# Patient Record
Sex: Male | Born: 1945
Health system: Southern US, Community
[De-identification: ages and names within clinical notes are randomized; demographics above are authoritative.]

## PROBLEM LIST (undated history)

## (undated) DIAGNOSIS — N433 Hydrocele, unspecified: Secondary | ICD-10-CM

## (undated) DIAGNOSIS — J439 Emphysema, unspecified: Secondary | ICD-10-CM

## (undated) DIAGNOSIS — K311 Adult hypertrophic pyloric stenosis: Secondary | ICD-10-CM

## (undated) DIAGNOSIS — I2510 Atherosclerotic heart disease of native coronary artery without angina pectoris: Secondary | ICD-10-CM

## (undated) DIAGNOSIS — I700 Atherosclerosis of aorta: Secondary | ICD-10-CM

## (undated) DIAGNOSIS — I712 Thoracic aortic aneurysm, without rupture: Secondary | ICD-10-CM

## (undated) DIAGNOSIS — E785 Hyperlipidemia, unspecified: Secondary | ICD-10-CM

## (undated) DIAGNOSIS — I499 Cardiac arrhythmia, unspecified: Secondary | ICD-10-CM

## (undated) DIAGNOSIS — I440 Atrioventricular block, first degree: Secondary | ICD-10-CM

## (undated) DIAGNOSIS — Z9289 Personal history of other medical treatment: Secondary | ICD-10-CM

## (undated) DIAGNOSIS — Z888 Allergy status to other drugs, medicaments and biological substances status: Secondary | ICD-10-CM

## (undated) DIAGNOSIS — K219 Gastro-esophageal reflux disease without esophagitis: Secondary | ICD-10-CM

## (undated) DIAGNOSIS — K5790 Diverticulosis of intestine, part unspecified, without perforation or abscess without bleeding: Secondary | ICD-10-CM

## (undated) DIAGNOSIS — I4891 Unspecified atrial fibrillation: Secondary | ICD-10-CM

## (undated) DIAGNOSIS — I480 Paroxysmal atrial fibrillation: Secondary | ICD-10-CM

## (undated) DIAGNOSIS — I219 Acute myocardial infarction, unspecified: Secondary | ICD-10-CM

## (undated) DIAGNOSIS — T4145XA Adverse effect of unspecified anesthetic, initial encounter: Secondary | ICD-10-CM

## (undated) DIAGNOSIS — I714 Abdominal aortic aneurysm, without rupture: Secondary | ICD-10-CM

## (undated) DIAGNOSIS — K3189 Other diseases of stomach and duodenum: Secondary | ICD-10-CM

## (undated) DIAGNOSIS — Z720 Tobacco use: Secondary | ICD-10-CM

## (undated) DIAGNOSIS — R911 Solitary pulmonary nodule: Secondary | ICD-10-CM

## (undated) DIAGNOSIS — C61 Malignant neoplasm of prostate: Secondary | ICD-10-CM

## (undated) DIAGNOSIS — K635 Polyp of colon: Secondary | ICD-10-CM

## (undated) HISTORY — DX: Atherosclerotic heart disease of native coronary artery without angina pectoris: I25.10

## (undated) HISTORY — DX: Hyperlipidemia, unspecified: E78.5

## (undated) HISTORY — DX: Unspecified atrial fibrillation: I48.91

## (undated) HISTORY — DX: Allergy status to other drugs, medicaments and biological substances: Z88.8

## (undated) HISTORY — DX: Hydrocele, unspecified: N43.3

## (undated) HISTORY — DX: Personal history of other medical treatment: Z92.89

## (undated) HISTORY — PX: TONSILLECTOMY: SUR1361

## (undated) HISTORY — DX: Paroxysmal atrial fibrillation: I48.0

## (undated) HISTORY — PX: CORONARY STENT PLACEMENT: SHX1402

## (undated) HISTORY — DX: Polyp of colon: K63.5

## (undated) HISTORY — DX: Tobacco use: Z72.0

---

## 2010-05-21 ENCOUNTER — Encounter: Payer: Self-pay | Admitting: Family Medicine

## 2010-07-14 HISTORY — PX: ATRIAL FIBRILLATION ABLATION: SHX5732

## 2010-08-13 HISTORY — PX: OTHER SURGICAL HISTORY: SHX169

## 2010-10-16 DIAGNOSIS — I259 Chronic ischemic heart disease, unspecified: Secondary | ICD-10-CM | POA: Insufficient documentation

## 2012-03-02 ENCOUNTER — Other Ambulatory Visit: Payer: Self-pay | Admitting: Family Medicine

## 2012-03-02 DIAGNOSIS — J989 Respiratory disorder, unspecified: Secondary | ICD-10-CM

## 2012-03-05 ENCOUNTER — Ambulatory Visit
Admission: RE | Admit: 2012-03-05 | Discharge: 2012-03-05 | Disposition: A | Discharge status: Home / Self Care | Payer: Medicare FFS | Source: Ambulatory Visit | Attending: Family Medicine | Admitting: Family Medicine

## 2012-03-05 DIAGNOSIS — J989 Respiratory disorder, unspecified: Secondary | ICD-10-CM

## 2012-05-12 ENCOUNTER — Other Ambulatory Visit: Payer: Self-pay | Admitting: *Deleted

## 2012-05-12 DIAGNOSIS — R9389 Abnormal findings on diagnostic imaging of other specified body structures: Secondary | ICD-10-CM

## 2012-05-31 NOTE — Telephone Encounter (Signed)
This encounter was created in error - please disregard.

## 2012-06-08 ENCOUNTER — Ambulatory Visit
Admission: RE | Admit: 2012-06-08 | Discharge: 2012-06-08 | Disposition: A | Discharge status: Home / Self Care | Payer: Medicare FFS | Source: Ambulatory Visit | Attending: Family Medicine | Admitting: Family Medicine

## 2012-06-08 DIAGNOSIS — R9389 Abnormal findings on diagnostic imaging of other specified body structures: Secondary | ICD-10-CM

## 2012-06-22 ENCOUNTER — Other Ambulatory Visit: Payer: Self-pay | Admitting: Family Medicine

## 2012-06-24 ENCOUNTER — Ambulatory Visit (INDEPENDENT_AMBULATORY_CARE_PROVIDER_SITE_OTHER): Payer: Medicare PPO | Admitting: Family Medicine

## 2012-06-24 ENCOUNTER — Encounter: Payer: Self-pay | Admitting: Family Medicine

## 2012-06-24 VITALS — BP 112/71 | HR 53 | Temp 97.9°F | Ht 68.5 in | Wt 169.2 lb

## 2012-06-24 DIAGNOSIS — R5383 Other fatigue: Secondary | ICD-10-CM

## 2012-06-24 DIAGNOSIS — E785 Hyperlipidemia, unspecified: Secondary | ICD-10-CM

## 2012-06-24 DIAGNOSIS — Z789 Other specified health status: Secondary | ICD-10-CM | POA: Insufficient documentation

## 2012-06-24 DIAGNOSIS — N400 Enlarged prostate without lower urinary tract symptoms: Secondary | ICD-10-CM

## 2012-06-24 DIAGNOSIS — N433 Hydrocele, unspecified: Secondary | ICD-10-CM

## 2012-06-24 DIAGNOSIS — I10 Essential (primary) hypertension: Secondary | ICD-10-CM

## 2012-06-24 DIAGNOSIS — I480 Paroxysmal atrial fibrillation: Secondary | ICD-10-CM | POA: Insufficient documentation

## 2012-06-24 LAB — HEPATIC FUNCTION PANEL
Albumin: 4.2 g/dL (ref 3.5–5.2)
Bilirubin, Direct: 0.1 mg/dL (ref 0.0–0.3)
Total Bilirubin: 0.4 mg/dL (ref 0.3–1.2)

## 2012-06-24 LAB — POCT CBC
Hemoglobin: 15 g/dL (ref 14.1–18.1)
Lymph, poc: 2.7 (ref 0.6–3.4)
MCH, POC: 32.6 pg — AB (ref 27–31.2)
MCV: 93 fL (ref 80–97)
Platelet Count, POC: 194 10*3/uL (ref 142–424)
RBC: 4.6 M/uL — AB (ref 4.69–6.13)

## 2012-06-24 LAB — POCT URINALYSIS DIPSTICK
Bilirubin, UA: NEGATIVE
Glucose, UA: NEGATIVE
Ketones, UA: NEGATIVE
Spec Grav, UA: 1.025
Urobilinogen, UA: NEGATIVE

## 2012-06-24 LAB — POCT UA - MICROSCOPIC ONLY

## 2012-06-24 LAB — LIPID PANEL
Cholesterol: 177 mg/dL (ref 0–200)
Total CHOL/HDL Ratio: 3.5 Ratio
VLDL: 16 mg/dL (ref 0–40)

## 2012-06-24 NOTE — Patient Instructions (Addendum)
Fall precautions discussed Continue current meds and therapeutic lifestyle changes We will schedule a visit with the urologist next door. We will plan to see you back in about 5 months.

## 2012-06-24 NOTE — Progress Notes (Signed)
  Subjective:    Patient ID: Dustin Bauer, male    DOB: 13-Aug-1945, 67 y.o.   MRN: [id number]  HPI Patient comes in today for followup of hyperlipidemia with statin intolerance, ASCVD, and BPH. Had PSA done in October of 2013. The value was 2.40 . Next colonoscopy will be due in June of 2018 due to family history of colon cancer. He will be seeing Dr. Orson Aloe his cardiologist sometime in July. He never was able to see the urologist regarding the hydrocele. He is not having quite as much problems with this, but we will go on and schedule one visit for followup and evaluation.   Review of Systems  Constitutional: Positive for fatigue.  HENT: Positive for postnasal drip (slight).   Respiratory: Positive for cough (dry, occasional). Negative for shortness of breath.   Cardiovascular: Negative.   Gastrointestinal: Negative.   Genitourinary: Negative.   Musculoskeletal: Positive for arthralgias. Gait problem: R elbow.  Allergic/Immunologic: Positive for environmental allergies (seasonal).  Psychiatric/Behavioral: Positive for sleep disturbance (trouble getting to sleep).       Objective:   Physical Exam BP 112/71  Pulse 53  Temp(Src) 97.9 F (36.6 C) (Oral)  Ht 5' 8.5" (1.74 m)  Wt 169 lb 3.2 oz (76.749 kg)  BMI 25.35 kg/m2  The patient appeared well nourished and normally developed, alert and oriented to time and place. Speech, behavior and judgement appear normal. Vital signs as documented.  Head exam is unremarkable. No scleral icterus or pallor noted. There is  some nasal congestion bilaterally. Throat mouth and ears all normal Neck is without jugular venous distension, thyromegally, or carotid bruits. Carotid upstrokes are brisk bilaterally. No cervical adenopathy. Lungs are clear anteriorly and posteriorly to auscultation. Normal respiratory effort. No axillary adenopathy. Cardiac exam reveals regular rate and rhythm at 60 per minute. First and second heart sounds normal.  No  murmurs, rubs or gallops.  Abdominal exam reveals normal bowl sounds, no masses, no organomegaly and no aortic enlargement. No inguinal adenopathy. There is no tenderness. Extremities are nonedematous and both femoral  are normal. Skin without pallor or jaundice.  Warm and dry, without rash. There is some bruising Neurologic exam reveals normal deep tendon reflexes and normal sensation.          Assessment & Plan:  1. Hyperlipemia - Hepatic function panel; Standing - Lipid panel  2. BPH (benign prostatic hypertrophy) - POCT urinalysis dipstick - POCT UA - Microscopic Only  3. Fatigue - POCT CBC; Standing - Vitamin D 25 hydroxy; Standing  4. Hypertension - POCT CBC; Standing - BASIC METABOLIC PANEL WITH GFR; Standing  5.[place] - Ambulatory referral to Urology  Patient Instructions  Fall precautions discussed Continue current meds and therapeutic lifestyle changes We will schedule a visit with the urologist next door. We will plan to see you back in about 5 months.   Nyra Capes MD

## 2012-06-25 ENCOUNTER — Telehealth: Payer: Self-pay | Admitting: Family Medicine

## 2012-06-25 LAB — BASIC METABOLIC PANEL WITH GFR
BUN: 18 mg/dL (ref 6–23)
CO2: 27 mEq/L (ref 19–32)
Chloride: 102 mEq/L (ref 96–112)
Creat: 1.05 mg/dL (ref 0.50–1.35)
GFR, Est Non African American: 73 mL/min
Glucose, Bld: 98 mg/dL (ref 70–99)

## 2012-06-25 LAB — VITAMIN D 25 HYDROXY (VIT D DEFICIENCY, FRACTURES): Vit D, 25-Hydroxy: 36 ng/mL (ref 30–89)

## 2012-06-29 NOTE — Telephone Encounter (Signed)
Pt notified of lab results and would wait on scheduling appt with tammy

## 2012-11-29 ENCOUNTER — Encounter: Payer: Self-pay | Admitting: Family Medicine

## 2012-11-29 ENCOUNTER — Encounter (INDEPENDENT_AMBULATORY_CARE_PROVIDER_SITE_OTHER): Payer: Self-pay

## 2012-11-29 ENCOUNTER — Ambulatory Visit (INDEPENDENT_AMBULATORY_CARE_PROVIDER_SITE_OTHER): Payer: Medicare PPO | Admitting: Family Medicine

## 2012-11-29 VITALS — BP 130/75 | HR 49 | Temp 96.8°F | Ht 68.5 in | Wt 171.2 lb

## 2012-11-29 DIAGNOSIS — Z23 Encounter for immunization: Secondary | ICD-10-CM

## 2012-11-29 DIAGNOSIS — I10 Essential (primary) hypertension: Secondary | ICD-10-CM

## 2012-11-29 DIAGNOSIS — E559 Vitamin D deficiency, unspecified: Secondary | ICD-10-CM

## 2012-11-29 DIAGNOSIS — E785 Hyperlipidemia, unspecified: Secondary | ICD-10-CM

## 2012-11-29 DIAGNOSIS — N400 Enlarged prostate without lower urinary tract symptoms: Secondary | ICD-10-CM

## 2012-11-29 DIAGNOSIS — R9389 Abnormal findings on diagnostic imaging of other specified body structures: Secondary | ICD-10-CM

## 2012-11-29 DIAGNOSIS — Z0000 Encounter for general adult medical examination without abnormal findings: Secondary | ICD-10-CM

## 2012-11-29 DIAGNOSIS — R5381 Other malaise: Secondary | ICD-10-CM

## 2012-11-29 DIAGNOSIS — R5383 Other fatigue: Secondary | ICD-10-CM

## 2012-11-29 DIAGNOSIS — N433 Hydrocele, unspecified: Secondary | ICD-10-CM

## 2012-11-29 LAB — POCT CBC
Granulocyte percent: 62.1 %G (ref 37–80)
HCT, POC: 45.6 % (ref 43.5–53.7)
Lymph, poc: 2.5 (ref 0.6–3.4)
MCHC: 33.8 g/dL (ref 31.8–35.4)
POC Granulocyte: 4.8 (ref 2–6.9)
POC LYMPH PERCENT: 32.4 %L (ref 10–50)
Platelet Count, POC: 202 10*3/uL (ref 142–424)
RDW, POC: 14.3 %

## 2012-11-29 NOTE — Patient Instructions (Addendum)
Continue current medications. Continue good therapeutic lifestyle changes which include good diet and exercise. Fall precautions discussed with patient. Schedule your flu vaccine if you haven't had it yet If you are over 67 years old - you may need Prevnar 13 or the adult Pneumonia vaccine. Return FOBT.Pneumococcal Vaccine, Polyvalent suspension for injection What is this medicine? PNEUMOCOCCAL VACCINE, POLYVALENT (NEU mo KOK al vak SEEN, pol ee VEY luhnt) is a vaccine to prevent pneumococcus bacteria infection. These bacteria are a major cause of ear infections, 'Strep throat' infections, and serious pneumonia, meningitis, or blood infections worldwide. These vaccines help the body to produce antibodies (protective substances) that help your body defend against these bacteria. This vaccine is recommended for infants and young children. This vaccine will not treat an infection. This medicine may be used for other purposes; ask your health care provider or pharmacist if you have questions. COMMON BRAND NAME(S): Prevnar 13 , Prevnar What should I tell my health care provider before I take this medicine? They need to know if you have any of these conditions: -bleeding problems -fever -immune system problems -low platelet count in the blood -seizures -an unusual or allergic reaction to pneumococcal vaccine, diphtheria toxoid, other vaccines, latex, other medicines, foods, dyes, or preservatives -pregnant or trying to get pregnant -breast-feeding How should I use this medicine? This vaccine is for injection into a muscle. It is given by a health care professional. A copy of Vaccine Information Statements will be given before each vaccination. Read this sheet carefully each time. The sheet may change frequently. Talk to your pediatrician regarding the use of this medicine in children. While this drug may be prescribed for children as young as 42 weeks old for selected conditions, precautions do  apply. Overdosage: If you think you have taken too much of this medicine contact a poison control center or emergency room at once. NOTE: This medicine is only for you. Do not share this medicine with others. What if I miss a dose? It is important not to miss your dose. Call your doctor or health care professional if you are unable to keep an appointment. What may interact with this medicine? -medicines for cancer chemotherapy -medicines that suppress your immune function -medicines that treat or prevent blood clots like warfarin, enoxaparin, and dalteparin -steroid medicines like prednisone or cortisone This list may not describe all possible interactions. Give your health care provider a list of all the medicines, herbs, non-prescription drugs, or dietary supplements you use. Also tell them if you smoke, drink alcohol, or use illegal drugs. Some items may interact with your medicine. What should I watch for while using this medicine? Mild fever and pain should go away in 3 days or less. Report any unusual symptoms to your doctor or health care professional. What side effects may I notice from receiving this medicine? Side effects that you should report to your doctor or health care professional as soon as possible: -allergic reactions like skin rash, itching or hives, swelling of the face, lips, or tongue -breathing problems -confused -fever over 102 degrees F -pain, tingling, numbness in the hands or feet -seizures -unusual bleeding or bruising -unusual muscle weakness Side effects that usually do not require medical attention (report to your doctor or health care professional if they continue or are bothersome): -aches and pains -diarrhea -fever of 102 degrees F or less -headache -irritable -loss of appetite -pain, tender at site where injected -trouble sleeping This list may not describe all possible side effects. Call  your doctor for medical advice about side effects. You may  report side effects to FDA at 1-800-FDA-1088. Where should I keep my medicine? This does not apply. This vaccine is given in a clinic, pharmacy, doctor's office, or other health care setting and will not be stored at home. NOTE: This sheet is a summary. It may not cover all possible information. If you have questions about this medicine, talk to your doctor, pharmacist, or health care provider.  2014, Elsevier/Gold Standard. (2008-03-14 10:17:22) Influenza Virus Vaccine injection What is this medicine? INFLUENZA VIRUS VACCINE (in floo EN zuh VAHY ruhs vak SEEN) helps to reduce the risk of getting influenza also known as the flu. The vaccine only helps protect you against some strains of the flu. This medicine may be used for other purposes; ask your health care provider or pharmacist if you have questions. COMMON BRAND NAME(S): Afluria , Agriflu, Fluarix Quadrivalent, Fluarix, FLUCELVAX, Flulaval, Fluvirin, Fluzone High-Dose, Fluzone Intradermal, Fluzone What should I tell my health care provider before I take this medicine? They need to know if you have any of these conditions: -bleeding disorder like hemophilia -fever or infection -Guillain-Barre syndrome or other neurological problems -immune system problems -infection with the human immunodeficiency virus (HIV) or AIDS -low blood platelet counts -multiple sclerosis -an unusual or allergic reaction to influenza virus vaccine, latex, other medicines, foods, dyes, or preservatives. Different brands of vaccines contain different allergens. Some may contain latex or eggs. Talk to your doctor about your allergies to make sure that you get the right vaccine. -pregnant or trying to get pregnant -breast-feeding How should I use this medicine? This vaccine is for injection into a muscle or under the skin. It is given by a health care professional. A copy of Vaccine Information Statements will be given before each vaccination. Read this sheet  carefully each time. The sheet may change frequently. Talk to your healthcare provider to see which vaccines are right for you. Some vaccines should not be used in all age groups. Overdosage: If you think you have taken too much of this medicine contact a poison control center or emergency room at once. NOTE: This medicine is only for you. Do not share this medicine with others. What if I miss a dose? This does not apply. What may interact with this medicine? -chemotherapy or radiation therapy -medicines that lower your immune system like etanercept, anakinra, infliximab, and adalimumab -medicines that treat or prevent blood clots like warfarin -phenytoin -steroid medicines like prednisone or cortisone -theophylline -vaccines This list may not describe all possible interactions. Give your health care provider a list of all the medicines, herbs, non-prescription drugs, or dietary supplements you use. Also tell them if you smoke, drink alcohol, or use illegal drugs. Some items may interact with your medicine. What should I watch for while using this medicine? Report any side effects that do not go away within 3 days to your doctor or health care professional. Call your health care provider if any unusual symptoms occur within 6 weeks of receiving this vaccine. You may still catch the flu, but the illness is not usually as bad. You cannot get the flu from the vaccine. The vaccine will not protect against colds or other illnesses that may cause fever. The vaccine is needed every year. What side effects may I notice from receiving this medicine? Side effects that you should report to your doctor or health care professional as soon as possible: -allergic reactions like skin rash, itching or hives, swelling  of the face, lips, or tongue Side effects that usually do not require medical attention (report to your doctor or health care professional if they continue or are  bothersome): -fever -headache -muscle aches and pains -pain, tenderness, redness, or swelling at the injection site -tiredness This list may not describe all possible side effects. Call your doctor for medical advice about side effects. You may report side effects to FDA at 1-800-FDA-1088. Where should I keep my medicine? The vaccine will be given by a health care professional in a clinic, pharmacy, doctor's office, or other health care setting. You will not be given vaccine doses to store at home. NOTE: This sheet is a summary. It may not cover all possible information. If you have questions about this medicine, talk to your doctor, pharmacist, or health care provider.  2014, Elsevier/Gold Standard. (2011-07-10 04:54:09)

## 2012-11-29 NOTE — Addendum Note (Signed)
Addended by: Prescott Gum on: 11/29/2012 04:39 PM   Modules accepted: Orders

## 2012-11-29 NOTE — Addendum Note (Signed)
Addended by: Prescott Gum on: 11/29/2012 04:33 PM   Modules accepted: Orders

## 2012-11-29 NOTE — Addendum Note (Signed)
Addended by: Bearl Mulberry on: 11/29/2012 04:34 PM   Modules accepted: Orders

## 2012-11-29 NOTE — Progress Notes (Signed)
Subjective:    Patient ID: Dustin OROURKE, male    DOB: 02-Jan-1946, 67 y.o.   MRN: [id number]  HPI Pt here for follow up and management of chronic medical problems. He is also here for his annual exam. He is still followed by a cardiologist at [facility]. He also is being followed for an abnormal chest CT and this scan will be repeated in June of 2015.   Review of Systems  Constitutional: Positive for fatigue (slight). Negative for activity change and appetite change.  HENT: Negative for congestion, postnasal drip, rhinorrhea and sinus pressure.   Eyes: Positive for redness (intermitent) and itching (intermitent).  Respiratory: Negative for cough, shortness of breath and wheezing.   Cardiovascular: Negative for chest pain, palpitations and leg swelling.  Gastrointestinal: Negative for abdominal pain, diarrhea, constipation and blood in stool.  Endocrine: Negative.  Negative for cold intolerance, heat intolerance and polyuria.  Genitourinary: Negative for dysuria, urgency and frequency.  Musculoskeletal: Positive for arthralgias (occasional). Negative for back pain and myalgias.  Allergic/Immunologic: Positive for environmental allergies.  Neurological: Positive for light-headedness (occasional with positioning). Negative for dizziness, tremors, weakness and headaches.  Psychiatric/Behavioral: Negative for confusion, sleep disturbance and decreased concentration. The patient is not nervous/anxious.        Objective:   Physical Exam  Nursing note and vitals reviewed. Constitutional: He is oriented to person, place, and time. He appears well-developed and well-nourished. No distress.  HENT:  Head: Normocephalic and atraumatic.  Right Ear: External ear normal.  Left Ear: External ear normal.  Nose: Nose normal.  Mouth/Throat: Oropharynx is clear and moist. No oropharyngeal exudate.  Nasal congestion bilaterally  Eyes: Conjunctivae and EOM are normal. Pupils are  equal, round, and reactive to light. Right eye exhibits no discharge. Left eye exhibits no discharge. No scleral icterus.  Neck: Normal range of motion. Neck supple. No tracheal deviation present. No thyromegaly present.  No carotid bruits  Cardiovascular: Normal rate, regular rhythm, normal heart sounds and intact distal pulses.  Exam reveals no gallop and no friction rub.   No murmur heard. At 50-60 per minute  Pulmonary/Chest: Effort normal and breath sounds normal. No respiratory distress. He has no wheezes. He has no rales. He exhibits no tenderness.  Abdominal: Soft. Bowel sounds are normal. He exhibits no mass. There is no tenderness. There is no rebound and no guarding.  Genitourinary: Rectum normal and penis normal.   Prostate is enlarged and smooth without any lumps. There no rectal masses. There is a left hydrocele.  Musculoskeletal: Normal range of motion. He exhibits no edema and no tenderness.  Lymphadenopathy:    He has no cervical adenopathy.  Neurological: He is alert and oriented to person, place, and time. He has normal reflexes. No cranial nerve deficit.  Skin: Skin is warm and dry. No rash noted. No erythema. No pallor.  There are some AK's on the face and he has a small skin tear on the left hand   Psychiatric: He has a normal mood and affect. His behavior is normal. Judgment and thought content normal.   BP 130/75  Pulse 49  Temp(Src) 96.8 F (36 C) (Oral)  Ht 5' 8.5" (1.74 m)  Wt 171 lb 3.2 oz (77.656 kg)  BMI 25.65 kg/m2        Assessment & Plan:   1. Hyperlipemia   2. BPH (benign prostatic hypertrophy)   3. Hypertension   4. Hydrocele   5. Abnormal chest CT  Patient will have all labs drawn today plus a urinalysis  Meds ordered this encounter  Medications  . ketoconazole (NIZORAL) 2 % cream    Sig:   . NITROSTAT 0.4 MG SL tablet    Sig:    Patient Instructions  Continue current medications. Continue good therapeutic lifestyle changes which  include good diet and exercise. Fall precautions discussed with patient. Schedule your flu vaccine if you haven't had it yet If you are over 91 years old - you may need Prevnar 13 or the adult Pneumonia vaccine. Return FOBT.   Remember that you're next chest CT is reviewed in late May of 2015  Nyra Capes MD

## 2012-11-30 LAB — BMP8+EGFR
CO2: 26 mmol/L (ref 18–29)
Chloride: 101 mmol/L (ref 97–108)
GFR calc Af Amer: 89 mL/min/{1.73_m2} (ref >59)
Glucose: 90 mg/dL (ref 65–99)
Potassium: 4.5 mmol/L (ref 3.5–5.2)

## 2012-11-30 LAB — PSA, TOTAL AND FREE
PSA, Free Pct: 16.2 %
PSA, Free: 0.55 ng/mL
PSA: 3.4 ng/mL (ref 0.0–4.0)

## 2012-11-30 LAB — HEPATIC FUNCTION PANEL
AST: 22 IU/L (ref 0–40)
Alkaline Phosphatase: 83 IU/L (ref 39–117)
Total Protein: 6.5 g/dL (ref 6.0–8.5)

## 2012-11-30 LAB — LIPID PANEL
Cholesterol, Total: 176 mg/dL (ref 100–199)
LDL Calculated: 108 mg/dL — ABNORMAL HIGH (ref 0–99)
Triglycerides: 84 mg/dL (ref 0–149)

## 2012-11-30 LAB — VITAMIN D 25 HYDROXY (VIT D DEFICIENCY, FRACTURES): Vit D, 25-Hydroxy: 25.2 ng/mL — ABNORMAL LOW (ref 30.0–100.0)

## 2012-12-15 ENCOUNTER — Telehealth: Payer: Self-pay | Admitting: *Deleted

## 2012-12-15 NOTE — Telephone Encounter (Signed)
PSA this time was 3.4. A year ago it was 2.4. This is more than 0.75 rise. Repeat PSA in one week and get urinalysis to verify the rate of rise and to make sure there is no sign of any urinary tract infection

## 2012-12-15 NOTE — Telephone Encounter (Signed)
Message copied by Magdalene River on Wed Dec 15, 2012 11:19 AM ------      Message from: Ernestina Penna      Created: Sat Dec 04, 2012  8:01 AM       Make sure that the patient is aware of these results and that we confirm what the previous PSA was from the previous drawl in the paper chart      ----- Message -----         From: SYSTEM         Sent: 12/04/2012  12:10 AM           To: Ernestina Penna, MD                   ------

## 2012-12-15 NOTE — Telephone Encounter (Signed)
Message copied by Baltazar Apo on Wed Dec 15, 2012  3:59 PM ------      Message from: Ernestina Penna      Created: Tue Nov 30, 2012  2:12 PM       The PSA is 3.4-------- please check the paper chart for the previous PSA reading       on a traditional lipid panel the LDL C. Was 108, 5 months ago it was 110,this number should be less than 100. Triglycerides were good at 84 the HDL was the same as it was 5 months ago at 51      Blood sugar renal and electrolytes were all good      LFTs were within normal limit      Vitamin D was low at 25.2. Start vitamin D 50,000 units weekly for 12 weeks with 1 refill, recheck vitamin D in 3 months ------

## 2012-12-15 NOTE — Telephone Encounter (Signed)
Pt notified and verbalized understanding.

## 2012-12-15 NOTE — Telephone Encounter (Signed)
Last PSA was done 11-06-11 and it was 2.40 DWM to address

## 2012-12-16 ENCOUNTER — Other Ambulatory Visit (INDEPENDENT_AMBULATORY_CARE_PROVIDER_SITE_OTHER): Payer: Medicare PPO

## 2012-12-16 DIAGNOSIS — N390 Urinary tract infection, site not specified: Secondary | ICD-10-CM

## 2012-12-16 DIAGNOSIS — Z125 Encounter for screening for malignant neoplasm of prostate: Secondary | ICD-10-CM

## 2012-12-16 LAB — POCT URINALYSIS DIPSTICK
Bilirubin, UA: NEGATIVE
Nitrite, UA: NEGATIVE
Protein, UA: NEGATIVE
pH, UA: 5

## 2012-12-16 LAB — POCT UA - MICROSCOPIC ONLY
Casts, Ur, LPF, POC: NEGATIVE
Yeast, UA: NEGATIVE

## 2012-12-16 NOTE — Addendum Note (Signed)
Addended by: Prescott Gum on: 12/16/2012 04:11 PM   Modules accepted: Orders

## 2012-12-16 NOTE — Progress Notes (Signed)
Pt came in for labs only 

## 2012-12-17 LAB — PSA, TOTAL AND FREE
PSA, Free Pct: 11.4 %
PSA, Free: 0.4 ng/mL
PSA: 3.5 ng/mL (ref 0.0–4.0)

## 2012-12-20 ENCOUNTER — Other Ambulatory Visit: Payer: Self-pay

## 2012-12-20 DIAGNOSIS — R972 Elevated prostate specific antigen [PSA]: Secondary | ICD-10-CM

## 2012-12-22 ENCOUNTER — Telehealth: Payer: Self-pay | Admitting: Family Medicine

## 2013-04-19 ENCOUNTER — Telehealth: Payer: Self-pay | Admitting: Family Medicine

## 2013-04-19 DIAGNOSIS — M25521 Pain in right elbow: Secondary | ICD-10-CM

## 2013-04-19 NOTE — Telephone Encounter (Signed)
Please take care of this referral note for this patient and call him and let him know this has been done

## 2013-04-20 NOTE — Telephone Encounter (Signed)
Pt aware referral has been placed.

## 2013-05-30 ENCOUNTER — Ambulatory Visit (INDEPENDENT_AMBULATORY_CARE_PROVIDER_SITE_OTHER): Payer: Medicare PPO | Admitting: Family Medicine

## 2013-05-30 ENCOUNTER — Encounter: Payer: Self-pay | Admitting: Family Medicine

## 2013-05-30 VITALS — BP 126/77 | HR 58 | Temp 98.3°F | Ht 68.5 in | Wt 170.0 lb

## 2013-05-30 DIAGNOSIS — I10 Essential (primary) hypertension: Secondary | ICD-10-CM

## 2013-05-30 DIAGNOSIS — R9389 Abnormal findings on diagnostic imaging of other specified body structures: Secondary | ICD-10-CM

## 2013-05-30 DIAGNOSIS — I4891 Unspecified atrial fibrillation: Secondary | ICD-10-CM

## 2013-05-30 DIAGNOSIS — E559 Vitamin D deficiency, unspecified: Secondary | ICD-10-CM

## 2013-05-30 DIAGNOSIS — R972 Elevated prostate specific antigen [PSA]: Secondary | ICD-10-CM

## 2013-05-30 DIAGNOSIS — E785 Hyperlipidemia, unspecified: Secondary | ICD-10-CM

## 2013-05-30 DIAGNOSIS — I7090 Unspecified atherosclerosis: Secondary | ICD-10-CM

## 2013-05-30 DIAGNOSIS — I2510 Atherosclerotic heart disease of native coronary artery without angina pectoris: Secondary | ICD-10-CM

## 2013-05-30 DIAGNOSIS — R7989 Other specified abnormal findings of blood chemistry: Secondary | ICD-10-CM

## 2013-05-30 LAB — POCT CBC
Granulocyte percent: 69.1 %G (ref 37–80)
HCT, POC: 44.2 % (ref 43.5–53.7)
Hemoglobin: 13.7 g/dL — AB (ref 14.1–18.1)
LYMPH, POC: 2.4 (ref 0.6–3.4)
MCH: 29.3 pg (ref 27–31.2)
MCHC: 31 g/dL — AB (ref 31.8–35.4)
MCV: 94.4 fL (ref 80–97)
MPV: 7.6 fL (ref 0–99.8)
POC GRANULOCYTE: 6.4 (ref 2–6.9)
POC LYMPH %: 26.4 % (ref 10–50)
Platelet Count, POC: 197 10*3/uL (ref 142–424)
RBC: 4.7 M/uL (ref 4.69–6.13)
RDW, POC: 14.7 %
WBC: 9.2 10*3/uL (ref 4.6–10.2)

## 2013-05-30 MED ORDER — HYDROCHLOROTHIAZIDE 12.5 MG PO CAPS: 12.5000 mg | ORAL_CAPSULE | Freq: Every day | ORAL | Status: DC

## 2013-05-30 MED ORDER — EZETIMIBE 10 MG PO TABS: ORAL_TABLET | ORAL | Status: DC

## 2013-05-30 NOTE — Addendum Note (Signed)
Addended by: Earlene Plater on: 05/30/2013 05:15 PM   Modules accepted: Orders

## 2013-05-30 NOTE — Patient Instructions (Addendum)
Medicare Annual Wellness Visit  [facility] and the medical providers at [facility] strive to bring you the best medical care.  In doing so we not only want to address your current medical conditions and concerns but also to detect new conditions early and prevent illness, disease and health-related problems.    Medicare offers a yearly Wellness Visit which allows our clinical staff to assess your need for preventative services including immunizations, lifestyle education, counseling to decrease risk of preventable diseases and screening for fall risk and other medical concerns.    This visit is provided free of charge (no copay) for all Medicare recipients. The clinical pharmacists at [facility] have begun to conduct these Wellness Visits which will also include a thorough review of all your medications.    As you primary medical provider recommend that you make an appointment for your Annual Wellness Visit if you have not done so already this year.  You may set up this appointment before you leave today or you may call back ([phone]) and schedule an appointment.  Please make sure when you call that you mention that you are scheduling your Annual Wellness Visit with the clinical pharmacist so that the appointment may be made for the proper length of time.       Continue current medications. Continue good therapeutic lifestyle changes which include good diet and exercise. Fall precautions discussed with patient. If an FOBT was given today- please return it to our front desk. If you are over 58 years old - you may need Prevnar 46 or the adult Pneumonia vaccine.  Keep followup appointment with urologist and cardiologist Return the FOBT will call other lab work once those results are available We will also schedule you for your repeat CT scan of the lungs because of the history of pulmonary nodule

## 2013-05-30 NOTE — Progress Notes (Signed)
Subjective:    Patient ID: Dustin Bauer, male    DOB: 1945-06-12, 68 y.o.   MRN: [id number]  HPI Pt here for follow up and management of chronic medical problems. The patient is doing well overall. He is due to get a followup CT scan of his lungs because of a recent one year ago abnormal CT scan with a pulmonary nodule. He also is followed by the urologist because of an elevation of his PSA from one year to the next. He'll get his blood work done today. He will need to return an FOBT.         Patient Active Problem List   Diagnosis Date Noted  . Abnormal chest CT 05/30/2013  . Elevated PSA 05/30/2013  . ASCVD (arteriosclerotic cardiovascular disease) 06/24/2012  . Hyperlipidemia 06/24/2012  . Atrial fibrillation,history of 06/24/2012  . Statin intolerance 06/24/2012   Outpatient Encounter Prescriptions as of 05/30/2013  Medication Sig  . aspirin (SB LOW DOSE ASA EC) 81 MG EC tablet Take 162 mg by mouth daily.   Marland Kitchen ezetimibe (ZETIA) 10 MG tablet TAKE 1 TABLETS ONCE A DAY  . hydrochlorothiazide (MICROZIDE) 12.5 MG capsule Take 1 capsule (12.5 mg total) by mouth daily.  . [DISCONTINUED] hydrochlorothiazide (MICROZIDE) 12.5 MG capsule Take 12.5 mg by mouth daily.  . [DISCONTINUED] ZETIA 10 MG tablet TAKE 1 TABLETS ONCE A DAY  . ketoconazole (NIZORAL) 2 % cream   . NITROSTAT 0.4 MG SL tablet     Review of Systems  Constitutional: Negative.   HENT: Negative.   Eyes: Negative.   Respiratory: Negative.   Cardiovascular: Negative.   Gastrointestinal: Negative.   Endocrine: Negative.   Genitourinary: Negative.   Musculoskeletal: Negative.   Skin: Negative.   Allergic/Immunologic: Negative.   Neurological: Negative.   Hematological: Negative.   Psychiatric/Behavioral: Negative.        Objective:   Physical Exam  Nursing note and vitals reviewed. Constitutional: He is oriented to person, place, and time. He appears well-developed and well-nourished. No distress.  HENT:    Head: Normocephalic and atraumatic.  Right Ear: External ear normal.  Left Ear: External ear normal.  Mouth/Throat: Oropharynx is clear and moist. No oropharyngeal exudate.  Nasal congestion bilaterally  Eyes: Conjunctivae and EOM are normal. Pupils are equal, round, and reactive to light. Right eye exhibits no discharge. Left eye exhibits no discharge. No scleral icterus.  Neck: Normal range of motion. Neck supple. No thyromegaly present.  Cardiovascular: Normal rate, regular rhythm, normal heart sounds and intact distal pulses.   No murmur heard. Rhythm is regular at 60 per minute  Pulmonary/Chest: Effort normal and breath sounds normal. No respiratory distress. He has no wheezes. He has no rales. He exhibits no tenderness.  Abdominal: Soft. Bowel sounds are normal. He exhibits no mass. There is no tenderness. There is no rebound and no guarding.  Genitourinary:  This is currently being followed by Dr. Karsten Ro, the urologist  Musculoskeletal: Normal range of motion. He exhibits no edema.  Lymphadenopathy:    He has no cervical adenopathy.  Neurological: He is alert and oriented to person, place, and time. He has normal reflexes. No cranial nerve deficit.  Skin: Skin is warm and dry. No rash noted. No erythema. No pallor.  Psychiatric: He has a normal mood and affect. His behavior is normal. Judgment and thought content normal.   BP 126/77  Pulse 58  Temp(Src) 98.3 F (36.8 C) (Oral)  Ht 5' 8.5" (1.74 m)  Wt 170  lb (77.111 kg)  BMI 25.47 kg/m2        Assessment & Plan:  1. ASCVD (arteriosclerotic cardiovascular disease) - POCT CBC  2. Hyperlipidemia - POCT CBC - Lipid panel  3. HTN (hypertension) - BMP8+EGFR - Hepatic function panel  4. Vitamin D deficiency - Vit D  25 hydroxy (rtn osteoporosis monitoring)  5. Abnormal chest CT -Repeat CT scan will be done this month  6. Atrial fibrillation,history of  7. Elevated PSA -Patient understands that he will followup  with the urologist because of this history  Patient Instructions                       Medicare Annual Wellness Visit  [facility] and the medical providers at [facility] strive to bring you the best medical care.  In doing so we not only want to address your current medical conditions and concerns but also to detect new conditions early and prevent illness, disease and health-related problems.    Medicare offers a yearly Wellness Visit which allows our clinical staff to assess your need for preventative services including immunizations, lifestyle education, counseling to decrease risk of preventable diseases and screening for fall risk and other medical concerns.    This visit is provided free of charge (no copay) for all Medicare recipients. The clinical pharmacists at [facility] have begun to conduct these Wellness Visits which will also include a thorough review of all your medications.    As you primary medical provider recommend that you make an appointment for your Annual Wellness Visit if you have not done so already this year.  You may set up this appointment before you leave today or you may call back ([phone]) and schedule an appointment.  Please make sure when you call that you mention that you are scheduling your Annual Wellness Visit with the clinical pharmacist so that the appointment may be made for the proper length of time.       Continue current medications. Continue good therapeutic lifestyle changes which include good diet and exercise. Fall precautions discussed with patient. If an FOBT was given today- please return it to our front desk. If you are over 14 years old - you may need Prevnar 25 or the adult Pneumonia vaccine.  Keep followup appointment with urologist and cardiologist Return the FOBT will call other lab work once those results are available We will also schedule you for your repeat CT scan of the lungs  because of the history of pulmonary nodule    Arrie Senate MD

## 2013-05-31 LAB — BMP8+EGFR
BUN/Creatinine Ratio: 18 (ref 10–22)
BUN: 18 mg/dL (ref 8–27)
CALCIUM: 9.5 mg/dL (ref 8.6–10.2)
CHLORIDE: 103 mmol/L (ref 97–108)
CO2: 24 mmol/L (ref 18–29)
Creatinine, Ser: 1.02 mg/dL (ref 0.76–1.27)
GFR calc Af Amer: 87 mL/min/{1.73_m2} (ref >59)
GFR, EST NON AFRICAN AMERICAN: 75 mL/min/{1.73_m2} (ref >59)
GLUCOSE: 90 mg/dL (ref 65–99)
POTASSIUM: 4.3 mmol/L (ref 3.5–5.2)
SODIUM: 142 mmol/L (ref 134–144)

## 2013-05-31 LAB — HEPATIC FUNCTION PANEL
ALK PHOS: 82 IU/L (ref 39–117)
ALT: 11 IU/L (ref 0–44)
AST: 15 IU/L (ref 0–40)
Albumin: 4.2 g/dL (ref 3.6–4.8)
BILIRUBIN DIRECT: 0.09 mg/dL (ref 0.00–0.40)
TOTAL PROTEIN: 6 g/dL (ref 6.0–8.5)
Total Bilirubin: 0.3 mg/dL (ref 0.0–1.2)

## 2013-05-31 LAB — CBC WITH DIFFERENTIAL
Basophils Absolute: 0 10*3/uL (ref 0.0–0.2)
Basos: 0 %
EOS: 4 %
Eosinophils Absolute: 0.4 10*3/uL (ref 0.0–0.4)
HEMATOCRIT: 41.5 % (ref 37.5–51.0)
Hemoglobin: 13.9 g/dL (ref 12.6–17.7)
Immature Grans (Abs): 0 10*3/uL (ref 0.0–0.1)
Immature Granulocytes: 0 %
LYMPHS ABS: 2.4 10*3/uL (ref 0.7–3.1)
Lymphs: 28 %
MCH: 31.3 pg (ref 26.6–33.0)
MCHC: 33.5 g/dL (ref 31.5–35.7)
MCV: 94 fL (ref 79–97)
MONOCYTES: 8 %
MONOS ABS: 0.7 10*3/uL (ref 0.1–0.9)
NEUTROS ABS: 5.3 10*3/uL (ref 1.4–7.0)
Neutrophils Relative %: 60 %
Platelets: 204 10*3/uL (ref 150–379)
RBC: 4.44 x10E6/uL (ref 4.14–5.80)
RDW: 14.8 % (ref 12.3–15.4)
WBC: 8.8 10*3/uL (ref 3.4–10.8)

## 2013-05-31 LAB — LIPID PANEL
CHOLESTEROL TOTAL: 157 mg/dL (ref 100–199)
Chol/HDL Ratio: 3.3 ratio units (ref 0.0–5.0)
HDL: 48 mg/dL (ref >39)
LDL Calculated: 87 mg/dL (ref 0–99)
Triglycerides: 108 mg/dL (ref 0–149)
VLDL CHOLESTEROL CAL: 22 mg/dL (ref 5–40)

## 2013-05-31 LAB — VITAMIN D 25 HYDROXY (VIT D DEFICIENCY, FRACTURES): VIT D 25 HYDROXY: 36.9 ng/mL (ref 30.0–100.0)

## 2013-06-05 ENCOUNTER — Other Ambulatory Visit: Payer: Self-pay | Admitting: Family Medicine

## 2013-07-05 ENCOUNTER — Other Ambulatory Visit: Payer: Self-pay

## 2013-07-05 DIAGNOSIS — R911 Solitary pulmonary nodule: Secondary | ICD-10-CM

## 2013-07-15 ENCOUNTER — Encounter (HOSPITAL_COMMUNITY): Payer: Self-pay | Admitting: Emergency Medicine

## 2013-07-15 ENCOUNTER — Inpatient Hospital Stay (HOSPITAL_COMMUNITY)
Admission: EM | Admit: 2013-07-15 | Discharge: 2013-07-17 | DRG: 246 | Disposition: A | Discharge status: Home / Self Care | Payer: Medicare FFS | Attending: Cardiology | Admitting: Cardiology

## 2013-07-15 ENCOUNTER — Encounter (HOSPITAL_COMMUNITY)
Admission: EM | Disposition: A | Discharge status: Home / Self Care | Payer: Medicare FFS | Source: Home / Self Care | Attending: Cardiology

## 2013-07-15 DIAGNOSIS — Z79899 Other long term (current) drug therapy: Secondary | ICD-10-CM

## 2013-07-15 DIAGNOSIS — Z8249 Family history of ischemic heart disease and other diseases of the circulatory system: Secondary | ICD-10-CM

## 2013-07-15 DIAGNOSIS — I442 Atrioventricular block, complete: Secondary | ICD-10-CM | POA: Diagnosis present

## 2013-07-15 DIAGNOSIS — IMO0001 Reserved for inherently not codable concepts without codable children: Secondary | ICD-10-CM | POA: Diagnosis present

## 2013-07-15 DIAGNOSIS — Z7982 Long term (current) use of aspirin: Secondary | ICD-10-CM

## 2013-07-15 DIAGNOSIS — I2119 ST elevation (STEMI) myocardial infarction involving other coronary artery of inferior wall: Principal | ICD-10-CM | POA: Diagnosis present

## 2013-07-15 DIAGNOSIS — E785 Hyperlipidemia, unspecified: Secondary | ICD-10-CM | POA: Diagnosis present

## 2013-07-15 DIAGNOSIS — Z87891 Personal history of nicotine dependence: Secondary | ICD-10-CM

## 2013-07-15 DIAGNOSIS — I2510 Atherosclerotic heart disease of native coronary artery without angina pectoris: Secondary | ICD-10-CM | POA: Diagnosis present

## 2013-07-15 DIAGNOSIS — Z9861 Coronary angioplasty status: Secondary | ICD-10-CM

## 2013-07-15 DIAGNOSIS — I4891 Unspecified atrial fibrillation: Secondary | ICD-10-CM | POA: Diagnosis present

## 2013-07-15 DIAGNOSIS — I252 Old myocardial infarction: Secondary | ICD-10-CM | POA: Diagnosis present

## 2013-07-15 DIAGNOSIS — R578 Other shock: Secondary | ICD-10-CM | POA: Diagnosis present

## 2013-07-15 HISTORY — PX: LEFT HEART CATH: SHX5478

## 2013-07-15 LAB — CBC WITH DIFFERENTIAL/PLATELET
Basophils Absolute: 0 10*3/uL (ref 0.0–0.1)
Basophils Relative: 0 % (ref 0–1)
EOS PCT: 1 % (ref 0–5)
Eosinophils Absolute: 0.1 10*3/uL (ref 0.0–0.7)
HEMATOCRIT: 39.1 % (ref 39.0–52.0)
HEMOGLOBIN: 13.1 g/dL (ref 13.0–17.0)
Lymphocytes Relative: 23 % (ref 12–46)
Lymphs Abs: 2.1 10*3/uL (ref 0.7–4.0)
MCH: 30.9 pg (ref 26.0–34.0)
MCHC: 33.5 g/dL (ref 30.0–36.0)
MCV: 92.2 fL (ref 78.0–100.0)
MONO ABS: 0.6 10*3/uL (ref 0.1–1.0)
MONOS PCT: 6 % (ref 3–12)
NEUTROS ABS: 6.3 10*3/uL (ref 1.7–7.7)
Neutrophils Relative %: 70 % (ref 43–77)
Platelets: 174 10*3/uL (ref 150–400)
RBC: 4.24 MIL/uL (ref 4.22–5.81)
RDW: 14.7 % (ref 11.5–15.5)
WBC: 9.1 10*3/uL (ref 4.0–10.5)

## 2013-07-15 LAB — COMPREHENSIVE METABOLIC PANEL
ALK PHOS: 80 U/L (ref 39–117)
ALT: 24 U/L (ref 0–53)
ANION GAP: 12 (ref 5–15)
AST: 68 U/L — ABNORMAL HIGH (ref 0–37)
Albumin: 3.1 g/dL — ABNORMAL LOW (ref 3.5–5.2)
BILIRUBIN TOTAL: 0.2 mg/dL — AB (ref 0.3–1.2)
BUN: 13 mg/dL (ref 6–23)
CHLORIDE: 108 meq/L (ref 96–112)
CO2: 19 mEq/L (ref 19–32)
CREATININE: 0.8 mg/dL (ref 0.50–1.35)
Calcium: 8.1 mg/dL — ABNORMAL LOW (ref 8.4–10.5)
GFR calc non Af Amer: 90 mL/min — ABNORMAL LOW (ref >90)
GLUCOSE: 101 mg/dL — AB (ref 70–99)
POTASSIUM: 4.3 meq/L (ref 3.7–5.3)
Sodium: 139 mEq/L (ref 137–147)
Total Protein: 5.7 g/dL — ABNORMAL LOW (ref 6.0–8.3)

## 2013-07-15 LAB — I-STAT CHEM 8, ED
BUN: 15 mg/dL (ref 6–23)
CALCIUM ION: 1.12 mmol/L — AB (ref 1.13–1.30)
Chloride: 106 mEq/L (ref 96–112)
Creatinine, Ser: 1.1 mg/dL (ref 0.50–1.35)
Glucose, Bld: 100 mg/dL — ABNORMAL HIGH (ref 70–99)
HEMATOCRIT: 41 % (ref 39.0–52.0)
HEMOGLOBIN: 13.9 g/dL (ref 13.0–17.0)
Potassium: 3.4 mEq/L — ABNORMAL LOW (ref 3.7–5.3)
Sodium: 144 mEq/L (ref 137–147)
TCO2: 18 mmol/L (ref 0–100)

## 2013-07-15 LAB — I-STAT TROPONIN, ED: TROPONIN I, POC: 0.03 ng/mL (ref 0.00–0.08)

## 2013-07-15 LAB — POCT ACTIVATED CLOTTING TIME: ACTIVATED CLOTTING TIME: 135 s

## 2013-07-15 LAB — PROTIME-INR
INR: 1.23 (ref 0.00–1.49)
Prothrombin Time: 15.5 seconds — ABNORMAL HIGH (ref 11.6–15.2)

## 2013-07-15 LAB — TROPONIN I: Troponin I: >20 ng/mL (ref <0.30)

## 2013-07-15 LAB — MRSA PCR SCREENING: MRSA BY PCR: NEGATIVE

## 2013-07-15 LAB — TSH: TSH: 0.979 u[IU]/mL (ref 0.350–4.500)

## 2013-07-15 LAB — APTT: APTT: 69 s — AB (ref 24–37)

## 2013-07-15 SURGERY — LEFT HEART CATH
Anesthesia: Moderate Sedation

## 2013-07-15 MED ORDER — NITROGLYCERIN IN D5W 200-5 MCG/ML-% IV SOLN: 3.0000 ug/min | INTRAVENOUS | Status: DC

## 2013-07-15 MED ORDER — ALPRAZOLAM 0.25 MG PO TABS: 0.2500 mg | ORAL_TABLET | Freq: Two times a day (BID) | ORAL | Status: DC | PRN

## 2013-07-15 MED ORDER — TIROFIBAN (AGGRASTAT) BOLUS VIA INFUSION: 25.0000 ug/kg | Freq: Once | INTRAVENOUS | Status: DC

## 2013-07-15 MED ORDER — ASPIRIN 300 MG RE SUPP
300.0000 mg | RECTAL | Status: AC
  Filled 2013-07-15: qty 1

## 2013-07-15 MED ORDER — ASPIRIN EC 81 MG PO TBEC
81.0000 mg | DELAYED_RELEASE_TABLET | Freq: Every day | ORAL | Status: DC
  Administered 2013-07-16 – 2013-07-17 (×2): 81 mg via ORAL
  Filled 2013-07-15 (×2): qty 1

## 2013-07-15 MED ORDER — ASPIRIN 81 MG PO CHEW
CHEWABLE_TABLET | ORAL | Status: AC
  Administered 2013-07-15: 324 mg via ORAL
  Filled 2013-07-15: qty 4

## 2013-07-15 MED ORDER — ONDANSETRON HCL 4 MG/2ML IJ SOLN: 4.0000 mg | Freq: Four times a day (QID) | INTRAMUSCULAR | Status: DC | PRN

## 2013-07-15 MED ORDER — ASPIRIN 81 MG PO CHEW: 81.0000 mg | CHEWABLE_TABLET | Freq: Every day | ORAL | Status: DC

## 2013-07-15 MED ORDER — BIVALIRUDIN 250 MG IV SOLR
INTRAVENOUS | Status: AC
  Filled 2013-07-15: qty 250

## 2013-07-15 MED ORDER — TIROFIBAN HCL IV 5 MG/100ML
0.1500 ug/kg/min | INTRAVENOUS | Status: DC
  Filled 2013-07-15 (×2): qty 100

## 2013-07-15 MED ORDER — HEPARIN SODIUM (PORCINE) 5000 UNIT/ML IJ SOLN
INTRAMUSCULAR | Status: AC
  Administered 2013-07-15: 4000 [IU] via INTRAVENOUS
  Filled 2013-07-15: qty 1

## 2013-07-15 MED ORDER — ASPIRIN 81 MG PO CHEW
324.0000 mg | CHEWABLE_TABLET | Freq: Once | ORAL | Status: AC
  Administered 2013-07-15: 324 mg via ORAL

## 2013-07-15 MED ORDER — ONDANSETRON HCL 4 MG/2ML IJ SOLN
4.0000 mg | Freq: Four times a day (QID) | INTRAMUSCULAR | Status: DC | PRN
Start: 2013-07-15 — End: 2013-07-17

## 2013-07-15 MED ORDER — ATROPINE SULFATE 0.1 MG/ML IJ SOLN
INTRAMUSCULAR | Status: AC
  Filled 2013-07-15: qty 10

## 2013-07-15 MED ORDER — NITROGLYCERIN 0.4 MG SL SUBL: 0.4000 mg | SUBLINGUAL_TABLET | SUBLINGUAL | Status: DC | PRN

## 2013-07-15 MED ORDER — ACETAMINOPHEN 325 MG PO TABS: 650.0000 mg | ORAL_TABLET | ORAL | Status: DC | PRN

## 2013-07-15 MED ORDER — ASPIRIN 81 MG PO CHEW: 324.0000 mg | CHEWABLE_TABLET | ORAL | Status: AC

## 2013-07-15 MED ORDER — ACETAMINOPHEN 325 MG PO TABS
650.0000 mg | ORAL_TABLET | ORAL | Status: DC | PRN
  Administered 2013-07-15 – 2013-07-16 (×2): 650 mg via ORAL
  Filled 2013-07-15 (×2): qty 2

## 2013-07-15 MED ORDER — POTASSIUM CHLORIDE CRYS ER 20 MEQ PO TBCR
40.0000 meq | EXTENDED_RELEASE_TABLET | Freq: Once | ORAL | Status: AC
  Administered 2013-07-15: 40 meq via ORAL
  Filled 2013-07-15: qty 2

## 2013-07-15 MED ORDER — SODIUM CHLORIDE 0.9 % IV SOLN
INTRAVENOUS | Status: AC
  Administered 2013-07-15 – 2013-07-16 (×2): via INTRAVENOUS

## 2013-07-15 MED ORDER — PRASUGREL HCL 10 MG PO TABS
10.0000 mg | ORAL_TABLET | Freq: Every day | ORAL | Status: DC
  Administered 2013-07-16 – 2013-07-17 (×2): 10 mg via ORAL
  Filled 2013-07-15 (×2): qty 1

## 2013-07-15 MED ORDER — TIROFIBAN HCL IV 5 MG/100ML
0.1500 ug/kg/min | INTRAVENOUS | Status: AC
  Administered 2013-07-15 – 2013-07-16 (×2): 0.15 ug/kg/min via INTRAVENOUS
  Filled 2013-07-15 (×3): qty 100

## 2013-07-15 MED ORDER — TIROFIBAN (AGGRASTAT) BOLUS VIA INFUSION
25.0000 ug/kg | Freq: Once | INTRAVENOUS | Status: AC
  Filled 2013-07-15: qty 39

## 2013-07-15 MED ORDER — HEPARIN SODIUM (PORCINE) 5000 UNIT/ML IJ SOLN
4000.0000 [IU] | Freq: Once | INTRAMUSCULAR | Status: AC
  Administered 2013-07-15: 4000 [IU] via INTRAVENOUS

## 2013-07-15 MED ORDER — PRASUGREL HCL 10 MG PO TABS
ORAL_TABLET | ORAL | Status: AC
  Filled 2013-07-15: qty 6

## 2013-07-15 MED ORDER — PANTOPRAZOLE SODIUM 40 MG PO TBEC
40.0000 mg | DELAYED_RELEASE_TABLET | Freq: Every day | ORAL | Status: DC
  Administered 2013-07-16: 40 mg via ORAL
  Filled 2013-07-15: qty 1

## 2013-07-15 MED ORDER — TIROFIBAN HCL IV 5 MG/100ML
INTRAVENOUS | Status: AC
  Filled 2013-07-15: qty 100

## 2013-07-15 MED ORDER — TIROFIBAN HCL IV 5 MG/100ML: 0.1500 ug/kg/min | INTRAVENOUS | Status: DC

## 2013-07-15 MED ORDER — EZETIMIBE 10 MG PO TABS
10.0000 mg | ORAL_TABLET | Freq: Every day | ORAL | Status: DC
  Administered 2013-07-16 – 2013-07-17 (×2): 10 mg via ORAL
  Filled 2013-07-15 (×2): qty 1

## 2013-07-15 NOTE — H&P (Signed)
Dustin Bauer is an 68 y.o. male.   Chief Complaint: Dizziness lightheadedness/syncope HPI: Patient is 68 year old male with past medical history significant for coronary artery disease had PTCA stenting to proximal LAD approximately 4 years ago at [facility], history of atrial fibrillation status post the atrial fibrillation in the past, hypercholesteremia, tobacco abuse, positive family history of coronary artery disease, was brought to the ER by EMS as patient had syncopal episode while exercising in the gym. States he felt dizzy lightheaded and suddenly passed out. Denies any chest pain pressure tightness prior to passing out. Patient denies such episodes the past EKG done on the field showed sinus rhythm with complete heart block and junctional escape rhythm and ST elevation in leads 23 aVF and ST depression in V1 to V6 also respiratory changes indeed 1 and aVL her. Patient was noted to be hypotensive with blood pressure of 70s. Patient received a fluid challenge aspirin and heparin in the ED with a increase in his heart rate in 50s and blood pressure in low 100s. Patient denies any history of anginal chest pain nausea vomiting diaphoresis and the past and is PND orthopnea leg swelling. Denies palpitation lightheadedness or syncope in the past  Past Medical History  Diagnosis Date  . Hyperlipidemia   . Hyperplasia of prostate   . Nicotine abuse   . ASCVD (arteriosclerotic cardiovascular disease)   . Presence of drug coated stent in LAD coronary artery   . Atrial fibrillation   . Hydrocele, left   . Colon polyps   . A-fib     Past Surgical History  Procedure Laterality Date  . Tonsillectomy and adenoidectomy      Family History  Problem Relation Age of Onset  . Dementia Mother   . Heart disease Father   . Hyperlipidemia Father    Social History:  reports that he quit smoking about 13 years ago. His smoking use included Cigarettes. He smoked 0.00 packs per day. He does not have  any smokeless tobacco history on file. He reports that he drinks alcohol. He reports that he does not use illicit drugs.  Allergies:  Allergies  Allergen Reactions  . Crestor [Rosuvastatin Calcium]   . Lipitor [Atorvastatin Calcium]   . Statins     Medications Prior to Admission  Medication Sig Dispense Refill  . aspirin (SB LOW DOSE ASA EC) 81 MG EC tablet Take 162 mg by mouth daily.       Marland Kitchen ezetimibe (ZETIA) 10 MG tablet TAKE 1 TABLETS ONCE A DAY  30 tablet  5  . hydrochlorothiazide (MICROZIDE) 12.5 MG capsule Take 1 capsule (12.5 mg total) by mouth daily.  30 capsule  5  . ketoconazole (NIZORAL) 2 % cream       . NITROSTAT 0.4 MG SL tablet         Results for orders placed during the hospital encounter of 07/15/13 (from the past 48 hour(s))  I-STAT TROPOININ, ED     Status: None   Collection Time    07/15/13  2:38 PM      Result Value Ref Range   Troponin i, poc 0.03  0.00 - 0.08 ng/mL   Comment 3            Comment: Due to the release kinetics of cTnI,     a negative result within the first hours     of the onset of symptoms does not rule out     myocardial infarction with certainty.  If myocardial infarction is still suspected,     repeat the test at appropriate intervals.  I-STAT CHEM 8, ED     Status: Abnormal   Collection Time    07/15/13  2:40 PM      Result Value Ref Range   Sodium 144  137 - 147 mEq/L   Potassium 3.4 (*) 3.7 - 5.3 mEq/L   Chloride 106  96 - 112 mEq/L   BUN 15  6 - 23 mg/dL   Creatinine, Ser 1.10  0.50 - 1.35 mg/dL   Glucose, Bld 100 (*) 70 - 99 mg/dL   Calcium, Ion 1.12 (*) 1.13 - 1.30 mmol/L   TCO2 18  0 - 100 mmol/L   Hemoglobin 13.9  13.0 - 17.0 g/dL   HCT 41.0  39.0 - 52.0 %   No results found.  Review of Systems  Constitutional: Negative for fever, chills, weight loss and malaise/fatigue.  Eyes: Positive for double vision. Negative for photophobia.  Respiratory: Negative for cough, hemoptysis and sputum production.    Cardiovascular: Negative for chest pain, palpitations, orthopnea, claudication and leg swelling.  Gastrointestinal: Negative for nausea, vomiting and abdominal pain.  Genitourinary: Negative for dysuria and urgency.  Neurological: Positive for dizziness and loss of consciousness. Negative for headaches.    There were no vitals taken for this visit. Physical Exam  Constitutional: He is oriented to person, place, and time.  HENT:  Head: Normocephalic and atraumatic.  Eyes: Conjunctivae are normal. Pupils are equal, round, and reactive to light. Left eye exhibits no discharge. No scleral icterus.  Neck: Normal range of motion. Neck supple. No JVD present. No tracheal deviation present. No thyromegaly present.  Cardiovascular: Normal rate and regular rhythm.   Murmur (Soft systolic murmur and S4 gallop noted) heard. Respiratory: Effort normal and breath sounds normal. No respiratory distress. He has no wheezes. He has no rales.  GI: Soft. Bowel sounds are normal. He exhibits no distension. There is no tenderness. There is no rebound and no guarding.  Musculoskeletal: He exhibits no edema.  Neurological: He is alert and oriented to person, place, and time.     Assessment/Plan Acute inferoposterior wall MI Status post complete heart block Status post hypotensive shock Coronary artery disease history of PCI to LAD the past Hypercholesteremia Tobacco abuse Positive family history of coronary artery disease History of atrial fibrillation status post A. fib ablation in the past Plan Discussed with patient her regarding emergency left cath possible PTCA stenting its risk and benefits i.e. death MI stroke need for emergency CABG local last complications and consents for PCI.  Trista Ciocca N 07/15/2013, 5:00 PM

## 2013-07-15 NOTE — ED Notes (Signed)
Patient transported by [facility] for syncopal episode at the gym.  Patient was on treadmill and passed out.  Patient had initial blood pressure of 70/pal.  Heart rate in the 40's.

## 2013-07-15 NOTE — ED Notes (Signed)
Pt transported to Cath lab

## 2013-07-15 NOTE — CV Procedure (Signed)
Left cardiac cath/PTCA stenting report dictated on 07/15/2013 dictation number is [id number]

## 2013-07-16 LAB — LIPID PANEL
CHOL/HDL RATIO: 3.3 ratio
Cholesterol: 131 mg/dL (ref 0–200)
HDL: 40 mg/dL (ref >39)
LDL CALC: 76 mg/dL (ref 0–99)
Triglycerides: 74 mg/dL (ref <150)
VLDL: 15 mg/dL (ref 0–40)

## 2013-07-16 LAB — BASIC METABOLIC PANEL
ANION GAP: 15 (ref 5–15)
BUN: 16 mg/dL (ref 6–23)
CALCIUM: 8.3 mg/dL — AB (ref 8.4–10.5)
CO2: 17 mEq/L — ABNORMAL LOW (ref 19–32)
CREATININE: 0.97 mg/dL (ref 0.50–1.35)
Chloride: 110 mEq/L (ref 96–112)
GFR calc non Af Amer: 83 mL/min — ABNORMAL LOW (ref >90)
Glucose, Bld: 99 mg/dL (ref 70–99)
Potassium: 4.4 mEq/L (ref 3.7–5.3)
Sodium: 142 mEq/L (ref 137–147)

## 2013-07-16 LAB — CBC
HCT: 35.4 % — ABNORMAL LOW (ref 39.0–52.0)
HEMOGLOBIN: 12.2 g/dL — AB (ref 13.0–17.0)
MCH: 32 pg (ref 26.0–34.0)
MCHC: 34.5 g/dL (ref 30.0–36.0)
MCV: 92.9 fL (ref 78.0–100.0)
Platelets: 165 10*3/uL (ref 150–400)
RBC: 3.81 MIL/uL — ABNORMAL LOW (ref 4.22–5.81)
RDW: 14.9 % (ref 11.5–15.5)
WBC: 7.3 10*3/uL (ref 4.0–10.5)

## 2013-07-16 LAB — TROPONIN I

## 2013-07-16 NOTE — Progress Notes (Signed)
R femoral arterial and venous sheaths pulled. Pressure held x25 minutes. Pt tolerated well. R groin Level 0. Provided instructions on care and to notify staff if burning or cool sensation felt.

## 2013-07-16 NOTE — Progress Notes (Signed)
Ref: Redge Gainer, MD   Subjective:  Feeling better. No chest pain. Afebrile.  Objective:  Vital Signs in the last 24 hours: Temp:  [97.5 F (36.4 C)-98.7 F (37.1 C)] 97.8 F (36.6 C) (07/04 0732) Pulse Rate:  [28-62] 41 (07/03 2100) Cardiac Rhythm:  [-] Heart block (07/04 0800) Resp:  [11-24] 15 (07/04 0732) BP: (112-145)/(67-105) 136/81 mmHg (07/04 0732) SpO2:  [98 %-100 %] 100 % (07/04 0732) Weight:  [77 kg (169 lb 12.1 oz)] 77 kg (169 lb 12.1 oz) (07/03 1700)  Physical Exam: BP Readings from Last 1 Encounters:  07/16/13 136/81    Wt Readings from Last 1 Encounters:  07/15/13 77 kg (169 lb 12.1 oz)    Weight change:   HEENT: [place]/AT, Eyes- PERL, EOMI, Conjunctiva-Pink, Sclera-Non-icteric Neck: No JVD, No bruit, Trachea midline. Lungs:  Clear, Bilateral. Cardiac:  Regular rhythm, normal S1 and S2, no S3.  Abdomen:  Soft, non-tender. Extremities:  No edema present. No cyanosis. No clubbing. No groin hematoma CNS: AxOx3, Cranial nerves grossly intact, moves all 4 extremities. Right handed. Skin: Warm and dry.   Intake/Output from previous day: 07/03 0701 - 07/04 0700 In: 1515.4 [I.V.:1515.4] Out: 1450 [Urine:1450]    Lab Results: BMET    Component Value Date/Time   NA 142 07/16/2013 0243   NA 139 07/15/2013 1952   NA 144 07/15/2013 1440   NA 142 05/30/2013 1441   NA 142 11/29/2012 1633   K 4.4 07/16/2013 0243   K 4.3 07/15/2013 1952   K 3.4* 07/15/2013 1440   CL 110 07/16/2013 0243   CL 108 07/15/2013 1952   CL 106 07/15/2013 1440   CO2 17* 07/16/2013 0243   CO2 19 07/15/2013 1952   CO2 24 05/30/2013 1441   GLUCOSE 99 07/16/2013 0243   GLUCOSE 101* 07/15/2013 1952   GLUCOSE 100* 07/15/2013 1440   GLUCOSE 90 05/30/2013 1441   GLUCOSE 90 11/29/2012 1633   BUN 16 07/16/2013 0243   BUN 13 07/15/2013 1952   BUN 15 07/15/2013 1440   BUN 18 05/30/2013 1441   BUN 19 11/29/2012 1633   CREATININE 0.97 07/16/2013 0243   CREATININE 0.80 07/15/2013 1952   CREATININE 1.10 07/15/2013 1440   CREATININE  1.05 06/24/2012 1627   CALCIUM 8.3* 07/16/2013 0243   CALCIUM 8.1* 07/15/2013 1952   CALCIUM 9.5 05/30/2013 1441   GFRNONAA 83* 07/16/2013 0243   GFRNONAA 90* 07/15/2013 1952   GFRNONAA 75 05/30/2013 1441   GFRNONAA 73 06/24/2012 1627   GFRAA >90 07/16/2013 0243   GFRAA >90 07/15/2013 1952   GFRAA 87 05/30/2013 1441   GFRAA 84 06/24/2012 1627   CBC    Component Value Date/Time   WBC 7.3 07/16/2013 0243   WBC 8.8 05/30/2013 1715   WBC 9.2 05/30/2013 1604   RBC 3.81* 07/16/2013 0243   RBC 4.44 05/30/2013 1715   RBC 4.7 05/30/2013 1604   HGB 12.2* 07/16/2013 0243   HGB 13.7* 05/30/2013 1604   HCT 35.4* 07/16/2013 0243   HCT 44.2 05/30/2013 1604   PLT 165 07/16/2013 0243   MCV 92.9 07/16/2013 0243   MCV 94.4 05/30/2013 1604   MCH 32.0 07/16/2013 0243   MCH 31.3 05/30/2013 1715   MCH 29.3 05/30/2013 1604   MCHC 34.5 07/16/2013 0243   MCHC 33.5 05/30/2013 1715   MCHC 31.0* 05/30/2013 1604   RDW 14.9 07/16/2013 0243   RDW 14.8 05/30/2013 1715   LYMPHSABS 2.1 07/15/2013 1952   LYMPHSABS 2.4 05/30/2013 1715  MONOABS 0.6 07/15/2013 1952   EOSABS 0.1 07/15/2013 1952   EOSABS 0.4 05/30/2013 1715   BASOSABS 0.0 07/15/2013 1952   BASOSABS 0.0 05/30/2013 1715   HEPATIC Function Panel  Recent Labs  11/29/12 1633 05/30/13 1441 07/15/13 1952  PROT 6.5 6.0 5.7*   HEMOGLOBIN A1C No components found with this basename: HGA1C,  MPG   CARDIAC ENZYMES Lab Results  Component Value Date   TROPONINI >20.00* 07/16/2013   TROPONINI >20.00* 07/15/2013   BNP No results found for this basename: PROBNP,  in the last 8760 hours TSH  Recent Labs  07/15/13 1952  TSH 0.979   CHOLESTEROL  Recent Labs  07/16/13 0243  CHOL 131    Scheduled Meds: . aspirin  324 mg Oral NOW   Or  . aspirin  300 mg Rectal NOW  . aspirin EC  81 mg Oral Daily  . ezetimibe  10 mg Oral Daily  . pantoprazole  40 mg Oral Q0600  . prasugrel  10 mg Oral Daily   Continuous Infusions: . nitroGLYCERIN     PRN Meds:.acetaminophen, ALPRAZolam, nitroGLYCERIN,  ondansetron (ZOFRAN) IV  Assessment/Plan:  Acute inferoposterior wall MI  Status post complete heart block  Status post hypotensive shock  Coronary artery disease history of PCI to LAD the past  Hypercholesteremia  Tobacco abuse  Positive family history of coronary artery disease  History of atrial fibrillation status post A. fib ablation in the past  Continue medical treatment. Increase activity.     LOS: 1 day    Dixie Dials  MD  07/16/2013, 10:39 AM

## 2013-07-16 NOTE — Progress Notes (Signed)
Pt received into room 2w31, pt oriented to room and call bell, wife at bedside, pt ambulatory on room air, tele placed on pt Rickard Rhymes, RN

## 2013-07-16 NOTE — Cardiovascular Report (Signed)
NAMETRIGG, DELAROCHA NO.:  [id number]  MEDICAL RECORD NO.:  [id number]  LOCATION:  2H03C                        FACILITY:  [facility]  PHYSICIAN:  Waylin Dorko N. Terrence Dupont, M.D. DATE OF BIRTH:  1945-05-29  DATE OF PROCEDURE:  07/15/2013 DATE OF DISCHARGE:                           CARDIAC CATHETERIZATION   PROCEDURES PERFORMED: 1. Left cardiac cath with selective left and right coronary     angiography, LV graphy via right groin using Judkins technique. 2. Insertion of temporary transvenous pacemaker via right femoral     venous approach. 3. Successful PTCA to 100% occluded proximal RCA using 2.5 x 12 mm     long Euphora balloon for predilatation. 4. Successful deployment of 3.0 x 33 mm long Xience Alpine drug-     regulating stent in proximal and mid RCA. 5. Successful postdilatation of this stent using 3.25 x 15 mm long [place]     Trek balloon going up to 18 atmospheric pressure. 6. Successful further postdilatation of this stent using 3.5 x 20 mm     long [place] Trek balloon going up to 18 atmospheric pressure.  INDICATION FOR THE PROCEDURE:  Dustin Bauer is a 67 year old male with past medical history significant for coronary artery disease, history of PTCA stenting to proximal LAD approximately 4 years ago at [facility] history of atrial fibrillation status post AFib ablation in the past, hypercholesteremia, tobacco abuse, positive family history of coronary artery disease was brought to the ER by EMS as the patient had syncopal episode while exercising in the gym.  He states he felt dizzy, lightheaded, and suddenly passed out.  Denies any chest pain, pressure, or tightness prior to passing out.  The patient denies such episodes in the past.  EKG done in the field showed sinus rhythm with complete heart block and junctional escape rhythm, and ST elevation in lead to II, III, AVF, and ST depression in V1 to V6, also reciprocal changes in lead 1 and aVL.  The patient was  noted to be hypotensive with blood pressure of 70s, received fluid challenge aspirin and heparin in the ED with increase in his heart rate in 50s and blood pressure in low 100s.  The patient denies any history of anginal chest pain, nausea, vomiting, or diaphoresis in the past.  Also denies PND, orthopnea, or leg swelling. Denies palpitation, lightheadedness, or syncope in the past.  PHYSICAL EXAMINATION:  GENERAL:  When seen in the ER was alert, awake, oriented, hemodynamically stable. HEENT:  Conjunctivae was pink. NECK:  Supple.  No JVD.  No bruit. LUNGS:  Clear to auscultation without rhonchi or rales. CARDIOVASCULAR:  S1, S2 was normal.  There was soft systolic murmur and S4 gallop. ABDOMEN:  Soft.  Bowel sounds were present.  Nontender. EXTREMITIES:  There is no clubbing, cyanosis, or edema.  I discussed with the patient about his EKG finding and acute MI suggestive of inferoposterior wall acute MI and emergency left cath, possible PTCA stenting, its risks and benefits, i.e., death, MI, stroke, need for emergency CABG, local vascular complications, etc. and consented for PCI.  DESCRIPTION OF PROCEDURE:  After obtaining the informed consent, the patient was brought to the  cath lab and was placed on fluoroscopy table. Right groin was prepped and draped in usual fashion.  1% Xylocaine was used for local anesthesia in the right groin.  With the help of thin wall needle, 6-French arterial and venous sheaths were placed.  Both the sheaths were aspirated and flushed.  Next, 5-French balloon tipped temporary transvenous pacemaker was advanced via the right femoral venous approach to RV apex without difficulty.  Next, 6-French left Judkins catheter was advanced over the wire under fluoroscopic guidance up to the ascending aorta.  Wire was pulled out.  The catheter was aspirated and connected to the Manifold.  Catheter was further advanced and engaged into left coronary ostium.   Multiple views of the left system were taken.  Next, catheter was disengaged and was pulled out over the wire and was replaced with 6-French Judkins guiding catheter which was advanced over the wire under fluoroscopic guidance up to the ascending aorta.  Wire was pulled out.  The catheter was aspirated and connected to the Manifold.  Catheter was further advanced and engaged into right coronary ostium.  A single view of right coronary artery was obtained.  Next, this catheter was disengaged at the end of the procedure and was replaced with 6-French pigtail catheter which was advanced over the wire under fluoroscopic guidance up to the ascending aorta.  Wire was pulled out.  The catheter was aspirated and connected to the Manifold.  Catheter was further advanced across the aortic valve into the LV.  LV pressures were recorded.  Next, LV graft was done in 30- degree RAO position.  Post-angiographic pressures were recorded from LV and then pullback pressures were recorded from aorta.  There was no gradient across the aortic valve.  Next, pigtail catheter was pulled out over the wire.  Sheaths were aspirated and flushed.  FINDINGS:  LV showed inferior mid and basal wall hypokinesia, EF of 50- 55%, left main was patent.  LAD has 30-40% proximal stenosis before the stent, stented segment was widely patent.  Diagonal 1-3 were very small. Left circumflex was patent in proximal portion and then tapers down in AV groove.  OM1 has 60-70% smooth proximal stenosis.  RCA was 100% occluded beyond the proximal portion with TIMI 0 flow.  INTERVENTIONAL PROCEDURE:  Successful PTCA to proximal RCA was done using 2.5 x 12 mm long Euphora balloon for predilatation and then 3.0 x 33 mm long Xience Alpine drug-eluting stent was deployed at 11 atmospheric pressure.  This stent was post dilated using 3.25 mm x 15 mm long [place] Trek balloon going up to 18 atmospheric pressure.  Angiogram showed persistent haziness  in the proximal half of the stent with possible thrombus/prolapse of the endothelium.  The proximal half of the stent was post dilated using 3.5 x 20 mm long [place] Trek balloon going up to 18 atmospheric pressure.  The patient was also started Aggrastat during the procedures with improvement in angiographic flow with distal TIMI grade 3 distal flow.  The patient received weight based Angiomax, 60 mg of prasugrel, and Aggrastat during the procedure.  The patient tolerated the procedure well.  Temporary transvenous pacemaker was discontinued at the end of the procedure.  The patient was transferred to recovery room in stable condition.     Allegra Lai. Terrence Dupont, M.D.     MNH/MEDQ  D:  07/15/2013  T:  07/16/2013  Job:  [id number]

## 2013-07-17 LAB — CBC WITH DIFFERENTIAL/PLATELET
BASOS ABS: 0 10*3/uL (ref 0.0–0.1)
BASOS PCT: 0 % (ref 0–1)
Eosinophils Absolute: 0.4 10*3/uL (ref 0.0–0.7)
Eosinophils Relative: 5 % (ref 0–5)
HEMATOCRIT: 37.5 % — AB (ref 39.0–52.0)
Hemoglobin: 12.7 g/dL — ABNORMAL LOW (ref 13.0–17.0)
LYMPHS PCT: 27 % (ref 12–46)
Lymphs Abs: 1.8 10*3/uL (ref 0.7–4.0)
MCH: 31.4 pg (ref 26.0–34.0)
MCHC: 33.9 g/dL (ref 30.0–36.0)
MCV: 92.6 fL (ref 78.0–100.0)
Monocytes Absolute: 0.6 10*3/uL (ref 0.1–1.0)
Monocytes Relative: 10 % (ref 3–12)
NEUTROS PCT: 58 % (ref 43–77)
Neutro Abs: 3.8 10*3/uL (ref 1.7–7.7)
Platelets: 156 10*3/uL (ref 150–400)
RBC: 4.05 MIL/uL — ABNORMAL LOW (ref 4.22–5.81)
RDW: 14.7 % (ref 11.5–15.5)
WBC: 6.7 10*3/uL (ref 4.0–10.5)

## 2013-07-17 LAB — TROPONIN I: Troponin I: 6.06 ng/mL (ref <0.30)

## 2013-07-17 LAB — BASIC METABOLIC PANEL
Anion gap: 12 (ref 5–15)
BUN: 13 mg/dL (ref 6–23)
CALCIUM: 9.5 mg/dL (ref 8.4–10.5)
CHLORIDE: 108 meq/L (ref 96–112)
CO2: 24 meq/L (ref 19–32)
CREATININE: 0.95 mg/dL (ref 0.50–1.35)
GFR calc non Af Amer: 84 mL/min — ABNORMAL LOW (ref >90)
Glucose, Bld: 90 mg/dL (ref 70–99)
Potassium: 4.1 mEq/L (ref 3.7–5.3)
SODIUM: 144 meq/L (ref 137–147)

## 2013-07-17 MED ORDER — PRASUGREL HCL 10 MG PO TABS: 10.0000 mg | ORAL_TABLET | Freq: Every day | ORAL | Status: DC

## 2013-07-17 MED ORDER — PANTOPRAZOLE SODIUM 40 MG PO TBEC: 40.0000 mg | DELAYED_RELEASE_TABLET | Freq: Every day | ORAL | Status: DC

## 2013-07-17 NOTE — ED Provider Notes (Signed)
CSN: [id number]     Arrival date & time 07/15/13  1424 History   First MD Initiated Contact with Patient 07/15/13 1440     Chief Complaint  Patient presents with  . Code STEMI      HPI Patient transported by [facility] for syncopal episode at the gym. Patient was on treadmill and passed out. Patient had initial blood pressure of 70/pal. Heart rate in the 40's.  Past Medical History  Diagnosis Date  . Hyperlipidemia   . Hyperplasia of prostate   . Nicotine abuse   . ASCVD (arteriosclerotic cardiovascular disease)   . Presence of drug coated stent in LAD coronary artery   . Atrial fibrillation   . Hydrocele, left   . Colon polyps   . A-fib   . Coronary artery disease   . Hypertension   . Hypothyroidism    Past Surgical History  Procedure Laterality Date  . Tonsillectomy and adenoidectomy    . Cardiac catheterization     Family History  Problem Relation Age of Onset  . Dementia Mother   . Heart disease Father   . Hyperlipidemia Father    History  Substance Use Topics  . Smoking status: Current Every Day Smoker -- 0.25 packs/day    Types: Cigarettes  . Smokeless tobacco: Not on file  . Alcohol Use: Yes    Review of Systems  Unable to perform ROS: Acuity of condition      Allergies  Crestor; Lipitor; and Statins  Home Medications   Prior to Admission medications   Medication Sig Start Date End Date Taking? Authorizing Provider  acetaminophen (TYLENOL) 325 MG tablet Take 325 mg by mouth at bedtime as needed (pain/sleep).   Yes Historical Provider, MD  aspirin EC 81 MG tablet Take 162 mg by mouth daily.   Yes Historical Provider, MD  cholecalciferol (VITAMIN D) 1000 UNITS tablet Take 1,000 Units by mouth daily.   Yes Historical Provider, MD  ezetimibe (ZETIA) 10 MG tablet Take 10 mg by mouth daily.   Yes Historical Provider, MD  hydrochlorothiazide (MICROZIDE) 12.5 MG capsule Take 12.5 mg by mouth daily.   Yes Historical Provider, MD  MELATONIN PO Take  2 mg by mouth at bedtime.   Yes Historical Provider, MD  nitroGLYCERIN (NITROSTAT) 0.4 MG SL tablet Place 0.4 mg under the tongue every 5 (five) minutes as needed for chest pain.   Yes Historical Provider, MD  Resveratrol 250 MG CAPS Take 250 mg by mouth daily.   Yes Historical Provider, MD   BP 142/78  Pulse 49  Temp(Src) 97.6 F (36.4 C) (Oral)  Resp 18  Wt 169 lb 12.1 oz (77 kg)  SpO2 98% Physical Exam  Nursing note and vitals reviewed. Constitutional: He is oriented to person, place, and time. He appears well-developed and well-nourished. No distress.  HENT:  Head: Normocephalic and atraumatic.  Eyes: Pupils are equal, round, and reactive to light.  Neck: Normal range of motion.  Cardiovascular: Intact distal pulses.  Bradycardia present.   Pulmonary/Chest: No respiratory distress.  Abdominal: Normal appearance. He exhibits no distension. There is no tenderness. There is no rebound.  Musculoskeletal: Normal range of motion.  Neurological: He is alert and oriented to person, place, and time. No cranial nerve deficit.  Skin: Skin is warm. No rash noted. He is diaphoretic. There is pallor.  Psychiatric: He has a normal mood and affect. His behavior is normal.    ED Course  Procedures (including critical care time) Code  STEMI called CRITICAL CARE Performed by: Leonard Schwartz L Total critical care time: 30 Critical care time was exclusive of separately billable procedures and treating other patients. Critical care was necessary to treat or prevent imminent or life-threatening deterioration. Critical care was time spent personally by me on the following activities: development of treatment plan with patient and/or surrogate as well as nursing, discussions with consultants, evaluation of patient's response to treatment, examination of patient, obtaining history from patient or surrogate, ordering and performing treatments and interventions, ordering and review of laboratory studies,  ordering and review of radiographic studies, pulse oximetry and re-evaluation of patient's condition.  Labs Review Labs Reviewed  BASIC METABOLIC PANEL - Abnormal; Notable for the following:    CO2 17 (*)    Calcium 8.3 (*)    GFR calc non Af Amer 83 (*)    All other components within normal limits  CBC - Abnormal; Notable for the following:    RBC 3.81 (*)    Hemoglobin 12.2 (*)    HCT 35.4 (*)    All other components within normal limits  TROPONIN I - Abnormal; Notable for the following:    Troponin I >20.00 (*)    All other components within normal limits  TROPONIN I - Abnormal; Notable for the following:    Troponin I >20.00 (*)    All other components within normal limits  PROTIME-INR - Abnormal; Notable for the following:    Prothrombin Time 15.5 (*)    All other components within normal limits  APTT - Abnormal; Notable for the following:    aPTT 69 (*)    All other components within normal limits  COMPREHENSIVE METABOLIC PANEL - Abnormal; Notable for the following:    Glucose, Bld 101 (*)    Calcium 8.1 (*)    Total Protein 5.7 (*)    Albumin 3.1 (*)    AST 68 (*)    Total Bilirubin 0.2 (*)    GFR calc non Af Amer 90 (*)    All other components within normal limits  I-STAT CHEM 8, ED - Abnormal; Notable for the following:    Potassium 3.4 (*)    Glucose, Bld 100 (*)    Calcium, Ion 1.12 (*)    All other components within normal limits  MRSA PCR SCREENING  MRSA PCR SCREENING  CBC WITH DIFFERENTIAL  TSH  LIPID PANEL  I-STAT TROPOININ, ED  POCT ACTIVATED CLOTTING TIME    Imaging Review No results found.   EKG Interpretation   Date/Time:  Friday July 15 2013 14:28:02 EDT Ventricular Rate:  46 PR Interval:    QRS Duration: 88 QT Interval:  456 QTC Calculation: 399 R Axis:   89 Text Interpretation:  AV block, complete (third degree) ** ** ACUTE MI /  STEMI ** ** Abnormal ECG Confirmed by Raynetta Osterloh  MD, Daneille Desilva ([id number]) on  07/17/2013 7:30:46 AM      MDM    Final diagnoses:  Acute MI, inferoposterior wall        Dot Lanes, MD 07/17/13 [phone]

## 2013-07-17 NOTE — Discharge Summary (Signed)
Physician Discharge Summary  Patient ID: Dustin Bauer MRN: [id number] DOB/AGE: 07/31/1945 68 y.o.  Admit date: 07/15/2013 Discharge date: 07/17/2013  Admission Diagnoses: Acute inferoposterior wall MI  Status post complete heart block  Status post hypotensive shock  Coronary artery disease history of PCI to LAD the past  Hypercholesteremia  Tobacco abuse  Positive family history of coronary artery disease  History of atrial fibrillation status post A. fib ablation in the past  Discharge Diagnoses:  Principle Problem: * Acute MI, inferoposterior wall * Status post complete heart block  Status post hypotensive shock  Coronary artery disease history of PCI to LAD the past  Hypercholesteremia  Tobacco abuse  Positive family history of coronary artery disease  History of atrial fibrillation status post A. fib ablation in the past   Discharged Condition: good  Hospital Course: Patient is 68 year old male with past medical history significant for coronary artery disease had PTCA stenting to proximal LAD approximately 4 years ago at [facility], history of atrial fibrillation status post the atrial fibrillation in the past, hypercholesteremia, tobacco abuse, positive family history of coronary artery disease, was brought to the ER by EMS as patient had syncopal episode while exercising in the gym. EKG done on the field showed sinus rhythm with complete heart block and junctional escape rhythm and ST elevation in leads 23 aVF and ST depression in V1 to V6 also respiratory changes indeed 1 and aVL her. Patient was noted to be hypotensive with blood pressure of 70s. Emergent left heart cath and stent placement in Proximal RCA was done by Dr. Charolette Forward with good result and TIMI grade -3 flow. He was monitored for 48 hours. Patient has significant myalgia with Lipitor, Crestor or Zocor use. Hence he will continue Zetia and was advised to consider CO-Q-10 100 mg. daily use to decrease muscle  pain with low dose Crestor. He was discharged home in stable condition with follow up by primary care and Dr. Terrence Dupont in 1-2 weeks.   Consults: cardiology  Significant Diagnostic Studies: labs: Normal Hgb and BMET. Elevated torponin-I trending downward to 6.06 today from over 20 yesterday. Angiography: Mild LAD disease and total occlusion of proximal RCA treated with stent placement with good result.  Treatments: cardiac meds: Aspirin, Prasugrel and ezetimibe.   Discharge Exam: Blood pressure 142/78, pulse 49, temperature 97.6 F (36.4 C), temperature source Oral, resp. rate 18, weight 77 kg (169 lb 12.1 oz), SpO2 98.00%. HEENT: [place]/AT, Eyes- PERL, EOMI, Conjunctiva-Pink, Sclera-Non-icteric  Neck: No JVD, No bruit, Trachea midline.  Lungs: Clear, Bilateral.  Cardiac: Regular rhythm, normal S1 and S2, no S3.  Abdomen: Soft, non-tender.  Extremities: No edema present. No cyanosis. No clubbing. No groin hematoma  CNS: AxOx3, Cranial nerves grossly intact, moves all 4 extremities. Right handed.  Skin: Warm and dry.  Disposition: 01, Home or self care.     Medication List         acetaminophen 325 MG tablet  Commonly known as:  TYLENOL  Take 325 mg by mouth at bedtime as needed (pain/sleep).     aspirin EC 81 MG tablet  Take 162 mg by mouth daily.     cholecalciferol 1000 UNITS tablet  Commonly known as:  VITAMIN D  Take 1,000 Units by mouth daily.     ezetimibe 10 MG tablet  Commonly known as:  ZETIA  Take 10 mg by mouth daily.     hydrochlorothiazide 12.5 MG capsule  Commonly known as:  MICROZIDE  Take 12.5  mg by mouth daily.     MELATONIN PO  Take 2 mg by mouth at bedtime.     nitroGLYCERIN 0.4 MG SL tablet  Commonly known as:  NITROSTAT  Place 0.4 mg under the tongue every 5 (five) minutes as needed for chest pain.     pantoprazole 40 MG tablet  Commonly known as:  PROTONIX  Take 1 tablet (40 mg total) by mouth daily at 6 (six) AM.     prasugrel 10 MG Tabs  tablet  Commonly known as:  EFFIENT  Take 1 tablet (10 mg total) by mouth daily.     Resveratrol 250 MG Caps  Take 250 mg by mouth daily.           Follow-up Information   Follow up with Redge Gainer, MD. Schedule an appointment as soon as possible for a visit in 1 week.   Specialty:  Family Medicine   Contact information:   [place] [address] [phone]       Follow up with Clent Demark, MD. Schedule an appointment as soon as possible for a visit in 2 weeks.   Specialty:  Cardiology   Contact information:   [place] [address] [place] [address] [phone]       Signed: Birdie Riddle 07/17/2013, 10:20 AM

## 2013-07-17 NOTE — Progress Notes (Signed)
Discharge education,medication, follow-up appts, and post-cath instructions given to pt, pt stated understanding and had no questions, importance of medication adherence given to pt, IV and tele removed, pt getting dressed awaiting wife for pickup Rickard Rhymes, RN

## 2013-07-18 ENCOUNTER — Ambulatory Visit
Admission: RE | Admit: 2013-07-18 | Discharge: 2013-07-18 | Disposition: A | Discharge status: Home / Self Care | Payer: Medicare FFS | Source: Ambulatory Visit | Attending: Family Medicine | Admitting: Family Medicine

## 2013-07-18 DIAGNOSIS — R911 Solitary pulmonary nodule: Secondary | ICD-10-CM

## 2013-07-18 LAB — POCT ACTIVATED CLOTTING TIME
ACTIVATED CLOTTING TIME: 399 s
ACTIVATED CLOTTING TIME: 422 s

## 2013-07-18 MED FILL — Sodium Chloride IV Soln 0.9%: INTRAVENOUS | Qty: 50 | Status: AC

## 2013-07-20 ENCOUNTER — Ambulatory Visit (INDEPENDENT_AMBULATORY_CARE_PROVIDER_SITE_OTHER): Payer: Medicare PPO | Admitting: Family Medicine

## 2013-07-20 ENCOUNTER — Encounter: Payer: Self-pay | Admitting: Family Medicine

## 2013-07-20 VITALS — BP 98/65 | HR 53 | Temp 97.4°F | Wt 167.6 lb

## 2013-07-20 DIAGNOSIS — E785 Hyperlipidemia, unspecified: Secondary | ICD-10-CM

## 2013-07-20 DIAGNOSIS — I2119 ST elevation (STEMI) myocardial infarction involving other coronary artery of inferior wall: Secondary | ICD-10-CM

## 2013-07-20 NOTE — Progress Notes (Signed)
   Subjective:    Patient ID: Dustin Bauer, male    DOB: August 06, 1945, 68 y.o.   MRN: [id number]  HPI Pt is here today for follow up due to recent hospitalization for a STEMI. Pt had one stent placement and is doing well. He is to followup with a cardiologist next week. He continues to have some fatigue and occasional shortness of breath. He has not had any chest pain palpitations et Ronney Asters. He looks good and feels good today. He has some concerns about his blood pressure running low his heart rate running low. He does not currently check or monitor his blood pressures at home.   Review of Systems  Constitutional: Positive for fatigue (intermitent).  Respiratory: Positive for shortness of breath (occasional).   Cardiovascular: Negative for chest pain, palpitations and leg swelling.       Objective:   Physical Exam  Nursing note and vitals reviewed. Constitutional: He is oriented to person, place, and time. He appears well-developed and well-nourished. No distress.  HENT:  Head: Normocephalic and atraumatic.  Eyes: Conjunctivae and EOM are normal. Pupils are equal, round, and reactive to light. Right eye exhibits no discharge. Left eye exhibits no discharge. No scleral icterus.  Neck: Normal range of motion. Neck supple. No thyromegaly present.  Cardiovascular: Normal rate, regular rhythm, normal heart sounds and intact distal pulses.   No murmur heard. At 60 per minute  Pulmonary/Chest: Effort normal and breath sounds normal. No respiratory distress. He has no wheezes. He has no rales. He exhibits no tenderness.  Abdominal: Soft. Bowel sounds are normal. He exhibits no mass. There is no tenderness. There is no rebound and no guarding.  Musculoskeletal: Normal range of motion. He exhibits no edema.  Lymphadenopathy:    He has no cervical adenopathy.  Neurological: He is alert and oriented to person, place, and time.  Skin: Skin is warm and dry. No rash noted. No erythema. No pallor.    Right groin cath site healing well  Psychiatric: He has a normal mood and affect. His behavior is normal. Judgment and thought content normal.   BP 98/65  Pulse 53  Temp(Src) 97.4 F (36.3 C) (Oral)  Wt 167 lb 9.6 oz (76.023 kg)        Assessment & Plan:   1. Acute MI inferior posterior subsequent episode care -Follow up with cardiologist as planned  2. Hyperlipidemia  Patient Instructions  Followup with cardiologist as planned Monitor blood pressures at least until you see the cardiologist and periodically after that Document how you are feeling when you check your blood pressure especially after feeling lightheaded or dizzy Continue to drink plenty of fluids Exercise as directed by the cardiologist   Arrie Senate MD

## 2013-07-20 NOTE — Patient Instructions (Signed)
Followup with cardiologist as planned Monitor blood pressures at least until you see the cardiologist and periodically after that Document how you are feeling when you check your blood pressure especially after feeling lightheaded or dizzy Continue to drink plenty of fluids Exercise as directed by the cardiologist

## 2013-07-27 ENCOUNTER — Encounter (HOSPITAL_COMMUNITY)
Admission: RE | Admit: 2013-07-27 | Discharge: 2013-07-27 | Disposition: A | Discharge status: Home / Self Care | Payer: Medicare FFS | Source: Ambulatory Visit | Attending: Cardiology | Admitting: Cardiology

## 2013-07-27 ENCOUNTER — Encounter (HOSPITAL_COMMUNITY): Payer: Self-pay

## 2013-07-27 VITALS — BP 124/86 | HR 43 | Ht 70.0 in | Wt 169.0 lb

## 2013-07-27 DIAGNOSIS — Z9861 Coronary angioplasty status: Secondary | ICD-10-CM

## 2013-07-27 DIAGNOSIS — I214 Non-ST elevation (NSTEMI) myocardial infarction: Secondary | ICD-10-CM

## 2013-07-27 NOTE — Progress Notes (Signed)
Patient was referred to CR by Dr. Terrence Dupont due to MI 410.70 and Stent placement V45.82. During orientation advised patient on arrival and appointment times what to wear, what to do before, during and after exercise. Reviewed attendance and class policy. Talked about inclement weather and class consultation policy. Pt is scheduled to start Cardiac Rehab on 08/03/13 at 11am. Pt was advised to come to class 5 minutes before class starts. He was also given instructions on meeting with the dietician and attending the Family Structure classes. Pt is eager to get started. Patient plans to give [place] a call after meeting with his heart doctor to confirm when or if he will start Cardiac Rehab. Patient was able to complete 6 minute walk test.

## 2013-07-27 NOTE — Patient Instructions (Signed)
Pt has finished orientation and is scheduled to start CR on 08/03/13 at 11 am. Pt has been instructed to arrive to class 15 minutes early for scheduled class. Pt has been instructed to wear comfortable clothing and shoes with rubber soles. Pt has been told to take their medications 1 hour prior to coming to class.  If the patient is not going to attend class, he/she has been instructed to call.

## 2013-09-07 ENCOUNTER — Telehealth: Payer: Self-pay | Admitting: Family Medicine

## 2013-09-08 MED ORDER — NITROGLYCERIN 0.4 MG SL SUBL: 0.4000 mg | SUBLINGUAL_TABLET | SUBLINGUAL | Status: DC | PRN

## 2013-09-08 NOTE — Telephone Encounter (Signed)
Please okay the nitroglycerin prescription for this patient

## 2013-09-08 NOTE — Telephone Encounter (Signed)
rx rf done

## 2013-10-21 ENCOUNTER — Other Ambulatory Visit: Payer: Self-pay | Admitting: Family Medicine

## 2013-11-30 ENCOUNTER — Ambulatory Visit (INDEPENDENT_AMBULATORY_CARE_PROVIDER_SITE_OTHER): Payer: Medicare PPO | Admitting: Family Medicine

## 2013-11-30 ENCOUNTER — Encounter: Payer: Self-pay | Admitting: Family Medicine

## 2013-11-30 ENCOUNTER — Encounter: Payer: Self-pay | Admitting: *Deleted

## 2013-11-30 VITALS — BP 110/67 | HR 74 | Temp 97.1°F | Ht 70.0 in | Wt 167.0 lb

## 2013-11-30 DIAGNOSIS — N400 Enlarged prostate without lower urinary tract symptoms: Secondary | ICD-10-CM

## 2013-11-30 DIAGNOSIS — E559 Vitamin D deficiency, unspecified: Secondary | ICD-10-CM

## 2013-11-30 DIAGNOSIS — R972 Elevated prostate specific antigen [PSA]: Secondary | ICD-10-CM

## 2013-11-30 DIAGNOSIS — J302 Other seasonal allergic rhinitis: Secondary | ICD-10-CM

## 2013-11-30 DIAGNOSIS — E785 Hyperlipidemia, unspecified: Secondary | ICD-10-CM

## 2013-11-30 DIAGNOSIS — I10 Essential (primary) hypertension: Secondary | ICD-10-CM

## 2013-11-30 DIAGNOSIS — R059 Cough, unspecified: Secondary | ICD-10-CM

## 2013-11-30 DIAGNOSIS — R05 Cough: Secondary | ICD-10-CM

## 2013-11-30 DIAGNOSIS — I2510 Atherosclerotic heart disease of native coronary artery without angina pectoris: Secondary | ICD-10-CM

## 2013-11-30 NOTE — Progress Notes (Signed)
Subjective:    Patient ID: Dustin Bauer, male    DOB: 04/01/1945, 68 y.o.   MRN: [id number]  HPI Pt here for follow up and management of chronic medical problems. The patient complains of head congestion and coughing. He will return to the office for fasting lab work. He will get his flu shot today.         Patient Active Problem List   Diagnosis Date Noted  . Acute MI, inferoposterior wall 07/15/2013  . Abnormal chest CT 05/30/2013  . Elevated PSA 05/30/2013  . ASCVD (arteriosclerotic cardiovascular disease) 06/24/2012  . Hyperlipidemia 06/24/2012  . Atrial fibrillation,history of 06/24/2012  . Statin intolerance 06/24/2012   Outpatient Encounter Prescriptions as of 11/30/2013  Medication Sig  . acetaminophen (TYLENOL) 325 MG tablet Take 325 mg by mouth at bedtime as needed (pain/sleep).  Marland Kitchen aspirin EC 81 MG tablet Take 162 mg by mouth daily.  . cholecalciferol (VITAMIN D) 1000 UNITS tablet Take 1,000 Units by mouth daily.  Marland Kitchen ezetimibe (ZETIA) 10 MG tablet Take 10 mg by mouth daily.  . hydrochlorothiazide (MICROZIDE) 12.5 MG capsule Take 12.5 mg by mouth daily.  Marland Kitchen MELATONIN PO Take 2 mg by mouth at bedtime.  . prasugrel (EFFIENT) 10 MG TABS tablet Take 1 tablet (10 mg total) by mouth daily.  Marland Kitchen Resveratrol 250 MG CAPS Take 250 mg by mouth daily.  . nitroGLYCERIN (NITROSTAT) 0.4 MG SL tablet Place 1 tablet (0.4 mg total) under the tongue every 5 (five) minutes as needed for chest pain.    Review of Systems  Constitutional: Negative.   HENT: Positive for postnasal drip.   Eyes: Negative.   Respiratory: Positive for cough (pt thinks its from yard work).   Cardiovascular: Negative.   Gastrointestinal: Negative.   Endocrine: Negative.   Genitourinary: Negative.   Musculoskeletal: Negative.   Skin: Negative.   Allergic/Immunologic: Negative.   Neurological: Negative.   Hematological: Negative.   Psychiatric/Behavioral: Negative.        Objective:   Physical Exam    Constitutional: He is oriented to person, place, and time. He appears well-developed and well-nourished. No distress.  HENT:  Head: Normocephalic and atraumatic.  Right Ear: External ear normal.  Left Ear: External ear normal.  Mouth/Throat: Oropharynx is clear and moist. No oropharyngeal exudate.  Nasal congestion bilaterally  Eyes: Conjunctivae and EOM are normal. Pupils are equal, round, and reactive to light. Right eye exhibits no discharge. Left eye exhibits no discharge. No scleral icterus.  Neck: Normal range of motion. Neck supple. No thyromegaly present.  Without bruits or adenopathy  Cardiovascular: Normal rate, regular rhythm, normal heart sounds and intact distal pulses.   No murmur heard. The heart has a regular rate and rhythm today between 56 and 60.  Pulmonary/Chest: Effort normal and breath sounds normal. No respiratory distress. He has no wheezes. He has no rales. He exhibits no tenderness.  Dry cough  Abdominal: Soft. Bowel sounds are normal. He exhibits no mass. There is no tenderness. There is no rebound and no guarding.  Musculoskeletal: Normal range of motion. He exhibits no edema.  Lymphadenopathy:    He has no cervical adenopathy.  Neurological: He is alert and oriented to person, place, and time.  Skin: Skin is warm and dry. No rash noted. No erythema. No pallor.  Psychiatric: He has a normal mood and affect. His behavior is normal. Judgment and thought content normal.  Nursing note and vitals reviewed.   BP 110/67 mmHg  Pulse  74  Temp(Src) 97.1 F (36.2 C) (Oral)  Ht 5' 10"  (1.778 m)  Wt 167 lb (75.751 kg)  BMI 23.96 kg/m2       Assessment & Plan:  1. Hyperlipidemia - NMR, lipoprofile; Future - CBC; Future  2. Essential hypertension - BMP8+EGFR; Future - Hepatic function panel; Future - CBC; Future  3. Elevated PSA - CBC; Future  4. ASCVD (arteriosclerotic cardiovascular disease) - CBC; Future  5. BPH (benign prostatic hypertrophy) -  CBC; Future  6. Vitamin D deficiency - Vit D  25 hydroxy (rtn osteoporosis monitoring); Future - CBC; Future  7. Cough  8. Other seasonal allergic rhinitis  Patient Instructions                       Medicare Annual Wellness Visit  [facility] and the medical providers at [facility] strive to bring you the best medical care.  In doing so we not only want to address your current medical conditions and concerns but also to detect new conditions early and prevent illness, disease and health-related problems.    Medicare offers a yearly Wellness Visit which allows our clinical staff to assess your need for preventative services including immunizations, lifestyle education, counseling to decrease risk of preventable diseases and screening for fall risk and other medical concerns.    This visit is provided free of charge (no copay) for all Medicare recipients. The clinical pharmacists at [facility] have begun to conduct these Wellness Visits which will also include a thorough review of all your medications.    As you primary medical provider recommend that you make an appointment for your Annual Wellness Visit if you have not done so already this year.  You may set up this appointment before you leave today or you may call back [phone]) and schedule an appointment.  Please make sure when you call that you mention that you are scheduling your Annual Wellness Visit with the clinical pharmacist so that the appointment may be made for the proper length of time.     Continue current medications. Continue good therapeutic lifestyle changes which include good diet and exercise. Fall precautions discussed with patient. If an FOBT was given today- please return it to our front desk. If you are over 86 years old - you may need Prevnar 65 or the adult Pneumonia vaccine.  Flu Shots will be available at our office starting mid- September. Please call and  schedule a FLU CLINIC APPOINTMENT.   I spoke with the cardiologist today in [place] and he has agreed to follow this patient in the future. Take the Mucinex maximum strength without decongestant one twice daily for cough and congestion as directed Use saline nose spray for head congestion If he starts running a fever or coughing up yellow sputum, call us back and we may need to give you an antibiotic If you do not hear from the cardiologist please call us back    Arrie Senate MD

## 2013-11-30 NOTE — Progress Notes (Signed)
This is the information about this pt that you requested from Dr Laurance Flatten

## 2013-11-30 NOTE — Progress Notes (Signed)
This is the patient who needs appt

## 2013-11-30 NOTE — Patient Instructions (Addendum)
Medicare Annual Wellness Visit  [facility] and the medical providers at [facility] strive to bring you the best medical care.  In doing so we not only want to address your current medical conditions and concerns but also to detect new conditions early and prevent illness, disease and health-related problems.    Medicare offers a yearly Wellness Visit which allows our clinical staff to assess your need for preventative services including immunizations, lifestyle education, counseling to decrease risk of preventable diseases and screening for fall risk and other medical concerns.    This visit is provided free of charge (no copay) for all Medicare recipients. The clinical pharmacists at [facility] have begun to conduct these Wellness Visits which will also include a thorough review of all your medications.    As you primary medical provider recommend that you make an appointment for your Annual Wellness Visit if you have not done so already this year.  You may set up this appointment before you leave today or you may call back [phone]) and schedule an appointment.  Please make sure when you call that you mention that you are scheduling your Annual Wellness Visit with the clinical pharmacist so that the appointment may be made for the proper length of time.     Continue current medications. Continue good therapeutic lifestyle changes which include good diet and exercise. Fall precautions discussed with patient. If an FOBT was given today- please return it to our front desk. If you are over 27 years old - you may need Prevnar 73 or the adult Pneumonia vaccine.  Flu Shots will be available at our office starting mid- September. Please call and schedule a FLU CLINIC APPOINTMENT.   I spoke with the cardiologist today in [place] and he has agreed to follow this patient in the future. Take the Mucinex maximum strength without  decongestant one twice daily for cough and congestion as directed Use saline nose spray for head congestion If he starts running a fever or coughing up yellow sputum, call us back and we may need to give you an antibiotic If you do not hear from the cardiologist please call us back

## 2013-12-01 NOTE — Progress Notes (Signed)
appt scheduled with Dr Orpah Greek advised.

## 2013-12-05 ENCOUNTER — Telehealth: Payer: Self-pay | Admitting: Family Medicine

## 2013-12-05 MED ORDER — AZITHROMYCIN 250 MG PO TABS: ORAL_TABLET | ORAL | Status: DC

## 2013-12-05 NOTE — Telephone Encounter (Signed)
Pt called in today stating he was seen by dr Laurance Flatten, in visit notes it states if the pt doesn't get better may call back for anbx, pt isn't feeling better, z-pak sent it into [facility] in [place].

## 2013-12-15 ENCOUNTER — Encounter: Payer: Self-pay | Admitting: *Deleted

## 2013-12-15 ENCOUNTER — Ambulatory Visit (INDEPENDENT_AMBULATORY_CARE_PROVIDER_SITE_OTHER): Payer: Medicare FFS | Admitting: *Deleted

## 2013-12-15 ENCOUNTER — Encounter: Payer: Self-pay | Admitting: Internal Medicine

## 2013-12-15 ENCOUNTER — Ambulatory Visit (INDEPENDENT_AMBULATORY_CARE_PROVIDER_SITE_OTHER): Payer: Medicare FFS | Admitting: Cardiology

## 2013-12-15 ENCOUNTER — Ambulatory Visit (HOSPITAL_COMMUNITY): Payer: Medicare FFS | Attending: Internal Medicine

## 2013-12-15 VITALS — BP 126/82 | HR 51 | Ht 70.0 in | Wt 166.0 lb

## 2013-12-15 DIAGNOSIS — I2510 Atherosclerotic heart disease of native coronary artery without angina pectoris: Secondary | ICD-10-CM | POA: Diagnosis not present

## 2013-12-15 DIAGNOSIS — E785 Hyperlipidemia, unspecified: Secondary | ICD-10-CM

## 2013-12-15 DIAGNOSIS — Z87891 Personal history of nicotine dependence: Secondary | ICD-10-CM | POA: Insufficient documentation

## 2013-12-15 DIAGNOSIS — I480 Paroxysmal atrial fibrillation: Secondary | ICD-10-CM

## 2013-12-15 DIAGNOSIS — I4891 Unspecified atrial fibrillation: Secondary | ICD-10-CM

## 2013-12-15 LAB — CBC WITH DIFFERENTIAL/PLATELET
Basophils Absolute: 0.1 10*3/uL (ref 0.0–0.1)
Basophils Relative: 0.6 % (ref 0.0–3.0)
EOS ABS: 0.5 10*3/uL (ref 0.0–0.7)
Eosinophils Relative: 4.8 % (ref 0.0–5.0)
HEMATOCRIT: 42.5 % (ref 39.0–52.0)
HEMOGLOBIN: 14.2 g/dL (ref 13.0–17.0)
LYMPHS PCT: 33.4 % (ref 12.0–46.0)
Lymphs Abs: 3.1 10*3/uL (ref 0.7–4.0)
MCHC: 33.5 g/dL (ref 30.0–36.0)
MCV: 92.6 fl (ref 78.0–100.0)
MONOS PCT: 6.5 % (ref 3.0–12.0)
Monocytes Absolute: 0.6 10*3/uL (ref 0.1–1.0)
NEUTROS ABS: 5.1 10*3/uL (ref 1.4–7.7)
Neutrophils Relative %: 54.7 % (ref 43.0–77.0)
Platelets: 247 10*3/uL (ref 150.0–400.0)
RBC: 4.59 Mil/uL (ref 4.22–5.81)
RDW: 15.8 % — ABNORMAL HIGH (ref 11.5–15.5)
WBC: 9.4 10*3/uL (ref 4.0–10.5)

## 2013-12-15 LAB — MDC_IDC_ENUM_SESS_TYPE_INCLINIC

## 2013-12-15 MED ORDER — RAMIPRIL 2.5 MG PO CAPS: 2.5000 mg | ORAL_CAPSULE | Freq: Every day | ORAL | Status: DC

## 2013-12-15 NOTE — Patient Instructions (Addendum)
Your physician recommends that you have  lab work today--BMET/Lipid profile/CBCd  Your physician has requested that you have an echocardiogram. Echocardiography is a painless test that uses sound waves to create images of your heart. It provides your doctor with information about the size and shape of your heart and how well your heart's chambers and valves are working. This procedure takes approximately one hour. There are no restrictions for this procedure. TODAY at [facility]  Start Ramipril 2.5mg  daily.  Elberta Leatherwood, pharmacist will be in touch with you about new cholesterol medication--Repatha or Praulent.  Your physician recommends that you schedule a follow-up appointment 3 months with Dr Rayann Heman.  Your physician wants you to follow-up in: 6 months with Dr Aundra Dubin. (June 2016). You will receive a reminder letter in the mail two months in advance. If you don't receive a letter, please call our office to schedule the follow-up appointment.

## 2013-12-15 NOTE — Progress Notes (Signed)
Loop check in clinic.  Pt with 0 tachy episodes; 0 brady episodes; 0 asystole: 129 A-fib episodes, + effient.  Episodes appear to be SR with PAC's.  Total burden 4.1%.  The patient was implanted @ [facility].  R-waves 0.68-0.85mV.

## 2013-12-15 NOTE — Progress Notes (Signed)
2D Echo completed. 12/15/2013 

## 2013-12-16 ENCOUNTER — Encounter: Payer: Self-pay | Admitting: Cardiology

## 2013-12-16 DIAGNOSIS — I2510 Atherosclerotic heart disease of native coronary artery without angina pectoris: Secondary | ICD-10-CM | POA: Insufficient documentation

## 2013-12-16 NOTE — Progress Notes (Signed)
Patient ID: Dustin Bauer, male   DOB: 14-Jun-1945, 68 y.o.   MRN: [id number] PCP: Dr. Laurance Flatten  68 yo with history of CAD and paroxysmal atrial fibrillation presents for cardiology evaluation.  He had initiation PCI to LAD in 2011 for stable angina.  He developed paroxysmal atrial fibrillation and had ablation at [facility] several years ago. He was initially anticoagulated but has not been on anticoagulation for about 2 years.  He has a Medtronic Reveal monitor and has not had recurrent atrial fibrillation documented.  In 7/15, he passed out while in the gym.  He was found to have complete heart block in the setting of an inferoposterior STEMI.  He had temporary transvenous pacemaker placed and had Xience DES to totally occluded RCA.  EF was 50-55% by LV-gram.  After PCI, heart block resolved.   Patient has been doing well since his MI.  He walks 4-5 miles at a time and works out at a gym.  No chest pain, no lightheadedness or syncope.  Mild dyspnea walking up hills.  No orthopnea or PND.  He has been intolerant to statins and Zetia, currently not on lipid-lowering therapy.   ECG: NSR at 80, old posterior MI.   Labs (7/15): K 4.1, creatinine 0.95  PMH: 1. CAD: He had LHC in 2011 for chronic stable angina, at that time had PCI with DES to proximal LAD [facility]).  In 7/15, he presented with syncope and inferoposterior STEMI.  He was found to have complete heart block with junctional escape.  LHC showed 60-70% OM1 stenosis, 30-40% proximal LAD, total occlusion of the mid to distal LAD with EF 50-55% by LV-gram.  He had temporary transvenous PCM placement and Xience DES to the RCA.   2. Hyperlipidemia: Myalgias with Zocor, Crestor, atorvastatin.  He had fatigue with Zetia.   3. Atrial fibrillation: s/p ablation at [facility] several years ago.  He was on coumadin then Xarelto.  Anticoagulation was stopped about 2 years ago.  He has a Medtronic Reveal monitor in place and has not had recurrent atrial  fibrillation.  4. Colon polyps  SH: Married, retired, lives in [place], 2 children. Quit smoking in 2002.   FH: Father with MI at 61, uncles with MIs in their 57s.  Grandfather with MI in his 58s.   ROS: All systems reviewed and negative except as per HPI.   Current Outpatient Prescriptions  Medication Sig Dispense Refill  . acetaminophen (TYLENOL) 325 MG tablet Take 325 mg by mouth at bedtime as needed (pain/sleep).    . cholecalciferol (VITAMIN D) 1000 UNITS tablet Take 1,000 Units by mouth daily.    Marland Kitchen MELATONIN PO Take 2 mg by mouth at bedtime.    . nitroGLYCERIN (NITROSTAT) 0.4 MG SL tablet Place 1 tablet (0.4 mg total) under the tongue every 5 (five) minutes as needed for chest pain. 25 tablet 11  . prasugrel (EFFIENT) 10 MG TABS tablet Take 1 tablet (10 mg total) by mouth daily. 30 tablet 3  . Resveratrol 250 MG CAPS Take 250 mg by mouth daily.    Marland Kitchen aspirin EC 81 MG tablet Take 1 tablet (81 mg total) by mouth daily.    . ramipril (ALTACE) 2.5 MG capsule Take 1 capsule (2.5 mg total) by mouth daily. 30 capsule 6   No current facility-administered medications for this visit.   BP 126/82 mmHg  Pulse 51  Ht 5\' 10"  (1.778 m)  Wt 166 lb (75.297 kg)  BMI 23.82 kg/m2  General: NAD Neck: No JVD, no thyromegaly or thyroid nodule.  Lungs: Clear to auscultation bilaterally with normal respiratory effort. CV: Nondisplaced PMI.  Heart regular S1/S2, no S3/S4, no murmur.  No peripheral edema.  No carotid bruit.  Normal pedal pulses.  Abdomen: Soft, nontender, no hepatosplenomegaly, no distention.  Skin: Intact without lesions or rashes.  Neurologic: Alert and oriented x 3.  Psych: Normal affect. Extremities: No clubbing or cyanosis.  HEENT: Normal.   Assessment/Plan: 1. CAD: Stable s/p inferoposterior MI in 7/15.  EF 50-55% by LV-gram.  No chest pain or significant exertional dyspnea.  - I will arrange for echocardiogram to reassess LV EF and wall motion.  - Continue ASA 81 daily and  Effient 10 mg daily.  Will need to continue Effient for 1 year.  - I will add ramipril 2.5 mg daily for secondary prevention.  Avoid beta blocker with bradycardia. 2. History of complete heart block: This was due to acute inferoposterior MI.  It resolved, no rhythm problems since that time.   3. Hyperlipidemia: Check lipids today.  He has been unable to tolerate statins (simvastatin, atorvastatin, Crestor) due to myalgias.  He had profound fatigue with Zetia.  I will arrange for him to be evaluated in pharmacy clinic to see if we can get him a PCSK9-inhibitor.  4. Atrial fibrillation: Paroxysmal.  S/p ablatWe interrogated his Reveal monitor today.  He has had PACs but does not appear to have had definite atrial fibrillation.  He has been off anticoagulation x 2 years. If recurrent atrial fibrillation is documented, he will need to restart anticoagulation. He will followup in our device clinic.   Followup in 6 months.   Loralie Champagne 12/16/2013

## 2013-12-17 LAB — BASIC METABOLIC PANEL
BUN: 16 mg/dL (ref 6–23)
CALCIUM: 9.2 mg/dL (ref 8.4–10.5)
CO2: 25 mEq/L (ref 19–32)
Chloride: 107 mEq/L (ref 96–112)
Creatinine, Ser: 1 mg/dL (ref 0.4–1.5)
GFR: 76.12 mL/min (ref >60.00)
Glucose, Bld: 88 mg/dL (ref 70–99)
POTASSIUM: 4.2 meq/L (ref 3.5–5.1)
SODIUM: 139 meq/L (ref 135–145)

## 2013-12-17 LAB — LIPID PANEL
CHOL/HDL RATIO: 5
Cholesterol: 198 mg/dL (ref 0–200)
HDL: 38.2 mg/dL — ABNORMAL LOW (ref >39.00)
LDL Cholesterol: 142 mg/dL — ABNORMAL HIGH (ref 0–99)
NONHDL: 159.8
TRIGLYCERIDES: 87 mg/dL (ref 0.0–149.0)
VLDL: 17.4 mg/dL (ref 0.0–40.0)

## 2013-12-22 ENCOUNTER — Encounter (HOSPITAL_COMMUNITY): Payer: Self-pay | Admitting: Cardiology

## 2013-12-26 ENCOUNTER — Ambulatory Visit (INDEPENDENT_AMBULATORY_CARE_PROVIDER_SITE_OTHER): Payer: Medicare FFS | Admitting: Pharmacist

## 2013-12-26 VITALS — Wt 170.5 lb

## 2013-12-26 DIAGNOSIS — E785 Hyperlipidemia, unspecified: Secondary | ICD-10-CM

## 2013-12-26 NOTE — Progress Notes (Signed)
S/O: Mr. Dustin Bauer is a 68yo male with CAD referred to pharmacy clinic by Dr. Aundra Dubin d/t h/o statin intolerance (myalgias) and fatigue while on Zetia.  Referral to assess eligibility for PCSK9 inhibitor for Medicare pt.  Past Medical History  Diagnosis Date  . Hyperlipidemia   . Hyperplasia of prostate   . Nicotine abuse   . ASCVD (arteriosclerotic cardiovascular disease)   . Presence of drug coated stent in LAD coronary artery   . Atrial fibrillation   . Hydrocele, left   . Colon polyps   . A-fib   . Coronary artery disease   . Hypertension   . Hypothyroidism     Current Outpatient Prescriptions  Medication Sig Dispense Refill  . acetaminophen (TYLENOL) 325 MG tablet Take 325 mg by mouth at bedtime as needed (pain/sleep).    Marland Kitchen aspirin EC 81 MG tablet Take 1 tablet (81 mg total) by mouth daily.    . cholecalciferol (VITAMIN D) 1000 UNITS tablet Take 1,000 Units by mouth daily.    Marland Kitchen MELATONIN PO Take 2 mg by mouth at bedtime.    . nitroGLYCERIN (NITROSTAT) 0.4 MG SL tablet Place 1 tablet (0.4 mg total) under the tongue every 5 (five) minutes as needed for chest pain. 25 tablet 11  . prasugrel (EFFIENT) 10 MG TABS tablet Take 1 tablet (10 mg total) by mouth daily. 30 tablet 3  . ramipril (ALTACE) 2.5 MG capsule Take 1 capsule (2.5 mg total) by mouth daily. 30 capsule 6  . Resveratrol 250 MG CAPS Take 250 mg by mouth daily.     No current facility-administered medications for this visit.    Family History: Father with MI at 49yo, Uncles with MIs in their 41s, Grandfather with MI in his 80s.  Lipid Panel December 2015 (no meds): Total Chol: 198 Trig: 87 HDL: 38 LDL: 142 VLDL: 17  Lipid Panel July 2015 (on Zetia alone): Total Chol: 131 Trig:74 HDL: 40 LDL: 76 VLDL: 15  Pt reports previous use of Lipitor and Crestor caused severe muscle pain to the point of inability to get out of bed some days.  Symptoms resolved 2-3 weeks after D/C of statin.  He reports he tried them both  several years ago and does not wish to ever try statins again in the future.  He also previously tried [employer] and Zetia which may have caused some fatigue and muscle aches but also states that symptoms may have been due to other causes.  Typical Daily Diet: Breakfast: PB&J with banana, coffee - rare bacon and eggs Lunch: chicken sub, sandwiches, soups Dinner: Lots of fish (salmon, sardines), some ground bison, lean meats, veggies, generally home cooked, nothing fried Snacks: fruit, desserts  Exercise: Walks or hikes x1hr/day Cardio and weights at fitness center 2-3x/wk  A/P: Pt has elevated cholesterol now off all cholesterol-lowering meds.  Pt had about 50% reduction in LDL on Zetia alone, suggesting that pt may be a hyper-absorber of dietary cholesterol.  Discussed possibility of PCSK9 inhibitors but that those meds will quickly put Medicare pt in donut hole and make affordability of other meds (Effient) very high.  Pt agreeable to trying Zetia again considering lack of confidence that symptoms were d/t Zetia use and his good response in lipids with prior use.  Pt education completed and questions answered.  1) Start taking Zetia 5mg  (1/2 tab) daily 2) Call us if intolerable side effects return 3) Check f/u labs at Dr. Tawanna Sat office in 2-3 months  Drucie Opitz, PharmD  Clinical Pharmacy Resident Pager: [phone] 12/26/2013 3:26 PM

## 2013-12-26 NOTE — Patient Instructions (Signed)
Start Zetia 5mg  (1/2 tablet) every day.  If you have any problems, please call Gay Filler at [phone].    Recheck your labs with Dr. Tawanna Sat office at the end of February/first part of March.    We will follow up to see if you need to start an injectable drug after your labwork.

## 2014-01-14 ENCOUNTER — Other Ambulatory Visit: Payer: Self-pay | Admitting: Family Medicine

## 2014-01-19 ENCOUNTER — Other Ambulatory Visit: Payer: Self-pay | Admitting: Cardiology

## 2014-02-17 ENCOUNTER — Other Ambulatory Visit: Payer: Self-pay | Admitting: *Deleted

## 2014-02-17 DIAGNOSIS — R911 Solitary pulmonary nodule: Secondary | ICD-10-CM

## 2014-02-27 ENCOUNTER — Other Ambulatory Visit: Payer: Medicare FFS

## 2014-03-13 ENCOUNTER — Institutional Professional Consult (permissible substitution): Payer: Medicare FFS | Admitting: Internal Medicine

## 2014-03-28 ENCOUNTER — Other Ambulatory Visit: Payer: Medicare FFS

## 2014-03-28 ENCOUNTER — Telehealth: Payer: Self-pay | Admitting: Family Medicine

## 2014-03-28 NOTE — Telephone Encounter (Signed)
Attempted to contact patient at number provider but there was no answer. What type of MRI and for what reason?

## 2014-03-29 DIAGNOSIS — H40013 Open angle with borderline findings, low risk, bilateral: Secondary | ICD-10-CM | POA: Diagnosis not present

## 2014-03-29 DIAGNOSIS — H02834 Dermatochalasis of left upper eyelid: Secondary | ICD-10-CM | POA: Diagnosis not present

## 2014-03-29 DIAGNOSIS — H04123 Dry eye syndrome of bilateral lacrimal glands: Secondary | ICD-10-CM | POA: Diagnosis not present

## 2014-03-29 DIAGNOSIS — H02831 Dermatochalasis of right upper eyelid: Secondary | ICD-10-CM | POA: Diagnosis not present

## 2014-03-29 DIAGNOSIS — H2513 Age-related nuclear cataract, bilateral: Secondary | ICD-10-CM | POA: Diagnosis not present

## 2014-03-31 NOTE — Telephone Encounter (Signed)
Patient needs prior authoraztion for Ct scan at [facility] imaging. Please see referral from 2/6

## 2014-04-17 ENCOUNTER — Encounter: Payer: Self-pay | Admitting: Internal Medicine

## 2014-04-17 ENCOUNTER — Ambulatory Visit (INDEPENDENT_AMBULATORY_CARE_PROVIDER_SITE_OTHER): Payer: Medicare PPO | Admitting: Internal Medicine

## 2014-04-17 VITALS — BP 122/70 | HR 66 | Ht 70.0 in | Wt 166.2 lb

## 2014-04-17 DIAGNOSIS — I119 Hypertensive heart disease without heart failure: Secondary | ICD-10-CM | POA: Diagnosis not present

## 2014-04-17 DIAGNOSIS — I2510 Atherosclerotic heart disease of native coronary artery without angina pectoris: Secondary | ICD-10-CM | POA: Diagnosis not present

## 2014-04-17 DIAGNOSIS — I480 Paroxysmal atrial fibrillation: Secondary | ICD-10-CM

## 2014-04-17 LAB — MDC_IDC_ENUM_SESS_TYPE_INCLINIC

## 2014-04-17 NOTE — Progress Notes (Signed)
Electrophysiology Office Note   Date:  04/17/2014   ID:  [name], DOB [birth year], MRN [id number]  PCP:  Redge Gainer, MD  Cardiologist:  Dr Aundra Dubin Primary Electrophysiologist: [name], MD    Chief Complaint  Patient presents with  . Appointment    afib     History of Present Illness: Dustin Bauer is a 69 y.o. male who presents today for electrophysiology evaluation.   He has a h/o afib s/p ablation at [facility] by Dr [facility] in 2012.  He has had no symptoms of afib since that time.  He was initially anticoagulated but has not been on anticoagulation for about 2 years. He also had CAD.  He had PCI to LAD in 2011 for stable angina.  In 7/15, he passed out while in the gym.  He was found to have complete heart block in the setting of an inferoposterior STEMI.  He had temporary transvenous pacemaker placed and had Xience DES to totally occluded RCA.  EF was 50-55% by LV-gram.  After PCI, heart block resolved. Patient has been doing well since his MI.  He walks 4-5 miles at a time and works out at a gym.  No chest pain, no lightheadedness or syncope.  Mild dyspnea walking up hills.  No orthopnea or PND.   The patient is tolerating medications without difficulties and is otherwise without complaint today.    Past Medical History  Diagnosis Date  . Hyperlipidemia   . Hyperplasia of prostate   . Nicotine abuse   . ASCVD (arteriosclerotic cardiovascular disease)   . Presence of drug coated stent in LAD coronary artery   . Atrial fibrillation     s/p ablation by Dr Dustin Bauer  . Hydrocele, left   . Colon polyps   . Coronary artery disease   . Hypertension   . Hypothyroidism    Past Surgical History  Procedure Laterality Date  . Tonsillectomy and adenoidectomy    . Cardiac catheterization    . Coronary stent placement    . Left heart cath N/A 07/15/2013    Procedure: LEFT HEART CATH;  Surgeon: Dustin Demark, MD;  Location: [facility] CATH LAB;  Service: Cardiovascular;   Laterality: N/A;  . Atrial fibrillation ablation  07/2010    Afib ablation by Dr Dustin B[facility] at [facility] in 2012  . Implantable loop recorder implantation  08/13/2010    MDT Reveal XT implanted by Dr Dustin Bauer for afib management post ablation     Current Outpatient Prescriptions  Medication Sig Dispense Refill  . acetaminophen (TYLENOL) 325 MG tablet Take 325 mg by mouth at bedtime as needed (pain/sleep).    Marland Kitchen aspirin EC 81 MG tablet Take 1 tablet (81 mg total) by mouth daily.    . cholecalciferol (VITAMIN D) 1000 UNITS tablet Take 1,000 Units by mouth daily. Pt states he does not take the every day (04/17/14)    . EFFIENT 10 MG TABS tablet TAKE 1 TABLET (10 MG TOTAL) BY MOUTH DAILY. 30 tablet 11  . ezetimibe (ZETIA) 10 MG tablet Take 10 mg by mouth daily.    Marland Kitchen MELATONIN PO Take 2 mg by mouth at bedtime.    . nitroGLYCERIN (NITROSTAT) 0.4 MG SL tablet Place 1 tablet (0.4 mg total) under the tongue every 5 (five) minutes as needed for chest pain. (Patient taking differently: Place 0.4 mg under the tongue every 5 (five) minutes as needed for chest pain (MAX 3 TABLETS). ) 25 tablet 11  . ramipril (ALTACE)  2.5 MG capsule Take 1 capsule (2.5 mg total) by mouth daily. 30 capsule 6  . Resveratrol 250 MG CAPS Take 250 mg by mouth daily.     No current facility-administered medications for this visit.    Allergies:   Crestor; Lipitor; and Statins   Social History:  The patient  reports that he quit smoking about 10 years ago. His smoking use included Cigarettes. He smoked 0.25 packs per day. He does not have any smokeless tobacco history on file. He reports that he drinks about 0.6 oz of alcohol per week. He reports that he does not use illicit drugs.   Family History:  The patient's family history includes Dementia in his mother; Heart disease in his father; Hyperlipidemia in his father.    ROS:  Please see the history of present illness.   All other systems are reviewed and negative.     PHYSICAL EXAM: VS:  BP 122/70 mmHg  Pulse 66  Ht 5\' 10"  (1.778 m)  Wt 166 lb 3.2 oz (75.388 kg)  BMI 23.85 kg/m2 , BMI Body mass index is 23.85 kg/(m^2). GEN: Well nourished, well developed, in no acute distress HEENT: normal Neck: no JVD, carotid bruits, or masses Cardiac: RRR; no murmurs, rubs, or gallops,no edema  Respiratory:  clear to auscultation bilaterally, normal work of breathing GI: soft, nontender, nondistended, + BS MS: no deformity or atrophy Skin: warm and dry, device pocket is well healed Neuro:  Strength and sensation are intact Psych: euthymic mood, full affect  EKG:  EKG 12/15 is reviwed and reveals sinus bradycardia with pacs, first degree AV block Echo is also reviewed- EF is preserved  Device interrogation is reviewed today in detail.  See PaceArt for details.   Recent Labs: 07/15/2013: ALT 24; TSH 0.979 12/15/2013: BUN 16; Creatinine 1.0; Hemoglobin 14.2; Platelets 247.0; Potassium 4.2; Sodium 139    Lipid Panel     Component Value Date/Time   CHOL 198 12/15/2013 1416   CHOL 157 05/30/2013 1441   TRIG 87.0 12/15/2013 1416   HDL 38.20* 12/15/2013 1416   HDL 48 05/30/2013 1441   CHOLHDL 5 12/15/2013 1416   CHOLHDL 3.3 05/30/2013 1441   VLDL 17.4 12/15/2013 1416   LDLCALC 142* 12/15/2013 1416   LDLCALC 87 05/30/2013 1441     Wt Readings from Last 3 Encounters:  04/17/14 166 lb 3.2 oz (75.388 kg)  12/26/13 170 lb 8 oz (77.338 kg)  12/15/13 166 lb (75.297 kg)      Other studies Reviewed: Additional studies/ records that were reviewed today include: Dr Dustin Bauer notes   ASSESSMENT AND PLAN:  1.  Paroxysmal atrial fibrillation Doing well s/p ablation without symptoms of afib ILR reveals low afib burden though he does have occasional episodes chasd2vasc score is at least 3.  Continue effient for now.  Once his 12 months post PCI, it may be best to stop effient and return to anticoagulation.  I will defer this decision to Dr Aundra Dubin. His ILR  battery is at relative replacement.  We will keep it in place as long as we can download data from it.  Once his device no longer functions we will remove it.  2. HTN Stable No change required today  3. CAD No ischemic symptoms No changes today  Current medicines are reviewed at length with the patient today.   The patient does not have concerns regarding his medicines.  The following changes were made today:  none  Follow-up: with Dr Aundra Dubin as scheduled,  I will see in a year  Signed, [name], MD  04/17/2014 3:32 PM     [address] [place] [address] [phone] (office) [phone] (fax)

## 2014-04-17 NOTE — Patient Instructions (Signed)
Your physician wants you to follow-up in: 12 months with Dr Vallery Ridge will receive a reminder letter in the mail two months in advance. If you don't receive a letter, please call our office to schedule the follow-up appointment.  Remote monitoring is used to monitor your Pacemaker of ICD from home. This monitoring reduces the number of office visits required to check your device to one time per year. It allows [place] to keep an eye on the functioning of your device to ensure it is working properly. You are scheduled for a device check from home on 07/18/14. You may send your transmission at any time that day. If you have a wireless device, the transmission will be sent automatically. After your physician reviews your transmission, you will receive a postcard with your next transmission date.

## 2014-04-24 ENCOUNTER — Ambulatory Visit
Admission: RE | Admit: 2014-04-24 | Discharge: 2014-04-24 | Disposition: A | Discharge status: Home / Self Care | Payer: Medicare PPO | Source: Ambulatory Visit | Attending: Family Medicine | Admitting: Family Medicine

## 2014-04-24 DIAGNOSIS — R911 Solitary pulmonary nodule: Secondary | ICD-10-CM

## 2014-06-01 ENCOUNTER — Ambulatory Visit (INDEPENDENT_AMBULATORY_CARE_PROVIDER_SITE_OTHER): Payer: Medicare PPO | Admitting: Cardiology

## 2014-06-01 ENCOUNTER — Encounter: Payer: Self-pay | Admitting: Family Medicine

## 2014-06-01 ENCOUNTER — Encounter: Payer: Self-pay | Admitting: Cardiology

## 2014-06-01 ENCOUNTER — Ambulatory Visit (INDEPENDENT_AMBULATORY_CARE_PROVIDER_SITE_OTHER): Payer: Medicare PPO | Admitting: Family Medicine

## 2014-06-01 VITALS — BP 120/70 | HR 45 | Ht 70.0 in | Wt 170.0 lb

## 2014-06-01 VITALS — BP 139/86 | HR 48 | Temp 96.9°F | Ht 70.0 in | Wt 169.0 lb

## 2014-06-01 DIAGNOSIS — E785 Hyperlipidemia, unspecified: Secondary | ICD-10-CM

## 2014-06-01 DIAGNOSIS — I480 Paroxysmal atrial fibrillation: Secondary | ICD-10-CM | POA: Diagnosis not present

## 2014-06-01 DIAGNOSIS — E559 Vitamin D deficiency, unspecified: Secondary | ICD-10-CM | POA: Diagnosis not present

## 2014-06-01 DIAGNOSIS — I10 Essential (primary) hypertension: Secondary | ICD-10-CM

## 2014-06-01 DIAGNOSIS — N433 Hydrocele, unspecified: Secondary | ICD-10-CM | POA: Diagnosis not present

## 2014-06-01 DIAGNOSIS — I2510 Atherosclerotic heart disease of native coronary artery without angina pectoris: Secondary | ICD-10-CM

## 2014-06-01 DIAGNOSIS — N400 Enlarged prostate without lower urinary tract symptoms: Secondary | ICD-10-CM

## 2014-06-01 DIAGNOSIS — R972 Elevated prostate specific antigen [PSA]: Secondary | ICD-10-CM | POA: Diagnosis not present

## 2014-06-01 LAB — LIPID PANEL
CHOLESTEROL: 153 mg/dL (ref 0–200)
HDL: 47.1 mg/dL (ref >39.00)
LDL Cholesterol: 94 mg/dL (ref 0–99)
NonHDL: 105.9
Total CHOL/HDL Ratio: 3
Triglycerides: 61 mg/dL (ref 0.0–149.0)
VLDL: 12.2 mg/dL (ref 0.0–40.0)

## 2014-06-01 LAB — CBC WITH DIFFERENTIAL/PLATELET
BASOS ABS: 0.1 10*3/uL (ref 0.0–0.1)
Basophils Relative: 0.7 % (ref 0.0–3.0)
EOS ABS: 0.4 10*3/uL (ref 0.0–0.7)
Eosinophils Relative: 5.6 % — ABNORMAL HIGH (ref 0.0–5.0)
HCT: 42.6 % (ref 39.0–52.0)
HEMOGLOBIN: 14.6 g/dL (ref 13.0–17.0)
Lymphocytes Relative: 25.4 % (ref 12.0–46.0)
Lymphs Abs: 1.9 10*3/uL (ref 0.7–4.0)
MCHC: 34.2 g/dL (ref 30.0–36.0)
MCV: 91.3 fl (ref 78.0–100.0)
MONO ABS: 0.7 10*3/uL (ref 0.1–1.0)
Monocytes Relative: 8.8 % (ref 3.0–12.0)
NEUTROS ABS: 4.5 10*3/uL (ref 1.4–7.7)
Neutrophils Relative %: 59.5 % (ref 43.0–77.0)
Platelets: 203 10*3/uL (ref 150.0–400.0)
RBC: 4.66 Mil/uL (ref 4.22–5.81)
RDW: 15.1 % (ref 11.5–15.5)
WBC: 7.6 10*3/uL (ref 4.0–10.5)

## 2014-06-01 LAB — POCT URINALYSIS DIPSTICK
Bilirubin, UA: NEGATIVE
GLUCOSE UA: NEGATIVE
Ketones, UA: NEGATIVE
LEUKOCYTES UA: NEGATIVE
NITRITE UA: NEGATIVE
PH UA: 5
Protein, UA: NEGATIVE
Spec Grav, UA: >=1.03
UROBILINOGEN UA: NEGATIVE

## 2014-06-01 LAB — BASIC METABOLIC PANEL
BUN: 18 mg/dL (ref 6–23)
CHLORIDE: 107 meq/L (ref 96–112)
CO2: 29 mEq/L (ref 19–32)
Calcium: 9.8 mg/dL (ref 8.4–10.5)
Creatinine, Ser: 0.96 mg/dL (ref 0.40–1.50)
GFR: 82.45 mL/min (ref >60.00)
Glucose, Bld: 88 mg/dL (ref 70–99)
POTASSIUM: 4.6 meq/L (ref 3.5–5.1)
Sodium: 139 mEq/L (ref 135–145)

## 2014-06-01 LAB — POCT UA - MICROSCOPIC ONLY
BACTERIA, U MICROSCOPIC: NEGATIVE
CASTS, UR, LPF, POC: NEGATIVE
CRYSTALS, UR, HPF, POC: NEGATIVE
MUCUS UA: NEGATIVE
WBC, Ur, HPF, POC: NEGATIVE
YEAST UA: NEGATIVE

## 2014-06-01 MED ORDER — LOSARTAN POTASSIUM 25 MG PO TABS: 25.0000 mg | ORAL_TABLET | Freq: Every day | ORAL | Status: DC

## 2014-06-01 MED ORDER — NITROGLYCERIN 0.4 MG SL SUBL: 0.4000 mg | SUBLINGUAL_TABLET | SUBLINGUAL | Status: DC | PRN

## 2014-06-01 MED ORDER — RIVAROXABAN 20 MG PO TABS: 20.0000 mg | ORAL_TABLET | Freq: Every day | ORAL | Status: DC

## 2014-06-01 NOTE — Progress Notes (Signed)
Patient ID: Dustin Bauer, male   DOB: 08-Apr-1945, 69 y.o.   MRN: [id number] PCP: Dr. Laurance Flatten  69 yo with history of CAD and paroxysmal atrial fibrillation presents for cardiology evaluation.  He had initial PCI to LAD in 2011 for stable angina.  He developed paroxysmal atrial fibrillation and had ablation at [facility] several years ago. He was initially anticoagulated but has been off anticoagulation for several years.  He has a Medtronic Reveal ILR.  In 7/15, he passed out while in the gym.  He was found to have complete heart block in the setting of an inferoposterior STEMI.  He had temporary transvenous pacemaker placed and had Xience DES to totally occluded RCA.  EF was 50-55% by LV-gram.  After PCI, heart block resolved.   Since last appointment, patient was started on Zetia (intolerant of statins).  This has not caused any problems.  A few weeks ago, he developed a dry cough and sensation of decreased energy/fatigue.  He stopped ramipril and the symptoms completely resolved.  Currently, he is feeling good.  No exertional dyspnea or chest pain.  He walks 1 mile/day.    ILR has shown occasional atrial fibrillation episodes, overall low burden.  No stroke-like symptoms.                                                                                                                                   Labs (7/15): K 4.1, creatinine 0.95 Labs (12/15): K 4.2, creatinine 1.0, LDL 142, HDL 38  PMH: 1. CAD: He had LHC in 2011 for chronic stable angina, at that time had PCI with DES to proximal LAD [facility]).  In 7/15, he presented with syncope and inferoposterior STEMI.  He was found to have complete heart block with junctional escape.  LHC showed 60-70% OM1 stenosis, 30-40% proximal LAD, total occlusion of the mid to distal LAD with EF 50-55% by LV-gram.  He had temporary transvenous PCM placement and Xience DES to the RCA.  Echo (12/15) with EF 60-65%, mild LVH, mildly dilated RV with normal systolic  function.  2. Hyperlipidemia: Myalgias with Zocor, Crestor, atorvastatin.   3. Atrial fibrillation: s/p ablation at [facility] several years ago.  He was on coumadin then Xarelto.  Anticoagulation was stopped about 2 years ago.  He has a Medtronic Reveal monitor in place that has shown recurrent atrial fibrillation.  4. Colon polyps 5. Cough and fatigue with ACEI  SH: Married, retired, lives in [place], 2 children. Quit smoking in 2002.   FH: Father with MI at 33, uncles with MIs in their 37s.  Grandfather with MI in his 56s.   ROS: All systems reviewed and negative except as per HPI.   Current Outpatient Prescriptions  Medication Sig Dispense Refill  . acetaminophen (TYLENOL) 325 MG tablet Take 325 mg by mouth at bedtime as needed (pain/sleep).    Marland Kitchen aspirin EC 81 MG tablet Take  1 tablet (81 mg total) by mouth daily.    . cholecalciferol (VITAMIN D) 1000 UNITS tablet Take 1,000 Units by mouth daily. Pt states he does not take the every day (04/17/14)    . ezetimibe (ZETIA) 10 MG tablet Take 10 mg by mouth daily.    Marland Kitchen MELATONIN PO Take 2 mg by mouth at bedtime.    Marland Kitchen Resveratrol 250 MG CAPS Take 250 mg by mouth daily.    Marland Kitchen losartan (COZAAR) 25 MG tablet Take 1 tablet (25 mg total) by mouth daily. 90 tablet 3  . nitroGLYCERIN (NITROSTAT) 0.4 MG SL tablet Place 1 tablet (0.4 mg total) under the tongue every 5 (five) minutes as needed for chest pain. 25 tablet 11  . prasugrel (EFFIENT) 10 MG TABS tablet Take 1 tablet (10 mg total) by mouth daily.     No current facility-administered medications for this visit.   BP 120/70 mmHg  Pulse 45  Ht 5\' 10"  (1.778 m)  Wt 170 lb (77.111 kg)  BMI 24.39 kg/m2  SpO2 99% General: NAD Neck: No JVD, no thyromegaly or thyroid nodule.  Lungs: Clear to auscultation bilaterally with normal respiratory effort. CV: Nondisplaced PMI.  Heart regular S1/S2, no S3/S4, no murmur.  No peripheral edema.  No carotid bruit.  Normal pedal pulses.  Abdomen: Soft,  nontender, no hepatosplenomegaly, no distention.  Skin: Intact without lesions or rashes.  Neurologic: Alert and oriented x 3.  Psych: Normal affect. Extremities: No clubbing or cyanosis.  HEENT: Normal.   Assessment/Plan: 1. CAD: Stable s/p inferoposterior MI in 7/15.  EF 50-55% by LV-gram.  Echo in 12/15 with EF 60-65%.  No chest pain or significant exertional dyspnea.  - Continue ASA 81 daily and Effient 10 mg daily.  Will need to continue Effient for 1 year => 7/16.  - Did not tolerate ACEI with cough and fatigue => now off ramipril.  Avoid beta blocker with bradycardia.  He will try replacing the ramipril with losartan 25 mg daily (secondary prevention of CAD).   - BMET today.  2. History of complete heart block: This was due to acute inferoposterior MI.  It resolved, no rhythm problems since that time.   3. Hyperlipidemia: Check lipids today.  He has been unable to tolerate statins (simvastatin, atorvastatin, Crestor) due to myalgias.  He has had no side effects so far on Zetia.  If LDL remains significantly elevated on Zetia, will consider a PCSK9-inhibitor.  4. Atrial fibrillation: Paroxysmal.  S/p ablation.  Reveal ILR has shown brief runs of atrial fibrillation, overall low burden. CHADSVASC = 3.  When he finishes Effient in 7/16, he will start on Eliquis 5 mg bid (start 5 days after stopping Effient).    Followup in 6 months.   Loralie Champagne 06/01/2014

## 2014-06-01 NOTE — Patient Instructions (Signed)
Medication Instructions:  Start losartan 25mg  daily, this will be in the place of ramipril.  July 15,2016 stop Effient--5 days later July 20,2016 start Xarelto 20mg  daily. I have given you a written prescription for this.   Labwork: Lipid profile/BMET/CBCd today  Testing/Procedures: None today  Follow-Up: Your physician wants you to follow-up in: 6 months with Dr Aundra Dubin. (November 2016).  You will receive a reminder letter in the mail two months in advance. If you don't receive a letter, please call our office to schedule the follow-up appointment.

## 2014-06-01 NOTE — Addendum Note (Signed)
Addended by: Zannie Cove on: 06/01/2014 03:04 PM   Modules accepted: Orders

## 2014-06-01 NOTE — Progress Notes (Signed)
Subjective:    Patient ID: Dustin Bauer, male    DOB: [birth year], 69 y.o.   MRN: [id number]  HPI Pt here for follow up and management of chronic medical problems which includes hypertension and hyperlipidemia. He is taking medications regularly. The patient just saw the cardiologist this morning and has a regular follow-up appointment scheduled with him. He does describe some cold intolerance. He is due to get lab work today and to return an FOBT. He denies chest pain shortness of breath trouble with his GI tract or trouble voiding.      Patient Active Problem List   Diagnosis Date Noted  . Hypertensive cardiovascular disease 04/17/2014  . CAD (coronary artery disease) 12/16/2013  . Acute MI, inferoposterior wall 07/15/2013  . Abnormal chest CT 05/30/2013  . Elevated PSA 05/30/2013  . ASCVD (arteriosclerotic cardiovascular disease) 06/24/2012  . Hyperlipidemia 06/24/2012  . Paroxysmal atrial fibrillation 06/24/2012  . Statin intolerance 06/24/2012   Outpatient Encounter Prescriptions as of 06/01/2014  Medication Sig  . acetaminophen (TYLENOL) 325 MG tablet Take 325 mg by mouth at bedtime as needed (pain/sleep).  Marland Kitchen aspirin EC 81 MG tablet Take 1 tablet (81 mg total) by mouth daily.  . cholecalciferol (VITAMIN D) 1000 UNITS tablet Take 1,000 Units by mouth daily. Pt states he does not take the every day (04/17/14)  . ezetimibe (ZETIA) 10 MG tablet Take 10 mg by mouth daily.  Marland Kitchen losartan (COZAAR) 25 MG tablet Take 1 tablet (25 mg total) by mouth daily.  Marland Kitchen MELATONIN PO Take 2 mg by mouth at bedtime.  . nitroGLYCERIN (NITROSTAT) 0.4 MG SL tablet Place 1 tablet (0.4 mg total) under the tongue every 5 (five) minutes as needed for chest pain. (Patient taking differently: Place 0.4 mg under the tongue every 5 (five) minutes as needed for chest pain (MAX 3 TABLETS). )  . prasugrel (EFFIENT) 10 MG TABS tablet Take 1 tablet (10 mg total) by mouth daily.  Marland Kitchen Resveratrol 250 MG CAPS Take 250 mg by  mouth daily.   No facility-administered encounter medications on file as of 06/01/2014.      Review of Systems  Constitutional: Negative.   HENT: Negative.   Eyes: Negative.   Respiratory: Negative.   Cardiovascular: Negative.   Gastrointestinal: Negative.   Endocrine: Positive for cold intolerance.  Genitourinary: Negative.   Musculoskeletal: Negative.   Skin: Negative.   Allergic/Immunologic: Negative.   Neurological: Negative.   Hematological: Negative.   Psychiatric/Behavioral: Negative.        Objective:   Physical Exam  Constitutional: He is oriented to person, place, and time. He appears well-developed and well-nourished. No distress.  HENT:  Head: Normocephalic and atraumatic.  Right Ear: External ear normal.  Left Ear: External ear normal.  Nose: Nose normal.  Mouth/Throat: Oropharynx is clear and moist. No oropharyngeal exudate.  Eyes: Conjunctivae and EOM are normal. Pupils are equal, round, and reactive to light. Right eye exhibits no discharge. Left eye exhibits no discharge. No scleral icterus.  Neck: Normal range of motion. Neck supple. No thyromegaly present.  No carotid bruits or thyroid enlargement  Cardiovascular: Normal rate, regular rhythm, normal heart sounds and intact distal pulses.   No murmur heard. At 60/m  Pulmonary/Chest: Effort normal and breath sounds normal. No respiratory distress. He has no wheezes. He has no rales. He exhibits no tenderness.  Clear anteriorly and posteriorly  Abdominal: Soft. Bowel sounds are normal. He exhibits no mass. There is no tenderness. There is no rebound  and no guarding.  No abdominal bruits  Genitourinary: Rectum normal and penis normal.  The prostate is enlarged but soft and smooth without masses or lumps. There are no rectal masses. The patient has a hydrocele on the left and this is been stable for him. There are no inguinal hernias or inguinal nodes.  Musculoskeletal: Normal range of motion. He exhibits no  edema.  Lymphadenopathy:    He has no cervical adenopathy.  Neurological: He is alert and oriented to person, place, and time. He has normal reflexes. No cranial nerve deficit.  Skin: Skin is warm and dry. No rash noted. No erythema. No pallor.  Psychiatric: He has a normal mood and affect. His behavior is normal. Judgment and thought content normal.  Nursing note and vitals reviewed.  BP 139/86 mmHg  Pulse 48  Temp(Src) 96.9 F (36.1 C) (Oral)  Ht 5\' 10"  (1.778 m)  Wt 169 lb (76.658 kg)  BMI 24.25 kg/m2        Assessment & Plan:  1. Hyperlipidemia -The patient is statin intolerant and has to continue with [place] in therapeutic lifestyle changes with diet and exercise  2. Essential hypertension -The blood pressure was good today at 124/78 in the right arm sitting. - Hepatic function panel  3. Elevated PSA -This issue has been resolved as the patient made several visits to see the urologist and no further visits were scheduled so we will follow-up on this today to make sure things are stable. - PSA, total and free - POCT urinalysis dipstick - POCT UA - Microscopic Only  4. ASCVD (arteriosclerotic cardiovascular disease) -Patient will continue to see the cardiologist regularly - Hepatic function panel  5. BPH (benign prostatic hypertrophy) -The prostate is enlarged but soft and smooth and a PSA is being checked today - POCT urinalysis dipstick - POCT UA - Microscopic Only  6. Vitamin D deficiency -Patient should continue with vitamin D replacement pending results of lab work - Vit D  25 hydroxy (rtn osteoporosis monitoring)  7. Hydrocele in adult -He is having no issues with this at the present time  The patient's nitroglycerin will be refilled today.  Patient Instructions                       Medicare Annual Wellness Visit  [facility] and the medical providers at [facility] strive to bring you the best medical care.  In doing so we not only  want to address your current medical conditions and concerns but also to detect new conditions early and prevent illness, disease and health-related problems.    Medicare offers a yearly Wellness Visit which allows our clinical staff to assess your need for preventative services including immunizations, lifestyle education, counseling to decrease risk of preventable diseases and screening for fall risk and other medical concerns.    This visit is provided free of charge (no copay) for all Medicare recipients. The clinical pharmacists at [facility] have begun to conduct these Wellness Visits which will also include a thorough review of all your medications.    As you primary medical provider recommend that you make an appointment for your Annual Wellness Visit if you have not done so already this year.  You may set up this appointment before you leave today or you may call back ([phone]) and schedule an appointment.  Please make sure when you call that you mention that you are scheduling your Annual Wellness Visit with  the clinical pharmacist so that the appointment may be made for the proper length of time.     Continue current medications. Continue good therapeutic lifestyle changes which include good diet and exercise. Fall precautions discussed with patient. If an FOBT was given today- please return it to our front desk. If you are over 53 years old - you may need Prevnar 18 or the adult Pneumonia vaccine.  Flu Shots are still available at our office. If you still haven't had one please call to set up a nurse visit to get one.   After your visit with [place] today you will receive a survey in the mail or online from [employer] regarding your care with [place]. Please take a moment to fill this out. Your feedback is very important to [place] as you can help [place] better understand your patient needs as well as improve your experience and satisfaction. WE CARE ABOUT YOU!!!   Continue to  follow-up with cardiology Contact us immediately if you feel bad or you feel like they're having more shortness of breath or more symptoms that could be heart related, please do not hesitate. Continue to stay active physically and keep your weight down and watch sodium intake Return the FOBT We will call you when lab work is returned   Arrie Senate MD

## 2014-06-01 NOTE — Patient Instructions (Addendum)
Medicare Annual Wellness Visit  [facility] and the medical providers at [facility] strive to bring you the best medical care.  In doing so we not only want to address your current medical conditions and concerns but also to detect new conditions early and prevent illness, disease and health-related problems.    Medicare offers a yearly Wellness Visit which allows our clinical staff to assess your need for preventative services including immunizations, lifestyle education, counseling to decrease risk of preventable diseases and screening for fall risk and other medical concerns.    This visit is provided free of charge (no copay) for all Medicare recipients. The clinical pharmacists at [facility] have begun to conduct these Wellness Visits which will also include a thorough review of all your medications.    As you primary medical provider recommend that you make an appointment for your Annual Wellness Visit if you have not done so already this year.  You may set up this appointment before you leave today or you may call back ([phone]) and schedule an appointment.  Please make sure when you call that you mention that you are scheduling your Annual Wellness Visit with the clinical pharmacist so that the appointment may be made for the proper length of time.     Continue current medications. Continue good therapeutic lifestyle changes which include good diet and exercise. Fall precautions discussed with patient. If an FOBT was given today- please return it to our front desk. If you are over 6 years old - you may need Prevnar 63 or the adult Pneumonia vaccine.  Flu Shots are still available at our office. If you still haven't had one please call to set up a nurse visit to get one.   After your visit with [place] today you will receive a survey in the mail or online from [employer] regarding your care with [place]. Please take a moment to  fill this out. Your feedback is very important to [place] as you can help [place] better understand your patient needs as well as improve your experience and satisfaction. WE CARE ABOUT YOU!!!   Continue to follow-up with cardiology Contact us immediately if you feel bad or you feel like they're having more shortness of breath or more symptoms that could be heart related, please do not hesitate. Continue to stay active physically and keep your weight down and watch sodium intake Return the FOBT We will call you when lab work is returned

## 2014-06-02 LAB — THYROID PANEL WITH TSH
Free Thyroxine Index: 2 (ref 1.2–4.9)
T3 Uptake Ratio: 30 % (ref 24–39)
T4, Total: 6.6 ug/dL (ref 4.5–12.0)
TSH: 1.39 u[IU]/mL (ref 0.450–4.500)

## 2014-06-02 LAB — HEPATIC FUNCTION PANEL
ALT: 15 IU/L (ref 0–44)
AST: 16 IU/L (ref 0–40)
Albumin: 4 g/dL (ref 3.6–4.8)
Alkaline Phosphatase: 93 IU/L (ref 39–117)
BILIRUBIN TOTAL: 0.2 mg/dL (ref 0.0–1.2)
Bilirubin, Direct: 0.1 mg/dL (ref 0.00–0.40)
Total Protein: 6.6 g/dL (ref 6.0–8.5)

## 2014-06-02 LAB — PSA, TOTAL AND FREE
PSA FREE PCT: 13.7 %
PSA FREE: 0.41 ng/mL
Prostate Specific Ag, Serum: 3 ng/mL (ref 0.0–4.0)

## 2014-06-02 LAB — VITAMIN D 25 HYDROXY (VIT D DEFICIENCY, FRACTURES): Vit D, 25-Hydroxy: 27.9 ng/mL — ABNORMAL LOW (ref 30.0–100.0)

## 2014-06-05 ENCOUNTER — Telehealth: Payer: Self-pay | Admitting: Cardiology

## 2014-06-05 NOTE — Telephone Encounter (Signed)
Spoke with patient about recent lab results 

## 2014-06-05 NOTE — Telephone Encounter (Signed)
New message ° ° ° ° °Returning Anne's call °

## 2014-06-09 NOTE — Telephone Encounter (Signed)
-----   Message from Katrine Coho, RN sent at 06/05/2014 11:22 AM EDT ----- Referral to lipid clinic for PCSK9-inhibitor, Dr Aundra Dubin.  Thanks

## 2014-06-09 NOTE — Telephone Encounter (Signed)
Appt set for lipid clinic June 13

## 2014-06-12 ENCOUNTER — Other Ambulatory Visit: Payer: Self-pay | Admitting: Family Medicine

## 2014-06-26 ENCOUNTER — Ambulatory Visit (INDEPENDENT_AMBULATORY_CARE_PROVIDER_SITE_OTHER): Payer: Medicare PPO | Admitting: Pharmacist

## 2014-06-26 DIAGNOSIS — E785 Hyperlipidemia, unspecified: Secondary | ICD-10-CM

## 2014-06-26 NOTE — Progress Notes (Signed)
S/O: Dustin Bauer is a 69 yo male with CAD referred to pharmacy clinic by Dr. Aundra Dubin d/t h/o statin intolerance (myalgias) and fatigue while on Zetia.  Referral to assess eligibility for PCSK9 inhibitor for Medicare pt.  Pt hd his initial PCI to LAD in 2011 and in 7/15 an inferoposterior STEMI.  He has been treated with Lipitor, Crestor, and simvastatin in the past- all with muscle aches.  He states he was unable to get out of the bed some days while taking these.  Unfortunately we do not have any dosing information related to these agents.  He also failed Welchol due to fatigue and muscle aches.  He is currently taking Zetia 10mg  daily with no complaints.    Family History: Father with MI at 69yo, Uncles with MIs in their 64s, Grandfather with MI in his 64s.  Lipid Panel May 2016 (on Zetia alone) Total Chol: 153 Trig: 61 HDL: 47 LDL: 94  Lipid Panel December 2015 (no meds): Total Chol: 198 Trig: 87 HDL: 38 LDL: 142  Lipid Panel July 2015 (on Zetia alone): Total Chol: 131 Trig:74 HDL: 40 LDL: 76  LDL goal <70 mg/dL, non-HDL goal <100 mg/dL  Typical Daily Diet: Breakfast: PB&J with banana, coffee - rare bacon and eggs Lunch: chicken sub, sandwiches, soups Dinner: Lots of fish (salmon, sardines), some ground bison, lean meats, veggies, generally home cooked, nothing fried Snacks: fruit, desserts  Exercise: Walks or hikes x1hr/day Cardio and weights at fitness center 2-3x/wk  Past Medical History  Diagnosis Date  . Hyperlipidemia   . Hyperplasia of prostate   . Nicotine abuse   . ASCVD (arteriosclerotic cardiovascular disease)   . Presence of drug coated stent in LAD coronary artery   . Atrial fibrillation     s/p ablation by Dr Roxan Hockey  . Hydrocele, left   . Colon polyps   . Coronary artery disease   . Hypertension   . Hypothyroidism    Current Outpatient Prescriptions  Medication Sig Dispense Refill  . acetaminophen (TYLENOL) 325 MG tablet Take 325 mg by mouth at  bedtime as needed (pain/sleep).    Marland Kitchen aspirin EC 81 MG tablet Take 1 tablet (81 mg total) by mouth daily.    . cholecalciferol (VITAMIN D) 1000 UNITS tablet Take 1,000 Units by mouth daily. Pt states he does not take the every day (04/17/14)    . ezetimibe (ZETIA) 10 MG tablet Take 10 mg by mouth daily.    Marland Kitchen losartan (COZAAR) 25 MG tablet Take 1 tablet (25 mg total) by mouth daily. 90 tablet 3  . MELATONIN PO Take 2 mg by mouth at bedtime.    . nitroGLYCERIN (NITROSTAT) 0.4 MG SL tablet Place 1 tablet (0.4 mg total) under the tongue every 5 (five) minutes as needed for chest pain. 25 tablet 11  . prasugrel (EFFIENT) 10 MG TABS tablet Take 1 tablet (10 mg total) by mouth daily.    Marland Kitchen Resveratrol 250 MG CAPS Take 250 mg by mouth daily.    Marland Kitchen ZETIA 10 MG tablet TAKE 1 TABLETS ONCE A DAY 30 tablet 5   No current facility-administered medications for this visit.   Allergies  Allergen Reactions  . Crestor [Rosuvastatin Calcium] Other (See Comments)    Extreme muscular weakness  . Lipitor [Atorvastatin Calcium] Other (See Comments)    Extreme muscular weakness   . Statins Other (See Comments)    Extreme muscular weakness      A/P: Pt's LDL has improved greatly with Zetia  10mg  daily but still above goal.  With pt's history of statin intolerance, no other oral medication choices available.  We discussed PCSK-9 inhibitors, including the potential cost associated with therapy.  He would like to pursue this option.  Will complete paperwork to begin benefits investigation.  Once approved, will recheck labs 6-8 weeks after starting therapy.  Pt is agreeable.

## 2014-07-11 ENCOUNTER — Telehealth: Payer: Self-pay | Admitting: Pharmacist

## 2014-07-11 NOTE — Telephone Encounter (Signed)
If he cut the dose in half with no improvement, would stay off.

## 2014-07-11 NOTE — Telephone Encounter (Signed)
Pt called to follow up with Praluent.  We are still waiting on approval.    He also mentioned he had issues with losartan.  The coughing improved with this but he continued to have dizziness. He cut the dose in half with no improvement in the dizziness.  He stopped about a week ago and is feeling much better.  Will forward note to Dr. Aundra Dubin to see if he would like to adjust therapy further.

## 2014-07-12 NOTE — Telephone Encounter (Signed)
Pt advised,verbalized understanding. 

## 2014-07-20 ENCOUNTER — Other Ambulatory Visit: Payer: Self-pay | Admitting: Pharmacist

## 2014-07-20 MED ORDER — ALIROCUMAB 75 MG/ML ~~LOC~~ SOPN: 75.0000 mg | PEN_INJECTOR | SUBCUTANEOUS | Status: DC

## 2014-07-31 ENCOUNTER — Telehealth: Payer: Self-pay | Admitting: Pharmacist

## 2014-07-31 ENCOUNTER — Telehealth: Payer: Self-pay | Admitting: Cardiology

## 2014-07-31 DIAGNOSIS — E785 Hyperlipidemia, unspecified: Secondary | ICD-10-CM

## 2014-07-31 NOTE — Telephone Encounter (Signed)
Returned patient's call to answer medication-related questions. Patient injected first dose of Praluent today - no issues with injection but was concerned about side effect of myalgias. Discussed that incidence is much lower than with statins but encouraged patient to let us know if he has any myalgias with Praluent. He asked whether he should continue his Zetia since starting the Praluent. Advised him to continue until next lipid panel - given patient's LDL in 90s and anticipating 60+% reduction in LDL with Praluent, will likely be able to discontinue Zetia at that time.

## 2014-07-31 NOTE — Telephone Encounter (Signed)
New message     Pt requesting to speak with Elberta Leatherwood.   Pt needs instructions on how to take praluent shot. Please call to discuss.

## 2014-08-02 NOTE — Telephone Encounter (Signed)
Spoke with pt.  He was able to complete his injection on Monday.  Will need to have him recheck his lipid and liver panel in approximately 2 months.  Pt would like to have this done at Dr. Tawanna Sat office since he lives in [place].  Will put orders into system and send note to Dr. Laurance Flatten to see if we can get the labs scheduled there for him.

## 2014-08-02 NOTE — Telephone Encounter (Signed)
Please schedule these labs and office and call patient and let him know the can get them here

## 2014-08-03 NOTE — Telephone Encounter (Signed)
Pt called and aware that he can come by in 2 mos.

## 2014-09-13 ENCOUNTER — Encounter: Payer: Self-pay | Admitting: *Deleted

## 2014-09-20 ENCOUNTER — Other Ambulatory Visit (INDEPENDENT_AMBULATORY_CARE_PROVIDER_SITE_OTHER): Payer: Medicare PPO

## 2014-09-20 DIAGNOSIS — E785 Hyperlipidemia, unspecified: Secondary | ICD-10-CM | POA: Diagnosis not present

## 2014-09-20 NOTE — Progress Notes (Signed)
Lab only 

## 2014-09-21 LAB — LIPID PANEL
CHOL/HDL RATIO: 1.9 ratio (ref 0.0–5.0)
Cholesterol, Total: 111 mg/dL (ref 100–199)
HDL: 60 mg/dL (ref >39)
LDL Calculated: 39 mg/dL (ref 0–99)
Triglycerides: 58 mg/dL (ref 0–149)
VLDL CHOLESTEROL CAL: 12 mg/dL (ref 5–40)

## 2014-09-21 LAB — HEPATIC FUNCTION PANEL
ALBUMIN: 4.3 g/dL (ref 3.6–4.8)
ALT: 15 IU/L (ref 0–44)
AST: 16 IU/L (ref 0–40)
Alkaline Phosphatase: 84 IU/L (ref 39–117)
BILIRUBIN, DIRECT: 0.2 mg/dL (ref 0.00–0.40)
Bilirubin Total: 0.6 mg/dL (ref 0.0–1.2)
Total Protein: 6.6 g/dL (ref 6.0–8.5)

## 2014-09-22 ENCOUNTER — Telehealth: Payer: Self-pay | Admitting: Cardiology

## 2014-09-22 NOTE — Telephone Encounter (Signed)
Spoke with patient about lab results done 09/20/14

## 2014-09-22 NOTE — Telephone Encounter (Signed)
New message  ° ° ° °Pt returning call about lab results  °

## 2014-10-11 ENCOUNTER — Encounter: Payer: Self-pay | Admitting: *Deleted

## 2014-10-19 ENCOUNTER — Telehealth: Payer: Self-pay | Admitting: Internal Medicine

## 2014-10-19 NOTE — Telephone Encounter (Signed)
Spoke w/ pt wife and informed her that pt needed to send manual transmission using home monitor and that would get pt back on the right schedule. She verbalized understanding.

## 2014-10-19 NOTE — Telephone Encounter (Signed)
New Message  Pt calling to speak w/ Device concerning letter about a missed remote check. Pt was not aware of remote check. Please call back and discuss.l

## 2014-10-25 ENCOUNTER — Telehealth: Payer: Self-pay | Admitting: Cardiology

## 2014-10-25 NOTE — Telephone Encounter (Signed)
Spoke w/ pt and he is aware that his implanted device has reached EOS and that a scheduler will call him to schedule an appt w/ MD to talk about next steps.

## 2014-11-10 ENCOUNTER — Other Ambulatory Visit: Payer: Self-pay | Admitting: *Deleted

## 2014-11-10 MED ORDER — ALIROCUMAB 75 MG/ML ~~LOC~~ SOPN: 75.0000 mg | PEN_INJECTOR | SUBCUTANEOUS | Status: DC

## 2014-11-10 NOTE — Telephone Encounter (Signed)
Caren Griffins @ [facility] requested the pt's praluent, sent to [facility]

## 2014-11-14 ENCOUNTER — Other Ambulatory Visit: Payer: Self-pay | Admitting: Pharmacist

## 2014-11-14 MED ORDER — ALIROCUMAB 75 MG/ML ~~LOC~~ SOPN: 75.0000 mg | PEN_INJECTOR | SUBCUTANEOUS | Status: DC

## 2014-11-27 ENCOUNTER — Ambulatory Visit (INDEPENDENT_AMBULATORY_CARE_PROVIDER_SITE_OTHER): Payer: Medicare PPO | Admitting: Internal Medicine

## 2014-11-27 ENCOUNTER — Encounter: Payer: Self-pay | Admitting: Internal Medicine

## 2014-11-27 VITALS — BP 140/78 | HR 51 | Ht 70.0 in | Wt 169.0 lb

## 2014-11-27 DIAGNOSIS — I480 Paroxysmal atrial fibrillation: Secondary | ICD-10-CM

## 2014-11-27 DIAGNOSIS — I2510 Atherosclerotic heart disease of native coronary artery without angina pectoris: Secondary | ICD-10-CM

## 2014-11-27 LAB — CUP PACEART INCLINIC DEVICE CHECK
Date Time Interrogation Session: 20161114143449
Pulse Gen Model: 9529

## 2014-11-27 NOTE — Patient Instructions (Signed)
Medication Instructions:  Your physician recommends that you continue on your current medications as directed. Please refer to the Current Medication list given to you today.   Labwork: None ordered   Testing/Procedures: Removal of ILR recorder   Follow-Up:  Will schedule when he call back to schedule ILR removal  Any Other Special Instructions Will Be Listed Below (If Applicable).     If you need a refill on your cardiac medications before your next appointment, please call your pharmacy.

## 2014-11-27 NOTE — Progress Notes (Signed)
Electrophysiology Office Note   Date:  11/27/2014   ID:  Dustin Bauer, DOB April 15, 1945, MRN [id number]  PCP:  Redge Gainer, MD  Cardiologist:  Dr Aundra Dubin Primary Electrophysiologist: [name], MD    Chief Complaint  Patient presents with  . PAF     History of Present Illness: Dustin Bauer is a 69 y.o. male who presents today for electrophysiology follow-up.   He has a h/o afib s/p ablation at [facility] by Dr Roxan Hockey in 2012.  He has had no symptoms of afib since that time.  He also had CAD.  He had PCI to LAD in 2011 for stable angina.  In 7/15, he passed out while in the gym.  He was found to have complete heart block in the setting of an inferoposterior STEMI.  He had temporary transvenous pacemaker placed and had Xience DES to totally occluded RCA.  EF was 50-55% by LV-gram.  After PCI, heart block resolved. Patient has been doing well since his MI.  He walks 4-5 miles at a time and works out at a gym.  No chest pain, no lightheadedness or syncope.  Mild dyspnea walking up hills.  No orthopnea or PND.  Recently his effient was stopped and he was placed on xarelto by Dr Aundra Dubin.  He is tolerating this well.  The patient is tolerating medications without difficulties and is otherwise without complaint today.    Past Medical History  Diagnosis Date  . Hyperlipidemia   . Hyperplasia of prostate   . Nicotine abuse   . ASCVD (arteriosclerotic cardiovascular disease)   . Presence of drug coated stent in LAD coronary artery   . Atrial fibrillation [facility])     s/p ablation by Dr Roxan Hockey  . Hydrocele, left   . Colon polyps   . Coronary artery disease   . Hypertension   . Hypothyroidism    Past Surgical History  Procedure Laterality Date  . Tonsillectomy and adenoidectomy    . Cardiac catheterization    . Coronary stent placement    . Left heart cath N/A 07/15/2013    Procedure: LEFT HEART CATH;  Surgeon: Clent Demark, MD;  Location: [facility] CATH LAB;  Service: Cardiovascular;   Laterality: N/A;  . Atrial fibrillation ablation  07/2010    Afib ablation by Dr Roxan Hockey at [facility] in 2012  . Implantable loop recorder implantation  08/13/2010    MDT Reveal XT implanted by Dr Roxan Hockey for afib management post ablation     Current Outpatient Prescriptions  Medication Sig Dispense Refill  . acetaminophen (TYLENOL) 325 MG tablet Take 325 mg by mouth at bedtime as needed (pain/sleep).    . Alirocumab (PRALUENT) 75 MG/ML SOPN Inject 75 mg into the skin every 14 (fourteen) days. 2 pen 3  . aspirin EC 81 MG tablet Take 1 tablet (81 mg total) by mouth daily.    . cholecalciferol (VITAMIN D) 1000 UNITS tablet Take 1,000 Units by mouth daily. Pt states he does not take the every day (04/17/14)    . ezetimibe (ZETIA) 10 MG tablet Take 10 mg by mouth daily.    Marland Kitchen MELATONIN PO Take 2 mg by mouth at bedtime.    . nitroGLYCERIN (NITROSTAT) 0.4 MG SL tablet Place 1 tablet (0.4 mg total) under the tongue every 5 (five) minutes as needed for chest pain. 25 tablet 11  . prasugrel (EFFIENT) 10 MG TABS tablet Take 1 tablet (10 mg total) by mouth daily.    Marland Kitchen Resveratrol  250 MG CAPS Take 250 mg by mouth daily.    Alveda Reasons 20 MG TABS tablet Take 1 tablet by mouth daily with supper.  1   No current facility-administered medications for this visit.    Allergies:   Crestor; Lipitor; and Statins   Social History:  The patient  reports that he quit smoking about 11 years ago. His smoking use included Cigarettes. He smoked 0.25 packs per day. He does not have any smokeless tobacco history on file. He reports that he drinks about 0.6 oz of alcohol per week. He reports that he does not use illicit drugs.   Family History:  The patient's family history includes Dementia in his mother; Heart disease in his father; Hyperlipidemia in his father.    ROS:  Please see the history of present illness.   All other systems are reviewed and negative.    PHYSICAL EXAM: VS:  BP 140/78 mmHg  Pulse 51  Ht  5\' 10"  (1.778 m)  Wt 169 lb (76.658 kg)  BMI 24.25 kg/m2 , BMI Body mass index is 24.25 kg/(m^2). GEN: Well nourished, well developed, in no acute distress HEENT: normal Neck: no JVD, carotid bruits, or masses Cardiac: RRR; no murmurs, rubs, or gallops,no edema  Respiratory:  clear to auscultation bilaterally, normal work of breathing GI: soft, nontender, nondistended, + BS MS: no deformity or atrophy Skin: warm and dry, device pocket is well healed Neuro:  Strength and sensation are intact Psych: euthymic mood, full affect  EKG:  EKG 12/15 is reviwed and reveals sinus bradycardia 51 bpm with possible sinus exit block.  First degree AV block is also reviewed  Device interrogation is reviewed End of life on the device   Recent Labs: 06/01/2014: BUN 18; Creatinine, Ser 0.96; Hemoglobin 14.6; Platelets 203.0; Potassium 4.6; Sodium 139; TSH 1.390 09/20/2014: ALT 15    Lipid Panel     Component Value Date/Time   CHOL 111 09/20/2014 0930   CHOL 153 06/01/2014 1030   TRIG 58 09/20/2014 0930   HDL 60 09/20/2014 0930   HDL 47.10 06/01/2014 1030   CHOLHDL 1.9 09/20/2014 0930   CHOLHDL 3 06/01/2014 1030   VLDL 12.2 06/01/2014 1030   LDLCALC 39 09/20/2014 0930   LDLCALC 94 06/01/2014 1030     Wt Readings from Last 3 Encounters:  11/27/14 169 lb (76.658 kg)  06/01/14 169 lb (76.658 kg)  06/01/14 170 lb (77.111 kg)      Other studies Reviewed: Additional studies/ records that were reviewed today include: Dr Oleh Genin notes   ASSESSMENT AND PLAN:  1.  Paroxysmal atrial fibrillation Doing well s/p ablation without symptoms of afib ILR last visit revealed low afib burden though he does have occasional episodes chasd2vasc score is at least 3.  He is now off of effient and on xarelto.  We defer decision about stopping ASA to Dr Aundra Dubin.  His ILR battery has quit working completely.  I discussed implantable loop recorder removal with the patient today.  Risks of this procedure were  discussed at length.  He is clear that he would like to have the device removed.  He wants to wait until after the first of the year  2. HTN Stable No change required today  3. CAD No ischemic symptoms No changes today  Current medicines are reviewed at length with the patient today.   The patient does not have concerns regarding his medicines.  The following changes were made today:  none  Follow-up: with Dr  Aundra Dubin as scheduled   Signed, [name], MD  11/27/2014 3:11 PM     [place] [address] [phone] (office) [phone] (fax)

## 2014-12-04 ENCOUNTER — Encounter: Payer: Self-pay | Admitting: Family Medicine

## 2014-12-04 ENCOUNTER — Encounter (INDEPENDENT_AMBULATORY_CARE_PROVIDER_SITE_OTHER): Payer: Self-pay

## 2014-12-04 ENCOUNTER — Ambulatory Visit (INDEPENDENT_AMBULATORY_CARE_PROVIDER_SITE_OTHER): Payer: Medicare PPO | Admitting: Family Medicine

## 2014-12-04 VITALS — BP 137/87 | HR 46 | Temp 97.1°F | Ht 70.0 in | Wt 165.0 lb

## 2014-12-04 DIAGNOSIS — I2119 ST elevation (STEMI) myocardial infarction involving other coronary artery of inferior wall: Secondary | ICD-10-CM

## 2014-12-04 DIAGNOSIS — L570 Actinic keratosis: Secondary | ICD-10-CM

## 2014-12-04 DIAGNOSIS — E785 Hyperlipidemia, unspecified: Secondary | ICD-10-CM | POA: Diagnosis not present

## 2014-12-04 DIAGNOSIS — I2510 Atherosclerotic heart disease of native coronary artery without angina pectoris: Secondary | ICD-10-CM

## 2014-12-04 DIAGNOSIS — N400 Enlarged prostate without lower urinary tract symptoms: Secondary | ICD-10-CM | POA: Diagnosis not present

## 2014-12-04 DIAGNOSIS — I10 Essential (primary) hypertension: Secondary | ICD-10-CM

## 2014-12-04 DIAGNOSIS — I480 Paroxysmal atrial fibrillation: Secondary | ICD-10-CM | POA: Diagnosis not present

## 2014-12-04 DIAGNOSIS — Z23 Encounter for immunization: Secondary | ICD-10-CM | POA: Diagnosis not present

## 2014-12-04 DIAGNOSIS — E559 Vitamin D deficiency, unspecified: Secondary | ICD-10-CM

## 2014-12-04 MED ORDER — KETOCONAZOLE 2 % EX CREA: 1.0000 "application " | TOPICAL_CREAM | Freq: Every day | CUTANEOUS | Status: DC | PRN

## 2014-12-04 NOTE — Patient Instructions (Addendum)
Medicare Annual Wellness Visit  [facility] and the medical providers at [facility] strive to bring you the best medical care.  In doing so we not only want to address your current medical conditions and concerns but also to detect new conditions early and prevent illness, disease and health-related problems.    Medicare offers a yearly Wellness Visit which allows our clinical staff to assess your need for preventative services including immunizations, lifestyle education, counseling to decrease risk of preventable diseases and screening for fall risk and other medical concerns.    This visit is provided free of charge (no copay) for all Medicare recipients. The clinical pharmacists at [facility] have begun to conduct these Wellness Visits which will also include a thorough review of all your medications.    As you primary medical provider recommend that you make an appointment for your Annual Wellness Visit if you have not done so already this year.  You may set up this appointment before you leave today or you may call back [phone] and schedule an appointment.  Please make sure when you call that you mention that you are scheduling your Annual Wellness Visit with the clinical pharmacist so that the appointment may be made for the proper length of time.     Continue current medications. Continue good therapeutic lifestyle changes which include good diet and exercise. Fall precautions discussed with patient. If an FOBT was given today- please return it to our front desk. If you are over 69 years old - you may need Prevnar 22 or the adult Pneumonia vaccine.  **Flu shots are available--- please call and schedule a FLU-CLINIC appointment**  After your visit with [place] today you will receive a survey in the mail or online from [employer] regarding your care with [place]. Please take a moment to fill this out. Your feedback is very  important to [place] as you can help [place] better understand your patient needs as well as improve your experience and satisfaction. WE CARE ABOUT YOU!!!   Follow-up with cardiology as planned Continue to follow-up aggressive therapeutic lifestyle changes Get your eye exam in the spring as planned We will refill the cream that you're requesting to be refilled at [facility] Return the FOBT Coming get your blood work as planned and we will make sure the cardiologist is able to see this  COME BETWEEN 12/8 AND 12/19 - FOR FASTING LABS.

## 2014-12-04 NOTE — Progress Notes (Signed)
Subjective:    Patient ID: Dustin Bauer, male    DOB: 1945/12/12, 69 y.o.   MRN: [id number]  HPI Pt here for follow up and management of chronic medical problems which includes hypertension and hyperlipidemia. He is taking medications regularly. The patient is being followed by the cardiologist regularly and recently saw Dr. Rayann Heman has a planned follow-up with Dr. Aundra Dubin. He is now injecting praulent twice monthly.       Patient Active Problem List   Diagnosis Date Noted  . Hypertensive cardiovascular disease 04/17/2014  . CAD (coronary artery disease) 12/16/2013  . Acute MI, inferoposterior wall ([employer]) 07/15/2013  . Abnormal chest CT 05/30/2013  . Elevated PSA 05/30/2013  . ASCVD (arteriosclerotic cardiovascular disease) 06/24/2012  . Hyperlipidemia 06/24/2012  . Paroxysmal atrial fibrillation ([place]) 06/24/2012  . Statin intolerance 06/24/2012   Outpatient Encounter Prescriptions as of 12/04/2014  Medication Sig  . acetaminophen (TYLENOL) 325 MG tablet Take 325 mg by mouth at bedtime as needed (pain/sleep).  . Alirocumab (PRALUENT) 75 MG/ML SOPN Inject 75 mg into the skin every 14 (fourteen) days.  Marland Kitchen aspirin EC 81 MG tablet Take 1 tablet (81 mg total) by mouth daily.  . cholecalciferol (VITAMIN D) 1000 UNITS tablet Take 1,000 Units by mouth daily. Pt states he does not take the every day (04/17/14)  . MELATONIN PO Take 2 mg by mouth at bedtime.  . nitroGLYCERIN (NITROSTAT) 0.4 MG SL tablet Place 1 tablet (0.4 mg total) under the tongue every 5 (five) minutes as needed for chest pain.  Marland Kitchen Resveratrol 250 MG CAPS Take 250 mg by mouth daily.  Alveda Reasons 20 MG TABS tablet Take 1 tablet by mouth daily with supper.  . [DISCONTINUED] ezetimibe (ZETIA) 10 MG tablet Take 10 mg by mouth daily.  Marland Kitchen ketoconazole (NIZORAL) 2 % cream Apply 1 application topically daily as needed for irritation.  . [DISCONTINUED] prasugrel (EFFIENT) 10 MG TABS tablet Take 1 tablet (10 mg total) by mouth daily.   No  facility-administered encounter medications on file as of 12/04/2014.     Review of Systems  Constitutional: Negative.   HENT: Negative.   Eyes: Negative.   Respiratory: Negative.   Cardiovascular: Negative.   Gastrointestinal: Negative.   Endocrine: Negative.   Genitourinary: Negative.   Musculoskeletal: Negative.   Skin: Negative.   Allergic/Immunologic: Negative.   Neurological: Negative.   Hematological: Negative.   Psychiatric/Behavioral: Negative.        Objective:   Physical Exam  Constitutional: He is oriented to person, place, and time. He appears well-developed and well-nourished. No distress.  HENT:  Head: Normocephalic and atraumatic.  Right Ear: External ear normal.  Left Ear: External ear normal.  Mouth/Throat: Oropharynx is clear and moist. No oropharyngeal exudate.  Nasal congestion and turbinate swelling bilaterally  Eyes: Conjunctivae and EOM are normal. Pupils are equal, round, and reactive to light. Right eye exhibits no discharge. Left eye exhibits no discharge. No scleral icterus.  Neck: Normal range of motion. Neck supple. No thyromegaly present.  No bruits anterior cervical adenopathy or thyromegaly  Cardiovascular: Regular rhythm, normal heart sounds and intact distal pulses.   No murmur heard. Heart has a regular rate and rhythm at 48/m  Pulmonary/Chest: Effort normal and breath sounds normal. No respiratory distress. He has no wheezes. He has no rales. He exhibits no tenderness.  Clear anteriorly and posteriorly  Abdominal: Soft. Bowel sounds are normal. He exhibits no mass. There is no tenderness. There is no rebound and no  guarding.  No abdominal tenderness or organ enlargement or masses  Musculoskeletal: Normal range of motion. He exhibits no edema.  Lymphadenopathy:    He has no cervical adenopathy.  Neurological: He is alert and oriented to person, place, and time.  Skin: Skin is warm and dry. No rash noted.  There are a couple of actinic  keratoses one on the for head and one on the left temple and cryotherapy was performed without problems  Psychiatric: He has a normal mood and affect. His behavior is normal. Judgment and thought content normal.  Nursing note and vitals reviewed.  BP 137/87 mmHg  Pulse 46  Temp(Src) 97.1 F (36.2 C) (Oral)  Ht 5' 10" (1.778 m)  Wt 165 lb (74.844 kg)  BMI 23.68 kg/m2        Assessment & Plan:  1. Hyperlipidemia -Return to clinic for lab work as planned in December - CBC with Differential/Platelet; Future - NMR, lipoprofile; Future  2. Essential hypertension -The blood pressure is good today  - BMP8+EGFR; Future - CBC with Differential/Platelet; Future - Hepatic function panel; Future  3. BPH (benign prostatic hypertrophy) -Patient has no symptoms as he is water - CBC with Differential/Platelet; Future  4. ASCVD (arteriosclerotic cardiovascular disease) -He has a regular follow-up with the cardiologist in December - CBC with Differential/Platelet; Future - NMR, lipoprofile; Future  5. Vitamin D deficiency -Drink any current treatment pending results of lab work - CBC with Differential/Platelet; Future - VITAMIN D 25 Hydroxy (Vit-D Deficiency, Fractures); Future  6. Acute MI, inferoposterior wall ([place]) -Up regularly with cardiology  7. Paroxysmal atrial fibrillation (HCC) -Follow up regularly with cardiology  8. Actinic keratosis -Cryotherapy to 2 areas on the face one on the forehead and one on the left temple area  Meds ordered this encounter  Medications  . ketoconazole (NIZORAL) 2 % cream    Sig: Apply 1 application topically daily as needed for irritation.    Dispense:  15 g    Refill:  1   Patient Instructions                       Medicare Annual Wellness Visit  [facility] and the medical providers at [facility] strive to bring you the best medical care.  In doing so we not only want to address your current medical conditions  and concerns but also to detect new conditions early and prevent illness, disease and health-related problems.    Medicare offers a yearly Wellness Visit which allows our clinical staff to assess your need for preventative services including immunizations, lifestyle education, counseling to decrease risk of preventable diseases and screening for fall risk and other medical concerns.    This visit is provided free of charge (no copay) for all Medicare recipients. The clinical pharmacists at [facility] have begun to conduct these Wellness Visits which will also include a thorough review of all your medications.    As you primary medical provider recommend that you make an appointment for your Annual Wellness Visit if you have not done so already this year.  You may set up this appointment before you leave today or you may call back [phone]) and schedule an appointment.  Please make sure when you call that you mention that you are scheduling your Annual Wellness Visit with the clinical pharmacist so that the appointment may be made for the proper length of time.     Continue current medications. Continue  good therapeutic lifestyle changes which include good diet and exercise. Fall precautions discussed with patient. If an FOBT was given today- please return it to our front desk. If you are over 50 years old - you may need Prevnar 13 or the adult Pneumonia vaccine.  **Flu shots are available--- please call and schedule a FLU-CLINIC appointment**  After your visit with us today you will receive a survey in the mail or online from [employer] regarding your care with us. Please take a moment to fill this out. Your feedback is very important to us as you can help us better understand your patient needs as well as improve your experience and satisfaction. WE CARE ABOUT YOU!!!   Follow-up with cardiology as planned Continue to follow-up aggressive therapeutic lifestyle  changes Get your eye exam in the spring as planned We will refill the cream that you're requesting to be refilled at [facility] pharmacy Return the FOBT Coming get your blood work as planned and we will make sure the cardiologist is able to see this   Don W. Moore MD     

## 2014-12-09 ENCOUNTER — Other Ambulatory Visit: Payer: Self-pay | Admitting: Family Medicine

## 2014-12-11 ENCOUNTER — Other Ambulatory Visit: Payer: Self-pay | Admitting: Family Medicine

## 2014-12-28 ENCOUNTER — Encounter: Payer: Self-pay | Admitting: Cardiology

## 2014-12-28 ENCOUNTER — Ambulatory Visit (INDEPENDENT_AMBULATORY_CARE_PROVIDER_SITE_OTHER): Payer: Medicare PPO | Admitting: Cardiology

## 2014-12-28 VITALS — BP 132/80 | HR 42 | Ht 70.0 in | Wt 164.0 lb

## 2014-12-28 DIAGNOSIS — I480 Paroxysmal atrial fibrillation: Secondary | ICD-10-CM | POA: Diagnosis not present

## 2014-12-28 DIAGNOSIS — E785 Hyperlipidemia, unspecified: Secondary | ICD-10-CM

## 2014-12-28 DIAGNOSIS — I2510 Atherosclerotic heart disease of native coronary artery without angina pectoris: Secondary | ICD-10-CM

## 2014-12-28 DIAGNOSIS — I10 Essential (primary) hypertension: Secondary | ICD-10-CM | POA: Diagnosis not present

## 2014-12-28 DIAGNOSIS — I4891 Unspecified atrial fibrillation: Secondary | ICD-10-CM | POA: Diagnosis not present

## 2014-12-28 DIAGNOSIS — E559 Vitamin D deficiency, unspecified: Secondary | ICD-10-CM

## 2014-12-28 LAB — CBC WITH DIFFERENTIAL/PLATELET
BASOS PCT: 0 % (ref 0–1)
Basophils Absolute: 0 10*3/uL (ref 0.0–0.1)
Eosinophils Absolute: 0.3 10*3/uL (ref 0.0–0.7)
Eosinophils Relative: 4 % (ref 0–5)
HCT: 40.3 % (ref 39.0–52.0)
HEMOGLOBIN: 13.7 g/dL (ref 13.0–17.0)
Lymphocytes Relative: 34 % (ref 12–46)
Lymphs Abs: 2.6 10*3/uL (ref 0.7–4.0)
MCH: 31.8 pg (ref 26.0–34.0)
MCHC: 34 g/dL (ref 30.0–36.0)
MCV: 93.5 fL (ref 78.0–100.0)
MONO ABS: 0.6 10*3/uL (ref 0.1–1.0)
MPV: 10.3 fL (ref 8.6–12.4)
Monocytes Relative: 8 % (ref 3–12)
NEUTROS ABS: 4.1 10*3/uL (ref 1.7–7.7)
Neutrophils Relative %: 54 % (ref 43–77)
Platelets: 194 10*3/uL (ref 150–400)
RBC: 4.31 MIL/uL (ref 4.22–5.81)
RDW: 14.9 % (ref 11.5–15.5)
WBC: 7.6 10*3/uL (ref 4.0–10.5)

## 2014-12-28 NOTE — Patient Instructions (Signed)
Medication Instructions:   Stop aspirin.  Labwork: Cardio IQ (new name for [employer]) /CBCd/BMET/Hepatic profile/VItamin D  Testing/Procedures: None today  Follow-Up: Your physician wants you to follow-up in: 6 months with Dr Aundra Dubin. (June 2017).  You will receive a reminder letter in the mail two months in advance. If you don't receive a letter, please call our office to schedule the follow-up appointment.        If you need a refill on your cardiac medications before your next appointment, please call your pharmacy.

## 2014-12-29 LAB — HEPATIC FUNCTION PANEL
ALT: 14 U/L (ref 9–46)
AST: 21 U/L (ref 10–35)
Albumin: 3.8 g/dL (ref 3.6–5.1)
Alkaline Phosphatase: 58 U/L (ref 40–115)
BILIRUBIN DIRECT: 0.1 mg/dL (ref <=0.2)
Indirect Bilirubin: 0.3 mg/dL (ref 0.2–1.2)
Total Bilirubin: 0.4 mg/dL (ref 0.2–1.2)
Total Protein: 6.1 g/dL (ref 6.1–8.1)

## 2014-12-29 LAB — BASIC METABOLIC PANEL
BUN: 20 mg/dL (ref 7–25)
CHLORIDE: 106 mmol/L (ref 98–110)
CO2: 23 mmol/L (ref 20–31)
CREATININE: 0.96 mg/dL (ref 0.70–1.25)
Calcium: 9 mg/dL (ref 8.6–10.3)
GLUCOSE: 79 mg/dL (ref 65–99)
Potassium: 4.5 mmol/L (ref 3.5–5.3)
Sodium: 139 mmol/L (ref 135–146)

## 2014-12-29 LAB — VITAMIN D 25 HYDROXY (VIT D DEFICIENCY, FRACTURES): VIT D 25 HYDROXY: 29 ng/mL — AB (ref 30–100)

## 2014-12-30 NOTE — Progress Notes (Signed)
Patient ID: Dustin Bauer, male   DOB: [birth year], 69 y.o.   MRN: [id number] PCP: Dr. Laurance Flatten  69 yo with history of CAD and paroxysmal atrial fibrillation presents for cardiology evaluation.  He had initial PCI to LAD in 2011 for stable angina.  He developed paroxysmal atrial fibrillation and had ablation at [facility] several years ago. He was initially anticoagulated but has been off anticoagulation for several years.  He has a Medtronic Reveal ILR.  In 7/15, he passed out while in the gym.  He was found to have complete heart block in the setting of an inferoposterior STEMI.  He had temporary transvenous pacemaker placed and had Xience DES to totally occluded RCA.  EF was 50-55% by LV-gram.  After PCI, heart block resolved.   Patient is doing well.  No tachypalpitations.  He stopped losartan due to fatigue.  He is now on Praluent and lipids are well-controlled.  No chest pain, no significant exertional dyspnea . He walks for 1 hr/day and goes to the gym 2-3 times/week.                                                                                                                                    Labs (7/15): K 4.1, creatinine 0.95 Labs (12/15): K 4.2, creatinine 1.0, LDL 142, HDL 38 Labs (5/16): K 4.6, creatinine 0.96 Labs (9/16): LDL 39, HDL 60  ECG: NSR, 1st degree AV block, inferior T wave flattening  PMH: 1. CAD: He had LHC in 2011 for chronic stable angina, at that time had PCI with DES to proximal LAD [facility]).  In 7/15, he presented with syncope and inferoposterior STEMI.  He was found to have complete heart block with junctional escape.  LHC showed 60-70% OM1 stenosis, 30-40% proximal LAD, total occlusion of the mid to distal LAD with EF 50-55% by LV-gram.  He had temporary transvenous PCM placement and Xience DES to the RCA.  Echo (12/15) with EF 60-65%, mild LVH, mildly dilated RV with normal systolic function.  2. Hyperlipidemia: Myalgias with Zocor, Crestor, atorvastatin.   3.  Atrial fibrillation: s/p ablation at [facility] several years ago.  He was on coumadin then Xarelto.  Anticoagulation was stopped about 2 years ago.  He has a Medtronic Reveal monitor in place that has shown recurrent atrial fibrillation.  4. Colon polyps 5. Cough and fatigue with ACEI  SH: Married, retired, lives in [place], 2 children. Quit smoking in 2002.   FH: Father with MI at 82, uncles with MIs in their 76s.  Grandfather with MI in his 5s.   ROS: All systems reviewed and negative except as per HPI.   Current Outpatient Prescriptions  Medication Sig Dispense Refill  . acetaminophen (TYLENOL) 325 MG tablet Take 325 mg by mouth at bedtime as needed (pain/sleep).    . Alirocumab (PRALUENT) 75 MG/ML SOPN Inject 75 mg into the skin every 14 (fourteen)  days. 2 pen 3  . cholecalciferol (VITAMIN D) 1000 UNITS tablet Take 1,000 Units by mouth daily. Pt states he does not take the every day (04/17/14)    . ketoconazole (NIZORAL) 2 % cream Apply 1 application topically daily as needed for irritation. 15 g 1  . MELATONIN PO Take 2 mg by mouth at bedtime.    . nitroGLYCERIN (NITROSTAT) 0.4 MG SL tablet Place 1 tablet (0.4 mg total) under the tongue every 5 (five) minutes as needed for chest pain. 25 tablet 11  . Resveratrol 250 MG CAPS Take 250 mg by mouth daily.    Alveda Reasons 20 MG TABS tablet Take 1 tablet by mouth daily with supper.  1  . ZETIA 10 MG tablet TAKE 1 TABLETS ONCE A DAY 30 tablet 2  . ZETIA 10 MG tablet TAKE 1 TABLETS ONCE A DAY 30 tablet 5   No current facility-administered medications for this visit.   BP 132/80 mmHg  Pulse 42  Ht 5\' 10"  (1.778 m)  Wt 164 lb (74.39 kg)  BMI 23.53 kg/m2 General: NAD Neck: No JVD, no thyromegaly or thyroid nodule.  Lungs: Clear to auscultation bilaterally with normal respiratory effort. CV: Nondisplaced PMI.  Heart regular S1/S2, no S3/S4, no murmur.  No peripheral edema.  No carotid bruit.  Normal pedal pulses.  Abdomen: Soft, nontender,  no hepatosplenomegaly, no distention.  Skin: Intact without lesions or rashes.  Neurologic: Alert and oriented x 3.  Psych: Normal affect. Extremities: No clubbing or cyanosis.  HEENT: Normal.   Assessment/Plan: 1. CAD: Stable s/p inferoposterior MI in 7/15.  EF 50-55% by LV-gram.  Echo in 12/15 with EF 60-65%.  No chest pain or significant exertional dyspnea.  - He is on Xarelto now with stable CAD so can stop ASA.  - Did not tolerate ACEI or ARB with cough and fatigue.  Avoid beta blocker with bradycardia.   - ETT after followup in 6 months.  2. History of complete heart block: This was due to acute inferoposterior MI.  It resolved, no rhythm problems since that time.   3. Hyperlipidemia: Good lipids on Praluent and Zetia.  4. Atrial fibrillation: Paroxysmal.  S/p ablation.  Reveal ILR has shown brief runs of atrial fibrillation, overall low burden. CHADSVASC = 3.  He is now on Xarelto, can stop ASA.   Followup in 6 months.   Loralie Champagne 12/30/2014

## 2015-01-01 ENCOUNTER — Other Ambulatory Visit: Payer: Self-pay | Admitting: Cardiology

## 2015-01-01 ENCOUNTER — Other Ambulatory Visit: Payer: Self-pay | Admitting: *Deleted

## 2015-01-01 LAB — CARDIO IQ(R) ADVANCED LIPID PANEL
Apolipoprotein B: 38 mg/dL — ABNORMAL LOW (ref 52–109)
Cholesterol, Total: 100 mg/dL — ABNORMAL LOW (ref 125–200)
Cholesterol/HDL Ratio: 1.9 calc (ref <=5.0)
HDL CHOLESTEROL (CARDIO IQ ADV LIPID PANEL): 54 mg/dL (ref >=40)
LDL CHOLESTEROL CALCULATED (CARDIO IQ ADV LIPID PANEL): 33 mg/dL
LDL LARGE: 4979 nmol/L (ref 4334–10815)
LDL MEDIUM: 124 nmol/L — AB (ref 167–465)
LDL Particle Number: 659 nmol/L — ABNORMAL LOW (ref 1016–2185)
LDL Peak Size: 214.4 Angstrom — ABNORMAL LOW (ref >=218.2)
LDL SMALL: 109 nmol/L — AB (ref 123–441)
Non-HDL Cholesterol: 46 mg/dL
Triglycerides: 63 mg/dL

## 2015-01-01 MED ORDER — VITAMIN D (ERGOCALCIFEROL) 1.25 MG (50000 UNIT) PO CAPS: 50000.0000 [IU] | ORAL_CAPSULE | ORAL | Status: DC

## 2015-01-03 ENCOUNTER — Telehealth: Payer: Self-pay | Admitting: Cardiology

## 2015-01-03 NOTE — Telephone Encounter (Signed)
Patient called to review lab work.  Patient also wants to let Gay F[facility], know that he will be switching insurances from [employer] to [employer] at the beginning of the year. He requests paperwork done so new insurance will pay for his Praluent.  He st last time he was here, he presented the new card and check-in made a copy.  To Gay Filler.

## 2015-01-03 NOTE — Telephone Encounter (Signed)
Spoke with pt's wife.  We have the copy of insurance card so will work on [place] after 01/14/15.  He was instructed to come by the office if he needed another injection before that day.

## 2015-01-03 NOTE — Telephone Encounter (Signed)
New message  ° ° °Patient calling for test results.   °

## 2015-01-14 HISTORY — PX: OTHER SURGICAL HISTORY: SHX169

## 2015-01-18 MED ORDER — ALIROCUMAB 75 MG/ML ~~LOC~~ SOPN
75.0000 mg | PEN_INJECTOR | SUBCUTANEOUS | Status: DC
Start: 2015-01-18 — End: 2015-01-22

## 2015-01-18 NOTE — Addendum Note (Signed)
Addended by: Elberta Leatherwood R on: 01/18/2015 12:24 PM   Modules accepted: Orders

## 2015-01-18 NOTE — Telephone Encounter (Addendum)
Pt's Praluent approved 01/18/15-07/16/15.  Spoke with pt's wife.  New Rx sent to [facility].

## 2015-01-22 ENCOUNTER — Other Ambulatory Visit: Payer: Self-pay | Admitting: Pharmacist

## 2015-01-22 MED ORDER — ALIROCUMAB 75 MG/ML ~~LOC~~ SOPN: 75.0000 mg | PEN_INJECTOR | SUBCUTANEOUS | Status: DC

## 2015-01-29 ENCOUNTER — Other Ambulatory Visit: Payer: Self-pay | Admitting: *Deleted

## 2015-01-29 DIAGNOSIS — R55 Syncope and collapse: Secondary | ICD-10-CM

## 2015-01-29 DIAGNOSIS — Z01818 Encounter for other preprocedural examination: Secondary | ICD-10-CM

## 2015-02-02 ENCOUNTER — Telehealth: Payer: Self-pay | Admitting: *Deleted

## 2015-02-02 NOTE — Telephone Encounter (Signed)
Ok to speak w/ wife. Informed that the time for LINQ explant scheduled for 2/24 has been moved back 9:30 a.m . They are aware to arrive by 8 am for procedure. Patient's wife verbalized understanding and agreeable to plan.

## 2015-03-02 ENCOUNTER — Other Ambulatory Visit (INDEPENDENT_AMBULATORY_CARE_PROVIDER_SITE_OTHER): Payer: Medicare Other | Admitting: *Deleted

## 2015-03-02 DIAGNOSIS — Z01818 Encounter for other preprocedural examination: Secondary | ICD-10-CM

## 2015-03-02 DIAGNOSIS — R55 Syncope and collapse: Secondary | ICD-10-CM | POA: Diagnosis not present

## 2015-03-02 LAB — CBC WITH DIFFERENTIAL/PLATELET
BASOS PCT: 1 % (ref 0–1)
Basophils Absolute: 0.1 10*3/uL (ref 0.0–0.1)
EOS ABS: 0.5 10*3/uL (ref 0.0–0.7)
EOS PCT: 6 % — AB (ref 0–5)
HCT: 43.2 % (ref 39.0–52.0)
Hemoglobin: 14.1 g/dL (ref 13.0–17.0)
Lymphocytes Relative: 28 % (ref 12–46)
Lymphs Abs: 2.4 10*3/uL (ref 0.7–4.0)
MCH: 30.8 pg (ref 26.0–34.0)
MCHC: 32.6 g/dL (ref 30.0–36.0)
MCV: 94.3 fL (ref 78.0–100.0)
MONO ABS: 0.8 10*3/uL (ref 0.1–1.0)
MPV: 9.8 fL (ref 8.6–12.4)
Monocytes Relative: 9 % (ref 3–12)
NEUTROS ABS: 4.7 10*3/uL (ref 1.7–7.7)
Neutrophils Relative %: 56 % (ref 43–77)
Platelets: 238 10*3/uL (ref 150–400)
RBC: 4.58 MIL/uL (ref 4.22–5.81)
RDW: 14.5 % (ref 11.5–15.5)
WBC: 8.4 10*3/uL (ref 4.0–10.5)

## 2015-03-03 LAB — BASIC METABOLIC PANEL
BUN: 18 mg/dL (ref 7–25)
CALCIUM: 9.2 mg/dL (ref 8.6–10.3)
CO2: 25 mmol/L (ref 20–31)
Chloride: 105 mmol/L (ref 98–110)
Creat: 1 mg/dL (ref 0.70–1.18)
GLUCOSE: 97 mg/dL (ref 65–99)
Potassium: 4.5 mmol/L (ref 3.5–5.3)
SODIUM: 137 mmol/L (ref 135–146)

## 2015-03-09 ENCOUNTER — Encounter (HOSPITAL_COMMUNITY): Payer: Self-pay | Admitting: Internal Medicine

## 2015-03-09 ENCOUNTER — Ambulatory Visit (HOSPITAL_COMMUNITY)
Admission: RE | Admit: 2015-03-09 | Discharge: 2015-03-09 | Disposition: A | Discharge status: Home / Self Care | Payer: Medicare Other | Source: Ambulatory Visit | Attending: Internal Medicine | Admitting: Internal Medicine

## 2015-03-09 ENCOUNTER — Encounter (HOSPITAL_COMMUNITY)
Admission: RE | Disposition: A | Discharge status: Home / Self Care | Payer: Self-pay | Source: Ambulatory Visit | Attending: Internal Medicine

## 2015-03-09 DIAGNOSIS — I2582 Chronic total occlusion of coronary artery: Secondary | ICD-10-CM | POA: Insufficient documentation

## 2015-03-09 DIAGNOSIS — Z955 Presence of coronary angioplasty implant and graft: Secondary | ICD-10-CM | POA: Diagnosis not present

## 2015-03-09 DIAGNOSIS — I2510 Atherosclerotic heart disease of native coronary artery without angina pectoris: Secondary | ICD-10-CM | POA: Insufficient documentation

## 2015-03-09 DIAGNOSIS — E039 Hypothyroidism, unspecified: Secondary | ICD-10-CM | POA: Insufficient documentation

## 2015-03-09 DIAGNOSIS — I480 Paroxysmal atrial fibrillation: Secondary | ICD-10-CM | POA: Diagnosis present

## 2015-03-09 DIAGNOSIS — I10 Essential (primary) hypertension: Secondary | ICD-10-CM | POA: Diagnosis not present

## 2015-03-09 DIAGNOSIS — R002 Palpitations: Secondary | ICD-10-CM | POA: Diagnosis not present

## 2015-03-09 DIAGNOSIS — E785 Hyperlipidemia, unspecified: Secondary | ICD-10-CM | POA: Diagnosis not present

## 2015-03-09 DIAGNOSIS — Z87891 Personal history of nicotine dependence: Secondary | ICD-10-CM | POA: Insufficient documentation

## 2015-03-09 HISTORY — PX: EP IMPLANTABLE DEVICE: SHX172B

## 2015-03-09 SURGERY — LOOP RECORDER REMOVAL

## 2015-03-09 MED ORDER — GENTAMICIN SULFATE 40 MG/ML IJ SOLN
INTRAMUSCULAR | Status: AC
  Filled 2015-03-09: qty 2

## 2015-03-09 MED ORDER — LIDOCAINE-EPINEPHRINE 1 %-1:100000 IJ SOLN
INTRAMUSCULAR | Status: AC
  Filled 2015-03-09: qty 2

## 2015-03-09 MED ORDER — CHLORHEXIDINE GLUCONATE 4 % EX LIQD
60.0000 mL | Freq: Once | CUTANEOUS | Status: DC
  Filled 2015-03-09: qty 60

## 2015-03-09 MED ORDER — SODIUM CHLORIDE 0.9 % IV SOLN
INTRAVENOUS | Status: DC
  Administered 2015-03-09: 09:00:00 via INTRAVENOUS

## 2015-03-09 MED ORDER — CEFAZOLIN SODIUM-DEXTROSE 2-3 GM-% IV SOLR
2.0000 g | INTRAVENOUS | Status: AC
  Administered 2015-03-09: 2 g via INTRAVENOUS

## 2015-03-09 MED ORDER — SODIUM CHLORIDE 0.9 % IR SOLN
80.0000 mg | Status: AC
  Administered 2015-03-09: 80 mg
  Filled 2015-03-09: qty 2

## 2015-03-09 MED ORDER — LIDOCAINE-EPINEPHRINE 1 %-1:100000 IJ SOLN
INTRAMUSCULAR | Status: DC | PRN
Start: 2015-03-09 — End: 2015-03-09
  Administered 2015-03-09: 30 mL

## 2015-03-09 SURGICAL SUPPLY — 2 items
PACK LOOP INSERTION (CUSTOM PROCEDURE TRAY) ×2 IMPLANT
PAD DEFIB LIFELINK (PAD) ×1 IMPLANT

## 2015-03-09 NOTE — H&P (Signed)
Chief Complaint  Patient presents with  . PAF    History of Present Illness: Dustin Bauer is a 70 y.o. male who presents today for ILR removal. He has a h/o afib s/p ablation at [facility] by Dr Dustin Bauer in 2012. He has had no symptoms of afib since that time. He also had CAD. He had PCI to LAD in 2011 for stable angina. In 7/15, he passed out while in the gym. He was found to have complete heart block in the setting of an inferoposterior STEMI. He had temporary transvenous pacemaker placed and had Xience DES to totally occluded RCA. EF was 50-55% by LV-gram. After PCI, heart block resolved. Patient has been doing well since his MI. He walks 4-5 miles at a time and works out at a gym. No chest pain, no lightheadedness or syncope. Mild dyspnea walking up hills. No orthopnea or PND.   The patient is tolerating medications without difficulties and is otherwise without complaint today.    Past Medical History  Diagnosis Date  . Hyperlipidemia   . Hyperplasia of prostate   . Nicotine abuse   . ASCVD (arteriosclerotic cardiovascular disease)   . Presence of drug coated stent in LAD coronary artery   . Atrial fibrillation [facility])     s/p ablation by Dr Dustin Bauer  . Hydrocele, left   . Colon polyps   . Coronary artery disease   . Hypertension   . Hypothyroidism    Past Surgical History  Procedure Laterality Date  . Tonsillectomy and adenoidectomy    . Cardiac catheterization    . Coronary stent placement    . Left heart cath N/A 07/15/2013    Procedure: LEFT HEART CATH; Surgeon: Dustin Demark, MD; Location: [facility] CATH LAB; Service: Cardiovascular; Laterality: N/A;  . Atrial fibrillation ablation  07/2010    Afib ablation by Dr Dustin Bauer at [facility] in 2012  . Implantable loop recorder implantation  08/13/2010    MDT Reveal XT implanted by Dr Dustin Bauer for afib management post ablation    meds reviewed      Allergies: Crestor; Lipitor; and Statins   Social History: The patient  reports that he quit smoking about 11 years ago. His smoking use included Cigarettes. He smoked 0.25 packs per day. He does not have any smokeless tobacco history on file. He reports that he drinks about 0.6 oz of alcohol per week. He reports that he does not use illicit drugs.   Family History: The patient's family history includes Dementia in his mother; Heart disease in his father; Hyperlipidemia in his father.    ROS: Please see the history of present illness. All other systems are reviewed and negative.    PHYSICAL EXAM: Filed Vitals:   03/09/15 0742  BP: 169/84  Pulse: 47  Temp: 97.5 F (36.4 C)  Resp: 16   GEN: Well nourished, well developed, in no acute distress  HEENT: normal  Neck: no JVD, carotid bruits, or masses Cardiac: RRR; no murmurs, rubs, or gallops,no edema  Respiratory: clear to auscultation bilaterally, normal work of breathing GI: soft, nontender, nondistended, + BS MS: no deformity or atrophy  Skin: warm and dry, device pocket is well healed Neuro: Strength and sensation are intact Psych: euthymic mood, full affect   Recent Labs: 06/01/2014: BUN 18; Creatinine, Ser 0.96; Hemoglobin 14.6; Platelets 203.0; Potassium 4.6; Sodium 139; TSH 1.390 09/20/2014: ALT 15    Lipid Panel   Labs (Brief)       Component Value Date/Time  CHOL 111 09/20/2014 0930   CHOL 153 06/01/2014 1030   TRIG 58 09/20/2014 0930   HDL 60 09/20/2014 0930   HDL 47.10 06/01/2014 1030   CHOLHDL 1.9 09/20/2014 0930   CHOLHDL 3 06/01/2014 1030   VLDL 12.2 06/01/2014 1030   LDLCALC 39 09/20/2014 0930   LDLCALC 94 06/01/2014 1030       Wt Readings from Last 3 Encounters:  11/27/14 169 lb (76.658 kg)  06/01/14 169 lb (76.658 kg)  06/01/14 170 lb (77.111 kg)      Other studies Reviewed: Additional studies/ records  that were reviewed today include: Dr Dustin Bauer notes   ASSESSMENT AND PLAN:  1. Paroxysmal atrial fibrillation Doing well s/p ablation without symptoms of afib ILR last visit revealed low afib burden though he does have occasional episodes chasd2vasc score is at least 3. He is on xarelto.  His ILR battery has quit working completely. I discussed implantable loop recorder removal with the patient today. Risks of this procedure were discussed at length. He would like to proceed at this time.   Dustin Grayer MD, [facility] 03/09/2015 8:00 AM

## 2015-03-09 NOTE — Discharge Instructions (Signed)
Sterile Tape Wound Care Some cuts and wounds can be closed using sterile tape, also called skin adhesive strips. Skin adhesive strips can be used for shallow (superficial) and simple cuts, wounds, lacerations, and surgical incisions. These strips act in place of stitches to hold the edges of the wound together, allowing for faster healing. Unlike stitches, the adhesive strips do not require needles or anesthetic medicine for placement. The strips will wear off naturally as the wound is healing. It is important to take proper care of your wound at home while it heals.  HOME CARE INSTRUCTIONS  Try to keep the area around your wound clean and dry. Do not allow the adhesive strips to get wet for the first 24 hours.   Do not use any soaps or ointments on the wound for the first 24 hours.   If a bandage (dressing) has been applied, follow your health care provider's instructions for how often to change the dressing. Keep the dressing dry if one has been applied.   Do not remove the adhesive strips. They will fall off on their own. If they do not, you may remove them gently after 10 days. You should gently wet the strips before removing them. For example, this can be done in the shower.  Do not scratch, pick, or rub the wound area.   Protect the wound from further injury until it is healed.   Protect the wound from sun and tanning bed exposure while it is healing and for several weeks after healing.   Only take over-the-counter or prescription medicines as directed by your health care provider.   Keep all follow-up appointments as directed by your health care provider.  SEEK MEDICAL CARE IF: Your adhesive strips become wet or soaked with blood before the wound has healed. The tape will need to be replaced.  SEEK IMMEDIATE MEDICAL CARE IF:  You have increasing pain in the wound.   You develop a rash after the strips are applied.  Your wound becomes red, swollen, hot, or tender.   You  have a red streak that goes away from the wound.   You have pus coming from the wound.   You have increased bleeding from the wound.  You notice a bad smell coming from the wound.   Your wound breaks open. MAKE SURE YOU:  Understand these instructions.  Will watch your condition.  Will get help right away if you are not doing well or get worse.   This information is not intended to replace advice given to you by your health care provider. Make sure you discuss any questions you have with your health care provider.   Document Released: 02/07/2004 Document Revised: 01/20/2014 Document Reviewed: 07/21/2012 Elsevier Interactive Patient Education [employer].

## 2015-03-25 ENCOUNTER — Other Ambulatory Visit: Payer: Self-pay | Admitting: Family Medicine

## 2015-05-21 ENCOUNTER — Telehealth: Payer: Self-pay

## 2015-05-21 NOTE — Telephone Encounter (Signed)
Prior auth for Xarelto 20mg sent to [facility]. 

## 2015-05-22 ENCOUNTER — Other Ambulatory Visit: Payer: Self-pay | Admitting: *Deleted

## 2015-05-22 ENCOUNTER — Telehealth: Payer: Self-pay

## 2015-05-22 MED ORDER — RIVAROXABAN 20 MG PO TABS: ORAL_TABLET | ORAL | Status: DC

## 2015-05-22 NOTE — Telephone Encounter (Signed)
Xarelto approved through 01/13/2016. [id number].

## 2015-06-04 ENCOUNTER — Encounter: Payer: Self-pay | Admitting: Family Medicine

## 2015-06-04 ENCOUNTER — Ambulatory Visit (INDEPENDENT_AMBULATORY_CARE_PROVIDER_SITE_OTHER): Payer: Medicare Other | Admitting: Family Medicine

## 2015-06-04 VITALS — BP 129/82 | HR 76 | Temp 97.1°F | Ht 70.0 in | Wt 163.6 lb

## 2015-06-04 DIAGNOSIS — E559 Vitamin D deficiency, unspecified: Secondary | ICD-10-CM

## 2015-06-04 DIAGNOSIS — I2510 Atherosclerotic heart disease of native coronary artery without angina pectoris: Secondary | ICD-10-CM | POA: Diagnosis not present

## 2015-06-04 DIAGNOSIS — N400 Enlarged prostate without lower urinary tract symptoms: Secondary | ICD-10-CM

## 2015-06-04 DIAGNOSIS — R059 Cough, unspecified: Secondary | ICD-10-CM

## 2015-06-04 DIAGNOSIS — E785 Hyperlipidemia, unspecified: Secondary | ICD-10-CM | POA: Diagnosis not present

## 2015-06-04 DIAGNOSIS — R0989 Other specified symptoms and signs involving the circulatory and respiratory systems: Secondary | ICD-10-CM

## 2015-06-04 DIAGNOSIS — I10 Essential (primary) hypertension: Secondary | ICD-10-CM

## 2015-06-04 DIAGNOSIS — N433 Hydrocele, unspecified: Secondary | ICD-10-CM | POA: Diagnosis not present

## 2015-06-04 DIAGNOSIS — R634 Abnormal weight loss: Secondary | ICD-10-CM

## 2015-06-04 DIAGNOSIS — I480 Paroxysmal atrial fibrillation: Secondary | ICD-10-CM

## 2015-06-04 DIAGNOSIS — R05 Cough: Secondary | ICD-10-CM | POA: Diagnosis not present

## 2015-06-04 NOTE — Patient Instructions (Addendum)
We will arrange to get a CT scan of your abdomen and pelvis because of the unexpected weight loss and the positive family history for colon cancer and pancreatic cancer. We will also get a CT of the chest because of the history of cigarette smoking and persistent cough and congestion Please return the FOBT Please return to the office for fasting lab work Continue with current exercise regimen Continue with follow-up visits with cardiology According to our records your next colonoscopy is due in 1 year

## 2015-06-04 NOTE — Progress Notes (Signed)
Subjective:    Patient ID: Dustin Bauer, male    DOB: 19-Mar-1945, 70 y.o.   MRN: [id number]  HPI Patient is here today for his 6 month follow up on his hyperlipidemia and hypertension. Patient is also complaining with a 7lb weight loss since December and he has not been trying to loose weight. The patient recently had a sister that was diagnosed with pancreatic cancer. There is a positive for history of colon cancer in his family also. He is due for rectal exam and lab work. Patient has had a persistent cough and congestion for several weeks. It just doesn't seem to want to go away. He is also had a weight loss that is unexpected. He denies any chest pain or shortness of breath. He has been physically active and this could play a role with his weight loss. He has not seen any blood in the stool or had any black tarry bowel movements. He has no nausea or vomiting and does have occasional heartburn. He is passing his water without problems. He has a history of an enlarged prostate.   Review of Systems  Constitutional: Positive for unexpected weight change.  HENT: Negative.   Eyes: Negative.   Respiratory: Negative.   Cardiovascular: Negative.   Gastrointestinal: Negative.   Endocrine: Negative.   Genitourinary: Negative.   Musculoskeletal: Negative.   Skin: Negative.   Allergic/Immunologic: Negative.   Neurological: Negative.   Hematological: Negative.   Psychiatric/Behavioral: Negative.         Patient Active Problem List   Diagnosis Date Noted  . Hypertensive cardiovascular disease 04/17/2014  . CAD (coronary artery disease) 12/16/2013  . Acute MI, inferoposterior wall ([place]) 07/15/2013  . Abnormal chest CT 05/30/2013  . Elevated PSA 05/30/2013  . ASCVD (arteriosclerotic cardiovascular disease) 06/24/2012  . Hyperlipidemia 06/24/2012  . Paroxysmal atrial fibrillation ([place]) 06/24/2012  . Statin intolerance 06/24/2012   Outpatient Encounter Prescriptions as of 06/04/2015    Medication Sig  . acetaminophen (TYLENOL) 325 MG tablet Take 325 mg by mouth at bedtime as needed (pain/sleep).  . Alirocumab (PRALUENT) 75 MG/ML SOPN Inject 75 mg into the skin every 14 (fourteen) days.  Marland Kitchen ketoconazole (NIZORAL) 2 % cream Apply 1 application topically daily as needed for irritation.  Marland Kitchen MELATONIN PO Take 2 mg by mouth at bedtime.  Marland Kitchen Resveratrol 250 MG CAPS Take 250 mg by mouth daily.  . rivaroxaban (XARELTO) 20 MG TABS tablet TAKE 1 TABLET BY MOUTH EVERY DAY WITH SUPPER  . [DISCONTINUED] nitroGLYCERIN (NITROSTAT) 0.4 MG SL tablet Place 1 tablet (0.4 mg total) under the tongue every 5 (five) minutes as needed for chest pain. (Patient not taking: Reported on 03/06/2015)  . [DISCONTINUED] Vitamin D, Ergocalciferol, (DRISDOL) 50000 units CAPS capsule TAKE 1 CAPSULE (50,000 UNITS TOTAL) BY MOUTH EVERY 7 (SEVEN) DAYS.   No facility-administered encounter medications on file as of 06/04/2015.       Objective:   Physical Exam  Constitutional: He is oriented to person, place, and time. He appears well-developed and well-nourished. No distress.  The patient is pleasant and alert  HENT:  Head: Normocephalic and atraumatic.  Right Ear: External ear normal.  Left Ear: External ear normal.  Nose: Nose normal.  Mouth/Throat: Oropharynx is clear and moist. No oropharyngeal exudate.  Eyes: Conjunctivae and EOM are normal. Pupils are equal, round, and reactive to light. Right eye exhibits no discharge. Left eye exhibits no discharge. No scleral icterus.  Neck: Normal range of motion. Neck supple. No thyromegaly  present.  Neck without bruits thyromegaly or anterior cervical adenopathy  Cardiovascular: Normal rate, normal heart sounds and intact distal pulses.   No murmur heard. The heart is irregular irregular at 60/m  Pulmonary/Chest: Effort normal. No respiratory distress. He has no wheezes. He has no rales. He exhibits no tenderness.  Slight congestion with coughing otherwise clear  anteriorly and posteriorly  Abdominal: Soft. Bowel sounds are normal. He exhibits no mass. There is no tenderness. There is no rebound and no guarding.  No liver or spleen enlargement no epigastric tenderness no bruits  Genitourinary: Rectum normal and penis normal.  The prostate is enlarged and somewhat firm. There are no rectal masses. There is a hydrocele present on the left. The testicles were otherwise normal and the extra genitalia appeared within normal limits.  Musculoskeletal: Normal range of motion. He exhibits no edema or tenderness.  Lymphadenopathy:    He has no cervical adenopathy.  Neurological: He is alert and oriented to person, place, and time. He has normal reflexes. No cranial nerve deficit.  Skin: Skin is warm and dry. No rash noted.  Psychiatric: He has a normal mood and affect. His behavior is normal. Judgment and thought content normal.  Nursing note and vitals reviewed.  BP 129/82 mmHg  Pulse 76  Temp(Src) 97.1 F (36.2 C) (Oral)  Ht _0  (1.778 m)  Wt 163 lb 9.6 oz (74.208 kg)  BMI 23.47 kg/m2  EKG: Frequent PACs with first-degree A-V block and bradycardia        Assessment & Plan:  1. Essential hypertension -The blood pressure is good today and the patient will continue with current treatment - CBC with Differential/Platelet - BMP8+EGFR  2. Hyperlipidemia -Continue with current treatment pending results of lab work - [place], lipoprofile - Hepatic function panel  3. Vitamin D deficiency -Continue with current treatment pending results of lab work  4. Unexplained weight loss -The patient has a positive family history for colon cancer and pancreatic cancer. -He has had unexplained weight loss and we will arrange to get scans of the abdomen pelvis and chest because of his cough and congestion - Thyroid Panel With TSH - Amylase - Lipase - CT Abdomen Pelvis W Contrast; Future - CT Chest Wo Contrast; Future  5. Cough - CT Chest Wo  Contrast; Future  6. Chest congestion -This is been a persistent congestion issue and we will get a CT scan because of his history of smoking. - CT Chest Wo Contrast; Future  7. BPH (benign prostatic hypertrophy) -The patient has seen the urologist in the past because of an elevated PSA and has had the hydrocele confirmed by the urologist.  8. ASCVD (arteriosclerotic cardiovascular disease) -Continue to follow-up with cardiology  9. Hydrocele in adult -We will continue to monitor this.  10. Paroxysmal atrial fibrillation (HCC) -EKG showed frequent PACs with first-degree AV block --EKG today to document his atrophia which she is unaware of. - EKG 12-Lead  Patient Instructions  We will arrange to get a CT scan of your abdomen and pelvis because of the unexpected weight loss and the positive family history for colon cancer and pancreatic cancer. We will also get a CT of the chest because of the history of cigarette smoking and persistent cough and congestion Please return the FOBT Please return to the office for fasting lab work Continue with current exercise regimen Continue with follow-up visits with cardiology According to our records your next colonoscopy is due in 1 year  Arrie Senate MD

## 2015-06-05 LAB — NMR, LIPOPROFILE
Cholesterol: 123 mg/dL (ref 100–199)
HDL Cholesterol by NMR: 55 mg/dL (ref >39)
HDL Particle Number: 34.9 umol/L (ref >=30.5)
LDL Particle Number: 452 nmol/L (ref <1000)
LDL SIZE: 20.5 nm (ref >20.5)
LDL-C: 49 mg/dL (ref 0–99)
LP-IR Score: 41 (ref <=45)
SMALL LDL PARTICLE NUMBER: 230 nmol/L (ref <=527)
TRIGLYCERIDES BY NMR: 95 mg/dL (ref 0–149)

## 2015-06-05 LAB — CBC WITH DIFFERENTIAL/PLATELET
Basophils Absolute: 0 10*3/uL (ref 0.0–0.2)
Basos: 1 %
EOS (ABSOLUTE): 0.3 10*3/uL (ref 0.0–0.4)
Eos: 4 %
Hematocrit: 43.2 % (ref 37.5–51.0)
Hemoglobin: 14.1 g/dL (ref 12.6–17.7)
IMMATURE GRANS (ABS): 0 10*3/uL (ref 0.0–0.1)
Immature Granulocytes: 0 %
LYMPHS ABS: 2.3 10*3/uL (ref 0.7–3.1)
Lymphs: 34 %
MCH: 31.4 pg (ref 26.6–33.0)
MCHC: 32.6 g/dL (ref 31.5–35.7)
MCV: 96 fL (ref 79–97)
MONOS ABS: 0.6 10*3/uL (ref 0.1–0.9)
Monocytes: 8 %
NEUTROS ABS: 3.7 10*3/uL (ref 1.4–7.0)
Neutrophils: 53 %
PLATELETS: 196 10*3/uL (ref 150–379)
RBC: 4.49 x10E6/uL (ref 4.14–5.80)
RDW: 14.9 % (ref 12.3–15.4)
WBC: 6.8 10*3/uL (ref 3.4–10.8)

## 2015-06-05 LAB — BMP8+EGFR
BUN / CREAT RATIO: 15 (ref 10–24)
BUN: 16 mg/dL (ref 8–27)
CHLORIDE: 102 mmol/L (ref 96–106)
CO2: 22 mmol/L (ref 18–29)
Calcium: 9.7 mg/dL (ref 8.6–10.2)
Creatinine, Ser: 1.05 mg/dL (ref 0.76–1.27)
GFR calc non Af Amer: 72 mL/min/{1.73_m2} (ref >59)
GFR, EST AFRICAN AMERICAN: 83 mL/min/{1.73_m2} (ref >59)
Glucose: 67 mg/dL (ref 65–99)
Potassium: 4.9 mmol/L (ref 3.5–5.2)
SODIUM: 143 mmol/L (ref 134–144)

## 2015-06-05 LAB — THYROID PANEL WITH TSH
FREE THYROXINE INDEX: 1.9 (ref 1.2–4.9)
T3 UPTAKE RATIO: 28 % (ref 24–39)
T4, Total: 6.9 ug/dL (ref 4.5–12.0)
TSH: 1.65 u[IU]/mL (ref 0.450–4.500)

## 2015-06-05 LAB — HEPATIC FUNCTION PANEL
ALT: 14 IU/L (ref 0–44)
AST: 18 IU/L (ref 0–40)
Albumin: 4.3 g/dL (ref 3.5–4.8)
Alkaline Phosphatase: 82 IU/L (ref 39–117)
BILIRUBIN TOTAL: 0.3 mg/dL (ref 0.0–1.2)
Bilirubin, Direct: 0.12 mg/dL (ref 0.00–0.40)
TOTAL PROTEIN: 6.5 g/dL (ref 6.0–8.5)

## 2015-06-05 LAB — AMYLASE: AMYLASE: 76 U/L (ref 31–124)

## 2015-06-05 LAB — LIPASE: Lipase: 18 U/L (ref 0–59)

## 2015-06-08 ENCOUNTER — Ambulatory Visit (HOSPITAL_COMMUNITY)
Admission: RE | Admit: 2015-06-08 | Discharge: 2015-06-08 | Disposition: A | Discharge status: Home / Self Care | Payer: Medicare Other | Source: Ambulatory Visit | Attending: Family Medicine | Admitting: Family Medicine

## 2015-06-08 ENCOUNTER — Ambulatory Visit (HOSPITAL_COMMUNITY): Payer: Medicare Other

## 2015-06-08 DIAGNOSIS — K4090 Unilateral inguinal hernia, without obstruction or gangrene, not specified as recurrent: Secondary | ICD-10-CM | POA: Diagnosis not present

## 2015-06-08 DIAGNOSIS — N400 Enlarged prostate without lower urinary tract symptoms: Secondary | ICD-10-CM | POA: Insufficient documentation

## 2015-06-08 DIAGNOSIS — R918 Other nonspecific abnormal finding of lung field: Secondary | ICD-10-CM | POA: Diagnosis not present

## 2015-06-08 DIAGNOSIS — R0989 Other specified symptoms and signs involving the circulatory and respiratory systems: Secondary | ICD-10-CM

## 2015-06-08 DIAGNOSIS — N433 Hydrocele, unspecified: Secondary | ICD-10-CM | POA: Diagnosis not present

## 2015-06-08 DIAGNOSIS — R634 Abnormal weight loss: Secondary | ICD-10-CM | POA: Diagnosis present

## 2015-06-08 DIAGNOSIS — R059 Cough, unspecified: Secondary | ICD-10-CM

## 2015-06-08 DIAGNOSIS — I714 Abdominal aortic aneurysm, without rupture: Secondary | ICD-10-CM | POA: Diagnosis not present

## 2015-06-08 DIAGNOSIS — R05 Cough: Secondary | ICD-10-CM | POA: Insufficient documentation

## 2015-06-08 MED ORDER — IOPAMIDOL (ISOVUE-300) INJECTION 61%
100.0000 mL | Freq: Once | INTRAVENOUS | Status: AC | PRN
  Administered 2015-06-08: 100 mL via INTRAVENOUS

## 2015-06-12 ENCOUNTER — Telehealth: Payer: Self-pay

## 2015-06-12 ENCOUNTER — Other Ambulatory Visit: Payer: Self-pay | Admitting: *Deleted

## 2015-06-12 DIAGNOSIS — I714 Abdominal aortic aneurysm, without rupture, unspecified: Secondary | ICD-10-CM

## 2015-06-12 NOTE — Telephone Encounter (Signed)
Spoke with pt, scheduled tomorrow afternoon with BQ.  Nothing further needed.

## 2015-06-12 NOTE — Telephone Encounter (Signed)
-----   Message from Juanito Doom, MD sent at 06/12/2015  1:34 PM EDT ----- Fran Lowes,  This patient has been referred to [place] by Dr. Morrie Sheldon via Dr. Annamaria Boots.  He has multiple pulmonary nodules. These have increased in size recently and he needs to be seen in our clinic as soon as possible.  Can you look at my schedule and see how we can fit him in in the next 7 days?  Thanks, Ruby Cola

## 2015-06-13 ENCOUNTER — Ambulatory Visit (INDEPENDENT_AMBULATORY_CARE_PROVIDER_SITE_OTHER): Payer: Medicare Other | Admitting: Pulmonary Disease

## 2015-06-13 ENCOUNTER — Encounter: Payer: Self-pay | Admitting: Pulmonary Disease

## 2015-06-13 VITALS — BP 124/66 | HR 57 | Ht 70.0 in | Wt 163.0 lb

## 2015-06-13 DIAGNOSIS — R0600 Dyspnea, unspecified: Secondary | ICD-10-CM | POA: Insufficient documentation

## 2015-06-13 DIAGNOSIS — R918 Other nonspecific abnormal finding of lung field: Secondary | ICD-10-CM | POA: Diagnosis not present

## 2015-06-13 DIAGNOSIS — R0609 Other forms of dyspnea: Secondary | ICD-10-CM | POA: Insufficient documentation

## 2015-06-13 NOTE — Assessment & Plan Note (Signed)
He's had some mild dyspnea cough and mucus production ever since a cold. He's noted some very slow progression in dyspnea over the years and he wonders if this is related to his prior tobacco use. He is certainly at increased risk for COPD considering his heavy smoking for 25 years.  Plan: Simple spirometry

## 2015-06-13 NOTE — Addendum Note (Signed)
Addended by: Len Blalock on: 06/13/2015 03:43 PM   Modules accepted: Orders

## 2015-06-13 NOTE — Addendum Note (Signed)
Addended by: Mathis Dad on: 06/13/2015 03:43 PM   Modules accepted: Orders

## 2015-06-13 NOTE — Patient Instructions (Signed)
We will arrange for bronchoscopy at [facility] next week at 3:00 or 3:30 We will arrange for a repeat CT scan of your chest in August 2017 to follow-up the pulmonary nodules We will see you back in August 2017.

## 2015-06-13 NOTE — Progress Notes (Signed)
Subjective:    Patient ID: Dustin Bauer, male    DOB: 02/23/1945, 70 y.o.   MRN: [id number]  HPI Chief Complaint  Patient presents with  . Advice Only    Referred by Dr. Laurance Flatten for pulmonary nodules.      This is a pleasant 70 year old male who comes to my clinic today for evaluation of multiple pulmonary nodules. He previously had a pulmonary nodule noted identified in 2014 which was followed with serial CT scans through 2016. Never grew and was deemed to be benign. However, he developed a cough with mucus production after hearing his wife both caught a cold in February 2017. The symptoms persisted and he had some mild weight loss so he had a repeat CT scan performed in May. That CT scan showed multiple new pulmonary nodules throughout the bases of both lungs. No other nodules or any bigger than 6 mm in size but they had developed since the previous study in 2016.  He notes he's lost about 8-10 pounds over the last 2 years, without trying. He remains active and exercises every day. He walks is much as an hour a day and he says he likes to get sweaty from heart exercise. He has noticed a slow increase in dyspnea over the years, but it has not been severe. He is not limiting his exercise due to dyspnea.  He's had a cough ever since February which has improved but he is still producing mucus from time to time.  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 He has never worked with chemicals or fumes or any significant period of time. Next line he previously smoked one pack of cigarettes daily for 25 years from age 51 to about age [age].   Past Medical History  Diagnosis Date  . Hyperlipidemia   . Hyperplasia of prostate   . Nicotine abuse   . ASCVD (arteriosclerotic cardiovascular disease)   . Presence of drug coated stent in LAD coronary artery   . Atrial fibrillation [facility])     s/p ablation by Dr Roxan Hockey  . Hydrocele, left   . Colon polyps   .  Coronary artery disease   . Hypertension   . Hypothyroidism      Family History  Problem Relation Age of Onset  . Dementia Mother   . Heart disease Father   . Hyperlipidemia Father      Social History   Social History  . Marital Status: Married    Spouse Name: N/A  . Number of Children: N/A  . Years of Education: N/A   Occupational History  . Not on file.   Social History Main Topics  . Smoking status: Former Smoker -- 0.25 packs/day for 30 years    Types: Cigarettes    Quit date: 07/21/2003  . Smokeless tobacco: Never Used  . Alcohol Use: 0.6 oz/week    1 Glasses of wine per week     Comment: occasional wine  . Drug Use: No  . Sexual Activity: Yes   Other Topics Concern  . Not on file   Social History Narrative   Pt lives in [place] with wife.  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     Allergies  Allergen Reactions  . Clopidogrel Other (See Comments)    unknown reaction  . Crestor [Rosuvastatin Calcium] Other (See Comments)    Extreme muscular weakness  . Lipitor [Atorvastatin Calcium]  Other (See Comments)    Extreme muscular weakness   . Statins Other (See Comments)    Extreme muscular weakness      Outpatient Prescriptions Prior to Visit  Medication Sig Dispense Refill  . acetaminophen (TYLENOL) 325 MG tablet Take 325 mg by mouth at bedtime as needed (pain/sleep).    . Alirocumab (PRALUENT) 75 MG/ML SOPN Inject 75 mg into the skin every 14 (fourteen) days. 2 pen 6  . ketoconazole (NIZORAL) 2 % cream Apply 1 application topically daily as needed for irritation. 15 g 1  . MELATONIN PO Take 2 mg by mouth at bedtime.    Marland Kitchen Resveratrol 250 MG CAPS Take 250 mg by mouth daily.    . rivaroxaban (XARELTO) 20 MG TABS tablet TAKE 1 TABLET BY MOUTH EVERY DAY WITH SUPPER 90 tablet 2   No facility-administered medications prior to visit.       Review of Systems  Constitutional: Negative for fever  and unexpected weight change.  HENT: Positive for congestion. Negative for dental problem, ear pain, nosebleeds, postnasal drip, rhinorrhea, sinus pressure, sneezing, sore throat and trouble swallowing.   Eyes: Negative for redness and itching.  Respiratory: Positive for cough and shortness of breath. Negative for chest tightness and wheezing.   Cardiovascular: Negative for palpitations and leg swelling.  Gastrointestinal: Negative for nausea and vomiting.  Genitourinary: Negative for dysuria.  Musculoskeletal: Negative for joint swelling.  Skin: Negative for rash.  Neurological: Negative for headaches.  Hematological: Does not bruise/bleed easily.  Psychiatric/Behavioral: Negative for dysphoric mood. The patient is not nervous/anxious.        Objective:   Physical Exam Filed Vitals:   06/13/15 1440  BP: 124/66  Pulse: 57  Height: 5\' 10"  (1.778 m)  Weight: 163 lb (73.936 kg)  SpO2: 99%   RA  Gen: well appearing, no acute distress HENT: NCAT, OP clear, neck supple without masses Eyes: PERRL, EOMi Lymph: no cervical lymphadenopathy PULM: CTA B CV: RRR, no mgr, no JVD GI: BS+, soft, nontender, no hsm Derm: no rash or skin breakdown MSK: normal bulk and tone Neuro: A&Ox4, CN II-XII intact, strength 5/5 in all 4 extremities Psyche: normal mood and affect   06/08/2015 chest CT images personally reviewed showing multiple scattered pulmonary nodules, the majority of which are less than 6 mm in size. There is a 7 mm left lower lobe nodule which has been unchanged compared to prior studies. Most of the other nodules are new. There is no mediastinal lymphadenopathy. There is nonspecific atelectasis versus linear scars in the bases bilaterally which is very mild.     Assessment & Plan:  Multiple pulmonary nodules Today we discussed the findings from his CT chest. The 7 mm nodule followed since 2014 has not changed and is benign. However, there are multiple scattered pulmonary nodules  throughout both lungs none of which are larger than 6 mm. This is been associated with an acute illness in February (upper respiratory infection) with some persistent cough and weight loss since then. He also produces some mucus.  I explained to him today that the only way to know for certain what is causing these nodules would be to perform a biopsy. However, considering none of the nodules are bigger than 6 mm the likelihood of malignancy precludes the risk of proceeding with an open lung biopsy. They're too small for bronchoscopy. A PET scan would likely be positive but it could certainly be a false positive as the likelihood that these nodules are inflammatory  or infectious is quite high.  So I have advised to him that the best approach would be to perform a bronchoscopy with BAL to look for atypical infection such as mycobacterial disease and then to follow these with serial CT scans yet again. After much discussion he voiced understanding and is willing to proceed with this plan.  Plan: Bronchoscopy with BAL next week Repeat CT chest in August 2017 Follow-up August 2017  Dyspnea He's had some mild dyspnea cough and mucus production ever since a cold. He's noted some very slow progression in dyspnea over the years and he wonders if this is related to his prior tobacco use. He is certainly at increased risk for COPD considering his heavy smoking for 25 years.  Plan: Simple spirometry     Current outpatient prescriptions:  .  acetaminophen (TYLENOL) 325 MG tablet, Take 325 mg by mouth at bedtime as needed (pain/sleep)., Disp: , Rfl:  .  Alirocumab (PRALUENT) 75 MG/ML SOPN, Inject 75 mg into the skin every 14 (fourteen) days., Disp: 2 pen, Rfl: 6 .  ketoconazole (NIZORAL) 2 % cream, Apply 1 application topically daily as needed for irritation., Disp: 15 g, Rfl: 1 .  MELATONIN PO, Take 2 mg by mouth at bedtime., Disp: , Rfl:  .  Resveratrol 250 MG CAPS, Take 250 mg by mouth daily., Disp: ,  Rfl:  .  rivaroxaban (XARELTO) 20 MG TABS tablet, TAKE 1 TABLET BY MOUTH EVERY DAY WITH SUPPER, Disp: 90 tablet, Rfl: 2

## 2015-06-13 NOTE — Assessment & Plan Note (Signed)
Today we discussed the findings from his CT chest. The 7 mm nodule followed since 2014 has not changed and is benign. However, there are multiple scattered pulmonary nodules throughout both lungs none of which are larger than 6 mm. This is been associated with an acute illness in February (upper respiratory infection) with some persistent cough and weight loss since then. He also produces some mucus.  I explained to him today that the only way to know for certain what is causing these nodules would be to perform a biopsy. However, considering none of the nodules are bigger than 6 mm the likelihood of malignancy precludes the risk of proceeding with an open lung biopsy. They're too small for bronchoscopy. A PET scan would likely be positive but it could certainly be a false positive as the likelihood that these nodules are inflammatory or infectious is quite high.  So I have advised to him that the best approach would be to perform a bronchoscopy with BAL to look for atypical infection such as mycobacterial disease and then to follow these with serial CT scans yet again. After much discussion he voiced understanding and is willing to proceed with this plan.  Plan: Bronchoscopy with BAL next week Repeat CT chest in August 2017 Follow-up August 2017

## 2015-06-15 ENCOUNTER — Other Ambulatory Visit: Payer: Medicare Other

## 2015-06-15 DIAGNOSIS — Z1211 Encounter for screening for malignant neoplasm of colon: Secondary | ICD-10-CM

## 2015-06-18 ENCOUNTER — Encounter: Payer: Self-pay | Admitting: *Deleted

## 2015-06-18 ENCOUNTER — Telehealth: Payer: Self-pay | Admitting: Pulmonary Disease

## 2015-06-18 NOTE — Telephone Encounter (Signed)
Spoke with pt, advised that often respiratory calls the day before a procedure to discuss recs.  I advised pt that if he doesn't hear from them by 2:00 tomorrow to call us.  Pt expressed understanding, and questions were answered to best of my ability.  Nothing further needed.

## 2015-06-19 ENCOUNTER — Telehealth: Payer: Self-pay

## 2015-06-19 NOTE — Telephone Encounter (Signed)
thanks

## 2015-06-19 NOTE — Telephone Encounter (Signed)
-----   Message from Juanito Doom, MD sent at 06/18/2015  9:40 PM EDT ----- A, Please see if he is willing to have his bronchoscopy next week on Monday or Tuesday at [facility] at noon or anytime before 2 PM on Wed-Friday.  I forgot there was no NP on nights this week so I am basically working four 12 hours night shifts in a row and I can't really safely do his bronchoscopy in the middle of the day.   Please apologize for the inconvenience.  Ruby Cola

## 2015-06-19 NOTE — Telephone Encounter (Signed)
Bronch has been rescheduled to Thursday, June 15th at 1:00 at [facility].  Pt aware.   Forwarding to BQ as FYI.

## 2015-06-20 ENCOUNTER — Ambulatory Visit (HOSPITAL_COMMUNITY): Admission: RE | Admit: 2015-06-20 | Payer: Medicare Other | Source: Ambulatory Visit | Admitting: Pulmonary Disease

## 2015-06-20 ENCOUNTER — Encounter (HOSPITAL_COMMUNITY): Admission: RE | Payer: Self-pay | Source: Ambulatory Visit

## 2015-06-20 ENCOUNTER — Encounter (HOSPITAL_COMMUNITY): Payer: Medicare Other

## 2015-06-20 SURGERY — VIDEO BRONCHOSCOPY WITHOUT FLUORO
Anesthesia: Moderate Sedation | Laterality: Bilateral

## 2015-06-25 ENCOUNTER — Encounter: Payer: Self-pay | Admitting: Surgery

## 2015-06-28 ENCOUNTER — Ambulatory Visit (HOSPITAL_COMMUNITY): Admission: RE | Admit: 2015-06-28 | Payer: Medicare Other | Source: Ambulatory Visit | Admitting: Pulmonary Disease

## 2015-06-28 ENCOUNTER — Inpatient Hospital Stay (HOSPITAL_COMMUNITY)
Admission: RE | Admit: 2015-06-28 | Discharge: 2015-06-28 | Disposition: A | Discharge status: Home / Self Care | Payer: Medicare Other | Source: Ambulatory Visit

## 2015-06-28 ENCOUNTER — Encounter (HOSPITAL_COMMUNITY): Admission: RE | Payer: Self-pay | Source: Ambulatory Visit

## 2015-06-28 SURGERY — VIDEO BRONCHOSCOPY WITHOUT FLUORO
Anesthesia: Moderate Sedation | Laterality: Bilateral

## 2015-07-02 ENCOUNTER — Ambulatory Visit (INDEPENDENT_AMBULATORY_CARE_PROVIDER_SITE_OTHER): Payer: Medicare Other | Admitting: Surgery

## 2015-07-02 ENCOUNTER — Encounter: Payer: Self-pay | Admitting: Surgery

## 2015-07-02 VITALS — BP 124/68 | HR 49 | Ht 70.0 in | Wt 161.8 lb

## 2015-07-02 DIAGNOSIS — I714 Abdominal aortic aneurysm, without rupture, unspecified: Secondary | ICD-10-CM

## 2015-07-02 NOTE — Progress Notes (Signed)
[facility] [place]  Patient name: Dustin Bauer MRN: [id number] DOB: Nov 17, 1945 Sex: male  REFERRING PHYSICIAN: Dr. Laurance Flatten  REASON FOR CONSULT: AAA  HPI: Dustin Bauer is a 69 y.o. male, who is referred today for evaluation of an abdominal aortic aneurysm.  This was detected on a CT scan for unexpected weight loss.  This showed a 3.9 cm infrarenal abdominal aortic aneurysm.  The patient denies any abdominal pain or back pain.  Patient has a history of hypertension which is well-controlled.  He suffers from hypercholesterolemia but is statin intolerance secondary to extreme muscle weakness.  He has a history of tobacco abuse and is undergoing workup for chronic cough.  He is on anticoagulation for atrial fibrillation he has a history of coronary stenting  Past Medical History  Diagnosis Date  . Hyperlipidemia   . Hyperplasia of prostate   . Nicotine abuse   . ASCVD (arteriosclerotic cardiovascular disease)   . Presence of drug coated stent in LAD coronary artery   . Atrial fibrillation [facility])     s/p ablation by Dr Roxan Hockey  . Hydrocele, left   . Colon polyps   . Coronary artery disease   . Hypertension   . Hypothyroidism     Family History  Problem Relation Age of Onset  . Dementia Mother   . Heart disease Father   . Hyperlipidemia Father     SOCIAL HISTORY: Social History   Social History  . Marital Status: Married    Spouse Name: N/A  . Number of Children: N/A  . Years of Education: N/A   Occupational History  . Not on file.   Social History Main Topics  . Smoking status: Former Smoker -- 0.25 packs/day for 30 years    Types: Cigarettes    Quit date: 07/21/2003  . Smokeless tobacco: Never Used  . Alcohol Use: 0.6 oz/week    1 Glasses of wine per week     Comment: occasional wine  . Drug Use: No  . Sexual Activity: Yes   Other Topics Concern  . Not on file   Social History Narrative   Pt lives  in [place] with wife.  Retired from department of transportation.  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    Allergies  Allergen Reactions  . Clopidogrel Other (See Comments)    unknown reaction  . Crestor [Rosuvastatin Calcium] Other (See Comments)    Extreme muscular weakness  . Lipitor [Atorvastatin Calcium] Other (See Comments)    Extreme muscular weakness   . Statins Other (See Comments)    Extreme muscular weakness     Current Outpatient Prescriptions  Medication Sig Dispense Refill  . acetaminophen (TYLENOL) 325 MG tablet Take 325 mg by mouth at bedtime as needed (pain/sleep).    . Alirocumab (PRALUENT) 75 MG/ML SOPN Inject 75 mg into the skin every 14 (fourteen) days. 2 pen 6  . ketoconazole (NIZORAL) 2 % cream Apply 1 application topically daily as needed for irritation. 15 g 1  . MELATONIN PO Take 2 mg by mouth at bedtime.    Marland Kitchen Resveratrol 250 MG CAPS Take 250 mg by mouth daily.    . rivaroxaban (XARELTO) 20 MG TABS tablet TAKE 1 TABLET BY MOUTH EVERY DAY WITH SUPPER 90 tablet 2   No current facility-administered medications for this visit.    REVIEW OF SYSTEMS:  [X]  denotes positive finding, [ ]  denotes negative finding Cardiac  Comments:  Chest pain or  chest pressure:    Shortness of breath upon exertion:    Short of breath when lying flat:    Irregular heart rhythm: x       Vascular    Pain in calf, thigh, or hip brought on by ambulation:    Pain in feet at night that wakes you up from your sleep:     Blood clot in your veins:    Leg swelling:         Pulmonary    Oxygen at home:    Productive cough:     Wheezing:         Neurologic    Sudden weakness in arms or legs:     Sudden numbness in arms or legs:     Sudden onset of difficulty speaking or slurred speech:    Temporary loss of vision in one eye:     Problems with dizziness:         Gastrointestinal    Blood in stool:     Vomited blood:         Genitourinary    Burning when  urinating:     Blood in urine:        Psychiatric    Major depression:         Hematologic    Bleeding problems:    Problems with blood clotting too easily:        Skin    Rashes or ulcers:        Constitutional    Fever or chills:      PHYSICAL EXAM: Filed Vitals:   07/02/15 1108  BP: 124/68  Pulse: 49  Height: 5\' 10"  (1.778 m)  Weight: 161 lb 12.8 oz (73.392 kg)  SpO2: 98%    GENERAL: The patient is a well-nourished male, in no acute distress. The vital signs are documented above. CARDIAC: There is a regular rate and rhythm.  VASCULAR: No carotid bruits.  Pulsatile abdominal mass.  Palpable pedal pulses PULMONARY: There is good air exchange bilaterally without wheezing or rales. ABDOMEN: Soft and non-tender with normal pitched bowel sounds.  MUSCULOSKELETAL: There are no major deformities or cyanosis. NEUROLOGIC: No focal weakness or paresthesias are detected. SKIN: There are no ulcers or rashes noted. PSYCHIATRIC: The patient has a normal affect.  DATA:  I have reviewed his CT scan which reveals a 3.9 cm infrarenal abdominal aortic aneurysm  MEDICAL ISSUES: Abdominal aortic aneurysm: I discussed the signs and symptoms of rupture as well as the indications for repair.  Currently, I would recommend continued surveillance of his aneurysm with the next study being in 1 year.  I will arrange for him to have a ultrasound in my office with follow-up.  All his questions were answered today.   Annamarie Major, MD [facility] [phone] Pager [phone]

## 2015-07-13 ENCOUNTER — Other Ambulatory Visit: Payer: Self-pay | Admitting: Pharmacist

## 2015-07-13 MED ORDER — ALIROCUMAB 75 MG/ML ~~LOC~~ SOPN: 75.0000 mg | PEN_INJECTOR | SUBCUTANEOUS | Status: DC

## 2015-07-25 LAB — FECAL OCCULT BLOOD, IMMUNOCHEMICAL: Fecal Occult Bld: NEGATIVE

## 2015-08-10 ENCOUNTER — Telehealth: Payer: Self-pay | Admitting: Cardiology

## 2015-08-10 NOTE — Telephone Encounter (Signed)
New message    Pt c/o Shortness Of Breath: STAT if SOB developed within the last 24 hours or pt is noticeably SOB on the phone  1. Are you currently SOB (can you hear that pt is SOB on the phone)? no  2. How long have you been experiencing SOB? 2 or 3 weekscv  3. Are you SOB when sitting or when up moving around? Only when he exercise   4. Are you currently experiencing any other symptoms? bp low 88/60 lightheadedness

## 2015-08-10 NOTE — Telephone Encounter (Addendum)
Patient complaining of SOB with activity and low BP 110/90 (today). Patient stated he has been feeling this way for about 2 to 3 weeks. Patient stated he has some good days and then some bad. Patient stated today is a better day. Patient stated he was suppose to have an appointment with Dr. Aundra Dubin in June, but never received a letter. Since patient is having episodes of SOB with activity, made an appointment with Richardson Dopp PA on Monday, who can see Dr. Claris Gladden patients. Encouraged patient to call if his symptoms get worse or go to ED. Patient verbalized understanding. Will route to Dr. Aundra Dubin for further advisement.

## 2015-08-11 NOTE — Telephone Encounter (Signed)
Dustin Bauer, see if you can get Mr Lathem in with me this week please. May overbook.

## 2015-08-13 ENCOUNTER — Ambulatory Visit (INDEPENDENT_AMBULATORY_CARE_PROVIDER_SITE_OTHER): Payer: Medicare Other | Admitting: Physician Assistant

## 2015-08-13 ENCOUNTER — Encounter: Payer: Self-pay | Admitting: Physician Assistant

## 2015-08-13 ENCOUNTER — Encounter: Payer: Self-pay | Admitting: *Deleted

## 2015-08-13 VITALS — BP 110/70 | HR 50 | Ht 70.0 in | Wt 160.0 lb

## 2015-08-13 DIAGNOSIS — Z8679 Personal history of other diseases of the circulatory system: Secondary | ICD-10-CM | POA: Insufficient documentation

## 2015-08-13 DIAGNOSIS — E785 Hyperlipidemia, unspecified: Secondary | ICD-10-CM

## 2015-08-13 DIAGNOSIS — I480 Paroxysmal atrial fibrillation: Secondary | ICD-10-CM

## 2015-08-13 DIAGNOSIS — R0600 Dyspnea, unspecified: Secondary | ICD-10-CM | POA: Diagnosis not present

## 2015-08-13 DIAGNOSIS — I25119 Atherosclerotic heart disease of native coronary artery with unspecified angina pectoris: Secondary | ICD-10-CM | POA: Diagnosis not present

## 2015-08-13 LAB — CBC WITH DIFFERENTIAL/PLATELET
BASOS PCT: 0 %
Basophils Absolute: 0 cells/uL (ref 0–200)
EOS ABS: 588 {cells}/uL — AB (ref 15–500)
Eosinophils Relative: 7 %
HCT: 45.3 % (ref 38.5–50.0)
Hemoglobin: 15.3 g/dL (ref 13.2–17.1)
LYMPHS PCT: 31 %
Lymphs Abs: 2604 cells/uL (ref 850–3900)
MCH: 31.8 pg (ref 27.0–33.0)
MCHC: 33.8 g/dL (ref 32.0–36.0)
MCV: 94.2 fL (ref 80.0–100.0)
MONOS PCT: 7 %
MPV: 9.8 fL (ref 7.5–12.5)
Monocytes Absolute: 588 cells/uL (ref 200–950)
Neutro Abs: 4620 cells/uL (ref 1500–7800)
Neutrophils Relative %: 55 %
PLATELETS: 229 10*3/uL (ref 140–400)
RBC: 4.81 MIL/uL (ref 4.20–5.80)
RDW: 14.8 % (ref 11.0–15.0)
WBC: 8.4 10*3/uL (ref 3.8–10.8)

## 2015-08-13 LAB — BASIC METABOLIC PANEL
BUN: 18 mg/dL (ref 7–25)
CHLORIDE: 105 mmol/L (ref 98–110)
CO2: 27 mmol/L (ref 20–31)
CREATININE: 1.13 mg/dL (ref 0.70–1.18)
Calcium: 9.9 mg/dL (ref 8.6–10.3)
Glucose, Bld: 100 mg/dL — ABNORMAL HIGH (ref 65–99)
Potassium: 4.7 mmol/L (ref 3.5–5.3)
Sodium: 139 mmol/L (ref 135–146)

## 2015-08-13 LAB — PROTIME-INR
INR: 1.1
Prothrombin Time: 11.4 s (ref 9.0–11.5)

## 2015-08-13 MED ORDER — ASPIRIN EC 81 MG PO TBEC: 81.0000 mg | DELAYED_RELEASE_TABLET | Freq: Every day | ORAL | Status: DC

## 2015-08-13 NOTE — Telephone Encounter (Signed)
Pt scheduled to see Scott today at 11:45 AM.

## 2015-08-13 NOTE — Patient Instructions (Addendum)
Medication Instructions:  1. HOLD XARELTO FOR 2 DOSES BEFORE YOUR HEART CATH; DO NOT TAKE ANY ON WED 8/2 OR THURSDAY 8/3; CATH ON 08/17/15  2. YOU WILL START ASPIRIN 81 MG DAILY STARTING WED 08/15/15  Labwork: TODAY BMET, CBC, PT/INR  Testing/Procedures: Your physician has requested that you have a cardiac catheterization. Cardiac catheterization is used to diagnose and/or treat various heart conditions. Doctors may recommend this procedure for a number of different reasons. The most common reason is to evaluate chest pain. Chest pain can be a symptom of coronary artery disease (CAD), and cardiac catheterization can show whether plaque is narrowing or blocking your heart's arteries. This procedure is also used to evaluate the valves, as well as measure the blood flow and oxygen levels in different parts of your heart. For further information please visit [email or web address]. Please follow instruction sheet, as given.    Follow-Up: [place], [facility] 09/10/15 @ 1:45 POST CATH FOLLOW UP  Any Other Special Instructions Will Be Listed Below (If Applicable).     If you need a refill on your cardiac medications before your next appointment, please call your pharmacy.

## 2015-08-13 NOTE — H&P (Signed)
Cardiology Office Note:    Date:  08/13/2015   ID:  D[place]in [facility], DOB 05/27/1945, MRN [id number]  PCP:  Dustin Gainer, MD  Cardiologist:  Dr. Loralie [facility]   Electrophysiologist:  Dr. Thompson [facility]   Referring MD: Dustin Herb, MD   Chief Complaint  Patient presents with  . Shortness of Breath    History of Present Illness:    D[place] [facility] is a 70 y.o. male with a hx of CAD, PAF.  He had initial PCI to the LAD in 2011 for stable angina.  He is s/p ablation at [place] [facility] by Dr [place] [facility] in 2012 with ILR implant after procedure.  In 7/15, he passed out while in the gym.  He was found to have complete heart block in the setting of an inferoposterior STEMI.  He had temporary transvenous pacemaker placed and had Xience DES to totally occluded RCA.  EF was 50-55% by LV-gram.  After PCI, heart block resolved.   Last seen by Dr. Aundra [facility] 12/16. His ILR was removed in 2/17. In 3/17, he was seen by Dr. Trula [facility] for evaluation of a 3.9 cm AAA. This is being monitored with annual surveillance.    Patient called in last week with complaints of shortness of breath and low blood pressure. He is added on for further evaluation.    He is here alone today. Over the past 2-3 weeks, he has noted gradually worsening dyspnea with exertion with associated lightheadedness. This typically happens when he is exercising at his health club. He's also noted symptoms with walking up hills with his dog. He denies chest discomfort, arm or jaw discomfort. He denies frank syncope. Denies orthopnea, PND or edema. Denies any recent illnesses. Denies any bleeding issues. Symptoms are somewhat reminiscent of his angina prior to his previous myocardial infarction.  Past Medical History:  Diagnosis Date  . Allergy to ACE inhibitors    Cough and fatigue with ACEI  . CAD (coronary artery disease)    a. LHC in 2011 for stable angina >> DES to pLAD [facility])  //  b. inf-post STEMI 7/15 c/b CHB with jxn escape >> LHC: OM1 60-70, pLAD  30-40, m-d RCA occluded, EF 50-55 >> PCI: 3 x 33 mm Xience DES to RCA  . Colon polyps   . History of echocardiogram    Echo 12/15:  Mild LVH, EF 60-65%, normal wall motion, grade 1 diastolic dysfunction, LA upper limits of normal, mild RAE  . Hydrocele, left   . Hyperlipidemia    Myalgias with Zocor, Crestor, atorvastatin.   Marland Kitchen Hyperplasia of prostate   . Hypertension   . Hypothyroidism   . Nicotine abuse   . PAF (paroxysmal atrial fibrillation) [facility])    s/p ablation by Dr [facility] [facility] at [place] in 2012 // Xarelto anticoagulation  //  ILR removed 2/17    Past Surgical History:  Procedure Laterality Date  . ATRIAL FIBRILLATION ABLATION  07/2010   Afib ablation by Dr [facility]in [facility] at [place] [facility] in 2012  . CARDIAC CATHETERIZATION    . CORONARY STENT PLACEMENT    . EP IMPLANTABLE DEVICE N/A 03/09/2015   Procedure: Loop Recorder Removal;  Surgeon: Dustin Grayer, MD;  Location: [facility];  Service: Cardiovascular;  Laterality: N/A;  . implantable loop recorder implantation  08/13/2010   MDT Reveal XT implanted by Dr [facility] [facility] for afib management post ablation  . LEFT HEART CATH N/A 07/15/2013   Procedure: LEFT HEART CATH;  Surgeon: Dustin Demark, MD;  Location:  [facility];  Service: Cardiovascular;  Laterality: N/A;  . TONSILLECTOMY AND ADENOIDECTOMY      Current Medications: Outpatient Medications Prior to Visit  Medication Sig Dispense Refill  . acetaminophen (TYLENOL) 325 MG tablet Take 325 mg by mouth at bedtime as needed (pain/sleep).    . Alirocumab (PRALUENT) 75 MG/ML SOPN Inject 75 mg into the skin every 14 (fourteen) days. 2 pen 11  . ketoconazole (NIZORAL) 2 % cream Apply 1 application topically daily as needed for irritation. 15 g 1  . MELATONIN PO Take 2 mg by mouth at bedtime.    Marland Kitchen Resveratrol 250 MG CAPS Take 250 mg by mouth daily.    . rivaroxaban (XARELTO) 20 MG TABS tablet TAKE 1 TABLET BY MOUTH EVERY DAY WITH SUPPER 90 tablet 2   No facility-administered medications  prior to visit.       Allergies:   Clopidogrel; Crestor [rosuvastatin calcium]; Lipitor [atorvastatin calcium]; and Statins   Social History   Social History  . Marital status: Married    Spouse name: N/A  . Number of children: N/A  . Years of education: N/A   Social History Main Topics  . Smoking status: Former Smoker    Packs/day: 0.25    Years: 30.00    Types: Cigarettes    Quit date: 07/21/2003  . Smokeless tobacco: Never Used  . Alcohol use 0.6 oz/week    1 Glasses of wine per week     Comment: occasional wine  . Drug use: No  . Sexual activity: Yes   Other Topics Concern  . Not on file   Social History Narrative   Pt lives in [place] with wife.  Retired from [employer] of [employer].  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     Family History:  The patient's family history includes Dementia in his mother; Heart disease in his father; Hyperlipidemia in his father.   ROS:   Please see the history of present illness.    Review of Systems  Cardiovascular: Positive for dyspnea on exertion.  Hematologic/Lymphatic: Bruises/bleeds easily.  Neurological: Positive for dizziness and loss of balance.   All other systems reviewed and are negative.   Physical Exam:    VS:  BP 110/70   Pulse (!) 50   Ht 5\' 10"  (1.778 m)   Wt 160 lb (72.6 kg)   SpO2 97%   BMI 22.96 kg/m     Wt Readings from Last 3 Encounters:  07/02/15 161 lb 12.8 oz (73.4 kg)  06/13/15 163 lb (73.9 kg)  06/04/15 163 lb 9.6 oz (74.2 kg)     Physical Exam  Constitutional: He is oriented to person, place, and time. He appears well-developed and well-nourished. No distress.  HENT:  Head: Normocephalic and atraumatic.  Neck: No JVD present. Carotid bruit is not present.  Cardiovascular: Normal rate, regular rhythm and normal heart sounds.   No murmur heard. Pulmonary/Chest: Effort normal and breath sounds normal. He has no wheezes. He has no rales.  Abdominal: Soft. There is no  tenderness.  Musculoskeletal: He exhibits no edema.  Neurological: He is alert and oriented to person, place, and time.  Skin: Skin is warm and dry.  Psychiatric: He has a normal mood and affect.    Studies/Labs Reviewed:    EKG:  EKG is  ordered today.  The ekg ordered today demonstrates Sinus bradycardia, HR 52, normal axis, first-degree AV block, PR 234 ms, sinus arrhythmia, QTc 399 ms, downsloping ST segment  V3-V6, somewhat similar to prior tracings  Recent Labs: 03/02/2015: Hemoglobin 14.1 06/04/2015: ALT 14; BUN 16; Creatinine, Ser 1.05; Platelets 196; Potassium 4.9; Sodium 143; TSH 1.650   Recent Lipid Panel    Component Value Date/Time   CHOL 123 06/04/2015 1501   CHOL 100 (L) 12/28/2014 1609   TRIG 95 06/04/2015 1501   HDL 55 06/04/2015 1501   CHOLHDL 1.9 12/28/2014 1609   CHOLHDL 3 06/01/2014 1030   VLDL 12.2 06/01/2014 1030   LDLCALC 33 12/28/2014 1609    Additional studies/ records that were reviewed today include:   Echo 12/15 Mild LVH, EF 60-65%, normal wall motion, grade 1 diastolic dysfunction, LA upper limits of normal, mild RAE  LHC 7/15 LV showed inferior mid and basal wall hypokinesia, EF of 50-55% LM patent.   LAD has 30-40% proximal stenosis before the stent, stent patent.   OM1 60-70% smooth proximal stenosis.   RCA 100% occluded beyond the proximal portion with TIMI 0 flow. PCI:  3.0 x 33 mm Xience Alpine DES to RCA   ASSESSMENT:    1. Dyspnea   2. Coronary artery disease involving native coronary artery of native heart without angina pectoris   3. Hypertensive heart disease without heart failure   4. Hyperlipidemia   5. Paroxysmal atrial fibrillation (HCC)    PLAN:    In order of problems listed above:  1. Dyspnea - He presents with symptoms reminiscent of his previous angina prior to his myocardial infarction. Symptoms are gradually getting worse. He does have some downsloping ST segments in V3-V6. This is somewhat more prominent when  compared to prior ECGs. I reviewed his case with Dr. Aundra Bauer by telephone today. We agree that the patient should undergo cardiac catheterization for further evaluation and management. This will be arranged later this week.  Risks and benefits of cardiac catheterization have been discussed with the patient.  These include bleeding, infection, kidney damage, stroke, heart attack, death.  The patient understands these risks and is willing to proceed.   2. CAD -   s/p inferoposterior MI in 7/15.  EF 50-55% by LV-gram.  Echo in 12/15 with EF 60-65%.  He now presents with symptoms suggestive of exertional angina based upon his prior presentation.     -  Arrange LHC with Dr. Aundra [facility]auer  -  Hold Xarelto x 2 doses prior to cath  -  Start ASA 81 mg QD  -  Did not tolerate ACEI or ARB with cough and fatigue.  Avoid beta blocker with bradycardia.    3. Hyperlipidemia: Good lipids on Praluent.   5. PAF:  S/p ablation.  Reveal ILR previously showed brief runs of atrial fibrillation, overall low burden. CHADSVASC = 3.  He is on Xarelto.  ILR battery subsequently died and he is s/p explant.  Maintaining NSR.  -  Hold Xarelto x 2 doses prior to catheterization.     Signed, Richardson Dopp, PA-C  08/13/2015 8:48 AM    [facility] [address], [place], [place]  [address] Phone: [phone]; Fax: [phone]

## 2015-08-13 NOTE — Progress Notes (Signed)
Cardiology Office Note:    Date:  08/13/2015   ID:  [name], DOB 05/27/1945, MRN [id number]  PCP:  Redge Gainer, MD  Cardiologist:  Dr. Loralie Champagne   Electrophysiologist:  Dr. [name]   Referring MD: Chipper Herb, MD   Chief Complaint  Patient presents with  . Shortness of Breath    History of Present Illness:    [place] CARABETTA is a 70 y.o. male with a hx of CAD, PAF.  He had initial PCI to the LAD in 2011 for stable angina.  He is s/p ablation at [facility] by Dr Roxan Hockey in 2012 with ILR implant after procedure.  In 7/15, he passed out while in the gym.  He was found to have complete heart block in the setting of an inferoposterior STEMI.  He had temporary transvenous pacemaker placed and had Xience DES to totally occluded RCA.  EF was 50-55% by LV-gram.  After PCI, heart block resolved.   Last seen by Dr. Aundra Dubin 12/16. His ILR was removed in 2/17. In 3/17, he was seen by Dr. Trula Slade for evaluation of a 3.9 cm AAA. This is being monitored with annual surveillance.    Patient called in last week with complaints of shortness of breath and low blood pressure. He is added on for further evaluation.    He is here alone today. Over the past 2-3 weeks, he has noted gradually worsening dyspnea with exertion with associated lightheadedness. This typically happens when he is exercising at his health club. He's also noted symptoms with walking up hills with his dog. He denies chest discomfort, arm or jaw discomfort. He denies frank syncope. Denies orthopnea, PND or edema. Denies any recent illnesses. Denies any bleeding issues. Symptoms are somewhat reminiscent of his angina prior to his previous myocardial infarction.  Past Medical History:  Diagnosis Date  . Allergy to ACE inhibitors    Cough and fatigue with ACEI  . CAD (coronary artery disease)    a. LHC in 2011 for stable angina >> DES to pLAD [facility])  //  b. inf-post STEMI 7/15 c/b CHB with jxn escape >> LHC: OM1 60-70, pLAD  30-40, m-d RCA occluded, EF 50-55 >> PCI: 3 x 33 mm Xience DES to RCA  . Colon polyps   . History of echocardiogram    Echo 12/15:  Mild LVH, EF 60-65%, normal wall motion, grade 1 diastolic dysfunction, LA upper limits of normal, mild RAE  . Hydrocele, left   . Hyperlipidemia    Myalgias with Zocor, Crestor, atorvastatin.   Marland Kitchen Hyperplasia of prostate   . Hypertension   . Hypothyroidism   . Nicotine abuse   . PAF (paroxysmal atrial fibrillation) [facility])    s/p ablation by Dr Roxan Hockey at [facility] in 2012 // Xarelto anticoagulation  //  ILR removed 2/17    Past Surgical History:  Procedure Laterality Date  . ATRIAL FIBRILLATION ABLATION  07/2010   Afib ablation by Dr Roxan Hockey at [facility] in 2012  . CARDIAC CATHETERIZATION    . CORONARY STENT PLACEMENT    . EP IMPLANTABLE DEVICE N/A 03/09/2015   Procedure: Loop Recorder Removal;  Surgeon: [name], MD;  Location: [facility];  Service: Cardiovascular;  Laterality: N/A;  . implantable loop recorder implantation  08/13/2010   MDT Reveal XT implanted by Dr Roxan Hockey for afib management post ablation  . LEFT HEART CATH N/A 07/15/2013   Procedure: LEFT HEART CATH;  Surgeon: Clent Demark, MD;  Location:  [facility];  Service: Cardiovascular;  Laterality: N/A;  . TONSILLECTOMY AND ADENOIDECTOMY      Current Medications: Outpatient Medications Prior to Visit  Medication Sig Dispense Refill  . acetaminophen (TYLENOL) 325 MG tablet Take 325 mg by mouth at bedtime as needed (pain/sleep).    . Alirocumab (PRALUENT) 75 MG/ML SOPN Inject 75 mg into the skin every 14 (fourteen) days. 2 pen 11  . ketoconazole (NIZORAL) 2 % cream Apply 1 application topically daily as needed for irritation. 15 g 1  . MELATONIN PO Take 2 mg by mouth at bedtime.    Marland Kitchen Resveratrol 250 MG CAPS Take 250 mg by mouth daily.    . rivaroxaban (XARELTO) 20 MG TABS tablet TAKE 1 TABLET BY MOUTH EVERY DAY WITH SUPPER 90 tablet 2   No facility-administered medications  prior to visit.       Allergies:   Clopidogrel; Crestor [rosuvastatin calcium]; Lipitor [atorvastatin calcium]; and Statins   Social History   Social History  . Marital status: Married    Spouse name: N/A  . Number of children: N/A  . Years of education: N/A   Social History Main Topics  . Smoking status: Former Smoker    Packs/day: 0.25    Years: 30.00    Types: Cigarettes    Quit date: 07/21/2003  . Smokeless tobacco: Never Used  . Alcohol use 0.6 oz/week    1 Glasses of wine per week     Comment: occasional wine  . Drug use: No  . Sexual activity: Yes   Other Topics Concern  . None   Social History Narrative   Pt lives in [place] with wife.  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     Family History:  The patient's family history includes Dementia in his mother; Heart disease in his father; Hyperlipidemia in his father.   ROS:   Please see the history of present illness.    Review of Systems  Cardiovascular: Positive for dyspnea on exertion.  Hematologic/Lymphatic: Bruises/bleeds easily.  Neurological: Positive for dizziness and loss of balance.   All other systems reviewed and are negative.   Physical Exam:    VS:  BP 110/70   Pulse (!) 50   Ht 5\' 10"  (1.778 m)   Wt 160 lb (72.6 kg)   SpO2 97%   BMI 22.96 kg/m     Wt Readings from Last 3 Encounters:  08/13/15 160 lb (72.6 kg)  07/02/15 161 lb 12.8 oz (73.4 kg)  06/13/15 163 lb (73.9 kg)     Physical Exam  Constitutional: He is oriented to person, place, and time. He appears well-developed and well-nourished. No distress.  HENT:  Head: Normocephalic and atraumatic.  Neck: No JVD present. Carotid bruit is not present.  Cardiovascular: Normal rate, regular rhythm and normal heart sounds.   No murmur heard. Pulmonary/Chest: Effort normal and breath sounds normal. He has no wheezes. He has no rales.  Abdominal: Soft. There is no tenderness.    Musculoskeletal: He exhibits no edema.  Neurological: He is alert and oriented to person, place, and time.  Skin: Skin is warm and dry.  Psychiatric: He has a normal mood and affect.    Studies/Labs Reviewed:    EKG:  EKG is  ordered today.  The ekg ordered today demonstrates Sinus bradycardia, HR 52, normal axis, first-degree AV block, PR 234 ms, sinus arrhythmia, QTc 399 ms, downsloping ST segment V3-V6, somewhat similar  to prior tracings  Recent Labs: 03/02/2015: Hemoglobin 14.1 06/04/2015: ALT 14; BUN 16; Creatinine, Ser 1.05; Platelets 196; Potassium 4.9; Sodium 143; TSH 1.650   Recent Lipid Panel    Component Value Date/Time   CHOL 123 06/04/2015 1501   CHOL 100 (L) 12/28/2014 1609   TRIG 95 06/04/2015 1501   HDL 55 06/04/2015 1501   CHOLHDL 1.9 12/28/2014 1609   CHOLHDL 3 06/01/2014 1030   VLDL 12.2 06/01/2014 1030   LDLCALC 33 12/28/2014 1609    Additional studies/ records that were reviewed today include:   Echo 12/15 Mild LVH, EF 60-65%, normal wall motion, grade 1 diastolic dysfunction, LA upper limits of normal, mild RAE  LHC 7/15 LV showed inferior mid and basal wall hypokinesia, EF of 50-55% LM patent.   LAD has 30-40% proximal stenosis before the stent, stent patent.   OM1 60-70% smooth proximal stenosis.   RCA 100% occluded beyond the proximal portion with TIMI 0 flow. PCI:  3.0 x 33 mm Xience Alpine DES to RCA   ASSESSMENT:    1. Dyspnea   2. Coronary artery disease involving native coronary artery of native heart with angina pectoris ([facility])   3. Hyperlipidemia   4. Paroxysmal atrial fibrillation (HCC)    PLAN:    In order of problems listed above:  1. Dyspnea - He presents with symptoms reminiscent of his previous angina prior to his myocardial infarction. Symptoms are gradually getting worse. He does have some downsloping ST segments in V3-V6. This is somewhat more prominent when compared to prior ECGs. I reviewed his case with Dr. Aundra Dubin by  telephone today. We agree that the patient should undergo cardiac catheterization for further evaluation and management. This will be arranged later this week.  Risks and benefits of cardiac catheterization have been discussed with the patient.  These include bleeding, infection, kidney damage, stroke, heart attack, death.  The patient understands these risks and is willing to proceed.   -  Arrange LHC late this week.  -  If symptoms persist and cardiac cath fails to demonstrate significant findings, consider event monitor +/- ETT to determine if bradycardia is the culprit.  2. CAD -   s/p inferoposterior MI in 7/15.  EF 50-55% by LV-gram.  Echo in 12/15 with EF 60-65%.  He now presents with symptoms suggestive of exertional angina based upon his prior presentation.     -  Arrange LHC with Dr. Aundra Dubin  -  Hold Xarelto x 2 doses prior to cath  -  Start ASA 81 mg QD  -  Did not tolerate ACEI or ARB with cough and fatigue.  Avoid beta blocker with bradycardia.    3. Hyperlipidemia: Good lipids on Praluent.   5. PAF:  S/p ablation.  Reveal ILR previously showed brief runs of atrial fibrillation, overall low burden. CHADSVASC = 3.  He is on Xarelto.  ILR battery subsequently died and he is s/p explant.  Maintaining NSR.  -  Hold Xarelto x 2 doses prior to catheterization.    Signed, Richardson Dopp, PA-C  08/13/2015 1:33 PM    [facility] [address], [place], [place]  [address] Phone: [phone]; Fax: [phone]

## 2015-08-14 ENCOUNTER — Telehealth: Payer: Self-pay | Admitting: *Deleted

## 2015-08-14 NOTE — Telephone Encounter (Signed)
Pt notified of lab results ok for cath 8/4. Pt verbalized understanding to results given today.

## 2015-08-16 ENCOUNTER — Ambulatory Visit (INDEPENDENT_AMBULATORY_CARE_PROVIDER_SITE_OTHER)
Admission: RE | Admit: 2015-08-16 | Discharge: 2015-08-16 | Disposition: A | Discharge status: Home / Self Care | Payer: Medicare Other | Source: Ambulatory Visit | Attending: Pulmonary Disease | Admitting: Pulmonary Disease

## 2015-08-16 DIAGNOSIS — R918 Other nonspecific abnormal finding of lung field: Secondary | ICD-10-CM | POA: Diagnosis not present

## 2015-08-17 ENCOUNTER — Encounter (HOSPITAL_COMMUNITY): Payer: Self-pay | Admitting: Cardiology

## 2015-08-17 ENCOUNTER — Encounter (HOSPITAL_COMMUNITY)
Admission: RE | Disposition: A | Discharge status: Home / Self Care | Payer: Self-pay | Source: Ambulatory Visit | Attending: Cardiology

## 2015-08-17 ENCOUNTER — Ambulatory Visit (HOSPITAL_COMMUNITY)
Admission: RE | Admit: 2015-08-17 | Discharge: 2015-08-17 | Disposition: A | Discharge status: Home / Self Care | Payer: Medicare Other | Source: Ambulatory Visit | Attending: Cardiology | Admitting: Cardiology

## 2015-08-17 ENCOUNTER — Telehealth: Payer: Self-pay | Admitting: Pulmonary Disease

## 2015-08-17 DIAGNOSIS — I252 Old myocardial infarction: Secondary | ICD-10-CM | POA: Insufficient documentation

## 2015-08-17 DIAGNOSIS — I2582 Chronic total occlusion of coronary artery: Secondary | ICD-10-CM | POA: Diagnosis not present

## 2015-08-17 DIAGNOSIS — R0609 Other forms of dyspnea: Secondary | ICD-10-CM | POA: Diagnosis not present

## 2015-08-17 DIAGNOSIS — N400 Enlarged prostate without lower urinary tract symptoms: Secondary | ICD-10-CM | POA: Diagnosis not present

## 2015-08-17 DIAGNOSIS — T82855A Stenosis of coronary artery stent, initial encounter: Secondary | ICD-10-CM | POA: Diagnosis not present

## 2015-08-17 DIAGNOSIS — Z8249 Family history of ischemic heart disease and other diseases of the circulatory system: Secondary | ICD-10-CM | POA: Diagnosis not present

## 2015-08-17 DIAGNOSIS — R058 Other specified cough: Secondary | ICD-10-CM

## 2015-08-17 DIAGNOSIS — E785 Hyperlipidemia, unspecified: Secondary | ICD-10-CM | POA: Diagnosis not present

## 2015-08-17 DIAGNOSIS — I25119 Atherosclerotic heart disease of native coronary artery with unspecified angina pectoris: Secondary | ICD-10-CM | POA: Diagnosis not present

## 2015-08-17 DIAGNOSIS — I480 Paroxysmal atrial fibrillation: Secondary | ICD-10-CM | POA: Diagnosis not present

## 2015-08-17 DIAGNOSIS — E039 Hypothyroidism, unspecified: Secondary | ICD-10-CM | POA: Insufficient documentation

## 2015-08-17 DIAGNOSIS — Y712 Prosthetic and other implants, materials and accessory cardiovascular devices associated with adverse incidents: Secondary | ICD-10-CM | POA: Insufficient documentation

## 2015-08-17 DIAGNOSIS — Z7901 Long term (current) use of anticoagulants: Secondary | ICD-10-CM | POA: Insufficient documentation

## 2015-08-17 DIAGNOSIS — I10 Essential (primary) hypertension: Secondary | ICD-10-CM | POA: Insufficient documentation

## 2015-08-17 DIAGNOSIS — Z86010 Personal history of colonic polyps: Secondary | ICD-10-CM | POA: Diagnosis not present

## 2015-08-17 DIAGNOSIS — N433 Hydrocele, unspecified: Secondary | ICD-10-CM | POA: Insufficient documentation

## 2015-08-17 DIAGNOSIS — I442 Atrioventricular block, complete: Secondary | ICD-10-CM | POA: Insufficient documentation

## 2015-08-17 DIAGNOSIS — M791 Myalgia: Secondary | ICD-10-CM | POA: Diagnosis not present

## 2015-08-17 DIAGNOSIS — Z87891 Personal history of nicotine dependence: Secondary | ICD-10-CM | POA: Diagnosis not present

## 2015-08-17 DIAGNOSIS — R001 Bradycardia, unspecified: Secondary | ICD-10-CM | POA: Diagnosis not present

## 2015-08-17 DIAGNOSIS — I2510 Atherosclerotic heart disease of native coronary artery without angina pectoris: Secondary | ICD-10-CM | POA: Diagnosis not present

## 2015-08-17 DIAGNOSIS — R0600 Dyspnea, unspecified: Secondary | ICD-10-CM

## 2015-08-17 DIAGNOSIS — R918 Other nonspecific abnormal finding of lung field: Secondary | ICD-10-CM

## 2015-08-17 DIAGNOSIS — R05 Cough: Secondary | ICD-10-CM

## 2015-08-17 HISTORY — PX: CARDIAC CATHETERIZATION: SHX172

## 2015-08-17 SURGERY — LEFT HEART CATH AND CORONARY ANGIOGRAPHY
Anesthesia: LOCAL

## 2015-08-17 MED ORDER — IOPAMIDOL (ISOVUE-370) INJECTION 76%
INTRAVENOUS | Status: AC
  Filled 2015-08-17: qty 100

## 2015-08-17 MED ORDER — SODIUM CHLORIDE 0.9 % IV SOLN
INTRAVENOUS | Status: DC
  Administered 2015-08-17: 06:00:00 via INTRAVENOUS

## 2015-08-17 MED ORDER — LIDOCAINE HCL (PF) 1 % IJ SOLN
INTRAMUSCULAR | Status: AC
  Filled 2015-08-17: qty 30

## 2015-08-17 MED ORDER — IOPAMIDOL (ISOVUE-370) INJECTION 76%
INTRAVENOUS | Status: DC | PRN
  Administered 2015-08-17: 75 mL via INTRA_ARTERIAL

## 2015-08-17 MED ORDER — SODIUM CHLORIDE 0.9 % WEIGHT BASED INFUSION: 3.0000 mL/kg/h | INTRAVENOUS | Status: AC

## 2015-08-17 MED ORDER — VERAPAMIL HCL 2.5 MG/ML IV SOLN
INTRAVENOUS | Status: AC
  Filled 2015-08-17: qty 2

## 2015-08-17 MED ORDER — FENTANYL CITRATE (PF) 100 MCG/2ML IJ SOLN
INTRAMUSCULAR | Status: DC | PRN
  Administered 2015-08-17: 25 ug via INTRAVENOUS

## 2015-08-17 MED ORDER — SODIUM CHLORIDE 0.9 % IV SOLN: 250.0000 mL | INTRAVENOUS | Status: DC | PRN

## 2015-08-17 MED ORDER — FENTANYL CITRATE (PF) 100 MCG/2ML IJ SOLN
INTRAMUSCULAR | Status: AC
  Filled 2015-08-17: qty 2

## 2015-08-17 MED ORDER — ASPIRIN 81 MG PO CHEW: 81.0000 mg | CHEWABLE_TABLET | ORAL | Status: AC

## 2015-08-17 MED ORDER — ACETAMINOPHEN 325 MG PO TABS: 650.0000 mg | ORAL_TABLET | ORAL | Status: DC | PRN

## 2015-08-17 MED ORDER — ONDANSETRON HCL 4 MG/2ML IJ SOLN: 4.0000 mg | Freq: Four times a day (QID) | INTRAMUSCULAR | Status: DC | PRN

## 2015-08-17 MED ORDER — MIDAZOLAM HCL 2 MG/2ML IJ SOLN
INTRAMUSCULAR | Status: AC
  Filled 2015-08-17: qty 2

## 2015-08-17 MED ORDER — HEPARIN (PORCINE) IN NACL 2-0.9 UNIT/ML-% IJ SOLN
INTRAMUSCULAR | Status: AC
  Filled 2015-08-17: qty 1500

## 2015-08-17 MED ORDER — MIDAZOLAM HCL 2 MG/2ML IJ SOLN
INTRAMUSCULAR | Status: DC | PRN
  Administered 2015-08-17: 1 mg via INTRAVENOUS

## 2015-08-17 MED ORDER — SODIUM CHLORIDE 0.9% FLUSH: 3.0000 mL | INTRAVENOUS | Status: DC | PRN

## 2015-08-17 MED ORDER — HEPARIN (PORCINE) IN NACL 2-0.9 UNIT/ML-% IJ SOLN
INTRAMUSCULAR | Status: DC | PRN
  Administered 2015-08-17: 1000 mL

## 2015-08-17 MED ORDER — SODIUM CHLORIDE 0.9% FLUSH: 3.0000 mL | Freq: Two times a day (BID) | INTRAVENOUS | Status: DC

## 2015-08-17 MED ORDER — LIDOCAINE HCL (PF) 1 % IJ SOLN
INTRAMUSCULAR | Status: DC | PRN
  Administered 2015-08-17: 2 mL via INTRADERMAL
  Administered 2015-08-17: 16 mL via INTRADERMAL

## 2015-08-17 SURGICAL SUPPLY — 14 items
CATH INFINITI 5FR JL4 (CATHETERS) ×1 IMPLANT
CATH INFINITI 5FR MULTPACK ANG (CATHETERS) ×1 IMPLANT
CATH SITESEER 5F MULTI A 2 (CATHETERS) ×1 IMPLANT
COVER PRB 48X5XTLSCP FOLD TPE (BAG) IMPLANT
COVER PROBE 5X48 (BAG) ×2
GLIDESHEATH SLEND SS 6F .021 (SHEATH) ×1 IMPLANT
KIT HEART LEFT (KITS) ×2 IMPLANT
PACK CARDIAC CATHETERIZATION (CUSTOM PROCEDURE TRAY) ×2 IMPLANT
SHEATH PINNACLE 5F 10CM (SHEATH) ×1 IMPLANT
SYR MEDRAD MARK V 150ML (SYRINGE) ×2 IMPLANT
TRANSDUCER W/STOPCOCK (MISCELLANEOUS) ×2 IMPLANT
TUBING CIL FLEX 10 FLL-RA (TUBING) ×2 IMPLANT
WIRE EMERALD 3MM-J .035X150CM (WIRE) ×1 IMPLANT
WIRE EMERALD 3MM-J .035X260CM (WIRE) ×2 IMPLANT

## 2015-08-17 NOTE — Progress Notes (Signed)
Site area: RFA Site Prior to Removal:  Level 0 Pressure Applied For:59min Manual:  yes  Patient Status During Pull:  stable Post Pull Site:  Level 0 Post Pull Instructions Given:  yes Post Pull Pulses Present: palpable Dressing Applied:  tegaderm Bedrest begins @ 1000 till 1400 Comments:

## 2015-08-17 NOTE — Telephone Encounter (Signed)
Spoke with the pt  He is calling for ct chest results from today I advised it has not been reviewed yet, but we will call with results once available  Please advise, thanks!

## 2015-08-17 NOTE — Discharge Instructions (Signed)
Angiogram, Care After Refer to this sheet in the next few weeks. These instructions provide you with information about caring for yourself after your procedure. Your health care provider may also give you more specific instructions. Your treatment has been planned according to current medical practices, but problems sometimes occur. Call your health care provider if you have any problems or questions after your procedure. WHAT TO EXPECT AFTER THE PROCEDURE After your procedure, it is typical to have the following:  Bruising at the catheter insertion site that usually fades within 1-2 weeks.  Blood collecting in the tissue (hematoma) that may be painful to the touch. It should usually decrease in size and tenderness within 1-2 weeks. HOME CARE INSTRUCTIONS  Take medicines only as directed by your health care provider.  You may shower 24-48 hours after the procedure or as directed by your health care provider. Remove the bandage (dressing) and gently wash the site with plain soap and water. Pat the area dry with a clean towel. Do not rub the site, because this may cause bleeding.  Do not take baths, swim, or use a hot tub until your health care provider approves.  Check your insertion site every day for redness, swelling, or drainage.  Do not apply powder or lotion to the site.  Do not lift over 10 lb (4.5 kg) for 5 days after your procedure or as directed by your health care provider.  Ask your health care provider when it is okay to:  Return to work or school.  Resume usual physical activities or sports.  Resume sexual activity.  Do not drive home if you are discharged the same day as the procedure. Have someone else drive you.  You may drive 24 hours after the procedure unless otherwise instructed by your health care provider.  Do not operate machinery or power tools for 24 hours after the procedure or as directed by your health care provider.  If your procedure was done as an  outpatient procedure, which means that you went home the same day as your procedure, a responsible adult should be with you for the first 24 hours after you arrive home.  Keep all follow-up visits as directed by your health care provider. This is important. SEEK MEDICAL CARE IF:  You have a fever.  You have chills.  You have increased bleeding from the catheter insertion site. Hold pressure on the site. Call [place] IF:  You have unusual pain at the catheter insertion site.  You have redness, warmth, or swelling at the catheter insertion site.  You have drainage (other than a small amount of blood on the dressing) from the catheter insertion site.  The catheter insertion site is bleeding, and the bleeding does not stop after 30 minutes of holding steady pressure on the site.  The area near or just beyond the catheter insertion site becomes pale, cool, tingly, or numb.   This information is not intended to replace advice given to you by your health care provider. Make sure you discuss any questions you have with your health care provider.   Document Released: 07/18/2004 Document Revised: 01/20/2014 Document Reviewed: 06/02/2012 Elsevier Interactive Patient Education [employer].

## 2015-08-17 NOTE — Telephone Encounter (Signed)
I called [place] to let him know that his CT didn't show anything suggestive of a pneumonia.  He continues to have a cough, with phlegm, mucus production.    I told him that he needs a repeat CT in 6 months.  I would also like to have him provide a sputum culture (bacterial, fungal, AFB).   Will cc triage to arrange the CT and sputum culture

## 2015-08-17 NOTE — Interval H&P Note (Signed)
Cath Lab Visit (complete for each Cath Lab visit)  Clinical Evaluation Leading to the Procedure:   ACS: No.  Non-ACS:    Anginal Classification: CCS III  Anti-ischemic medical therapy: No Therapy  Non-Invasive Test Results: No non-invasive testing performed  Prior CABG: No previous CABG      History and Physical Interval Note:  08/17/2015 8:02 AM  [place]  has presented today for surgery, with the diagnosis of SOB, CAD  The various methods of treatment have been discussed with the patient and family. After consideration of risks, benefits and other options for treatment, the patient has consented to  Procedure(s): Left Heart Cath and Coronary Angiography (N/A) as a surgical intervention .  The patient's history has been reviewed, patient examined, no change in status, stable for surgery.  I have reviewed the patient's chart and labs.  Questions were answered to the patient's satisfaction.     [name] [employer]

## 2015-08-17 NOTE — H&P (View-Only) (Signed)
Cardiology Office Note:    Date:  08/13/2015   ID:  Dustin Bauer, DOB 10/02/1945, MRN [id number]  PCP:  Redge Gainer, MD  Cardiologist:  Dr. Loralie Champagne   Electrophysiologist:  Dr. [name]   Referring MD: Dustin Herb, MD   Chief Complaint  Patient presents with  . Shortness of Breath    History of Present Illness:    Dustin Bauer is a 70 y.o. male with a hx of CAD, PAF.  He had initial PCI to the LAD in 2011 for stable angina.  He is s/p ablation at [facility] by Dr Dustin Bauer in 2012 with ILR implant after procedure.  In 7/15, he passed out while in the gym.  He was found to have complete heart block in the setting of an inferoposterior STEMI.  He had temporary transvenous pacemaker placed and had Xience DES to totally occluded RCA.  EF was 50-55% by LV-gram.  After PCI, heart block resolved.   Last seen by Dr. Aundra Bauer 12/16. His ILR was removed in 2/17. In 3/17, he was seen by Dr. Trula Bauer for evaluation of a 3.9 cm AAA. This is being monitored with annual surveillance.    Patient called in last week with complaints of shortness of breath and low blood pressure. He is added on for further evaluation.    He is here alone today. Over the past 2-3 weeks, he has noted gradually worsening dyspnea with exertion with associated lightheadedness. This typically happens when he is exercising at his health club. He's also noted symptoms with walking up hills with his dog. He denies chest discomfort, arm or jaw discomfort. He denies frank syncope. Denies orthopnea, PND or edema. Denies any recent illnesses. Denies any bleeding issues. Symptoms are somewhat reminiscent of his angina prior to his previous myocardial infarction.  Past Medical History:  Diagnosis Date  . Allergy to ACE inhibitors    Cough and fatigue with ACEI  . CAD (coronary artery disease)    a. LHC in 2011 for stable angina >> DES to pLAD [facility])  //  b. inf-post STEMI 7/15 c/b CHB with jxn escape >> LHC: OM1 60-70, pLAD  30-40, m-d RCA occluded, EF 50-55 >> PCI: 3 x 33 mm Xience DES to RCA  . Colon polyps   . History of echocardiogram    Echo 12/15:  Mild LVH, EF 60-65%, normal wall motion, grade 1 diastolic dysfunction, LA upper limits of normal, mild RAE  . Hydrocele, left   . Hyperlipidemia    Myalgias with Zocor, Crestor, atorvastatin.   Marland Kitchen Hyperplasia of prostate   . Hypertension   . Hypothyroidism   . Nicotine abuse   . PAF (paroxysmal atrial fibrillation) [facility])    s/p ablation by Dr Dustin Bauer at [facility] in 2012 // Xarelto anticoagulation  //  ILR removed 2/17    Past Surgical History:  Procedure Laterality Date  . ATRIAL FIBRILLATION ABLATION  07/2010   Afib ablation by Dr Dustin Bauer at [facility] in 2012  . CARDIAC CATHETERIZATION    . CORONARY STENT PLACEMENT    . EP IMPLANTABLE DEVICE N/A 03/09/2015   Procedure: Loop Recorder Removal;  Surgeon: [name], MD;  Location: [place] CV LAB;  Service: Cardiovascular;  Laterality: N/A;  . implantable loop recorder implantation  08/13/2010   MDT Reveal XT implanted by Dr Dustin Bauer for afib management post ablation  . LEFT HEART CATH N/A 07/15/2013   Procedure: LEFT HEART CATH;  Surgeon: Clent Demark, MD;  Location:  [place] CATH LAB;  Service: Cardiovascular;  Laterality: N/A;  . TONSILLECTOMY AND ADENOIDECTOMY      Current Medications: Outpatient Medications Prior to Visit  Medication Sig Dispense Refill  . acetaminophen (TYLENOL) 325 MG tablet Take 325 mg by mouth at bedtime as needed (pain/sleep).    . Alirocumab (PRALUENT) 75 MG/ML SOPN Inject 75 mg into the skin every 14 (fourteen) days. 2 pen 11  . ketoconazole (NIZORAL) 2 % cream Apply 1 application topically daily as needed for irritation. 15 g 1  . MELATONIN PO Take 2 mg by mouth at bedtime.    Marland Kitchen Resveratrol 250 MG CAPS Take 250 mg by mouth daily.    . rivaroxaban (XARELTO) 20 MG TABS tablet TAKE 1 TABLET BY MOUTH EVERY DAY WITH SUPPER 90 tablet 2   No facility-administered medications  prior to visit.       Allergies:   Clopidogrel; Crestor [rosuvastatin calcium]; Lipitor [atorvastatin calcium]; and Statins   Social History   Social History  . Marital status: Married    Spouse name: N/A  . Number of children: N/A  . Years of education: N/A   Social History Main Topics  . Smoking status: Former Smoker    Packs/day: 0.25    Years: 30.00    Types: Cigarettes    Quit date: 07/21/2003  . Smokeless tobacco: Never Used  . Alcohol use 0.6 oz/week    1 Glasses of wine per week     Comment: occasional wine  . Drug use: No  . Sexual activity: Yes   Other Topics Concern  . None   Social History Narrative   Pt lives in [place] with wife.  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     Family History:  The patient's family history includes Dementia in his mother; Heart disease in his father; Hyperlipidemia in his father.   ROS:   Please see the history of present illness.    Review of Systems  Cardiovascular: Positive for dyspnea on exertion.  Hematologic/Lymphatic: Bruises/bleeds easily.  Neurological: Positive for dizziness and loss of balance.   All other systems reviewed and are negative.   Physical Exam:    VS:  BP 110/70   Pulse (!) 50   Ht 5\' 10"  (1.778 m)   Wt 160 lb (72.6 kg)   SpO2 97%   BMI 22.96 kg/m     Wt Readings from Last 3 Encounters:  08/13/15 160 lb (72.6 kg)  07/02/15 161 lb 12.8 oz (73.4 kg)  06/13/15 163 lb (73.9 kg)     Physical Exam  Constitutional: He is oriented to person, place, and time. He appears well-developed and well-nourished. No distress.  HENT:  Head: Normocephalic and atraumatic.  Neck: No JVD present. Carotid bruit is not present.  Cardiovascular: Normal rate, regular rhythm and normal heart sounds.   No murmur heard. Pulmonary/Chest: Effort normal and breath sounds normal. He has no wheezes. He has no rales.  Abdominal: Soft. There is no tenderness.    Musculoskeletal: He exhibits no edema.  Neurological: He is alert and oriented to person, place, and time.  Skin: Skin is warm and dry.  Psychiatric: He has a normal mood and affect.    Studies/Labs Reviewed:    EKG:  EKG is  ordered today.  The ekg ordered today demonstrates Sinus bradycardia, HR 52, normal axis, first-degree AV block, PR 234 ms, sinus arrhythmia, QTc 399 ms, downsloping ST segment V3-V6, somewhat similar  to prior tracings  Recent Labs: 03/02/2015: Hemoglobin 14.1 06/04/2015: ALT 14; BUN 16; Creatinine, Ser 1.05; Platelets 196; Potassium 4.9; Sodium 143; TSH 1.650   Recent Lipid Panel    Component Value Date/Time   CHOL 123 06/04/2015 1501   CHOL 100 (L) 12/28/2014 1609   TRIG 95 06/04/2015 1501   HDL 55 06/04/2015 1501   CHOLHDL 1.9 12/28/2014 1609   CHOLHDL 3 06/01/2014 1030   VLDL 12.2 06/01/2014 1030   LDLCALC 33 12/28/2014 1609    Additional studies/ records that were reviewed today include:   Echo 12/15 Mild LVH, EF 60-65%, normal wall motion, grade 1 diastolic dysfunction, LA upper limits of normal, mild RAE  LHC 7/15 LV showed inferior mid and basal wall hypokinesia, EF of 50-55% LM patent.   LAD has 30-40% proximal stenosis before the stent, stent patent.   OM1 60-70% smooth proximal stenosis.   RCA 100% occluded beyond the proximal portion with TIMI 0 flow. PCI:  3.0 x 33 mm Xience Alpine DES to RCA   ASSESSMENT:    1. Dyspnea   2. Coronary artery disease involving native coronary artery of native heart with angina pectoris ([place])   3. Hyperlipidemia   4. Paroxysmal atrial fibrillation (HCC)    PLAN:    In order of problems listed above:  1. Dyspnea - He presents with symptoms reminiscent of his previous angina prior to his myocardial infarction. Symptoms are gradually getting worse. He does have some downsloping ST segments in V3-V6. This is somewhat more prominent when compared to prior ECGs. I reviewed his case with Dr. Aundra Bauer by  telephone today. We agree that the patient should undergo cardiac catheterization for further evaluation and management. This will be arranged later this week.  Risks and benefits of cardiac catheterization have been discussed with the patient.  These include bleeding, infection, kidney damage, stroke, heart attack, death.  The patient understands these risks and is willing to proceed.   -  Arrange LHC late this week.  -  If symptoms persist and cardiac cath fails to demonstrate significant findings, consider event monitor +/- ETT to determine if bradycardia is the culprit.  2. CAD -   s/p inferoposterior MI in 7/15.  EF 50-55% by LV-gram.  Echo in 12/15 with EF 60-65%.  He now presents with symptoms suggestive of exertional angina based upon his prior presentation.     -  Arrange LHC with Dr. Aundra Bauer  -  Hold Xarelto x 2 doses prior to cath  -  Start ASA 81 mg QD  -  Did not tolerate ACEI or ARB with cough and fatigue.  Avoid beta blocker with bradycardia.    3. Hyperlipidemia: Good lipids on Praluent.   5. PAF:  S/p ablation.  Reveal ILR previously showed brief runs of atrial fibrillation, overall low burden. CHADSVASC = 3.  He is on Xarelto.  ILR battery subsequently died and he is s/p explant.  Maintaining NSR.  -  Hold Xarelto x 2 doses prior to catheterization.    Signed, Richardson Dopp, PA-C  08/13/2015 1:33 PM    [facility], [place], [place]  [address] Phone: [phone]; Fax: [phone]

## 2015-08-20 MED FILL — Verapamil HCl IV Soln 2.5 MG/ML: INTRAVENOUS | Qty: 2 | Status: AC

## 2015-08-20 NOTE — Addendum Note (Signed)
Addended by: Collier Salina on: 08/20/2015 09:34 AM   Modules accepted: Orders

## 2015-08-20 NOTE — Telephone Encounter (Signed)
Called and spoke to pt. Informed him of the results and recs per BQ. Order placed for repeat CT chest in 6 months and for sputum labs. Sputum cup placed up front for pick up. Pt verbalized understanding and denied any further questions or concerns at this time.

## 2015-08-27 ENCOUNTER — Encounter: Payer: Self-pay | Admitting: Physician Assistant

## 2015-09-09 NOTE — Progress Notes (Signed)
Cardiology Office Note:    Date:  09/10/2015   ID:  Dustin Bauer, DOB Apr 09, 1945, MRN [id number]  PCP:  Redge Gainer, MD  Cardiologist:  Dr. Loralie Champagne   Electrophysiologist:  Dr. [name]  VVS: Dr. Trula Slade  Referring MD: Chipper Herb, MD   Chief Complaint  Patient presents with  . Follow-up    s/p cath, bradycardia, dizziness    History of Present Illness:    Dustin Bauer is a 70 y.o. male with a hx of CAD, PAF, AAA.  He had initial PCI to the LAD in 2011 for stable angina.  He is s/p ablation at [facility] by Dr Roxan Hockey in 2012 with ILR implant after procedure.  In 7/15, he passed out while in the gym. He was found to have complete heart block in the setting of an inferoposterior STEMI. He had temporary transvenous pacemaker placed and had Xience DES to totally occluded RCA. EF was 50-55% by LV-gram. After PCI, heart block resolved.  His ILR was removed in 2/17.  He is followed by Dr. Trula Slade for 3.9 cm AAA.   I saw him 08/13/15 with c/o gradually worsening DOE and associated lightheadedness.  His symptoms were reminiscent of his prior angina and we arranged a cardiac cath.  This was done on 08/17/15 by Dr. Loralie Champagne and demonstrated patent proximal LAD and RCA stents.  He was bradycardic with HR in the 40s during the cath.  Recommendation was to proceed with an event monitor and an ETT to assess for chronotropic incompetence.  These tests have not been arranged yet.  He returns for FU.  Since his cath, he has felt better.  He feels like peace of mind has helped.  He is still exercising.  He denies any worsening DOE.  He denies significant dizziness.  He denies syncope.  He denies chest pain.  He denies orthopnea, PND, edema.   Prior CV studies that were reviewed today include:    LHC 08/17/15 LM 20% distal LM stenosis.  LAD 20% proximal LAD stenosis. Proximal LAD stent was patent with minimal in-stent restenosis.  RI 30% proximal stenosis.  LCx  Relatively small vessel  with luminal irregularities.  RCA  Patent proximal RCA stent with about 30% in-stent restenosis. 1. Patent LAD and RCA stents, nonobstructive coronary disease.  2. Preserved LV EF.  3. Bradycardic with HR in 40s during study.  He was having dizziness with exertion a week ago, ?significant bradycardia.  I will have him wear a 30 day event monitor and will have him do an exercise treadmill test to assess HR response to exercise.  I question whether his symptoms may not be related to chronotropic incompetence.  Followup with me in 2 wks.   Echo 12/15 Mild LVH, EF 60-65%, normal wall motion, grade 1 diastolic dysfunction, LA upper limits of normal, mild RAE  LHC 7/15 LV showed inferior mid and basal wall hypokinesia, EF of 50-55% LM patent.  LAD has 30-40% proximal stenosis before the stent, stent patent.  OM1 60-70% smooth proximal stenosis.  RCA 100% occluded beyond the proximal portion with TIMI 0 flow. PCI:  3.0 x 33 mm Xience Alpine DES to RCA  Past Medical History:  Diagnosis Date  . Allergy to ACE inhibitors    Cough and fatigue with ACEI  . CAD (coronary artery disease)    a. LHC in 2011 for stable angina >> DES to pLAD [facility])  //  b. inf-post STEMI 7/15 c/b CHB with  jxn escape >> LHC: OM1 60-70, pLAD 30-40, m-d RCA occluded, EF 50-55 >> PCI: 3 x 33 mm Xience DES to RCA  //  c. LHC 8/17: dLM 20, pLAD 20, pLAD stent ok, pRI 30, pRCA stent ok with 30% ISR  . Colon polyps   . History of echocardiogram    Echo 12/15:  Mild LVH, EF 60-65%, normal wall motion, grade 1 diastolic dysfunction, LA upper limits of normal, mild RAE  . Hydrocele, left   . Hyperlipidemia    Myalgias with Zocor, Crestor, atorvastatin.   Marland Kitchen Hyperplasia of prostate   . Hypertension   . Hypothyroidism   . Nicotine abuse   . PAF (paroxysmal atrial fibrillation) [facility])    s/p ablation by Dr Roxan Hockey at [facility] in 2012 // Xarelto anticoagulation  //  ILR removed 2/17    Past Surgical History:  Procedure  Laterality Date  . ATRIAL FIBRILLATION ABLATION  07/2010   Afib ablation by Dr Roxan Hockey at [facility] in 2012  . CARDIAC CATHETERIZATION    . CARDIAC CATHETERIZATION N/A 08/17/2015   Procedure: Left Heart Cath and Coronary Angiography;  Surgeon: Larey Dresser, MD;  Location: [place] CV LAB;  Service: Cardiovascular;  Laterality: N/A;  . CORONARY STENT PLACEMENT    . EP IMPLANTABLE DEVICE N/A 03/09/2015   Procedure: Loop Recorder Removal;  Surgeon: [name], MD;  Location: [place] CV LAB;  Service: Cardiovascular;  Laterality: N/A;  . implantable loop recorder implantation  08/13/2010   MDT Reveal XT implanted by Dr Roxan Hockey for afib management post ablation  . LEFT HEART CATH N/A 07/15/2013   Procedure: LEFT HEART CATH;  Surgeon: Clent Demark, MD;  Location: [facility] CATH LAB;  Service: Cardiovascular;  Laterality: N/A;  . TONSILLECTOMY AND ADENOIDECTOMY      Current Medications: Outpatient Medications Prior to Visit  Medication Sig Dispense Refill  . acetaminophen (TYLENOL) 325 MG tablet Take 325 mg by mouth at bedtime as needed (pain/sleep).    . Alirocumab (PRALUENT) 75 MG/ML SOPN Inject 75 mg into the skin every 14 (fourteen) days. 2 pen 11  . aspirin EC 81 MG tablet Take 1 tablet (81 mg total) by mouth daily.    Marland Kitchen ketoconazole (NIZORAL) 2 % cream Apply 1 application topically daily as needed for irritation. 15 g 1  . MELATONIN PO Take 2 mg by mouth at bedtime.    Marland Kitchen Resveratrol 250 MG CAPS Take 250 mg by mouth daily.    . rivaroxaban (XARELTO) 20 MG TABS tablet TAKE 1 TABLET BY MOUTH EVERY DAY WITH SUPPER 90 tablet 2   No facility-administered medications prior to visit.       Allergies:   Clopidogrel; Crestor [rosuvastatin calcium]; Lipitor [atorvastatin calcium]; and Statins   Social History   Social History  . Marital status: Married    Spouse name: N/A  . Number of children: N/A  . Years of education: N/A   Social History Main Topics  . Smoking status: Former  Smoker    Packs/day: 0.25    Years: 30.00    Types: Cigarettes    Quit date: 07/21/2003  . Smokeless tobacco: Never Used  . Alcohol use 0.6 oz/week    1 Glasses of wine per week     Comment: occasional wine  . Drug use: No  . Sexual activity: Yes   Other Topics Concern  . None   Social History Narrative   Pt lives in [place] with wife.  Retired from department of  transportation.  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     Family History:  The patient's family history includes Dementia in his mother; Heart disease in his father; Hyperlipidemia in his father.   ROS:   Please see the history of present illness.    ROS All other systems reviewed and are negative.   EKGs/Labs/Other Test Reviewed:    EKG:  EKG is  ordered today.  The ekg ordered today demonstrates sinus brady, PACs, HR 56, normal axis, 1st degree AVB, QTc 424 ms, similar to prior tracing.   Recent Labs: 06/04/2015: ALT 14; TSH 1.650 08/13/2015: BUN 18; Creat 1.13; Hemoglobin 15.3; Platelets 229; Potassium 4.7; Sodium 139   Recent Lipid Panel    Component Value Date/Time   CHOL 123 06/04/2015 1501   CHOL 100 (L) 12/28/2014 1609   TRIG 95 06/04/2015 1501   HDL 55 06/04/2015 1501   CHOLHDL 1.9 12/28/2014 1609   CHOLHDL 3 06/01/2014 1030   VLDL 12.2 06/01/2014 1030   LDLCALC 33 12/28/2014 1609     Physical Exam:    VS:  BP 126/72   Pulse (!) 56   Ht 5\' 10"  (1.778 m)   Wt 165 lb (74.8 kg)   BMI 23.68 kg/m     Wt Readings from Last 3 Encounters:  09/10/15 165 lb (74.8 kg)  08/17/15 160 lb (72.6 kg)  08/13/15 160 lb (72.6 kg)     Physical Exam  Constitutional: He is oriented to person, place, and time. He appears well-developed and well-nourished. No distress.  HENT:  Head: Normocephalic and atraumatic.  Eyes: No scleral icterus.  Neck: Normal range of motion. No JVD present.  Cardiovascular: Normal rate, regular rhythm, S1 normal and S2 normal.  Exam reveals no gallop and no  friction rub.   No murmur heard. Pulmonary/Chest: Effort normal and breath sounds normal. He has no wheezes. He has no rhonchi. He has no rales.  Abdominal: Soft. There is no tenderness.  Musculoskeletal: He exhibits no edema.  R groin without hematoma or bruit  Neurological: He is alert and oriented to person, place, and time.  Skin: Skin is warm and dry.  Psychiatric: He has a normal mood and affect.    ASSESSMENT:    1. Coronary artery disease involving native coronary artery of native heart without angina pectoris   2. Bradycardia   3. Hyperlipidemia   4. Paroxysmal atrial fibrillation (HCC)    PLAN:    In order of problems listed above:  1. CAD -   s/p inferoposterior MI in 7/15 c/b complete heart block.  CHB resolved with DES to RCA.  I saw him recently with complaints of DOE and associated dizziness.  Recent LHC with patent LAD and RCA stents.  Continue current medical regimen with Alirocumab, ASA.    2. Bradycardia - He has a hx of exertional dizziness.  He was noted to be bradycardic at his recent heart cath and it was questioned if his recent symptoms of dizziness and DOE were related to symptomatic bradycardia.  He was to be set up for an event monitor and an ETT.  These have not yet been arranged.  His symptoms are stable/somewhat improved with knowing that his cath was ok.  He notes that he will be very busy in September and would like to arrange these tests in October.  I have asked him to let us know if he has any worsening symptoms in the interim.   -  Arrange Event  monitor  -  Arrange ETT to assess for chronotropic incompetence.  3. Hyperlipidemia:  He remains on Praluent.  LDL in 9/16 was 39.    4. PAF:  S/p ablation. CHADSVASC = 3. He remains on Xarelto.  Creatinine clearance is 62.44 mL/min.  Continue Xarelto 20 mg QD.     Medication Adjustments/Labs and Tests Ordered: Current medicines are reviewed at length with the patient today.  Concerns regarding  medicines are outlined above.  Medication changes, Labs and Tests ordered today are outlined in the Patient Instructions noted below. Patient Instructions  Medication Instructions:  No changes to medications Labwork: No labwork Testing/Procedures: 1) Your physician has requested that you have an exercise tolerance test. For further information please visit [email or web address]. Please also follow instruction sheet, as given. 2)Your physician has recommended that you wear an event monitor. Event monitors are medical devices that record the heart's electrical activity. Doctors most often [place] these monitors to diagnose arrhythmias. Arrhythmias are problems with the speed or rhythm of the heartbeat. The monitor is a small, portable device. You can wear one while you do your normal daily activities. This is usually used to diagnose what is causing palpitations/syncope (passing out). Follow-Up: Dr Aundra Dubin November 27, 2015 at 4:00 PM   Testing as listed above to be completed prior to appointment with Dr Aundra Dubin.  Any Other Special Instructions Will Be Listed Below (If Applicable). If you need a refill on your cardiac medications before your next appointment, please call your pharmacy.  Signed, Richardson Dopp, PA-C  09/10/2015 2:52 PM    [facility] [place], [place],   [address] Phone: [phone]; Fax: [phone]

## 2015-09-10 ENCOUNTER — Ambulatory Visit (INDEPENDENT_AMBULATORY_CARE_PROVIDER_SITE_OTHER): Payer: Medicare Other | Admitting: Physician Assistant

## 2015-09-10 ENCOUNTER — Encounter: Payer: Self-pay | Admitting: Physician Assistant

## 2015-09-10 VITALS — BP 126/72 | HR 56 | Ht 70.0 in | Wt 165.0 lb

## 2015-09-10 DIAGNOSIS — I2510 Atherosclerotic heart disease of native coronary artery without angina pectoris: Secondary | ICD-10-CM | POA: Diagnosis not present

## 2015-09-10 DIAGNOSIS — E785 Hyperlipidemia, unspecified: Secondary | ICD-10-CM | POA: Diagnosis not present

## 2015-09-10 DIAGNOSIS — I480 Paroxysmal atrial fibrillation: Secondary | ICD-10-CM

## 2015-09-10 DIAGNOSIS — R001 Bradycardia, unspecified: Secondary | ICD-10-CM | POA: Diagnosis not present

## 2015-09-10 NOTE — Patient Instructions (Addendum)
Medication Instructions:  No changes to medications Labwork: No labwork Testing/Procedures: 1) Your physician has requested that you have an exercise tolerance test. For further information please visit [email or web address]. Please also follow instruction sheet, as given. 2)Your physician has recommended that you wear an event monitor. Event monitors are medical devices that record the heart's electrical activity. Doctors most often [place] these monitors to diagnose arrhythmias. Arrhythmias are problems with the speed or rhythm of the heartbeat. The monitor is a small, portable device. You can wear one while you do your normal daily activities. This is usually used to diagnose what is causing palpitations/syncope (passing out). Follow-Up: Dr Aundra Dubin November 27, 2015 at 4:00 PM   Testing as listed above to be completed prior to appointment with Dr Aundra Dubin.  Any Other Special Instructions Will Be Listed Below (If Applicable). If you need a refill on your cardiac medications before your next appointment, please call your pharmacy.

## 2015-09-13 ENCOUNTER — Encounter: Payer: Self-pay | Admitting: Pulmonary Disease

## 2015-09-13 ENCOUNTER — Ambulatory Visit (INDEPENDENT_AMBULATORY_CARE_PROVIDER_SITE_OTHER): Payer: Medicare Other | Admitting: Pulmonary Disease

## 2015-09-13 DIAGNOSIS — R0600 Dyspnea, unspecified: Secondary | ICD-10-CM | POA: Diagnosis not present

## 2015-09-13 DIAGNOSIS — R918 Other nonspecific abnormal finding of lung field: Secondary | ICD-10-CM

## 2015-09-13 NOTE — Assessment & Plan Note (Signed)
Resolved

## 2015-09-13 NOTE — Assessment & Plan Note (Signed)
He has multiple small pulmonary nodules which are migratory in nature therefore likely inflammatory. The most likely etiology is colonization with atypical mycobacterial disease of which she has no signs or symptoms of active infection. At this time we'll continue with serial monitoring with CT scans.  Plan: Repeat CT chest August 2018  F/u after that visit

## 2015-09-13 NOTE — Patient Instructions (Signed)
Stay active We will arrange for a CT scan of your chest in August 2018 to follow-up the pulmonary nodules We'll see you back after that CT scan

## 2015-09-13 NOTE — Progress Notes (Signed)
Subjective:    Patient ID: Dustin Bauer, male    DOB: Jul 26, 1945, 70 y.o.   MRN: [id number]  Synopsis: First seen in 2017 for evaluation of multiple pulmonary nodules.  HPI Chief Complaint  Patient presents with  . Follow-up    review CT chest.  pt states he is doing well, no complaints today.    Dustin Bauer has not had any respiratory complaints since the last visit.  He had an episode of bronchitis at the beginning of the month, some mucus production then. Weight stable, no fever or chills.  He no longer has any symptoms and feels great.  Past Medical History:  Diagnosis Date  . Allergy to ACE inhibitors    Cough and fatigue with ACEI  . CAD (coronary artery disease)    a. LHC in 2011 for stable angina >> DES to pLAD [facility])  //  b. inf-post STEMI 7/15 c/b CHB with jxn escape >> LHC: OM1 60-70, pLAD 30-40, m-d RCA occluded, EF 50-55 >> PCI: 3 x 33 mm Xience DES to RCA  //  c. LHC 8/17: dLM 20, pLAD 20, pLAD stent ok, pRI 30, pRCA stent ok with 30% ISR  . Colon polyps   . History of echocardiogram    Echo 12/15:  Mild LVH, EF 60-65%, normal wall motion, grade 1 diastolic dysfunction, LA upper limits of normal, mild RAE  . Hydrocele, left   . Hyperlipidemia    Myalgias with Zocor, Crestor, atorvastatin.   Marland Kitchen Hyperplasia of prostate   . Hypertension   . Hypothyroidism   . Nicotine abuse   . PAF (paroxysmal atrial fibrillation) ([place])    s/p ablation by Dr Roxan Hockey at [facility] in 2012 // Xarelto anticoagulation  //  ILR removed 2/17      Review of Systems     Objective:   Physical Exam Vitals:   09/13/15 1329  BP: 128/76  Pulse: (!) 55  SpO2: 100%  Weight: 164 lb 9.6 oz (74.7 kg)  Height: 5\' 10"  (1.778 m)   RA  Gen: well appearing HENT: OP clear, TM's clear, neck supple PULM: CTA B, normal percussion CV: RRR, no mgr, trace edema GI: BS+, soft, nontender Derm: no cyanosis or rash Psyche: normal mood and affect        Assessment & Plan:   Dyspnea Resolved  Multiple pulmonary nodules He has multiple small pulmonary nodules which are migratory in nature therefore likely inflammatory. The most likely etiology is colonization with atypical mycobacterial disease of which she has no signs or symptoms of active infection. At this time we'll continue with serial monitoring with CT scans.  Plan: Repeat CT chest August 2018  F/u after that visit    Current Outpatient Prescriptions:  .  acetaminophen (TYLENOL) 325 MG tablet, Take 325 mg by mouth at bedtime as needed (pain/sleep)., Disp: , Rfl:  .  Alirocumab (PRALUENT) 75 MG/ML SOPN, Inject 75 mg into the skin every 14 (fourteen) days., Disp: 2 pen, Rfl: 11 .  aspirin EC 81 MG tablet, Take 1 tablet (81 mg total) by mouth daily., Disp: , Rfl:  .  ketoconazole (NIZORAL) 2 % cream, Apply 1 application topically daily as needed for irritation., Disp: 15 g, Rfl: 1 .  MELATONIN PO, Take 2 mg by mouth at bedtime., Disp: , Rfl:  .  Resveratrol 250 MG CAPS, Take 250 mg by mouth daily., Disp: , Rfl:  .  rivaroxaban (XARELTO) 20 MG TABS tablet, TAKE 1 TABLET BY MOUTH EVERY DAY  WITH SUPPER, Disp: 90 tablet, Rfl: 2

## 2015-10-17 ENCOUNTER — Telehealth: Payer: Self-pay | Admitting: Cardiology

## 2015-10-17 NOTE — Telephone Encounter (Signed)
New Message  Pt c/o medication issue:  1. Name of Medication: Xarelto  2. How are you currently taking this medication (dosage and times per day)? 20mg    3. Are you having a reaction (difficulty breathing--STAT)? Per pt states his stomach has been upset  4. What is your medication issue? Per pt states he stop taking Xarelto after getting an upsetting stomach. Pt states his stomach is still bothering him. Pt wants to know if it was okay for him to stop taking the med. Please call back to discuss

## 2015-10-17 NOTE — Telephone Encounter (Signed)
Pt states on Sunday he had vomiting and diarrhea.

## 2015-10-17 NOTE — Telephone Encounter (Signed)
Pt states he is not sure if he had a virus or something he ate. Pt states he missed Xarelto Sunday, Monday and Tuesday because of GI symptoms. Pt states he is no longer having diarrhea or vomiting.  Pt states he is feeling better, able to eat peanut butter sandwich this morning and is trying to stay hydrated. Pt advised to take Xarelto this evening with some bland food. Pt advised to call back if he is unable to resume Xarelto because of GI symptoms.  I will forward to Dr Aundra Dubin for review.

## 2015-10-19 ENCOUNTER — Ambulatory Visit: Payer: Medicare Other

## 2015-10-19 ENCOUNTER — Ambulatory Visit (INDEPENDENT_AMBULATORY_CARE_PROVIDER_SITE_OTHER): Payer: Medicare Other

## 2015-10-19 ENCOUNTER — Telehealth: Payer: Self-pay | Admitting: Student

## 2015-10-19 DIAGNOSIS — R001 Bradycardia, unspecified: Secondary | ICD-10-CM | POA: Diagnosis not present

## 2015-10-19 NOTE — Telephone Encounter (Signed)
  Received call from [facility] that the patient is in rate-controlled atrial fibrillation, with HR in 50's - 60's. Asymptomatic at this time. Already on Xarelto.   Will make ordering provider aware. [facility] will notify us if HR becomes significantly elevated or pauses noted.  Signed, Erma Heritage, PA-C 10/19/2015, 5:54 PM Pager: [phone]

## 2015-10-19 NOTE — Telephone Encounter (Signed)
Reviewed with Dr Aundra Dubin

## 2015-10-23 ENCOUNTER — Telehealth: Payer: Self-pay | Admitting: Physician Assistant

## 2015-10-23 NOTE — Telephone Encounter (Signed)
I spoke to [employer]. And advised them to use PAF (I48.0) for the monitor dx code.

## 2015-10-23 NOTE — Telephone Encounter (Signed)
New message    They Need cardiac diagnosis from the monitor

## 2015-10-26 ENCOUNTER — Encounter: Payer: Self-pay | Admitting: Physician Assistant

## 2015-10-26 ENCOUNTER — Ambulatory Visit (INDEPENDENT_AMBULATORY_CARE_PROVIDER_SITE_OTHER): Payer: Medicare Other | Admitting: Physician Assistant

## 2015-10-26 VITALS — BP 122/70 | HR 60 | Ht 70.0 in | Wt 161.0 lb

## 2015-10-26 DIAGNOSIS — I2510 Atherosclerotic heart disease of native coronary artery without angina pectoris: Secondary | ICD-10-CM

## 2015-10-26 DIAGNOSIS — I480 Paroxysmal atrial fibrillation: Secondary | ICD-10-CM

## 2015-10-26 DIAGNOSIS — E7800 Pure hypercholesterolemia, unspecified: Secondary | ICD-10-CM

## 2015-10-26 DIAGNOSIS — R001 Bradycardia, unspecified: Secondary | ICD-10-CM | POA: Diagnosis not present

## 2015-10-26 NOTE — Patient Instructions (Addendum)
Medication Instructions:  Your physician recommends that you continue on your current medications as directed. Please refer to the Current Medication list given to you today.   Labwork: None  Testing/Procedures: Your physician has requested that you have an exercise tolerance test. For further information please visit [email or web address]. Please also follow instruction sheet, as given.  Follow-Up: Keep your appointment with Dr. Aundra Dubin 11/14.  Any Other Special Instructions Will Be Listed Below (If Applicable).  If you need a refill on your cardiac medications before your next appointment, please call your pharmacy.

## 2015-10-26 NOTE — Progress Notes (Signed)
Cardiology Office Note:    Date:  10/26/2015   ID:  Dustin Bauer, DOB 03/03/1945, MRN [id number]  PCP:  Redge Gainer, MD  Cardiologist:Dr. Loralie Champagne Electrophysiologist: Dr. [name] VVS: Dr. Trula Slade  Referring MD: Chipper Herb, MD   Chief Complaint  Patient presents with  . Follow-up    AFib    History of Present Illness:    Dustin Bauer is a 70 y.o. male with a hx of CAD, PAF, AAA. He had initial PCI to the LAD in 2011 for stable angina. He is s/p ablation at [facility] by Dr Roxan Hockey in 2012 with ILR implant after procedure. In 7/15, he passed out while in the gym. He was found to have complete heart block in the setting of an inferoposterior STEMI. He had temporary transvenous pacemaker placed and had Xience DES to totally occluded RCA. EF was 50-55% by LV-gram. After PCI, heart block resolved.  His ILR was removed in 2/17.  He is followed by Dr. Trula Slade for 3.9 cm AAA.   I saw him 08/13/15 with c/o gradually worsening DOE and associated lightheadedness reminiscent of his prior angina.  LHC 08/17/15 demonstrated patent proximal LAD and RCA stents.  He was bradycardic with HR in the 40s during the cath.  Recommendation was to proceed with an event monitor and an ETT to assess for chronotropic incompetence.    I saw him in follow-up 09/10/15. I set him up for an exercise treadmill test as well as an event monitor. He apparently showed up last week for his exercise treadmill test (no clear documentation in the chart) and was in atrial fibrillation. He tells me that, prior to coming in that day, he had been sick with symptoms of gastritis. He had significant vomiting and diarrhea. He actually missed Xarelto for 2 days.  His ETT was cancelled and this appointment today was apparently scheduled at that time. His event monitor has demonstrated normal sinus rhythm and sinus bradycardia with atrial fibrillation noted on 10/19/15 (date of cancelled stress test). Heart rates  have been 60s to 100s in AFib. The patient tells me that he has been back on Xarelto since 10/17/15. He does note some dyspnea with exertion. However, this is better over the last couple days. He feels much better this week. He denies chest pain. Denies syncope. Denies orthopnea, PND or edema.  Denies any bleeding issues.    Prior CV studies that were reviewed today include:    LHC 08/17/15 LM 20% distal LM stenosis.  LAD 20% proximal LAD stenosis. Proximal LAD stent was patent with minimal in-stent restenosis.  RI 30% proximal stenosis.  LCx  Relatively small vessel with luminal irregularities.  RCA  Patent proximal RCA stent with about 30% in-stent restenosis. Preserved LV EF.   Echo 12/15 Mild LVH, EF 60-65%, normal wall motion, grade 1 diastolic dysfunction, LA upper limits of normal, mild RAE  LHC 7/15 LV showed inferior mid and basal wall hypokinesia, EF of 50-55% LMpatent.  LAD has 30-40% proximal stenosis before the stent, stent patent.  OM1 60-70% smooth proximal stenosis.  RCA 100% occluded beyond the proximal portion with TIMI 0 flow. PCI: 3.0 x 33 mm Xience Alpine DES to RCA   Past Medical History:  Diagnosis Date  . Allergy to ACE inhibitors    Cough and fatigue with ACEI  . CAD (coronary artery disease)    a. LHC in 2011 for stable angina >> DES to pLAD [facility])  //  b. inf-post  STEMI 7/15 c/b CHB with jxn escape >> LHC: OM1 60-70, pLAD 30-40, m-d RCA occluded, EF 50-55 >> PCI: 3 x 33 mm Xience DES to RCA  //  c. LHC 8/17: dLM 20, pLAD 20, pLAD stent ok, pRI 30, pRCA stent ok with 30% ISR  . Colon polyps   . History of echocardiogram    Echo 12/15:  Mild LVH, EF 60-65%, normal wall motion, grade 1 diastolic dysfunction, LA upper limits of normal, mild RAE  . Hydrocele, left   . Hyperlipidemia    Myalgias with Zocor, Crestor, atorvastatin.   Marland Kitchen Hyperplasia of prostate   . Hypertension   . Hypothyroidism   . Nicotine abuse   . PAF (paroxysmal atrial  fibrillation) [facility])    s/p ablation by Dr Roxan Hockey at [facility] in 2012 // Xarelto anticoagulation  //  ILR removed 2/17    Past Surgical History:  Procedure Laterality Date  . ATRIAL FIBRILLATION ABLATION  07/2010   Afib ablation by Dr Roxan Hockey at [facility] in 2012  . CARDIAC CATHETERIZATION    . CARDIAC CATHETERIZATION N/A 08/17/2015   Procedure: Left Heart Cath and Coronary Angiography;  Surgeon: Larey Dresser, MD;  Location: [place] CV LAB;  Service: Cardiovascular;  Laterality: N/A;  . CORONARY STENT PLACEMENT    . EP IMPLANTABLE DEVICE N/A 03/09/2015   Procedure: Loop Recorder Removal;  Surgeon: [name], MD;  Location: [place] CV LAB;  Service: Cardiovascular;  Laterality: N/A;  . implantable loop recorder implantation  08/13/2010   MDT Reveal XT implanted by Dr Roxan Hockey for afib management post ablation  . LEFT HEART CATH N/A 07/15/2013   Procedure: LEFT HEART CATH;  Surgeon: Clent Demark, MD;  Location: [facility] CATH LAB;  Service: Cardiovascular;  Laterality: N/A;  . TONSILLECTOMY AND ADENOIDECTOMY      Current Medications: Current Meds  Medication Sig  . acetaminophen (TYLENOL) 325 MG tablet Take 325 mg by mouth at bedtime as needed (pain/sleep).  . Alirocumab (PRALUENT) 75 MG/ML SOPN Inject 75 mg into the skin every 14 (fourteen) days.  Marland Kitchen aspirin EC 81 MG tablet Take 1 tablet (81 mg total) by mouth daily.  Marland Kitchen ketoconazole (NIZORAL) 2 % cream Apply 1 application topically daily as needed for irritation.  Marland Kitchen MELATONIN PO Take 2 mg by mouth at bedtime.  Marland Kitchen Resveratrol 250 MG CAPS Take 250 mg by mouth daily.  . rivaroxaban (XARELTO) 20 MG TABS tablet TAKE 1 TABLET BY MOUTH EVERY DAY WITH SUPPER     Allergies:   Clopidogrel; Crestor [rosuvastatin calcium]; Lipitor [atorvastatin calcium]; and Statins   Social History   Social History  . Marital status: Married    Spouse name: N/A  . Number of children: N/A  . Years of education: N/A   Social History Main Topics  .  Smoking status: Former Smoker    Packs/day: 0.25    Years: 30.00    Types: Cigarettes    Quit date: 07/21/2003  . Smokeless tobacco: Never Used  . Alcohol use 0.6 oz/week    1 Glasses of wine per week     Comment: occasional wine  . Drug use: No  . Sexual activity: Yes   Other Topics Concern  . None   Social History Narrative   Pt lives in [place] with wife.  Retired from [employer] of [employer].  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     Family History:  The patient's family history includes Dementia in his mother; Heart  disease in his father; Hyperlipidemia in his father.   ROS:   Please see the history of present illness.    ROS All other systems reviewed and are negative.   EKGs/Labs/Other Test Reviewed:    EKG:  EKG is  ordered today.  The ekg ordered today demonstrates sinus brady, HR 51, normal axis, TWI V1-4, no change from 09/10/15 ECG  Recent Labs: 06/04/2015: ALT 14; TSH 1.650 08/13/2015: BUN 18; Creat 1.13; Hemoglobin 15.3; Platelets 229; Potassium 4.7; Sodium 139   Recent Lipid Panel    Component Value Date/Time   CHOL 123 06/04/2015 1501   CHOL 100 (L) 12/28/2014 1609   TRIG 95 06/04/2015 1501   HDL 55 06/04/2015 1501   CHOLHDL 1.9 12/28/2014 1609   CHOLHDL 3 06/01/2014 1030   VLDL 12.2 06/01/2014 1030   LDLCALC 33 12/28/2014 1609     Physical Exam:    VS:  BP 122/70   Pulse 60   Ht 5\' 10"  (1.778 m)   Wt 161 lb (73 kg)   SpO2 98%   BMI 23.10 kg/m     Wt Readings from Last 3 Encounters:  10/26/15 161 lb (73 kg)  09/13/15 164 lb 9.6 oz (74.7 kg)  09/10/15 165 lb (74.8 kg)     Physical Exam  Constitutional: He is oriented to person, place, and time. He appears well-developed and well-nourished. No distress.  HENT:  Head: Normocephalic and atraumatic.  Eyes: No scleral icterus.  Neck: No JVD present.  Cardiovascular: Normal rate, regular rhythm and normal heart sounds.   No murmur heard. Pulmonary/Chest: Effort normal.  He has no wheezes. He has no rales.  Abdominal: Soft. There is no tenderness.  Musculoskeletal: He exhibits no edema.  Neurological: He is alert and oriented to person, place, and time.  Skin: Skin is warm and dry.  Psychiatric: He has a normal mood and affect.    ASSESSMENT:    1. Paroxysmal atrial fibrillation (HCC)   2. Bradycardia   3. Coronary artery disease involving native coronary artery of native heart without angina pectoris   4. Pure hypercholesterolemia    PLAN:    In order of problems listed above:  1. PAF -  He has a prior hx of ablation.  He has documented AFib last week at the time of illness with gastroenteritis.  He is back in NSR today.  I suspect his AF was brought on by physical illness.  However, his symptoms that prompted his LHC several mos ago may have been recurrent AF. He will complete his monitor.  Continue Xarelto.  CHADS2-VASc=3.  If he has recurrent AF, refer back to Dr. [name].  2. Bradycardia - Question if symptoms of dizziness related to bradycardia.  However, no specific symptoms noted on his monitor so far related to bradycardia.  He did c/o dizziness when he was in AF on 10/19/15.    -  Complete event monitor  -  Reschedule ETT to r/o chronotropic incompetence.   3. CAD - s/p inferoposterior MI in 7/15 c/b complete heart block.  CHB resolved with DES to RCA.  Recent LHC with patent LAD and RCA stents.  Continue current medical regimen with Alirocumab, ASA.    4. Hyperlipidemia:  He remains on Praluent.  LDL in 9/16 was 39.     Medication Adjustments/Labs and Tests Ordered: Current medicines are reviewed at length with the patient today.  Concerns regarding medicines are outlined above.  Medication changes, Labs and Tests ordered today are  outlined in the Patient Instructions noted below. Patient Instructions  Medication Instructions:  Your physician recommends that you continue on your current medications as directed. Please refer to the  Current Medication list given to you today.   Labwork: None  Testing/Procedures: Your physician has requested that you have an exercise tolerance test. For further information please visit [email or web address]. Please also follow instruction sheet, as given.  Follow-Up: Keep your appointment with Dr. Aundra Dubin 11/14.  Any Other Special Instructions Will Be Listed Below (If Applicable).  If you need a refill on your cardiac medications before your next appointment, please call your pharmacy.  Signed, Richardson Dopp, PA-C  10/26/2015 1:36 PM    [facility] [place], [place], [place]  [address] Phone: [phone]; Fax: [phone]

## 2015-10-31 ENCOUNTER — Ambulatory Visit (INDEPENDENT_AMBULATORY_CARE_PROVIDER_SITE_OTHER): Payer: Medicare Other

## 2015-10-31 DIAGNOSIS — R001 Bradycardia, unspecified: Secondary | ICD-10-CM

## 2015-10-31 LAB — EXERCISE TOLERANCE TEST
CHL CUP MPHR: 150 {beats}/min
CHL CUP RESTING HR STRESS: 61 {beats}/min
Estimated workload: 10.2 METS
Exercise duration (min): 8 min
Exercise duration (sec): 0 s
Peak HR: 134 {beats}/min
Percent HR: 89 %
RPE: 15

## 2015-11-02 NOTE — Addendum Note (Signed)
Addended by: Michae Kava on: 11/02/2015 01:20 PM   Modules accepted: Orders

## 2015-11-13 ENCOUNTER — Encounter: Payer: Self-pay | Admitting: Cardiology

## 2015-11-19 NOTE — Addendum Note (Signed)
Addended by: Sandrea Hammond D on: 11/19/2015 05:57 PM   Modules accepted: Orders

## 2015-11-23 NOTE — Telephone Encounter (Signed)
Sputum cup placed up front to pick up 08/20/15. This was never picked up.  This has been put back on the shelf. Nothing further needed.

## 2015-11-26 ENCOUNTER — Telehealth: Payer: Self-pay | Admitting: *Deleted

## 2015-11-26 ENCOUNTER — Encounter: Payer: Self-pay | Admitting: Physician Assistant

## 2015-11-26 NOTE — Telephone Encounter (Signed)
Pt aware of monitor results with findings of <10% a-fib and rate was controlled while in atrial fibrillation. Pt advised to continue on current Tx plan. Pt advised to keep appt with Dr. Aundra Dubin 11/14. Pt agreeable to plan of care. Pt did ask since monitor showed <10 % a-fib does this mean the dr won't proceed with an ablation or pacemaker. I advised pt this is something to d/w Dr. Aundra Dubin at appt tomorrow. Pt agreed.

## 2015-11-27 ENCOUNTER — Encounter: Payer: Self-pay | Admitting: Cardiology

## 2015-11-27 ENCOUNTER — Ambulatory Visit (INDEPENDENT_AMBULATORY_CARE_PROVIDER_SITE_OTHER): Payer: Medicare Other | Admitting: Cardiology

## 2015-11-27 VITALS — BP 130/86 | HR 57 | Ht 70.0 in | Wt 163.4 lb

## 2015-11-27 DIAGNOSIS — I25119 Atherosclerotic heart disease of native coronary artery with unspecified angina pectoris: Secondary | ICD-10-CM

## 2015-11-27 DIAGNOSIS — E785 Hyperlipidemia, unspecified: Secondary | ICD-10-CM

## 2015-11-27 DIAGNOSIS — R001 Bradycardia, unspecified: Secondary | ICD-10-CM

## 2015-11-27 DIAGNOSIS — I480 Paroxysmal atrial fibrillation: Secondary | ICD-10-CM

## 2015-11-27 LAB — CBC WITH DIFFERENTIAL/PLATELET
Basophils Absolute: 85 cells/uL (ref 0–200)
Basophils Relative: 1 %
EOS ABS: 425 {cells}/uL (ref 15–500)
Eosinophils Relative: 5 %
HEMATOCRIT: 45 % (ref 38.5–50.0)
Hemoglobin: 14.6 g/dL (ref 13.2–17.1)
LYMPHS PCT: 32 %
Lymphs Abs: 2720 cells/uL (ref 850–3900)
MCH: 31.1 pg (ref 27.0–33.0)
MCHC: 32.4 g/dL (ref 32.0–36.0)
MCV: 95.9 fL (ref 80.0–100.0)
MONO ABS: 680 {cells}/uL (ref 200–950)
MONOS PCT: 8 %
MPV: 9.8 fL (ref 7.5–12.5)
NEUTROS PCT: 54 %
Neutro Abs: 4590 cells/uL (ref 1500–7800)
PLATELETS: 214 10*3/uL (ref 140–400)
RBC: 4.69 MIL/uL (ref 4.20–5.80)
RDW: 14.6 % (ref 11.0–15.0)
WBC: 8.5 10*3/uL (ref 3.8–10.8)

## 2015-11-27 LAB — LIPID PANEL
Cholesterol: 120 mg/dL (ref <200)
HDL: 55 mg/dL (ref >40)
LDL CALC: 48 mg/dL (ref <100)
Total CHOL/HDL Ratio: 2.2 Ratio (ref <5.0)
Triglycerides: 83 mg/dL (ref <150)
VLDL: 17 mg/dL (ref <30)

## 2015-11-27 LAB — BASIC METABOLIC PANEL
BUN: 19 mg/dL (ref 7–25)
CHLORIDE: 107 mmol/L (ref 98–110)
CO2: 27 mmol/L (ref 20–31)
Calcium: 9.4 mg/dL (ref 8.6–10.3)
Creat: 1.01 mg/dL (ref 0.70–1.18)
GLUCOSE: 87 mg/dL (ref 65–99)
POTASSIUM: 4.6 mmol/L (ref 3.5–5.3)
SODIUM: 140 mmol/L (ref 135–146)

## 2015-11-27 NOTE — Patient Instructions (Signed)
Medication Instructions:  Your physician recommends that you continue on your current medications as directed. Please refer to the Current Medication list given to you today.  Labwork: Lipid profile/CBCd/BMET today  Testing/Procedures: None   Follow-Up: Your physician wants you to follow-up in: 6 months with Roderic Palau, NP in the [facility].You will receive a reminder letter in the mail two months in advance. If you don't receive a letter, please call our office to schedule the follow-up appointment.   Your physician wants you to follow-up in: 1 year with Dr Aundra Dubin in the [facility]. .(November 2018).  You will receive a reminder letter in the mail two months in advance. If you don't receive a letter, please call our office to schedule the follow-up appointment.        If you need a refill on your cardiac medications before your next appointment, please call your pharmacy.

## 2015-11-28 NOTE — Progress Notes (Signed)
Patient ID: Dustin Bauer, male   DOB: December 31, 1945, 70 y.o.   MRN: [id number] PCP: Dr. Laurance Flatten  70 yo with history of CAD and paroxysmal atrial fibrillation presents for cardiology evaluation.  He had initial PCI to LAD in 2011 for stable angina.  He developed paroxysmal atrial fibrillation and had ablation at [facility] several years ago. He was initially anticoagulated but has been off anticoagulation for several years.  He has a Medtronic Reveal ILR.  In 7/15, he passed out while in the gym.  He was found to have complete heart block in the setting of an inferoposterior STEMI.  He had temporary transvenous pacemaker placed and had Xience DES to totally occluded RCA.  EF was 50-55% by LV-gram.  After PCI, heart block resolved.   In 8/17, he had repeat LHC showing patent LAD and RCA stents.  HR was noted to be down to the 40s during cath.  He had a 30 day monitor in 10/17.  This did not show significant bradycardia, but < 10% atrial fibrillation was noted (he was reasonably rate-controlled in atrial fibrillation).  ETT was done in 10/17, showing normal chronotropic response.   Patient is now doing well.  He does not feel atrial fibrillation when he is in it.  He is in NSR today with PACs.  Mild dyspnea with steep hills.  He was able to walk up [place] last weekend. No chest pain.  No palpitations, no lightheadedness.                                                                                                                                     Labs (7/15): K 4.1, creatinine 0.95 Labs (12/15): K 4.2, creatinine 1.0, LDL 142, HDL 38 Labs (5/16): K 4.6, creatinine 0.96 Labs (9/16): LDL 39, HDL 60 Labs (5/17): LDL-P 659, LDL 49 Labs (7/17): K 4.7, creatinine 1.13  ECG: NSR, 1st degree AV block, inferior T wave flattening  PMH: 1. CAD: He had LHC in 2011 for chronic stable angina, at that time had PCI with DES to proximal LAD [facility]).  In 7/15, he presented with syncope and inferoposterior  STEMI.  He was found to have complete heart block with junctional escape.  LHC showed 60-70% OM1 stenosis, 30-40% proximal LAD, total occlusion of the mid to distal LAD with EF 50-55% by LV-gram.  He had temporary transvenous PCM placement and Xience DES to the RCA.  Echo (12/15) with EF 60-65%, mild LVH, mildly dilated RV with normal systolic function.  - LHC (8/17): Patent LAD and RCA stents.  2. Hyperlipidemia: Myalgias with Zocor, Crestor, atorvastatin.   3. Atrial fibrillation: s/p ablation at [facility] several years ago.  He was on coumadin then Xarelto.  Anticoagulation was stopped about 2 years ago.  He has a Medtronic Reveal monitor in place that has shown recurrent atrial fibrillation.  - 30 day monitor 10/17 showed <  10% atrial fibrillation.  4. Colon polyps 5. Cough and fatigue with ACEI 6. Bradycardia: ETT in 10/17 showed normal chronotropic response.  30 day monitor in 10/17 also showed no significant bradyarrhythmias.  7. Multiple pulmonary nodules: ?Atypical mycobacteria.  SH: Married, retired, lives in [place], 2 children. Quit smoking in 2002.   FH: Father with MI at 11, uncles with MIs in their 37s.  Grandfather with MI in his 36s.   ROS: All systems reviewed and negative except as per HPI.   Current Outpatient Prescriptions  Medication Sig Dispense Refill  . acetaminophen (TYLENOL) 325 MG tablet Take 325 mg by mouth at bedtime as needed (pain/sleep).    . Alirocumab (PRALUENT) 75 MG/ML SOPN Inject 75 mg into the skin every 14 (fourteen) days. 2 pen 11  . ketoconazole (NIZORAL) 2 % cream Apply 1 application topically daily as needed for irritation. 15 g 1  . MELATONIN PO Take 2 mg by mouth at bedtime.    . naproxen sodium (ANAPROX) 220 MG tablet Take 220 mg by mouth 2 (two) times daily as needed (pain).    . Resveratrol 250 MG CAPS Take 250 mg by mouth daily.    . rivaroxaban (XARELTO) 20 MG TABS tablet TAKE 1 TABLET BY MOUTH EVERY DAY WITH SUPPER 90 tablet 2   No  current facility-administered medications for this visit.    BP 130/86   Pulse (!) 57   Ht 5\' 10"  (1.778 m)   Wt 163 lb 6.4 oz (74.1 kg)   BMI 23.45 kg/m  General: NAD Neck: No JVD, no thyromegaly or thyroid nodule.  Lungs: Clear to auscultation bilaterally with normal respiratory effort. CV: Nondisplaced PMI.  Heart regular S1/S2, no S3/S4, no murmur.  No peripheral edema.  No carotid bruit.  Normal pedal pulses.  Abdomen: Soft, nontender, no hepatosplenomegaly, no distention.  Skin: Intact without lesions or rashes.  Neurologic: Alert and oriented x 3.  Psych: Normal affect. Extremities: No clubbing or cyanosis.  HEENT: Normal.   Assessment/Plan: 1. CAD: Stable s/p inferoposterior MI in 7/15.  EF 50-55% by LV-gram.  Echo in 12/15 with EF 60-65%.  LHC in 8/17 with patent LAD and RCA stents.  No chest pain or significant exertional dyspnea.  - He is on Xarelto now with stable CAD does not need ASA.  - Did not tolerate ACEI or ARB with cough and fatigue.  Avoid beta blocker with bradycardia.   2. Bradycardia: Noted at time of cath in 8/17, HR down to around 40 at times.  However 10/17 30 day monitor showed no bradyarrhythmias.  ETT in 10/17 also showed normal chronotropic response.  3. Hyperlipidemia: Good lipids on Praluent in 5/17.  Recheck today.   4. Atrial fibrillation: Paroxysmal.  S/p ablation.  Reveal ILR has shown brief runs of atrial fibrillation before it was removed, overall low burden. 30 day monitor in 10/17 showed < 10% atrial fibrillation, rate-controlled.  CHADSVASC = 3.    - Continue Xarelto.  - Check CBC.   Followup in 6 months.   Loralie Champagne 11/28/15

## 2015-12-04 ENCOUNTER — Encounter: Payer: Self-pay | Admitting: Family Medicine

## 2015-12-04 ENCOUNTER — Ambulatory Visit (INDEPENDENT_AMBULATORY_CARE_PROVIDER_SITE_OTHER): Payer: Medicare Other | Admitting: Family Medicine

## 2015-12-04 VITALS — BP 126/68 | HR 52 | Temp 97.1°F | Ht 70.0 in | Wt 164.0 lb

## 2015-12-04 DIAGNOSIS — R718 Other abnormality of red blood cells: Secondary | ICD-10-CM

## 2015-12-04 DIAGNOSIS — R972 Elevated prostate specific antigen [PSA]: Secondary | ICD-10-CM

## 2015-12-04 DIAGNOSIS — N433 Hydrocele, unspecified: Secondary | ICD-10-CM

## 2015-12-04 DIAGNOSIS — L570 Actinic keratosis: Secondary | ICD-10-CM

## 2015-12-04 DIAGNOSIS — I2510 Atherosclerotic heart disease of native coronary artery without angina pectoris: Secondary | ICD-10-CM

## 2015-12-04 DIAGNOSIS — E7800 Pure hypercholesterolemia, unspecified: Secondary | ICD-10-CM

## 2015-12-04 DIAGNOSIS — Z23 Encounter for immunization: Secondary | ICD-10-CM | POA: Diagnosis not present

## 2015-12-04 DIAGNOSIS — Z0000 Encounter for general adult medical examination without abnormal findings: Secondary | ICD-10-CM | POA: Diagnosis not present

## 2015-12-04 DIAGNOSIS — I480 Paroxysmal atrial fibrillation: Secondary | ICD-10-CM

## 2015-12-04 DIAGNOSIS — E559 Vitamin D deficiency, unspecified: Secondary | ICD-10-CM

## 2015-12-04 DIAGNOSIS — I10 Essential (primary) hypertension: Secondary | ICD-10-CM

## 2015-12-04 NOTE — Patient Instructions (Addendum)
Medicare Annual Wellness Visit  [facility] and the medical providers at [facility] strive to bring you the best medical care.  In doing so we not only want to address your current medical conditions and concerns but also to detect new conditions early and prevent illness, disease and health-related problems.    Medicare offers a yearly Wellness Visit which allows our clinical staff to assess your need for preventative services including immunizations, lifestyle education, counseling to decrease risk of preventable diseases and screening for fall risk and other medical concerns.    This visit is provided free of charge (no copay) for all Medicare recipients. The clinical pharmacists at [facility] have begun to conduct these Wellness Visits which will also include a thorough review of all your medications.    As you primary medical provider recommend that you make an appointment for your Annual Wellness Visit if you have not done so already this year.  You may set up this appointment before you leave today or you may call back [id number] and schedule an appointment.  Please make sure when you call that you mention that you are scheduling your Annual Wellness Visit with the clinical pharmacist so that the appointment may be made for the proper length of time.     Continue current medications. Continue good therapeutic lifestyle changes which include good diet and exercise. Fall precautions discussed with patient. If an FOBT was given today- please return it to our front desk. If you are over 63 years old - you may need Prevnar 42 or the adult Pneumonia vaccine.  **Flu shots are available--- please call and schedule a FLU-CLINIC appointment**  After your visit with [place] today you will receive a survey in the mail or online from [employer] regarding your care with [place]. Please take a moment to fill this out. Your feedback is very  important to [place] as you can help [place] better understand your patient needs as well as improve your experience and satisfaction. WE CARE ABOUT YOU!!!   Continue to follow-up with cardiology, pulmonology, and vascular surgery.

## 2015-12-04 NOTE — Progress Notes (Signed)
Subjective:    Patient ID: Dustin Bauer, male    DOB: Aug 23, 1945, 70 y.o.   MRN: [id number]  HPI Pt here for annual CPE and management of chronic medical problems which includes hyperlipidemia and a fib. He is taking medications regularly.The patient has recently had a stress test with good performance on the stress test. He has a history of bradycardia. He has atrial fibrillation that is paroxysmal in nature and the patient is not aware of when he has this. He has a follow-up appointment with the nurse practitioner with the cardiologist in 6 months and a follow-up visit with the cardiologist in one year. He is been active physically and feeling well. He also has follow-up visit scheduled with the vascular surgeon because of a 3.9 cm aortic aneurysm. He also has a follow-up with a pulmonologist because of multiple pulmonary nodules and they're going to follow this on a serial basis currently because the nodules are so small. It is of significance to note that the patient lost his sister recently with pancreatic cancer. She was younger than the patient. He did have the CT scan done in his pancreas looks normal. He is due to get a rectal exam today and his had his lab work done with cardiologist and we will give him a copy that and have reviewed that. We also reviewed the note from the cardiologist recently. His extensive history was revealed with him today and he is on top of that. He denies any chest pain pressure or palpitations. He denies any trouble with shortness of breath other than what he would expect. He has only occasional heartburn and this is not often. His stools are normal color and there is no blood that he is seen. He denies any problems with nausea vomiting or diarrhea. He's passing his water as he would expect for his age he has some dribbling sometimes some nocturia but no specific issues with passing his water.     Patient Active Problem List   Diagnosis Date Noted  . History of  heart block 08/13/2015  . Dyspnea 06/13/2015  . Multiple pulmonary nodules 06/13/2015  . Hypertensive cardiovascular disease 04/17/2014  . CAD (coronary artery disease) 12/16/2013  . History of acute inferior wall myocardial infarction 07/15/2013  . Abnormal chest CT 05/30/2013  . Elevated PSA 05/30/2013  . Hyperlipidemia 06/24/2012  . Paroxysmal atrial fibrillation ([place]) 06/24/2012  . Statin intolerance 06/24/2012   Outpatient Encounter Prescriptions as of 12/04/2015  Medication Sig  . acetaminophen (TYLENOL) 325 MG tablet Take 325 mg by mouth at bedtime as needed (pain/sleep).  . Alirocumab (PRALUENT) 75 MG/ML SOPN Inject 75 mg into the skin every 14 (fourteen) days.  Marland Kitchen ketoconazole (NIZORAL) 2 % cream Apply 1 application topically daily as needed for irritation.  Marland Kitchen MELATONIN PO Take 2 mg by mouth at bedtime.  . naproxen sodium (ANAPROX) 220 MG tablet Take 220 mg by mouth 2 (two) times daily as needed (pain).  . Resveratrol 250 MG CAPS Take 250 mg by mouth daily.  . rivaroxaban (XARELTO) 20 MG TABS tablet TAKE 1 TABLET BY MOUTH EVERY DAY WITH SUPPER   No facility-administered encounter medications on file as of 12/04/2015.       Review of Systems  Constitutional: Negative.   HENT: Negative.   Eyes: Negative.   Respiratory: Negative.   Cardiovascular: Negative.   Gastrointestinal: Negative.   Endocrine: Negative.   Genitourinary: Negative.   Musculoskeletal: Negative.   Skin: Negative.   Allergic/Immunologic: Negative.  Neurological: Negative.   Hematological: Negative.   Psychiatric/Behavioral: Negative.        Objective:   Physical Exam  Constitutional: He is oriented to person, place, and time. He appears well-developed and well-nourished.  HENT:  Head: Normocephalic and atraumatic.  Right Ear: External ear normal.  Left Ear: External ear normal.  Mouth/Throat: Oropharynx is clear and moist. No oropharyngeal exudate.  Nasal congestion bilaterally  Eyes:  Conjunctivae and EOM are normal. Pupils are equal, round, and reactive to light. Right eye exhibits no discharge. Left eye exhibits no discharge. No scleral icterus.  Neck: Normal range of motion. Neck supple. No thyromegaly present.  No bruits thyromegaly or anterior cervical adenopathy  Cardiovascular: Normal rate, regular rhythm, normal heart sounds and intact distal pulses.   No murmur heard. Heart had a rate of about 56-60/m with no atrial fib  Pulmonary/Chest: Effort normal and breath sounds normal. No respiratory distress. He has no wheezes. He has no rales. He exhibits no tenderness.  Clear anteriorly and posteriorly and no axillary adenopathy  Abdominal: Soft. Bowel sounds are normal. He exhibits no mass. There is no tenderness. There is no rebound and no guarding.  No abdominal tenderness masses or organ enlargement or bruits. No inguinal adenopathy.  Genitourinary: Rectum normal, prostate normal and penis normal.  Genitourinary Comments: He has a large hydrocele on the left. There were no hernias palpated. The rectal exam was otherwise normal. The prostate is enlarged but soft. He has seen the urologist in the past and this was cause of an elevated PSA and the urologist said no further follow-ups were necessary.  Musculoskeletal: Normal range of motion. He exhibits no edema.  Lymphadenopathy:    He has no cervical adenopathy.  Neurological: He is alert and oriented to person, place, and time. He has normal reflexes. No cranial nerve deficit.  Skin: Skin is warm and dry. No rash noted.  Psychiatric: He has a normal mood and affect. His behavior is normal. Judgment and thought content normal.  Nursing note and vitals reviewed.   BP 126/68 (BP Location: Left Wrist)   Pulse (!) 52   Temp 97.1 F (36.2 C) (Oral)   Ht 5\' 10"  (1.778 m)   Wt 164 lb (74.4 kg)   BMI 23.53 kg/m       Assessment & Plan:  1. Essential hypertension -The blood pressure is good today he will continue  with current treatment  2. Pure hypercholesterolemia -Continue with PCS K-9 inhibitor therapy as he is getting excellent results with an LDL C that is good and at goal.  3. Vitamin D deficiency -Continue current treatment  4. ASCVD (arteriosclerotic cardiovascular disease) -Continue follow-up with cardiology  5. Paroxysmal atrial fibrillation (HCC) -The patient appears to be in normal sinus rhythm today with a rate of about 60/m  6. Elevated PSA -This is been evaluated by urology in the past and he was sent back to [place] because this was benign. He does have an enlarged prostate and does have some dribbling symptoms associated with this and nocturia. - PSA, total and free - Urinalysis, Complete  7. Annual physical exam -Continue to follow-up with cardiology  8. Hydrocele in adult -He's having no problems with this.  9. Benign prostatic hyperplasia with lower urinary tract symptoms, symptom details unspecified -The prostate is enlarged and slightly congested and we will check a PSA to make sure that is okay.   10. Actinic keratoses -Cryotherapy to lesions on scalp in front of the left ear  and left hand. The patient tolerated the procedure well.  Patient Instructions                       Medicare Annual Wellness Visit  [facility] and the medical providers at [facility] strive to bring you the best medical care.  In doing so we not only want to address your current medical conditions and concerns but also to detect new conditions early and prevent illness, disease and health-related problems.    Medicare offers a yearly Wellness Visit which allows our clinical staff to assess your need for preventative services including immunizations, lifestyle education, counseling to decrease risk of preventable diseases and screening for fall risk and other medical concerns.    This visit is provided free of charge (no copay) for all Medicare recipients. The clinical  pharmacists at [facility] have begun to conduct these Wellness Visits which will also include a thorough review of all your medications.    As you primary medical provider recommend that you make an appointment for your Annual Wellness Visit if you have not done so already this year.  You may set up this appointment before you leave today or you may call back [phone] and schedule an appointment.  Please make sure when you call that you mention that you are scheduling your Annual Wellness Visit with the clinical pharmacist so that the appointment may be made for the proper length of time.     Continue current medications. Continue good therapeutic lifestyle changes which include good diet and exercise. Fall precautions discussed with patient. If an FOBT was given today- please return it to our front desk. If you are over 44 years old - you may need Prevnar 76 or the adult Pneumonia vaccine.  **Flu shots are available--- please call and schedule a FLU-CLINIC appointment**  After your visit with [place] today you will receive a survey in the mail or online from [employer] regarding your care with [place]. Please take a moment to fill this out. Your feedback is very important to [place] as you can help [place] better understand your patient needs as well as improve your experience and satisfaction. WE CARE ABOUT YOU!!!     Arrie Senate MD

## 2015-12-05 ENCOUNTER — Ambulatory Visit: Payer: Medicare Other | Admitting: Family Medicine

## 2015-12-05 LAB — MICROSCOPIC EXAMINATION: Renal Epithel, UA: NONE SEEN /hpf

## 2015-12-05 LAB — URINALYSIS, COMPLETE
Bilirubin, UA: NEGATIVE
GLUCOSE, UA: NEGATIVE
KETONES UA: NEGATIVE
NITRITE UA: NEGATIVE
Protein, UA: NEGATIVE
Specific Gravity, UA: 1.03 (ref 1.005–1.030)
UUROB: 0.2 mg/dL (ref 0.2–1.0)
pH, UA: 5 (ref 5.0–7.5)

## 2015-12-05 LAB — PSA, TOTAL AND FREE
PSA FREE PCT: 15.7 %
PSA, Free: 0.58 ng/mL
Prostate Specific Ag, Serum: 3.7 ng/mL (ref 0.0–4.0)

## 2015-12-10 NOTE — Addendum Note (Signed)
Addended by: Thana Ates on: 12/10/2015 10:46 AM   Modules accepted: Orders

## 2015-12-18 ENCOUNTER — Other Ambulatory Visit: Payer: Self-pay | Admitting: *Deleted

## 2016-01-17 ENCOUNTER — Other Ambulatory Visit: Payer: Medicare Other

## 2016-01-17 DIAGNOSIS — R972 Elevated prostate specific antigen [PSA]: Secondary | ICD-10-CM

## 2016-01-17 DIAGNOSIS — R718 Other abnormality of red blood cells: Secondary | ICD-10-CM

## 2016-01-17 LAB — MICROSCOPIC EXAMINATION
EPITHELIAL CELLS (NON RENAL): NONE SEEN /HPF (ref 0–10)
Renal Epithel, UA: NONE SEEN /hpf
WBC UA: NONE SEEN /HPF (ref 0–?)

## 2016-01-17 LAB — URINALYSIS, COMPLETE
BILIRUBIN UA: NEGATIVE
Glucose, UA: NEGATIVE
Leukocytes, UA: NEGATIVE
NITRITE UA: NEGATIVE
Protein, UA: NEGATIVE
SPEC GRAV UA: 1.025 (ref 1.005–1.030)
Urobilinogen, Ur: 0.2 mg/dL (ref 0.2–1.0)
pH, UA: 5 (ref 5.0–7.5)

## 2016-01-18 ENCOUNTER — Other Ambulatory Visit: Payer: Self-pay | Admitting: *Deleted

## 2016-01-18 DIAGNOSIS — R319 Hematuria, unspecified: Secondary | ICD-10-CM

## 2016-01-24 ENCOUNTER — Telehealth: Payer: Self-pay | Admitting: Family Medicine

## 2016-01-24 NOTE — Telephone Encounter (Signed)
Can you check on referral to urology

## 2016-01-25 NOTE — Telephone Encounter (Signed)
[facility] with appointment date/time as per DPR  Of 10/26/15   [facility] Dr. Karsten Ro 01/29/16 at [place]

## 2016-02-13 ENCOUNTER — Other Ambulatory Visit: Payer: Self-pay | Admitting: Cardiology

## 2016-02-13 NOTE — Telephone Encounter (Signed)
11/27/2015 office visit with Dr Aundra Dubin 11/27/2015 SrCr 1.01 11/27/2015 Hgb 14.6 11/27/2015 HCT 45.0 12/04/2015 Wt 74.4kg 02/13/2016 CrCl 70.59 Refill done Xarelto 20 mg daily

## 2016-02-15 ENCOUNTER — Inpatient Hospital Stay: Admission: RE | Admit: 2016-02-15 | Payer: Medicare Other | Source: Ambulatory Visit

## 2016-02-19 ENCOUNTER — Ambulatory Visit (INDEPENDENT_AMBULATORY_CARE_PROVIDER_SITE_OTHER)
Admission: RE | Admit: 2016-02-19 | Discharge: 2016-02-19 | Disposition: A | Discharge status: Home / Self Care | Payer: Medicare Other | Source: Ambulatory Visit | Attending: Pulmonary Disease | Admitting: Pulmonary Disease

## 2016-02-19 DIAGNOSIS — R918 Other nonspecific abnormal finding of lung field: Secondary | ICD-10-CM

## 2016-02-25 ENCOUNTER — Telehealth: Payer: Self-pay | Admitting: Pulmonary Disease

## 2016-02-25 NOTE — Progress Notes (Signed)
Call patient to inform him of results; unable to leave message as voicemail is not set up. Will call again later.

## 2016-02-25 NOTE — Telephone Encounter (Signed)
Notes Recorded by Juanito Doom, MD on 02/20/2016 at 8:10 PM EST A Please let him know that once again some nodules have disappeared, others have shown up new. We will need to decide how to handle this in the next visit but at this point there is nothing that is worrisome for a malignancy. Thanks B  Pt aware of results and voiced his understanding. Nothing further needed.

## 2016-04-22 ENCOUNTER — Telehealth: Payer: Self-pay | Admitting: Family Medicine

## 2016-04-22 NOTE — Telephone Encounter (Signed)
Has had issues of diarrhea recently. Is out of town today. appt made for tomorrow

## 2016-04-23 ENCOUNTER — Encounter: Payer: Self-pay | Admitting: Gastroenterology

## 2016-04-23 ENCOUNTER — Encounter: Payer: Self-pay | Admitting: Family Medicine

## 2016-04-23 ENCOUNTER — Ambulatory Visit (INDEPENDENT_AMBULATORY_CARE_PROVIDER_SITE_OTHER): Payer: Medicare Other | Admitting: Family Medicine

## 2016-04-23 VITALS — BP 130/72 | HR 70 | Temp 96.9°F | Ht 70.0 in | Wt 162.0 lb

## 2016-04-23 DIAGNOSIS — I480 Paroxysmal atrial fibrillation: Secondary | ICD-10-CM | POA: Diagnosis not present

## 2016-04-23 DIAGNOSIS — R112 Nausea with vomiting, unspecified: Secondary | ICD-10-CM | POA: Diagnosis not present

## 2016-04-23 DIAGNOSIS — R142 Eructation: Secondary | ICD-10-CM

## 2016-04-23 DIAGNOSIS — Z800 Family history of malignant neoplasm of digestive organs: Secondary | ICD-10-CM

## 2016-04-23 NOTE — Patient Instructions (Addendum)
We will arrange for you to have a visit with the gastroenterologist. He may consider doing an endoscopy on you and he may also want to go ahead and do your colonoscopy since you're going to be do that in June. Take ranitidine 150 mg twice daily the equate brand for breakfast and supper until the visit with the gastroenterologist Reduce alcohol intake and NSAID intake and spicy food intake. Return the FOBT as directed

## 2016-04-23 NOTE — Progress Notes (Signed)
Subjective:    Patient ID: Dustin Bauer, male    DOB: August 30, 1945, 71 y.o.   MRN: [id number]  HPI Pt here today for several episodes of belching, GERD and occasional vomiting.The patient has had about 4 of these episodes over the past 2-3 months. He says there is any issue with belching nausea or heartburn sensation and trouble swallowing but not all the time. He does indicate that he takes occasionally some NSAIDs but mostly Tylenol. He is is on Xarelto because of atrial fibrillation. He is not seeing any blood in the stool or had any black tarry bowel movements. The last episode of this was this past weekend. He is taking sometimes periodically but not regularly. His weight is down 2 pounds since last November on her record. There is a family history of peptic ulcer disease. He denies any chest pain or shortness of breath. He's not having any trouble passing his water. He has not seen any blood or had any black tarry bowel movements. In the history, it is important to note also that the patient's grandmother had colon cancer. The patient's last colonoscopy was done in [place]. It was done on 06/14/2011 and he is due for repeat this June.    Patient Active Problem List   Diagnosis Date Noted  . History of heart block 08/13/2015  . Dyspnea 06/13/2015  . Multiple pulmonary nodules 06/13/2015  . Hypertensive cardiovascular disease 04/17/2014  . CAD (coronary artery disease) 12/16/2013  . History of acute inferior wall myocardial infarction 07/15/2013  . Abnormal chest CT 05/30/2013  . Elevated PSA 05/30/2013  . Hyperlipidemia 06/24/2012  . Paroxysmal atrial fibrillation ([place]) 06/24/2012  . Statin intolerance 06/24/2012   Outpatient Encounter Prescriptions as of 04/23/2016  Medication Sig  . acetaminophen (TYLENOL) 325 MG tablet Take 325 mg by mouth at bedtime as needed (pain/sleep).  . Alirocumab (PRALUENT) 75 MG/ML SOPN Inject 75 mg into the skin every 14 (fourteen) days.  Marland Kitchen  ketoconazole (NIZORAL) 2 % cream Apply 1 application topically daily as needed for irritation.  Marland Kitchen MELATONIN PO Take 2 mg by mouth at bedtime.  . naproxen sodium (ANAPROX) 220 MG tablet Take 220 mg by mouth 2 (two) times daily as needed (pain).  . Resveratrol 250 MG CAPS Take 250 mg by mouth daily.  Alveda Reasons 20 MG TABS tablet TAKE 1 TABLET BY MOUTH EVERY DAY WITH SUPPER   No facility-administered encounter medications on file as of 04/23/2016.       Review of Systems  Constitutional: Negative.   HENT: Negative.   Eyes: Negative.   Respiratory: Negative.   Cardiovascular: Negative.   Gastrointestinal: Positive for nausea and vomiting. Negative for constipation and diarrhea.       Belching - upon episodes = has had 4 episodes  Endocrine: Negative.   Genitourinary: Negative.   Musculoskeletal: Negative.   Skin: Negative.   Allergic/Immunologic: Negative.   Neurological: Negative.   Hematological: Negative.   Psychiatric/Behavioral: Negative.        Objective:   Physical Exam  Constitutional: He is oriented to person, place, and time. He appears well-developed and well-nourished. No distress.  HENT:  Head: Normocephalic and atraumatic.  Eyes: Conjunctivae and EOM are normal. Pupils are equal, round, and reactive to light. Right eye exhibits no discharge. Left eye exhibits no discharge. No scleral icterus.  Neck: Normal range of motion. Neck supple. No thyromegaly present.  Cardiovascular: Normal rate, regular rhythm and normal heart sounds.   No murmur heard. The  heart is 60/m with a regular rate and rhythm  Pulmonary/Chest: Effort normal and breath sounds normal. No respiratory distress. He has no wheezes. He has no rales. He exhibits no tenderness.  Clear anteriorly and posteriorly and no axillary adenopathy  Abdominal: Soft. Bowel sounds are normal. He exhibits no mass. There is no tenderness. There is no rebound and no guarding.  On the exam today there was no liver or spleen  enlargement and no epigastric tenderness and no inguinal adenopathy.  Musculoskeletal: Normal range of motion. He exhibits no edema.  Lymphadenopathy:    He has no cervical adenopathy.  Neurological: He is alert and oriented to person, place, and time.  Skin: Skin is warm and dry. No rash noted.  Psychiatric: He has a normal mood and affect. His behavior is normal. Judgment and thought content normal.  Nursing note and vitals reviewed.  BP 130/72 (BP Location: Left Arm)   Pulse 70   Temp (!) 96.9 F (36.1 C) (Oral)   Ht _0  (1.778 m)   Wt 162 lb (73.5 kg)   BMI 23.24 kg/m         Assessment & Plan:  1. Nausea and vomiting, intractability of vomiting not specified, unspecified vomiting type -Take ranitidine 150 mg, the equate brand 1 twice daily before breakfast and supper -Reduce alcohol intake and do not take any NSAIDs like ibuprofen and Aleve - BMP8+EGFR - CBC with Differential/Platelet - Hepatic function panel - Ambulatory referral to Gastroenterology  2. Belching -Take ranitidine as directed - BMP8+EGFR - CBC with Differential/Platelet - Hepatic function panel - Ambulatory referral to Gastroenterology  3. Family history of pancreatic cancer - Ambulatory referral to Gastroenterology  4. Family history of colon cancer -The patient's paternal grandmother had colon cancer.  5. Paroxysmal atrial fibrillation (HCC) -He will continue to follow-up with cardiology as planned and take his Xarelto. Patient Instructions  We will arrange for you to have a visit with the gastroenterologist. He may consider doing an endoscopy on you and he may also want to go ahead and do your colonoscopy since you're going to be do that in June. Take ranitidine 150 mg twice daily the equate brand for breakfast and supper until the visit with the gastroenterologist Reduce alcohol intake and NSAID intake and spicy food intake. Return the FOBT as directed    Arrie Senate MD   .

## 2016-04-24 ENCOUNTER — Other Ambulatory Visit: Payer: Medicare Other

## 2016-04-24 DIAGNOSIS — Z1211 Encounter for screening for malignant neoplasm of colon: Secondary | ICD-10-CM

## 2016-04-24 LAB — CBC WITH DIFFERENTIAL/PLATELET
BASOS: 0 %
Basophils Absolute: 0 10*3/uL (ref 0.0–0.2)
EOS (ABSOLUTE): 0.2 10*3/uL (ref 0.0–0.4)
Eos: 4 %
HEMATOCRIT: 39.4 % (ref 37.5–51.0)
Hemoglobin: 13 g/dL (ref 13.0–17.7)
IMMATURE GRANS (ABS): 0 10*3/uL (ref 0.0–0.1)
Immature Granulocytes: 0 %
LYMPHS: 35 %
Lymphocytes Absolute: 2.1 10*3/uL (ref 0.7–3.1)
MCH: 31 pg (ref 26.6–33.0)
MCHC: 33 g/dL (ref 31.5–35.7)
MCV: 94 fL (ref 79–97)
MONOCYTES: 8 %
MONOS ABS: 0.5 10*3/uL (ref 0.1–0.9)
NEUTROS PCT: 53 %
Neutrophils Absolute: 3.2 10*3/uL (ref 1.4–7.0)
PLATELETS: 216 10*3/uL (ref 150–379)
RBC: 4.2 x10E6/uL (ref 4.14–5.80)
RDW: 15 % (ref 12.3–15.4)
WBC: 6.1 10*3/uL (ref 3.4–10.8)

## 2016-04-24 LAB — HEPATIC FUNCTION PANEL
ALT: 10 IU/L (ref 0–44)
AST: 15 IU/L (ref 0–40)
Albumin: 3.9 g/dL (ref 3.5–4.8)
Alkaline Phosphatase: 88 IU/L (ref 39–117)
BILIRUBIN, DIRECT: 0.1 mg/dL (ref 0.00–0.40)
Bilirubin Total: 0.3 mg/dL (ref 0.0–1.2)
Total Protein: 6 g/dL (ref 6.0–8.5)

## 2016-04-24 LAB — BMP8+EGFR
BUN / CREAT RATIO: 17 (ref 10–24)
BUN: 16 mg/dL (ref 8–27)
CALCIUM: 9.3 mg/dL (ref 8.6–10.2)
CO2: 26 mmol/L (ref 18–29)
CREATININE: 0.93 mg/dL (ref 0.76–1.27)
Chloride: 104 mmol/L (ref 96–106)
GFR calc Af Amer: 95 mL/min/{1.73_m2} (ref >59)
GFR calc non Af Amer: 82 mL/min/{1.73_m2} (ref >59)
GLUCOSE: 65 mg/dL (ref 65–99)
Potassium: 4.3 mmol/L (ref 3.5–5.2)
Sodium: 143 mmol/L (ref 134–144)

## 2016-04-25 LAB — FECAL OCCULT BLOOD, IMMUNOCHEMICAL: FECAL OCCULT BLD: NEGATIVE

## 2016-05-01 ENCOUNTER — Telehealth: Payer: Self-pay

## 2016-05-01 ENCOUNTER — Encounter: Payer: Self-pay | Admitting: Gastroenterology

## 2016-05-01 ENCOUNTER — Ambulatory Visit (INDEPENDENT_AMBULATORY_CARE_PROVIDER_SITE_OTHER): Payer: Medicare Other | Admitting: Gastroenterology

## 2016-05-01 VITALS — BP 116/80 | HR 60 | Ht 70.0 in | Wt 162.2 lb

## 2016-05-01 DIAGNOSIS — R1310 Dysphagia, unspecified: Secondary | ICD-10-CM | POA: Insufficient documentation

## 2016-05-01 DIAGNOSIS — I480 Paroxysmal atrial fibrillation: Secondary | ICD-10-CM

## 2016-05-01 DIAGNOSIS — Z86010 Personal history of colonic polyps: Secondary | ICD-10-CM | POA: Insufficient documentation

## 2016-05-01 DIAGNOSIS — R112 Nausea with vomiting, unspecified: Secondary | ICD-10-CM

## 2016-05-01 DIAGNOSIS — Z7901 Long term (current) use of anticoagulants: Secondary | ICD-10-CM | POA: Diagnosis not present

## 2016-05-01 MED ORDER — NA SULFATE-K SULFATE-MG SULF 17.5-3.13-1.6 GM/177ML PO SOLN: ORAL | 0 refills | Status: DC

## 2016-05-01 NOTE — Patient Instructions (Signed)
You have been scheduled for an endoscopy and colonoscopy. Please follow the written instructions given to you at your visit today. Please pick up your prep supplies at the pharmacy within the next 1-3 days. If you use inhalers (even only as needed), please bring them with you on the day of your procedure. Your physician has requested that you go to www.startemmi.com and enter the access code given to you at your visit today. This web site gives a general overview about your procedure. However, you should still follow specific instructions given to you by our office regarding your preparation for the procedure.  You will be contacted by our office prior to your procedure for directions on holding your xarelto.  If you do not hear from our office 1 week prior to your scheduled procedure, please call [phone] to discuss.   If you are age 66 or older, your body mass index should be between 23-30. Your Body mass index is 23.28 kg/m. If this is out of the aforementioned range listed, please consider follow up with your Primary Care Provider.  If you are age 51 or younger, your body mass index should be between 19-25. Your Body mass index is 23.28 kg/m. If this is out of the aformentioned range listed, please consider follow up with your Primary Care Provider.

## 2016-05-01 NOTE — Telephone Encounter (Signed)
   Dustin Bauer 12/09/1945 [id number]  Dear Dr Aundra Dubin:  We have scheduled the above named patient for a(n) endoscopy/colonoscopy procedure. Our records show that (s)he is on anticoagulation therapy.  Please advise as to whether the patient may come of their therapy of Xarelto 2 days prior to their procedure which is scheduled for 06/26/16.  Please route your response to Thurmon Fair, RMA or fax response to [phone].  Sincerely,   [facility]

## 2016-05-01 NOTE — Progress Notes (Signed)
Agree with assessment and plan as outlined.  

## 2016-05-01 NOTE — Progress Notes (Signed)
05/01/2016 Dustin Bauer [id number] 31-Jan-1945   HISTORY OF PRESENT ILLNESS:  This is a pleasant 71 year old male who is new to our practice. He presents to our office today at the request of his PCP, Dr. Laurance Flatten, for evaluation regarding recent symptoms of episodic nausea, vomiting, reflux.  He tells me that he's had 3 or 4 episodes of these symptoms over the past 2-3 months time.  He says that he starts with some reflux type symptoms and some belching then he will be nauseated and vomit. The symptoms of feeling poorly will last no more than a day and then he feels fine until they occur again. He denies any ever having any previous history of reflux issues. Has never been on any regular acid medications. He also reports that food seems to be getting hung up at times as well recently. He denies daily NSAID use but says that he does take some Aleve, maybe 2 times per week.  He had a colonoscopy by Dr. Arsenio Loader at [facility] in June 2013 at which time he had one small rectal polyp removed that was a tubular adenoma on pathology.  Denies any rectal bleeding or issues with moving his bowels.  He is on Xarelto, which is prescribed by Dr. Aundra Dubin for paroxysmal atrial fibrillation.   Past Medical History:  Diagnosis Date  . Allergy to ACE inhibitors    Cough and fatigue with ACEI  . CAD (coronary artery disease)    a. LHC in 2011 for stable angina >> DES to pLAD [facility])  //  b. inf-post STEMI 7/15 c/b CHB with jxn escape >> LHC: OM1 60-70, pLAD 30-40, m-d RCA occluded, EF 50-55 >> PCI: 3 x 33 mm Xience DES to RCA  //  c. LHC 8/17: dLM 20, pLAD 20, pLAD stent ok, pRI 30, pRCA stent ok with 30% ISR  . Colon polyps   . History of echocardiogram    Echo 12/15:  Mild LVH, EF 60-65%, normal wall motion, grade 1 diastolic dysfunction, LA upper limits of normal, mild RAE  . Hydrocele, left   . Hyperlipidemia    Myalgias with Zocor, Crestor, atorvastatin.   Marland Kitchen Hyperplasia of prostate   . Hypertension   .  Hypothyroidism   . Nicotine abuse   . PAF (paroxysmal atrial fibrillation) [facility])    s/p ablation by Dr Roxan Hockey at [facility] in 2012 // Xarelto anticoagulation  //  ILR removed 2/17 // Event Monitor 11/17: Predominantly NSR (90%). Paroxysmal atrial fibrillation (10%).  Atrial fibrillation appears to be rate-controlled when it occurs.    Past Surgical History:  Procedure Laterality Date  . ATRIAL FIBRILLATION ABLATION  07/2010   Afib ablation by Dr Roxan Hockey at [facility] in 2012  . CARDIAC CATHETERIZATION    . CARDIAC CATHETERIZATION N/A 08/17/2015   Procedure: Left Heart Cath and Coronary Angiography;  Surgeon: Larey Dresser, MD;  Location: [place] CV LAB;  Service: Cardiovascular;  Laterality: N/A;  . CORONARY STENT PLACEMENT    . EP IMPLANTABLE DEVICE N/A 03/09/2015   Procedure: Loop Recorder Removal;  Surgeon: [name], MD;  Location: [place] CV LAB;  Service: Cardiovascular;  Laterality: N/A;  . implantable loop recorder implantation  08/13/2010   MDT Reveal XT implanted by Dr Roxan Hockey for afib management post ablation  . LEFT HEART CATH N/A 07/15/2013   Procedure: LEFT HEART CATH;  Surgeon: Clent Demark, MD;  Location: [facility] CATH LAB;  Service: Cardiovascular;  Laterality: N/A;  . TONSILLECTOMY AND  ADENOIDECTOMY      reports that he quit smoking about 12 years ago. His smoking use included Cigarettes. He has a 7.50 pack-year smoking history. He has never used smokeless tobacco. He reports that he drinks about 0.6 oz of alcohol per week . He reports that he does not use drugs. family history includes Cancer in his sister; Colon cancer in his paternal grandmother; Dementia in his mother; Heart disease in his father; Hyperlipidemia in his father. Allergies  Allergen Reactions  . Clopidogrel Other (See Comments)    unknown reaction  . Crestor [Rosuvastatin Calcium] Other (See Comments)    Extreme muscular weakness  . Lipitor [Atorvastatin Calcium] Other (See Comments)     Extreme muscular weakness   . Statins Other (See Comments)    Extreme muscular weakness       Outpatient Encounter Prescriptions as of 05/01/2016  Medication Sig  . acetaminophen (TYLENOL) 325 MG tablet Take 325 mg by mouth at bedtime as needed (pain/sleep).  . Alirocumab (PRALUENT) 75 MG/ML SOPN Inject 75 mg into the skin every 14 (fourteen) days.  Marland Kitchen ketoconazole (NIZORAL) 2 % cream Apply 1 application topically daily as needed for irritation.  Marland Kitchen MELATONIN PO Take 2 mg by mouth at bedtime.  . naproxen sodium (ANAPROX) 220 MG tablet Take 220 mg by mouth 2 (two) times daily as needed (pain).  . Resveratrol 250 MG CAPS Take 250 mg by mouth daily.  Alveda Reasons 20 MG TABS tablet TAKE 1 TABLET BY MOUTH EVERY DAY WITH SUPPER   No facility-administered encounter medications on file as of 05/01/2016.      REVIEW OF SYSTEMS  : All other systems reviewed and negative except where noted in the History of Present Illness.   PHYSICAL EXAM: BP 116/80   Pulse 60   Ht 5\' 10"  (1.778 m)   Wt 162 lb 4 oz (73.6 kg)   BMI 23.28 kg/m  General: Well developed white male in no acute distress Head: Normocephalic and atraumatic Eyes:  Sclerae anicteric, conjunctiva pink. Ears: Normal auditory acuity Lungs: Clear throughout to auscultation Heart: Regular rate and rhythm Abdomen: Soft, non-distended. Normal bowel sounds.  Non-tender. Rectal:  Will be done at the time of colonoscopy. Musculoskeletal: Symmetrical with no gross deformities  Skin: No lesions on visible extremities Extremities: No edema  Neurological: Alert oriented x 4, grossly non-focal Psychological:  Alert and cooperative. Normal mood and affect  ASSESSMENT AND PLAN: -Personal history of colon polyps:  Adenomatous polyp in 06/2011.  Will schedule colonoscopy with Dr. Havery Moros. -Episodic nausea, vomiting, GERD type symptoms; some dysphagia:  Will schedule EGD with possible dilation as well.  Symptoms could all be reflux related, but  with the episodic nature he could think of possibly gallbladder as well. -Chronic anticoagulation with Xarelto for PAF:  Will hold Xarelto for 2 days prior to endoscopic procedures - will instruct when and how to resume after procedure. Benefits and risks of procedure explained including risks of bleeding, perforation, infection, missed lesions, reactions to medications and possible need for hospitalization and surgery for complications. Additional rare but real risk of stroke or other vascular clotting events off of Xarelto also explained and need to seek urgent help if any signs of these problems occur. Will communicate by phone or EMR with patient's prescribing provider, Dr. Aundra Dubin, to confirm that holding Xarelto is reasonable in this case.   CC:  Chipper Herb, MD

## 2016-05-01 NOTE — Telephone Encounter (Signed)
OK to hold Xarelto for 2 days for procedure.

## 2016-05-05 NOTE — Telephone Encounter (Signed)
Pt advised to hold Xarelto 2 days prior to procedure per Dr. Aundra Dubin.

## 2016-05-22 ENCOUNTER — Telehealth: Payer: Self-pay

## 2016-05-23 NOTE — Telephone Encounter (Signed)
Error  gm

## 2016-06-03 ENCOUNTER — Ambulatory Visit: Payer: Medicare Other | Admitting: Family Medicine

## 2016-06-04 ENCOUNTER — Ambulatory Visit (HOSPITAL_COMMUNITY)
Admission: RE | Admit: 2016-06-04 | Discharge: 2016-06-04 | Disposition: A | Discharge status: Home / Self Care | Payer: Medicare Other | Source: Ambulatory Visit | Attending: Nurse Practitioner | Admitting: Nurse Practitioner

## 2016-06-04 ENCOUNTER — Encounter (HOSPITAL_COMMUNITY): Payer: Self-pay | Admitting: Nurse Practitioner

## 2016-06-04 VITALS — BP 132/84 | HR 56 | Ht 70.0 in | Wt 162.2 lb

## 2016-06-04 DIAGNOSIS — I10 Essential (primary) hypertension: Secondary | ICD-10-CM | POA: Diagnosis not present

## 2016-06-04 DIAGNOSIS — E039 Hypothyroidism, unspecified: Secondary | ICD-10-CM | POA: Diagnosis not present

## 2016-06-04 DIAGNOSIS — Z9889 Other specified postprocedural states: Secondary | ICD-10-CM | POA: Insufficient documentation

## 2016-06-04 DIAGNOSIS — Z800 Family history of malignant neoplasm of digestive organs: Secondary | ICD-10-CM | POA: Insufficient documentation

## 2016-06-04 DIAGNOSIS — Z8249 Family history of ischemic heart disease and other diseases of the circulatory system: Secondary | ICD-10-CM | POA: Insufficient documentation

## 2016-06-04 DIAGNOSIS — Z888 Allergy status to other drugs, medicaments and biological substances status: Secondary | ICD-10-CM | POA: Diagnosis not present

## 2016-06-04 DIAGNOSIS — E785 Hyperlipidemia, unspecified: Secondary | ICD-10-CM | POA: Diagnosis not present

## 2016-06-04 DIAGNOSIS — I2510 Atherosclerotic heart disease of native coronary artery without angina pectoris: Secondary | ICD-10-CM | POA: Insufficient documentation

## 2016-06-04 DIAGNOSIS — Z955 Presence of coronary angioplasty implant and graft: Secondary | ICD-10-CM | POA: Insufficient documentation

## 2016-06-04 DIAGNOSIS — Z87891 Personal history of nicotine dependence: Secondary | ICD-10-CM | POA: Diagnosis not present

## 2016-06-04 DIAGNOSIS — I480 Paroxysmal atrial fibrillation: Secondary | ICD-10-CM | POA: Diagnosis not present

## 2016-06-04 DIAGNOSIS — Z86010 Personal history of colonic polyps: Secondary | ICD-10-CM | POA: Insufficient documentation

## 2016-06-04 DIAGNOSIS — Z820 Family history of epilepsy and other diseases of the nervous system: Secondary | ICD-10-CM | POA: Diagnosis not present

## 2016-06-04 DIAGNOSIS — I252 Old myocardial infarction: Secondary | ICD-10-CM | POA: Insufficient documentation

## 2016-06-04 DIAGNOSIS — Z7902 Long term (current) use of antithrombotics/antiplatelets: Secondary | ICD-10-CM | POA: Insufficient documentation

## 2016-06-04 DIAGNOSIS — N400 Enlarged prostate without lower urinary tract symptoms: Secondary | ICD-10-CM | POA: Diagnosis not present

## 2016-06-04 MED ORDER — APIXABAN 5 MG PO TABS: 5.0000 mg | ORAL_TABLET | Freq: Two times a day (BID) | ORAL | 0 refills | Status: DC

## 2016-06-04 NOTE — Progress Notes (Signed)
Primary Care Physician: Chipper Herb, MD Referring Physician: Dr. Colen Darling is a 71 y.o. male with a h/o CAD,s/p PCI to LAD 2011, paroxysmal afib s/p ablation.In 08/2015 at [facility]. He had repeat LHC showing patent LAD/RCA stents. HR 's were noted to be in the 40's during cath. He wore a f/u event monitor without any significant bradycardia and afib burden was less than 10%.   He is in the afib clinic for evaluation. Pt has not noted any afib but it has always been hard for him to know when he is in afib. He is very active going to the gym and walking and at baseline has a slow heart beat from which he is asymptomatic. He is having issues with nausea, occasional vomiting and abdominal pain and is questioning if it could be the xarelto. He is pending GI tests in the next 3 weeks. Currently denies any anginal symptoms.  Today, he denies symptoms of palpitations, chest pain, shortness of breath, orthopnea, PND, lower extremity edema, dizziness, presyncope, syncope, or neurologic sequela. The patient is tolerating medications without difficulties and is otherwise without complaint today.   Past Medical History:  Diagnosis Date  . Allergy to ACE inhibitors    Cough and fatigue with ACEI  . CAD (coronary artery disease)    a. LHC in 2011 for stable angina >> DES to pLAD [facility])  //  b. inf-post STEMI 7/15 c/b CHB with jxn escape >> LHC: OM1 60-70, pLAD 30-40, m-d RCA occluded, EF 50-55 >> PCI: 3 x 33 mm Xience DES to RCA  //  c. LHC 8/17: dLM 20, pLAD 20, pLAD stent ok, pRI 30, pRCA stent ok with 30% ISR  . Colon polyps   . History of echocardiogram    Echo 12/15:  Mild LVH, EF 60-65%, normal wall motion, grade 1 diastolic dysfunction, LA upper limits of normal, mild RAE  . Hydrocele, left   . Hyperlipidemia    Myalgias with Zocor, Crestor, atorvastatin.   Marland Kitchen Hyperplasia of prostate   . Hypertension   . Hypothyroidism   . Nicotine abuse   . PAF (paroxysmal atrial  fibrillation) [facility])    s/p ablation by Dr Roxan Hockey at [facility] in 2012 // Xarelto anticoagulation  //  ILR removed 2/17 // Event Monitor 11/17: Predominantly NSR (90%). Paroxysmal atrial fibrillation (10%).  Atrial fibrillation appears to be rate-controlled when it occurs.    Past Surgical History:  Procedure Laterality Date  . ATRIAL FIBRILLATION ABLATION  07/2010   Afib ablation by Dr Roxan Hockey at [facility] in 2012  . CARDIAC CATHETERIZATION    . CARDIAC CATHETERIZATION N/A 08/17/2015   Procedure: Left Heart Cath and Coronary Angiography;  Surgeon: Larey Dresser, MD;  Location: [place] CV LAB;  Service: Cardiovascular;  Laterality: N/A;  . CORONARY STENT PLACEMENT    . EP IMPLANTABLE DEVICE N/A 03/09/2015   Procedure: Loop Recorder Removal;  Surgeon: [name], MD;  Location: [place] CV LAB;  Service: Cardiovascular;  Laterality: N/A;  . implantable loop recorder implantation  08/13/2010   MDT Reveal XT implanted by Dr Roxan Hockey for afib management post ablation  . LEFT HEART CATH N/A 07/15/2013   Procedure: LEFT HEART CATH;  Surgeon: Clent Demark, MD;  Location: [facility] CATH LAB;  Service: Cardiovascular;  Laterality: N/A;  . TONSILLECTOMY AND ADENOIDECTOMY      Current Outpatient Prescriptions  Medication Sig Dispense Refill  . acetaminophen (TYLENOL) 325 MG tablet Take 325 mg  by mouth at bedtime as needed (pain/sleep).    . Alirocumab (PRALUENT) 75 MG/ML SOPN Inject 75 mg into the skin every 14 (fourteen) days. 2 pen 11  . ketoconazole (NIZORAL) 2 % cream Apply 1 application topically daily as needed for irritation. 15 g 1  . MELATONIN PO Take 2 mg by mouth at bedtime.    Marland Kitchen Resveratrol 250 MG CAPS Take 250 mg by mouth daily.    Marland Kitchen apixaban (ELIQUIS) 5 MG TABS tablet Take 1 tablet (5 mg total) by mouth 2 (two) times daily. 60 tablet 0  . Na Sulfate-K Sulfate-Mg Sulf 17.5-3.13-1.6 GM/180ML SOLN Suprep-Use as directed (Patient not taking: Reported on 06/04/2016) 354 mL 0   No  current facility-administered medications for this encounter.     Allergies  Allergen Reactions  . Clopidogrel Other (See Comments)    unknown reaction  . Crestor [Rosuvastatin Calcium] Other (See Comments)    Extreme muscular weakness  . Lipitor [Atorvastatin Calcium] Other (See Comments)    Extreme muscular weakness   . Statins Other (See Comments)    Extreme muscular weakness     Social History   Social History  . Marital status: Married    Spouse name: N/A  . Number of children: 1  . Years of education: N/A   Occupational History  . retired    Social History Main Topics  . Smoking status: Former Smoker    Packs/day: 0.25    Years: 30.00    Types: Cigarettes    Quit date: 07/21/2003  . Smokeless tobacco: Never Used  . Alcohol use 0.6 oz/week    1 Glasses of wine per week     Comment: occasional wine  . Drug use: No  . Sexual activity: Yes   Other Topics Concern  . Not on file   Social History Narrative   Pt lives in [place] with wife.  Retired from [employer].  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    Family History  Problem Relation Age of Onset  . Dementia Mother   . Heart disease Father   . Hyperlipidemia Father   . Colon cancer Paternal Grandmother   . Cancer Sister        pancreatic    ROS- All systems are reviewed and negative except as per the HPI above  Physical Exam: Vitals:   06/04/16 1406  BP: 132/84  Pulse: (!) 56  Weight: 162 lb 3.2 oz (73.6 kg)  Height: 5\' 10"  (1.778 m)   Wt Readings from Last 3 Encounters:  06/04/16 162 lb 3.2 oz (73.6 kg)  05/01/16 162 lb 4 oz (73.6 kg)  04/23/16 162 lb (73.5 kg)    Labs: Lab Results  Component Value Date   NA 143 04/23/2016   K 4.3 04/23/2016   CL 104 04/23/2016   CO2 26 04/23/2016   GLUCOSE 65 04/23/2016   BUN 16 04/23/2016   CREATININE 0.93 04/23/2016   CALCIUM 9.3 04/23/2016   Lab Results  Component Value Date   INR 1.1 08/13/2015   Lab  Results  Component Value Date   CHOL 120 11/27/2015   HDL 55 11/27/2015   LDLCALC 48 11/27/2015   TRIG 83 11/27/2015     GEN- The patient is well appearing, alert and oriented x 3 today.   Head- normocephalic, atraumatic Eyes-  Sclera clear, conjunctiva pink Ears- hearing intact Oropharynx- clear Neck- supple, no JVP Lymph- no cervical lymphadenopathy Lungs- Clear to ausculation bilaterally, normal work  of breathing Heart- slow regular rate and rhythm, no murmurs, rubs or gallops, PMI not laterally displaced GI- soft, NT, ND, + BS Extremities- no clubbing, cyanosis, or edema MS- no significant deformity or atrophy Skin- no rash or lesion Psych- euthymic mood, full affect Neuro- strength and sensation are intact  EKG-Sinus brady at 56 bpm, PAC's, pr int 246 ms, qrs int 78 ms, qtc 413 ms Epic records reviewed    Assessment and Plan: 1. Paroxysmal afib Appears to be quiet No rate control drugs on board due to bradycardia which pt is tolerating  He would like to change to another DOAC due to abdominal symptoms to see if there is improvement Will change to eliquis 5 mg bid , 30 day free card given  If there is improvement , call  office and refills will be give Continue with plans for GI tests Will need to hold Doac x 2 days prior to tests  2. CAD Stable No anginal symptoms   Has f/u with Dr. Aundra Dubin in the fall Afib clinic as needed  [place]. [place], [facility] [address] [place], [place] [address] [phone]

## 2016-06-04 NOTE — Patient Instructions (Signed)
Your physician has recommended you make the following change in your medication:  1)Stop Xarelto 2)Start Eliquis 5mg  twice a day

## 2016-06-05 ENCOUNTER — Ambulatory Visit (INDEPENDENT_AMBULATORY_CARE_PROVIDER_SITE_OTHER): Payer: Medicare Other | Admitting: Family Medicine

## 2016-06-05 ENCOUNTER — Encounter: Payer: Self-pay | Admitting: Family Medicine

## 2016-06-05 VITALS — BP 130/74 | HR 44 | Temp 97.3°F | Ht 70.0 in | Wt 161.0 lb

## 2016-06-05 DIAGNOSIS — I10 Essential (primary) hypertension: Secondary | ICD-10-CM

## 2016-06-05 DIAGNOSIS — I2510 Atherosclerotic heart disease of native coronary artery without angina pectoris: Secondary | ICD-10-CM | POA: Diagnosis not present

## 2016-06-05 DIAGNOSIS — I480 Paroxysmal atrial fibrillation: Secondary | ICD-10-CM | POA: Diagnosis not present

## 2016-06-05 DIAGNOSIS — E7800 Pure hypercholesterolemia, unspecified: Secondary | ICD-10-CM

## 2016-06-05 DIAGNOSIS — E559 Vitamin D deficiency, unspecified: Secondary | ICD-10-CM | POA: Diagnosis not present

## 2016-06-05 NOTE — Patient Instructions (Addendum)
Medicare Annual Wellness Visit  [facility] and the medical providers at [facility] strive to bring you the best medical care.  In doing so we not only want to address your current medical conditions and concerns but also to detect new conditions early and prevent illness, disease and health-related problems.    Medicare offers a yearly Wellness Visit which allows our clinical staff to assess your need for preventative services including immunizations, lifestyle education, counseling to decrease risk of preventable diseases and screening for fall risk and other medical concerns.    This visit is provided free of charge (no copay) for all Medicare recipients. The clinical pharmacists at [facility] have begun to conduct these Wellness Visits which will also include a thorough review of all your medications.    As you primary medical provider recommend that you make an appointment for your Annual Wellness Visit if you have not done so already this year.  You may set up this appointment before you leave today or you may call back ([phone]) and schedule an appointment.  Please make sure when you call that you mention that you are scheduling your Annual Wellness Visit with the clinical pharmacist so that the appointment may be made for the proper length of time.     Continue current medications. Continue good therapeutic lifestyle changes which include good diet and exercise. Fall precautions discussed with patient. If an FOBT was given today- please return it to our front desk. If you are over 71 years old - you may need Prevnar 55 or the adult Pneumonia vaccine.  **Flu shots are available--- please call and schedule a FLU-CLINIC appointment**  After your visit with [place] today you will receive a survey in the mail or online from [employer] regarding your care with [place]. Please take a moment to fill this out. Your feedback is very  important to [place] as you can help [place] better understand your patient needs as well as improve your experience and satisfaction. WE CARE ABOUT YOU!!!  Follow-up with cardiology as planned Follow-up with gastroenterologist and get endoscopy and colonoscopy as planned This summer drink plenty of fluids and stay well hydrated Avoid exercising in the heat of the day Always a attention to tick bites and removal of ticks promptly after being out in the yard Use deep Lester off on your legs to help prevent tick bites

## 2016-06-05 NOTE — Addendum Note (Signed)
Addended by: Zannie Cove on: 06/05/2016 03:21 PM   Modules accepted: Orders

## 2016-06-05 NOTE — Progress Notes (Signed)
Subjective:    Patient ID: Dustin Bauer, male    DOB: 1945/04/20, 71 y.o.   MRN: [id number]  HPI Pt here for follow up and management of chronic medical problems which includes a fib, hyperlipidemia and hypertension. He is taking medication regularly.The patient is doing well overall. He just saw the cardiologist recently and that note was reviewed before going to the visit. His blood thinner was changed at the patient's request and he is to let the cardiologist know if he feels better with this particular medicine. The patient is planning to have an endoscopy and colonoscopy very soon with Dr. Elmo Putt. Some of this stems from his complaints with the nausea and vomiting and abdominal discomfort that he has been having. He denies any chest pain or shortness of breath. He denies any blood in the stool or black tarry bowel movements. He says he's passing his water well. He is supposed to let the PA with the cardiologist know how his symptoms are with switching to eliquis.    Patient Active Problem List   Diagnosis Date Noted  . Nausea and vomiting 05/01/2016  . History of colonic polyps 05/01/2016  . Dysphagia 05/01/2016  . Chronic anticoagulation 05/01/2016  . History of heart block 08/13/2015  . Dyspnea 06/13/2015  . Multiple pulmonary nodules 06/13/2015  . Hypertensive cardiovascular disease 04/17/2014  . CAD (coronary artery disease) 12/16/2013  . History of acute inferior wall myocardial infarction 07/15/2013  . Abnormal chest CT 05/30/2013  . Elevated PSA 05/30/2013  . Hyperlipidemia 06/24/2012  . Paroxysmal atrial fibrillation ([place]) 06/24/2012  . Statin intolerance 06/24/2012   Outpatient Encounter Prescriptions as of 06/05/2016  Medication Sig  . acetaminophen (TYLENOL) 325 MG tablet Take 325 mg by mouth at bedtime as needed (pain/sleep).  . Alirocumab (PRALUENT) 75 MG/ML SOPN Inject 75 mg into the skin every 14 (fourteen) days.  Marland Kitchen apixaban (ELIQUIS) 5 MG TABS tablet Take 1 tablet  (5 mg total) by mouth 2 (two) times daily.  Marland Kitchen ketoconazole (NIZORAL) 2 % cream Apply 1 application topically daily as needed for irritation.  Marland Kitchen MELATONIN PO Take 2 mg by mouth at bedtime.  . Na Sulfate-K Sulfate-Mg Sulf 17.5-3.13-1.6 GM/180ML SOLN Suprep-Use as directed  . Resveratrol 250 MG CAPS Take 250 mg by mouth daily.   No facility-administered encounter medications on file as of 06/05/2016.       Review of Systems  Constitutional: Negative.   HENT: Negative.   Eyes: Negative.   Respiratory: Negative.   Cardiovascular: Negative.   Gastrointestinal: Negative.   Endocrine: Negative.   Genitourinary: Negative.   Musculoskeletal: Negative.   Skin: Negative.   Allergic/Immunologic: Negative.   Neurological: Negative.   Hematological: Negative.   Psychiatric/Behavioral: Negative.        Objective:   Physical Exam  Constitutional: He is oriented to person, place, and time. He appears well-developed and well-nourished. No distress.  The patient is pleasant and alert  HENT:  Head: Normocephalic and atraumatic.  Right Ear: External ear normal.  Left Ear: External ear normal.  Nose: Nose normal.  Mouth/Throat: Oropharynx is clear and moist. No oropharyngeal exudate.  Eyes: Conjunctivae and EOM are normal. Pupils are equal, round, and reactive to light. Right eye exhibits no discharge. Left eye exhibits no discharge. No scleral icterus.  Neck: Normal range of motion. Neck supple. No thyromegaly present.  No bruits thyromegaly or anterior cervical adenopathy  Cardiovascular: Normal heart sounds and intact distal pulses.   No murmur heard. The heart  is irregular irregular at 52/m  Pulmonary/Chest: Effort normal and breath sounds normal. No respiratory distress. He has no wheezes. He has no rales. He exhibits no tenderness.  No axillary adenopathy Clear anteriorly and posteriorly  Abdominal: Soft. Bowel sounds are normal. He exhibits no mass. There is no tenderness. There is no  rebound and no guarding.  No abdominal tenderness masses or organ enlargement or bruits or inguinal adenopathy  Musculoskeletal: Normal range of motion. He exhibits no edema.  Lymphadenopathy:    He has no cervical adenopathy.  Neurological: He is alert and oriented to person, place, and time. He has normal reflexes. No cranial nerve deficit.  Skin: Skin is warm and dry. No rash noted.  Psychiatric: He has a normal mood and affect. His behavior is normal. Judgment and thought content normal.  Nursing note and vitals reviewed.  BP 130/74 (BP Location: Left Arm)   Pulse (!) 44   Temp 97.3 F (36.3 C) (Oral)   Ht 5\' 10"  (1.778 m)   Wt 161 lb (73 kg)   BMI 23.10 kg/m         Assessment & Plan:  1. Paroxysmal atrial fibrillation (HCC) -The patient has bradycardia and paroxysmal atrial fibrillation today. He will follow-up with cardiology as planned and stay on the blood thinner that he is currently on and let the cardiologist know how it is working for him the end of the month.  2. Essential hypertension -The blood pressure is good today and he will continue with current treatment  3. Pure hypercholesterolemia -Continue with pralulent  4. Vitamin D deficiency -Continue with current treatment pending results of lab work  5. ASCVD (arteriosclerotic cardiovascular disease) -Continue to follow-up with cardiology as planned  Patient Instructions                       Medicare Annual Wellness Visit  [facility] and the medical providers at [facility] strive to bring you the best medical care.  In doing so we not only want to address your current medical conditions and concerns but also to detect new conditions early and prevent illness, disease and health-related problems.    Medicare offers a yearly Wellness Visit which allows our clinical staff to assess your need for preventative services including immunizations, lifestyle education, counseling to decrease  risk of preventable diseases and screening for fall risk and other medical concerns.    This visit is provided free of charge (no copay) for all Medicare recipients. The clinical pharmacists at [facility] have begun to conduct these Wellness Visits which will also include a thorough review of all your medications.    As you primary medical provider recommend that you make an appointment for your Annual Wellness Visit if you have not done so already this year.  You may set up this appointment before you leave today or you may call back [phone]) and schedule an appointment.  Please make sure when you call that you mention that you are scheduling your Annual Wellness Visit with the clinical pharmacist so that the appointment may be made for the proper length of time.     Continue current medications. Continue good therapeutic lifestyle changes which include good diet and exercise. Fall precautions discussed with patient. If an FOBT was given today- please return it to our front desk. If you are over 16 years old - you may need Prevnar 63 or the adult Pneumonia vaccine.  **Flu shots  are available--- please call and schedule a FLU-CLINIC appointment**  After your visit with [place] today you will receive a survey in the mail or online from [employer] regarding your care with [place]. Please take a moment to fill this out. Your feedback is very important to [place] as you can help [place] better understand your patient needs as well as improve your experience and satisfaction. WE CARE ABOUT YOU!!!  Follow-up with cardiology as planned Follow-up with gastroenterologist and get endoscopy and colonoscopy as planned This summer drink plenty of fluids and stay well hydrated Avoid exercising in the heat of the day Always a attention to tick bites and removal of ticks promptly after being out in the yard Use deep [place] off on your legs to help prevent tick bites  Arrie Senate MD

## 2016-06-11 ENCOUNTER — Other Ambulatory Visit: Payer: Medicare Other

## 2016-06-11 DIAGNOSIS — I2510 Atherosclerotic heart disease of native coronary artery without angina pectoris: Secondary | ICD-10-CM

## 2016-06-11 DIAGNOSIS — E7800 Pure hypercholesterolemia, unspecified: Secondary | ICD-10-CM

## 2016-06-11 DIAGNOSIS — I10 Essential (primary) hypertension: Secondary | ICD-10-CM

## 2016-06-11 DIAGNOSIS — I480 Paroxysmal atrial fibrillation: Secondary | ICD-10-CM

## 2016-06-11 DIAGNOSIS — E559 Vitamin D deficiency, unspecified: Secondary | ICD-10-CM

## 2016-06-12 ENCOUNTER — Encounter: Payer: Self-pay | Admitting: Gastroenterology

## 2016-06-12 LAB — LIPID PANEL
Chol/HDL Ratio: 2.2 ratio (ref 0.0–5.0)
Cholesterol, Total: 106 mg/dL (ref 100–199)
HDL: 48 mg/dL (ref >39)
LDL Calculated: 44 mg/dL (ref 0–99)
Triglycerides: 70 mg/dL (ref 0–149)
VLDL Cholesterol Cal: 14 mg/dL (ref 5–40)

## 2016-06-12 LAB — CBC WITH DIFFERENTIAL/PLATELET
BASOS ABS: 0 10*3/uL (ref 0.0–0.2)
Basos: 0 %
EOS (ABSOLUTE): 0.4 10*3/uL (ref 0.0–0.4)
Eos: 6 %
Hematocrit: 41.8 % (ref 37.5–51.0)
Hemoglobin: 13.7 g/dL (ref 13.0–17.7)
IMMATURE GRANS (ABS): 0 10*3/uL (ref 0.0–0.1)
IMMATURE GRANULOCYTES: 0 %
Lymphocytes Absolute: 2.2 10*3/uL (ref 0.7–3.1)
Lymphs: 35 %
MCH: 30.9 pg (ref 26.6–33.0)
MCHC: 32.8 g/dL (ref 31.5–35.7)
MCV: 94 fL (ref 79–97)
Monocytes Absolute: 0.6 10*3/uL (ref 0.1–0.9)
Monocytes: 10 %
NEUTROS PCT: 49 %
Neutrophils Absolute: 3 10*3/uL (ref 1.4–7.0)
PLATELETS: 196 10*3/uL (ref 150–379)
RBC: 4.43 x10E6/uL (ref 4.14–5.80)
RDW: 15.6 % — ABNORMAL HIGH (ref 12.3–15.4)
WBC: 6.2 10*3/uL (ref 3.4–10.8)

## 2016-06-12 LAB — HEPATIC FUNCTION PANEL
ALT: 12 IU/L (ref 0–44)
AST: 17 IU/L (ref 0–40)
Albumin: 4.1 g/dL (ref 3.5–4.8)
Alkaline Phosphatase: 84 IU/L (ref 39–117)
Bilirubin Total: 0.4 mg/dL (ref 0.0–1.2)
Bilirubin, Direct: 0.13 mg/dL (ref 0.00–0.40)
Total Protein: 6.1 g/dL (ref 6.0–8.5)

## 2016-06-12 LAB — BMP8+EGFR
BUN/Creatinine Ratio: 17 (ref 10–24)
BUN: 18 mg/dL (ref 8–27)
CHLORIDE: 106 mmol/L (ref 96–106)
CO2: 24 mmol/L (ref 18–29)
Calcium: 9.5 mg/dL (ref 8.6–10.2)
Creatinine, Ser: 1.04 mg/dL (ref 0.76–1.27)
GFR calc Af Amer: 83 mL/min/{1.73_m2} (ref >59)
GFR calc non Af Amer: 72 mL/min/{1.73_m2} (ref >59)
Glucose: 86 mg/dL (ref 65–99)
POTASSIUM: 4.7 mmol/L (ref 3.5–5.2)
Sodium: 142 mmol/L (ref 134–144)

## 2016-06-12 LAB — VITAMIN D 25 HYDROXY (VIT D DEFICIENCY, FRACTURES): Vit D, 25-Hydroxy: 30.8 ng/mL (ref 30.0–100.0)

## 2016-06-26 ENCOUNTER — Ambulatory Visit (AMBULATORY_SURGERY_CENTER): Payer: Medicare Other | Admitting: Gastroenterology

## 2016-06-26 ENCOUNTER — Encounter: Payer: Self-pay | Admitting: Gastroenterology

## 2016-06-26 VITALS — BP 130/67 | HR 66 | Temp 97.1°F | Resp 20 | Ht 70.0 in | Wt 162.0 lb

## 2016-06-26 DIAGNOSIS — D123 Benign neoplasm of transverse colon: Secondary | ICD-10-CM

## 2016-06-26 DIAGNOSIS — D120 Benign neoplasm of cecum: Secondary | ICD-10-CM

## 2016-06-26 DIAGNOSIS — R112 Nausea with vomiting, unspecified: Secondary | ICD-10-CM | POA: Diagnosis not present

## 2016-06-26 DIAGNOSIS — K635 Polyp of colon: Secondary | ICD-10-CM

## 2016-06-26 DIAGNOSIS — D126 Benign neoplasm of colon, unspecified: Secondary | ICD-10-CM

## 2016-06-26 DIAGNOSIS — R1013 Epigastric pain: Secondary | ICD-10-CM | POA: Diagnosis not present

## 2016-06-26 DIAGNOSIS — D128 Benign neoplasm of rectum: Secondary | ICD-10-CM | POA: Diagnosis not present

## 2016-06-26 DIAGNOSIS — K621 Rectal polyp: Secondary | ICD-10-CM | POA: Diagnosis not present

## 2016-06-26 DIAGNOSIS — D125 Benign neoplasm of sigmoid colon: Secondary | ICD-10-CM

## 2016-06-26 DIAGNOSIS — D129 Benign neoplasm of anus and anal canal: Secondary | ICD-10-CM

## 2016-06-26 DIAGNOSIS — Z86010 Personal history of colonic polyps: Secondary | ICD-10-CM | POA: Diagnosis not present

## 2016-06-26 DIAGNOSIS — D122 Benign neoplasm of ascending colon: Secondary | ICD-10-CM

## 2016-06-26 HISTORY — PX: COLONOSCOPY W/ POLYPECTOMY: SHX1380

## 2016-06-26 MED ORDER — SODIUM CHLORIDE 0.9 % IV SOLN: 500.0000 mL | INTRAVENOUS | Status: DC

## 2016-06-26 MED ORDER — OMEPRAZOLE 40 MG PO CPDR: 40.0000 mg | DELAYED_RELEASE_CAPSULE | Freq: Two times a day (BID) | ORAL | 3 refills | Status: DC

## 2016-06-26 NOTE — Patient Instructions (Signed)
YOU HAD AN ENDOSCOPIC PROCEDURE TODAY AT [facility]:   Refer to the procedure report that was given to you for any specific questions about what was found during the examination.  If the procedure report does not answer your questions, please call your gastroenterologist to clarify.  If you requested that your care partner not be given the details of your procedure findings, then the procedure report has been included in a sealed envelope for you to review at your convenience later.  YOU SHOULD EXPECT: Some feelings of bloating in the abdomen. Passage of more gas than usual.  Walking can help get rid of the air that was put into your GI tract during the procedure and reduce the bloating. If you had a lower endoscopy (such as a colonoscopy or flexible sigmoidoscopy) you may notice spotting of blood in your stool or on the toilet paper. If you underwent a bowel prep for your procedure, you may not have a normal bowel movement for a few days.  Please Note:  You might notice some irritation and congestion in your nose or some drainage.  This is from the oxygen used during your procedure.  There is no need for concern and it should clear up in a day or so.  SYMPTOMS TO REPORT IMMEDIATELY:   Following lower endoscopy (colonoscopy or flexible sigmoidoscopy):  Excessive amounts of blood in the stool  Significant tenderness or worsening of abdominal pains  Swelling of the abdomen that is new, acute  Fever of 100F or higher   Following upper endoscopy (EGD)  Vomiting of blood or coffee ground material  New chest pain or pain under the shoulder blades  Painful or persistently difficult swallowing  New shortness of breath  Fever of 100F or higher  Black, tarry-looking stools  For urgent or emergent issues, a gastroenterologist can be reached at any hour by calling [phone].   DIET:  We do recommend a small meal at first, but then you may proceed to your regular diet.  Drink  plenty of fluids but you should avoid alcoholic beverages for 24 hours.  ACTIVITY:  You should plan to take it easy for the rest of today and you should NOT DRIVE or use heavy machinery until tomorrow (because of the sedation medicines used during the test).    FOLLOW UP: Our staff will call the number listed on your records the next business day following your procedure to check on you and address any questions or concerns that you may have regarding the information given to you following your procedure. If we do not reach you, we will leave a message.  However, if you are feeling well and you are not experiencing any problems, there is no need to return our call.  We will assume that you have returned to your regular daily activities without incident.  If any biopsies were taken you will be contacted by phone or by letter within the next 1-3 weeks.  Please call us at [phone] if you have not heard about the biopsies in 3 weeks.    SIGNATURES/CONFIDENTIALITY: You and/or your care partner have signed paperwork which will be entered into your electronic medical record.  These signatures attest to the fact that that the information above on your After Visit Summary has been reviewed and is understood.  Full responsibility of the confidentiality of this discharge information lies with you and/or your care-partner.   Polyp information given.  Diverticulois information given.  Soft diet  as tolerated.  Resume Eliquis in 3 days.  No ibuprofen, naproxren, or other non steroidal anti-inflammatory meds for 2 weeks.  CT abdomen/pelvis .  Contrast to be sent.

## 2016-06-26 NOTE — Progress Notes (Signed)
Pt. Reports no change in his surgical or medical history since pre-visit 05/01/2016.

## 2016-06-26 NOTE — Op Note (Addendum)
[facility] Patient Name: Dustin Bauer Procedure Date: 06/26/2016 1:04 PM MRN: [id number] Endoscopist: Remo Lipps P.  MD, MD Age: 71 Referring MD:  Date of Birth: Jun 30, 1945 Gender: Male Account #: [id number] Procedure:                Colonoscopy Indications:              Surveillance: Personal history of adenomatous                            polyps on last colonoscopy 5 years ago Medicines:                Monitored Anesthesia Care Procedure:                Pre-Anesthesia Assessment:                           - Prior to the procedure, a History and Physical                            was performed, and patient medications and                            allergies were reviewed. The patient's tolerance of                            previous anesthesia was also reviewed. The risks                            and benefits of the procedure and the sedation                            options and risks were discussed with the patient.                            All questions were answered, and informed consent                            was obtained. Prior Anticoagulants: The patient has                            taken Eliquis (apixaban), last dose was 2 days                            prior to procedure. ASA Grade Assessment: III - A                            patient with severe systemic disease. After                            reviewing the risks and benefits, the patient was                            deemed in satisfactory condition to undergo the  procedure.                           After obtaining informed consent, the colonoscope                            was passed under direct vision. Throughout the                            procedure, the patient's blood pressure, pulse, and                            oxygen saturations were monitored continuously. The                            Colonoscope was introduced through the anus and                    advanced to the the cecum, identified by                            appendiceal orifice and ileocecal valve. The                            colonoscopy was performed without difficulty. The                            patient tolerated the procedure well. The quality                            of the bowel preparation was adequate. The                            ileocecal valve, appendiceal orifice, and rectum                            were photographed. Scope In: 1:37:06 PM Scope Out: 2:02:59 PM Scope Withdrawal Time: 0 hours 23 minutes 20 seconds  Total Procedure Duration: 0 hours 25 minutes 53 seconds  Findings:                 The perianal and digital rectal examinations were                            normal.                           A 3 mm polyp was found in the cecum. The polyp was                            sessile. The polyp was removed with a cold snare.                            Resection and retrieval were complete.  A 8 mm polyp was found in the ascending colon. The                            polyp was flat. The polyp was removed with a cold                            snare. Resection and retrieval were complete.                           A 5 mm polyp was found in the transverse colon. The                            polyp was sessile. The polyp was removed with a                            cold snare. Resection and retrieval were complete.                           A 5 mm polyp was found in the sigmoid colon. The                            polyp was sessile. The polyp was removed with a                            cold snare. Resection and retrieval were complete.                           A 5 mm polyp was found in the rectum. The polyp was                            sessile. The polyp was removed with a cold snare.                            Resection and retrieval were complete.                           A few medium-mouthed  diverticula were found in the                            ascending colon and cecum.                           Anal papilla(e) were hypertrophied.                           The colon was extremely spastic, which prolonged                            this procedure. The exam was otherwise without                            abnormality. Complications:  No immediate complications. Estimated blood loss:                            Minimal. Estimated Blood Loss:     Estimated blood loss was minimal. Impression:               - One 3 mm polyp in the cecum, removed with a cold                            snare. Resected and retrieved.                           - One 8 mm polyp in the ascending colon, removed                            with a cold snare. Resected and retrieved.                           - One 5 mm polyp in the transverse colon, removed                            with a cold snare. Resected and retrieved.                           - One 5 mm polyp in the sigmoid colon, removed with                            a cold snare. Resected and retrieved.                           - One 5 mm polyp in the rectum, removed with a cold                            snare. Resected and retrieved.                           - Diverticulosis in the ascending colon and in the                            cecum.                           - Anal papilla(e) were hypertrophied.                           - Spastic colon                           - The examination was otherwise normal. Recommendation:           - Patient has a contact number available for                            emergencies. The signs and symptoms of potential  delayed complications were discussed with the                            patient. Return to normal activities tomorrow.                            Written discharge instructions were provided to the                            patient.                            - Soft diet.                           - Continue present medications.                           - Resume Eliquis in 3 days                           - Await pathology results.                           - Repeat colonoscopy is recommended for                            surveillance. The colonoscopy date will be                            determined after pathology results from today's                            exam become available for review.                           - No ibuprofen, naproxen, or other non-steroidal                            anti-inflammatory drugs for 2 weeks after polyp                            removal. Remo Lipps P.  MD, MD 06/26/2016 2:09:47 PM This report has been signed electronically.

## 2016-06-26 NOTE — Progress Notes (Signed)
Report given to PACU, vss 

## 2016-06-26 NOTE — Progress Notes (Signed)
Called to room to assist during endoscopic procedure.  Patient ID and intended procedure confirmed with present staff. Received instructions for my participation in the procedure from the performing physician.  Specimens sent rush.

## 2016-06-26 NOTE — Op Note (Addendum)
[facility] Patient Name: Dustin Bauer Procedure Date: 06/26/2016 1:04 PM MRN: [id number] Endoscopist: Remo Lipps P. Lucille Witts MD, MD Age: 71 Referring MD:  Date of Birth: [birth year] Gender: Male Account #: [id number] Procedure:                Upper GI endoscopy Indications:              Epigastric abdominal pain, Heartburn, Vomiting Medicines:                Monitored Anesthesia Care Procedure:                Pre-Anesthesia Assessment:                           - Prior to the procedure, a History and Physical                            was performed, and patient medications and                            allergies were reviewed. The patient's tolerance of                            previous anesthesia was also reviewed. The risks                            and benefits of the procedure and the sedation                            options and risks were discussed with the patient.                            All questions were answered, and informed consent                            was obtained. Prior Anticoagulants: The patient has                            taken Eliquis (apixaban), last dose was 2 days                            prior to procedure. ASA Grade Assessment: III - A                            patient with severe systemic disease. After                            reviewing the risks and benefits, the patient was                            deemed in satisfactory condition to undergo the                            procedure.  After obtaining informed consent, the endoscope was                            passed under direct vision. Throughout the                            procedure, the patient's blood pressure, pulse, and                            oxygen saturations were monitored continuously. The                            Endoscope was introduced through the mouth, and                            advanced to the pylorus. The upper GI  endoscopy was                            accomplished without difficulty. The patient                            tolerated the procedure well. Scope In: Scope Out: Findings:                 Esophagogastric landmarks were identified: the                            Z-line was found at 41 cm, the gastroesophageal                            junction was found at 41 cm and the upper extent of                            the gastric folds was found at 41 cm from the                            incisors.                           LA Grade A (one or more mucosal breaks less than 5                            mm, not extending between tops of 2 mucosal folds)                            esophagitis was found in the distal esophagus.                           The exam of the esophagus was otherwise normal. No                            stenosis / stricture appreciated.  A benign-appearing, intrinsic stenosis was found at                            the pylorus. The lumen was a few mm wide and the                            endoscope could not traverse it. I could also not                            view the lumen distal to it, in this light empiric                            dilation was not performed. Biopsies were taken                            with a cold forceps, placed through the stricture                            to biopsy the bulb and the stricture itself for                            histology.                           The exam of the stomach was otherwise normal. There                            was no retained food in the stomach. Complications:            No immediate complications. Estimated blood loss:                            Minimal. Estimated Blood Loss:     Estimated blood loss was minimal. Impression:               - Esophagogastric landmarks identified.                           - LA Grade A reflux esophagitis.                           - Normal  esophagus otherwise                           - Gastric stenosis was found at the pylorus, not                            able to be traversed. Biopsied. Suprisingly no                            retained food noted in the stomach.                           Overall, suspect patient has partial gastric outlet  obstruction causing symptoms. Recommendation:           - Patient has a contact number available for                            emergencies. The signs and symptoms of potential                            delayed complications were discussed with the                            patient. Return to normal activities tomorrow.                            Written discharge instructions were provided to the                            patient.                           - Soft / liquid diet as tolerated                           - Continue present medications.                           - Start omeprazole 40mg  twice daily                           - NO NSAIDs                           - Await pathology results.                           - CT abdomen / pelvis with oral and IV contrast to                            further evaluate pyloric stenosis Remo Lipps P. Chaniyah Jahr MD, MD 06/26/2016 2:16:30 PM This report has been signed electronically.

## 2016-06-27 ENCOUNTER — Telehealth: Payer: Self-pay

## 2016-06-27 ENCOUNTER — Telehealth: Payer: Self-pay | Admitting: *Deleted

## 2016-06-27 ENCOUNTER — Other Ambulatory Visit: Payer: Self-pay

## 2016-06-27 DIAGNOSIS — K311 Adult hypertrophic pyloric stenosis: Secondary | ICD-10-CM

## 2016-06-27 NOTE — Telephone Encounter (Signed)
-----   Message from Manus Gunning, MD sent at 06/26/2016  5:51 PM EDT ----- Regarding: CT scan Almyra Free this patient had an EGD today showing partial gastric outlet obstruction with pyloric stenosis. I have started him on omeprazole but he needs a CT scan of the abdomen / pelvis with PO and IV contrast to further evaluate length of stricture, and rule out gastric neoplasm.  Can you help coordinate for him? Thanks

## 2016-06-27 NOTE — Telephone Encounter (Signed)
Patient is scheduled for 1st available on 6/18 [facility] arrive at 4:15 for a 4:30 CT scan. Patient has contrast already, he understands to drink one bottle at 2:30, other at 3:30. NPO 4 hours prior to 4:30.

## 2016-06-27 NOTE — Telephone Encounter (Signed)
  Follow up Call-  Call back number 06/26/2016  Post procedure Call Back phone  # [phone] or [phone]  Permission to leave phone message Yes  Some recent data might be hidden     Patient questions:  Do you have a fever, pain , or abdominal swelling? No. Pain Score  0 *  Have you tolerated food without any problems? Yes.    Have you been able to return to your normal activities? Yes.    Do you have any questions about your discharge instructions: Diet   no Medications  No. Follow up visit  Yes.    Do you have questions or concerns about your Care? No.  Actions: * If pain score is 4 or above: No action needed, pain <4.  Pt had several questions about follow up care- all answered and none further at this time

## 2016-06-30 ENCOUNTER — Ambulatory Visit (HOSPITAL_COMMUNITY)
Admission: RE | Admit: 2016-06-30 | Discharge: 2016-06-30 | Disposition: A | Discharge status: Home / Self Care | Payer: Medicare Other | Source: Ambulatory Visit | Attending: Gastroenterology | Admitting: Gastroenterology

## 2016-06-30 DIAGNOSIS — I714 Abdominal aortic aneurysm, without rupture: Secondary | ICD-10-CM | POA: Diagnosis not present

## 2016-06-30 DIAGNOSIS — K311 Adult hypertrophic pyloric stenosis: Secondary | ICD-10-CM | POA: Diagnosis present

## 2016-06-30 DIAGNOSIS — K5710 Diverticulosis of small intestine without perforation or abscess without bleeding: Secondary | ICD-10-CM | POA: Insufficient documentation

## 2016-06-30 MED ORDER — IOPAMIDOL (ISOVUE-300) INJECTION 61%
INTRAVENOUS | Status: AC
  Filled 2016-06-30: qty 100

## 2016-06-30 MED ORDER — IOPAMIDOL (ISOVUE-300) INJECTION 61%
100.0000 mL | Freq: Once | INTRAVENOUS | Status: AC | PRN
  Administered 2016-06-30: 100 mL via INTRAVENOUS

## 2016-07-01 ENCOUNTER — Telehealth: Payer: Self-pay | Admitting: Gastroenterology

## 2016-07-01 NOTE — Telephone Encounter (Signed)
Patient advised that as soon as results have been reviewed we will notify him.

## 2016-07-02 ENCOUNTER — Telehealth: Payer: Self-pay | Admitting: Gastroenterology

## 2016-07-02 ENCOUNTER — Other Ambulatory Visit: Payer: Self-pay

## 2016-07-02 DIAGNOSIS — R935 Abnormal findings on diagnostic imaging of other abdominal regions, including retroperitoneum: Secondary | ICD-10-CM

## 2016-07-02 DIAGNOSIS — R198 Other specified symptoms and signs involving the digestive system and abdomen: Secondary | ICD-10-CM

## 2016-07-02 NOTE — Telephone Encounter (Signed)
Patient was notified - please see pathology results from procedure report with details. Thanks

## 2016-07-02 NOTE — Telephone Encounter (Signed)
I have not called patient. He is anxious to get CT results, but I do not have any information to give the him. I spoke to him about this yesterday when he called wanting his results.

## 2016-07-04 ENCOUNTER — Other Ambulatory Visit: Payer: Self-pay | Admitting: Cardiology

## 2016-07-04 ENCOUNTER — Telehealth: Payer: Self-pay | Admitting: Gastroenterology

## 2016-07-04 NOTE — Telephone Encounter (Signed)
Called back spoke to patient's wife, he is to have nothing by mouth after midnight. He can take his Eliquis the night before as usual.

## 2016-07-07 ENCOUNTER — Ambulatory Visit: Payer: Medicare Other | Admitting: Family

## 2016-07-07 ENCOUNTER — Ambulatory Visit (HOSPITAL_COMMUNITY): Payer: Medicare Other

## 2016-07-07 ENCOUNTER — Ambulatory Visit (HOSPITAL_COMMUNITY)
Admission: RE | Admit: 2016-07-07 | Discharge: 2016-07-07 | Disposition: A | Discharge status: Home / Self Care | Payer: Medicare Other | Source: Ambulatory Visit | Attending: Gastroenterology | Admitting: Gastroenterology

## 2016-07-07 DIAGNOSIS — K209 Esophagitis, unspecified: Secondary | ICD-10-CM | POA: Diagnosis not present

## 2016-07-07 DIAGNOSIS — K5710 Diverticulosis of small intestine without perforation or abscess without bleeding: Secondary | ICD-10-CM | POA: Insufficient documentation

## 2016-07-07 DIAGNOSIS — R198 Other specified symptoms and signs involving the digestive system and abdomen: Secondary | ICD-10-CM | POA: Diagnosis not present

## 2016-07-07 DIAGNOSIS — R935 Abnormal findings on diagnostic imaging of other abdominal regions, including retroperitoneum: Secondary | ICD-10-CM | POA: Diagnosis present

## 2016-07-09 ENCOUNTER — Telehealth: Payer: Self-pay | Admitting: Gastroenterology

## 2016-07-09 NOTE — Telephone Encounter (Signed)
I spoke with Dr. Janeece Fitting about barium study. There does appear to be a pyloric stenosis, he thinks around 3mm long although difficult to say exact length of it. No obvious mass lesion. The intussuseption / ? Polyp noted on CT is not appreciated on this study, only a large diverticulum.  I do think the pyloric stenosis is amenable to balloon dilation, although the exam should be done at the hospital in case I cannot traverse the stenosis again with a regular EGD, they have a pediatric EGD scope available if needed, to ensure we visualize the duodenum this time.   Can you schedule him at the hospital if he is okay proceeding? Last time I spoke with him he was feeling okay in regards to tolerating PO. We can do this during my hospital week in July, or add on and do a 730 AM case or half day case sooner if needed. We can touch base about timing. If he has further questions please let me know. Thanks much

## 2016-07-09 NOTE — Telephone Encounter (Signed)
Please note, he will need to hold Eliquis for 2 days prior to the procedure.  thanks

## 2016-07-10 ENCOUNTER — Other Ambulatory Visit: Payer: Self-pay

## 2016-07-10 DIAGNOSIS — K311 Adult hypertrophic pyloric stenosis: Secondary | ICD-10-CM

## 2016-07-10 NOTE — Telephone Encounter (Signed)
Patient scheduled for 7/19 at [facility]. Patient advised to stop Eliquis 2 days prior, mailed prep instructions.

## 2016-07-14 ENCOUNTER — Other Ambulatory Visit: Payer: Self-pay

## 2016-07-29 ENCOUNTER — Encounter (HOSPITAL_COMMUNITY): Payer: Self-pay | Admitting: *Deleted

## 2016-07-30 ENCOUNTER — Telehealth: Payer: Self-pay | Admitting: Family

## 2016-07-30 NOTE — Telephone Encounter (Signed)
Spoke w/ the patient and relayed the message from Dustin Bauer about cancelling the appt for AAA study. Patient is aware he is to see the NP only and go over the results of the CTA done in June of this year. awt

## 2016-07-30 NOTE — Telephone Encounter (Signed)
-----   Message from Viann Fish, NP sent at 07/29/2016  8:59 PM EDT ----- Regarding: RE: Appointment Question Contact: [phone] Annette, No need for AAA duplex.  CT abd/pelvis with contrast done in June 2018. I will review results with pt at his appointment. Thank you, Vinnie Level  ----- Message ----- From: Lujean Amel Sent: 07/29/2016  10:46 AM To: Sharmon Leyden Nickel, NP Subject: Appointment Question                           Vinnie Level, This patient is scheduled to see you on 08/19/16 with a AAA ultrasound prior. However he had a CTA Abd/Pelvis done at [facility] on 06/30/16 and wants to know if he would need to repeat the ultrasound here since the CTA was done recently. Can you review the CTA and please advise. He does want to come in to see you but doesn't want to have the [place] if not necessary.  I will call the patient back to let him know. Thanks, Anne Ng

## 2016-07-31 ENCOUNTER — Ambulatory Visit (HOSPITAL_COMMUNITY): Payer: Medicare Other | Admitting: Anesthesiology

## 2016-07-31 ENCOUNTER — Ambulatory Visit (HOSPITAL_COMMUNITY)
Admission: RE | Admit: 2016-07-31 | Discharge: 2016-07-31 | Disposition: A | Discharge status: Home / Self Care | Payer: Medicare Other | Source: Ambulatory Visit | Attending: Gastroenterology | Admitting: Gastroenterology

## 2016-07-31 ENCOUNTER — Encounter (HOSPITAL_COMMUNITY)
Admission: RE | Disposition: A | Discharge status: Home / Self Care | Payer: Self-pay | Source: Ambulatory Visit | Attending: Gastroenterology

## 2016-07-31 ENCOUNTER — Encounter (HOSPITAL_COMMUNITY): Payer: Self-pay

## 2016-07-31 DIAGNOSIS — N400 Enlarged prostate without lower urinary tract symptoms: Secondary | ICD-10-CM | POA: Insufficient documentation

## 2016-07-31 DIAGNOSIS — Z79899 Other long term (current) drug therapy: Secondary | ICD-10-CM | POA: Insufficient documentation

## 2016-07-31 DIAGNOSIS — K5730 Diverticulosis of large intestine without perforation or abscess without bleeding: Secondary | ICD-10-CM | POA: Insufficient documentation

## 2016-07-31 DIAGNOSIS — K449 Diaphragmatic hernia without obstruction or gangrene: Secondary | ICD-10-CM | POA: Insufficient documentation

## 2016-07-31 DIAGNOSIS — Z955 Presence of coronary angioplasty implant and graft: Secondary | ICD-10-CM | POA: Insufficient documentation

## 2016-07-31 DIAGNOSIS — Z7902 Long term (current) use of antithrombotics/antiplatelets: Secondary | ICD-10-CM | POA: Diagnosis not present

## 2016-07-31 DIAGNOSIS — I480 Paroxysmal atrial fibrillation: Secondary | ICD-10-CM | POA: Diagnosis not present

## 2016-07-31 DIAGNOSIS — E785 Hyperlipidemia, unspecified: Secondary | ICD-10-CM | POA: Insufficient documentation

## 2016-07-31 DIAGNOSIS — Z86010 Personal history of colonic polyps: Secondary | ICD-10-CM | POA: Insufficient documentation

## 2016-07-31 DIAGNOSIS — I2510 Atherosclerotic heart disease of native coronary artery without angina pectoris: Secondary | ICD-10-CM | POA: Diagnosis not present

## 2016-07-31 DIAGNOSIS — K311 Adult hypertrophic pyloric stenosis: Secondary | ICD-10-CM | POA: Diagnosis present

## 2016-07-31 DIAGNOSIS — Z87891 Personal history of nicotine dependence: Secondary | ICD-10-CM | POA: Insufficient documentation

## 2016-07-31 DIAGNOSIS — K210 Gastro-esophageal reflux disease with esophagitis: Secondary | ICD-10-CM | POA: Diagnosis not present

## 2016-07-31 DIAGNOSIS — I252 Old myocardial infarction: Secondary | ICD-10-CM | POA: Diagnosis not present

## 2016-07-31 HISTORY — DX: Gastro-esophageal reflux disease without esophagitis: K21.9

## 2016-07-31 HISTORY — PX: BALLOON DILATION: SHX5330

## 2016-07-31 HISTORY — DX: Acute myocardial infarction, unspecified: I21.9

## 2016-07-31 HISTORY — PX: ESOPHAGOGASTRODUODENOSCOPY: SHX5428

## 2016-07-31 HISTORY — DX: Adult hypertrophic pyloric stenosis: K31.1

## 2016-07-31 HISTORY — DX: Adverse effect of unspecified anesthetic, initial encounter: T41.45XA

## 2016-07-31 SURGERY — EGD (ESOPHAGOGASTRODUODENOSCOPY)
Anesthesia: Monitor Anesthesia Care

## 2016-07-31 MED ORDER — PROPOFOL 500 MG/50ML IV EMUL
INTRAVENOUS | Status: DC | PRN
  Administered 2016-07-31: 100 ug/kg/min via INTRAVENOUS

## 2016-07-31 MED ORDER — PROPOFOL 10 MG/ML IV BOLUS
INTRAVENOUS | Status: DC | PRN
  Administered 2016-07-31 (×2): 20 mg via INTRAVENOUS

## 2016-07-31 MED ORDER — LIDOCAINE 2% (20 MG/ML) 5 ML SYRINGE
INTRAMUSCULAR | Status: AC
  Filled 2016-07-31: qty 5

## 2016-07-31 MED ORDER — LACTATED RINGERS IV SOLN
INTRAVENOUS | Status: DC
  Administered 2016-07-31: 1000 mL via INTRAVENOUS

## 2016-07-31 MED ORDER — PROPOFOL 10 MG/ML IV BOLUS
INTRAVENOUS | Status: AC
  Filled 2016-07-31: qty 40

## 2016-07-31 MED ORDER — PROPOFOL 10 MG/ML IV BOLUS
INTRAVENOUS | Status: AC
  Filled 2016-07-31: qty 20

## 2016-07-31 MED ORDER — SODIUM CHLORIDE 0.9 % IV SOLN: INTRAVENOUS | Status: DC

## 2016-07-31 NOTE — Discharge Instructions (Signed)
Esophagogastroduodenoscopy °Esophagogastroduodenoscopy (EGD) is a procedure to examine the lining of the esophagus, stomach, and first part of the small intestine (duodenum). This procedure is done to check for problems such as inflammation, bleeding, ulcers, or growths. °During this procedure, a long, flexible, lighted tube with a camera attached (endoscope) is inserted down the throat. °Tell a health care provider about: °· Any allergies you have. °· All medicines you are taking, including vitamins, herbs, eye drops, creams, and over-the-counter medicines. °· Any problems you or family members have had with anesthetic medicines. °· Any blood disorders you have. °· Any surgeries you have had. °· Any medical conditions you have. °· Whether you are pregnant or may be pregnant. °What are the risks? °Generally, this is a safe procedure. However, problems may occur, including: °· Infection. °· Bleeding. °· A tear (perforation) in the esophagus, stomach, or duodenum. °· Trouble breathing. °· Excessive sweating. °· Spasms of the larynx. °· A slowed heartbeat. °· Low blood pressure. ° °What happens before the procedure? °· Follow instructions from your health care provider about eating or drinking restrictions. °· Ask your health care provider about: °? Changing or stopping your regular medicines. This is especially important if you are taking diabetes medicines or blood thinners. °? Taking medicines such as aspirin and ibuprofen. These medicines can thin your blood. Do not take these medicines before your procedure if your health care provider instructs you not to. °· Plan to have someone take you home after the procedure. °· If you wear dentures, be ready to remove them before the procedure. °What happens during the procedure? °· To reduce your risk of infection, your health care team will wash or sanitize their hands. °· An IV tube will be put in a vein in your hand or arm. You will get medicines and fluids through this  tube. °· You will be given one or more of the following: °? A medicine to help you relax (sedative). °? A medicine to numb the area (local anesthetic). This medicine may be sprayed into your throat. It will make you feel more comfortable and keep you from gagging or coughing during the procedure. °? A medicine for pain. °· A mouth guard may be placed in your mouth to protect your teeth and to keep you from biting on the endoscope. °· You will be asked to lie on your left side. °· The endoscope will be lowered down your throat into your esophagus, stomach, and duodenum. °· Air will be put into the endoscope. This will help your health care provider see better. °· The lining of your esophagus, stomach, and duodenum will be examined. °· Your health care provider may: °? Take a tissue sample so it can be looked at in a lab (biopsy). °? Remove growths. °? Remove objects (foreign bodies) that are stuck. °? Treat any bleeding with medicines or other devices that stop tissue from bleeding. °? Widen (dilate) or stretch narrowed areas of your esophagus and stomach. °· The endoscope will be taken out. °The procedure may vary among health care providers and hospitals. °What happens after the procedure? °· Your blood pressure, heart rate, breathing rate, and blood oxygen level will be monitored often until the medicines you were given have worn off. °· Do not eat or drink anything until the numbing medicine has worn off and your gag reflex has returned. °This information is not intended to replace advice given to you by your health care provider. Make sure you discuss any questions you   have with your health care provider. °Document Released: 05/02/2004 Document Revised: 06/07/2015 Document Reviewed: 11/23/2014 °Elsevier Interactive Patient Education © 2018 [employer] Inc. ° °

## 2016-07-31 NOTE — Op Note (Signed)
[facility] Patient Name: Dustin Bauer Procedure Date: 07/31/2016 MRN: [id number] Attending MD: Carlota Raspberry. Rodnesha Elie MD, MD Date of Birth: 03/16/1945 CSN: [id number] Age: 71 Admit Type: Inpatient Procedure:                Upper GI endoscopy Indications:              For therapy of pyloric stenosis - unable to                            traverse on initial EGD, barium study shows short <                            1cm stricture at the pylorus, CT shows no mass                            lesions Providers:                Carlota Raspberry. Bonnetta Allbee MD, MD, Elmer Ramp. Tilden Dome, RN,                            Tinnie Gens, Technician, Danley Danker, CRNA Referring MD:              Medicines:                Monitored Anesthesia Care Complications:            No immediate complications. Estimated blood loss:                            Minimal. Estimated Blood Loss:     Estimated blood loss was minimal. Procedure:                Pre-Anesthesia Assessment:                           - Prior to the procedure, a History and Physical                            was performed, and patient medications and                            allergies were reviewed. The patient's tolerance of                            previous anesthesia was also reviewed. The risks                            and benefits of the procedure and the sedation                            options and risks were discussed with the patient.                            All questions were answered, and informed consent  was obtained. Prior Anticoagulants: The patient has                            taken Eliquis (apixaban), last dose was 3 days                            prior to procedure. ASA Grade Assessment: III - A                            patient with severe systemic disease. After                            reviewing the risks and benefits, the patient was                            deemed in  satisfactory condition to undergo the                            procedure.                           After obtaining informed consent, the endoscope was                            passed under direct vision. Throughout the                            procedure, the patient's blood pressure, pulse, and                            oxygen saturations were monitored continuously. The                            Endoscope was introduced through the mouth, and                            advanced to the second part of duodenum. The upper                            GI endoscopy was accomplished without difficulty.                            The patient tolerated the procedure well. Scope In: Scope Out: Findings:      Esophagogastric landmarks were identified: the Z-line was found at 38       cm, the gastroesophageal junction was found at 38 cm and the upper       extent of the gastric folds was found at 40 cm from the incisors.      A 2 cm hiatal hernia was present.      The exam of the esophagus was otherwise normal.      A medium amount of food residue was found in the gastric body,       prohibiting views of the gastric body and fundus.      A benign-appearing, intrinsic severe  stenosis was found at the pylorus.       The lumen was roughly 2-28m wide at largest, the scope could not       traverse it. A TTS dilator was passed through the scope. Dilation with a       172m 1151mand 12 mm balloon was initially done with mucosal wrent but       the endoscope still could not traverse it. A 15-16.5-18 mm pyloric       balloon was placed and dilation was performed at 13m20mucosal wrents       were noted again and the stricture could then be traversed. Biopsies       were taken with a cold forceps for histology. It appeared benign      The duodenal bulb and second portion of the duodenum were normal. Impression:               - Esophagogastric landmarks identified.                           - 2 cm  hiatal hernia.                           - Normal esophagus                           - A medium amount of food (residue) in the stomach                            due to partial gastric outlet obstruction.                           - Severe gastric stenosis was found at the pylorus.                            Dilated to 13mm23mh good response. Biopsied.                           - Normal duodenal bulb and second portion of the                            duodenum. Moderate Sedation:      No moderate sedation, case performed with MAC Recommendation:           - Patient has a contact number available for                            emergencies. The signs and symptoms of potential                            delayed complications were discussed with the                            patient. Return to normal activities tomorrow.                            Written discharge instructions were provided to the  patient.                           - Resume previous diet (soft diet)                           - Continue present medications.                           - Resume Eliquis in 2 days                           - Resume omeprazole                           - Await pathology results.                           - Repeat upper endoscopy in 4 weeks for further                            dilation. If the lumen cannot be maintained despite                            larger dilation then will need to consider advanced                            endoscopy evaluation for stenting. Procedure Code(s):        --- Professional ---                           [phone], Esophagogastroduodenoscopy, flexible,                            transoral; with dilation of gastric/duodenal                            stricture(s) (eg, balloon, bougie)                           43239, Esophagogastroduodenoscopy, flexible,                            transoral; with biopsy, single or multiple Diagnosis  Code(s):        --- Professional ---                           K44.9, Diaphragmatic hernia without obstruction or                            gangrene                           K31.1, Adult hypertrophic pyloric stenosis CPT copyright 2016 American Medical Association. All rights reserved. The codes documented in this report are preliminary and upon coder review may  be revised to meet current compliance requirements. Remo Lipps P. Shila Kruczek MD, MD 07/31/2016 10:09:58 AM This report  has been signed electronically. Number of Addenda: 0

## 2016-07-31 NOTE — Transfer of Care (Signed)
Immediate Anesthesia Transfer of Care Note  Patient: Dustin Bauer  Procedure(s) Performed: Procedure(s): ESOPHAGOGASTRODUODENOSCOPY (EGD) also have pediatric scope available (N/A) BALLOON DILATION (N/A)  Patient Location: Endoscopy Unit  Anesthesia Type:MAC  Level of Consciousness: awake  Airway & Oxygen Therapy: Patient Spontanous Breathing and Patient connected to nasal cannula oxygen  Post-op Assessment: Report given to RN and Post -op Vital signs reviewed and stable  Post vital signs: Reviewed and stable  Last Vitals:  Vitals:   07/31/16 0815  BP: 110/88  Pulse: (!) 41  Resp: 18  Temp: (!) 36.3 C    Last Pain:  Vitals:   07/31/16 0815  TempSrc: Oral         Complications: No apparent anesthesia complications

## 2016-07-31 NOTE — Anesthesia Procedure Notes (Signed)
Date/Time: 07/31/2016 9:31 AM Performed by: Danley Danker L Oxygen Delivery Method: Nasal cannula

## 2016-07-31 NOTE — H&P (Signed)
HPI :  71 y/o male with partial gastric outlet obstruction for pyloric stenosis / stricture, her for EGD with dilation of the stricture. He is taking Eliquis, last dose > 2 days ago. He states he has been eating well without complaints recently.   Past Medical History:  Diagnosis Date  . Allergy to ACE inhibitors    Cough and fatigue with ACEI  . CAD (coronary artery disease)    a. LHC in 2011 for stable angina >> DES to pLAD [facility])  //  b. inf-post STEMI 7/15 c/b CHB with jxn escape >> LHC: OM1 60-70, pLAD 30-40, m-d RCA occluded, EF 50-55 >> PCI: 3 x 33 mm Xience DES to RCA  //  c. LHC 8/17: dLM 20, pLAD 20, pLAD stent ok, pRI 30, pRCA stent ok with 30% ISR  . Colon polyps   . Complication of anesthesia    no memories from right after last colonscopy/endoscopy may 2018  . GERD (gastroesophageal reflux disease)   . History of echocardiogram    Echo 12/15:  Mild LVH, EF 60-65%, normal wall motion, grade 1 diastolic dysfunction, LA upper limits of normal, mild RAE  . Hydrocele, left   . Hyperlipidemia    Myalgias with Zocor, Crestor, atorvastatin.   Marland Kitchen Hyperplasia of prostate   . Myocardial infarction ([place]) 2014  . Nicotine abuse   . PAF (paroxysmal atrial fibrillation) [facility]    s/p ablation by Dr Roxan Hockey at [facility] in 2012 // Xarelto anticoagulation  //  ILR removed 2/17 // Event Monitor 11/17: Predominantly NSR (90%). Paroxysmal atrial fibrillation (10%).  Atrial fibrillation appears to be rate-controlled when it occurs.   . Pyloric stenosis      Past Surgical History:  Procedure Laterality Date  . ATRIAL FIBRILLATION ABLATION  07/2010   Afib ablation by Dr Roxan Hockey at [facility] in 2012  . CARDIAC CATHETERIZATION    . CARDIAC CATHETERIZATION N/A 08/17/2015   Procedure: Left Heart Cath and Coronary Angiography;  Surgeon: Larey Dresser, MD;  Location: [place] CV LAB;  Service: Cardiovascular;  Laterality: N/A;  . colonscopy and endoscopy  05/2016  . CORONARY STENT  PLACEMENT     x 2  . EP IMPLANTABLE DEVICE N/A 03/09/2015   Procedure: Loop Recorder Removal;  Surgeon: [name], MD;  Location: [place] CV LAB;  Service: Cardiovascular;  Laterality: N/A;  . implantable loop recorder implantation  08/13/2010   MDT Reveal XT implanted by Dr Roxan Hockey for afib management post ablation  . LEFT HEART CATH N/A 07/15/2013   Procedure: LEFT HEART CATH;  Surgeon: Clent Demark, MD;  Location: [facility] CATH LAB;  Service: Cardiovascular;  Laterality: N/A;  . TONSILLECTOMY AND ADENOIDECTOMY     Family History  Problem Relation Age of Onset  . Dementia Mother   . Heart disease Father   . Hyperlipidemia Father   . Colon cancer Paternal Grandmother   . Cancer Sister        pancreatic   Social History  Substance Use Topics  . Smoking status: Former Smoker    Packs/day: 0.25    Years: 30.00    Types: Cigarettes    Quit date: 07/21/2003  . Smokeless tobacco: Never Used  . Alcohol use 0.6 oz/week    1 Glasses of wine per week     Comment: occasional wine   Current Facility-Administered Medications  Medication Dose Route Frequency Provider Last Rate Last Dose  . 0.9 %  sodium chloride infusion   Intravenous Continuous  Natasha Burda, Renelda Loma, MD      . lactated ringers infusion   Intravenous Continuous Geordan Xu, Renelda Loma, MD 10 mL/hr at 07/31/16 0831 1,000 mL at 07/31/16 0831   Allergies  Allergen Reactions  . Clopidogrel Other (See Comments)    unknown reaction  . Crestor [Rosuvastatin Calcium] Other (See Comments)    Extreme muscular weakness  . Lipitor [Atorvastatin Calcium] Other (See Comments)    Extreme muscular weakness   . Statins Other (See Comments)    Extreme muscular weakness      Review of Systems: All systems reviewed and negative except where noted in HPI.    Dg Ugi W/high Density W/kub  Result Date: 07/07/2016 CLINICAL DATA:  Nausea and vomiting.  Epigastric obstruction. EXAM: UPPER GI SERIES WITH KUB TECHNIQUE: After  obtaining a scout radiograph a routine upper GI series was performed using thin and high density barium. FLUOROSCOPY TIME:  Fluoroscopy Time:  5 minutes, 0 seconds Radiation Exposure Index (if provided by the fluoroscopic device): 71.6 mGy Number of Acquired Spot Images: 4 COMPARISON:  06/30/2016 CT scan FINDINGS: Initial KUB demonstrates lumbar spondylosis but unremarkable bowel gas pattern. The pharyngeal phase of swallowing was not assessed. Distal esophageal fold thickening is suggested on mucosal relief assessment of the distal esophagus, favoring esophagitis. No obvious esophageal stricture. Emptying of the stomach was very sluggish. The cause is difficult to ascertain. The pyloric channel does not appear particularly wide but does appear somewhat narrow, with poor filling of the pyloric channel. I do not see a definite mass lesion, nor was there a visible mass on CT in this vicinity. The duodenal bulb appears relatively unremarkable. There is a diverticulum of the third part of the duodenum which is moderate in size. The duodenum appears otherwise normal. No current findings of intussusception. IMPRESSION: 1. Radiography confirms sluggish emptying of the stomach due to narrowing of the pyloric channel. No definite mass or ulcer is currently seen. This may reflect postinflammatory stricturing. 2. Distal esophagitis. 3. Diverticulum of the transverse duodenum. Electronically Signed   By: Van Clines M.D.   On: 07/07/2016 11:44    Lab Results  Component Value Date   WBC 6.2 06/11/2016   HGB 13.7 06/11/2016   HCT 41.8 06/11/2016   MCV 94 06/11/2016   PLT 196 06/11/2016     Lab Results  Component Value Date   ALT 12 06/11/2016   AST 17 06/11/2016   ALKPHOS 84 06/11/2016   BILITOT 0.4 06/11/2016    Lab Results  Component Value Date   CREATININE 1.04 06/11/2016   BUN 18 06/11/2016   NA 142 06/11/2016   K 4.7 06/11/2016   CL 106 06/11/2016   CO2 24 06/11/2016     Physical  Exam: BP 110/88   Pulse (!) 41   Temp (!) 97.4 F (36.3 C) (Oral)   Resp 18   Ht 5\' 10"  (1.778 m)   Wt 162 lb (73.5 kg)   SpO2 100%   BMI 23.24 kg/m  Constitutional: Pleasant,male in no acute distress. Cardiovascular: Normal rate, regular rhythm.  Pulmonary/chest: Effort normal and breath sounds normal. No wheezing, rales or rhonchi. Abdominal: Soft, nondistended, nontender. There are no masses palpable. No hepatomegaly. Extremities: no edema Lymphadenopathy: No cervical adenopathy noted. Neurological: Alert and oriented to person place and time. Skin: Skin is warm and dry. No rashes noted. Psychiatric: Normal mood and affect. Behavior is normal.   ASSESSMENT AND PLAN: 71 y/o male with partial gastric outlet obstruction, here for EGD  with likely dilation and further evaluation. I have discussed risks / benefits and he wishes to proceed. Last dose Eliquis > 2 days ago.    Cellar, MD [facility] Pager [phone]

## 2016-07-31 NOTE — Anesthesia Postprocedure Evaluation (Signed)
Anesthesia Post Note  Patient: KC SUMMERSON  Procedure(s) Performed: Procedure(s) (LRB): ESOPHAGOGASTRODUODENOSCOPY (EGD) also have pediatric scope available (N/A) BALLOON DILATION (N/A)     Patient location during evaluation: PACU Anesthesia Type: MAC Level of consciousness: awake and alert Pain management: pain level controlled Vital Signs Assessment: post-procedure vital signs reviewed and stable Respiratory status: spontaneous breathing, nonlabored ventilation, respiratory function stable and patient connected to nasal cannula oxygen Cardiovascular status: stable and blood pressure returned to baseline Anesthetic complications: no    Last Vitals:  Vitals:   07/31/16 1011 07/31/16 1030  BP: (!) 87/58 105/75  Pulse: (!) 114 (!) 50  Resp: 14 17  Temp:      Last Pain:  Vitals:   07/31/16 1020  TempSrc: Oral                 Dashawna Delbridge S

## 2016-07-31 NOTE — Interval H&P Note (Signed)
History and Physical Interval Note:  07/31/2016 9:25 AM  Dustin Bauer  has presented today for surgery, with the diagnosis of pyloric stenosis  The various methods of treatment have been discussed with the patient and family. After consideration of risks, benefits and other options for treatment, the patient has consented to  Procedure(s): ESOPHAGOGASTRODUODENOSCOPY (EGD) also have pediatric scope available (N/A) BALLOON DILATION (N/A) as a surgical intervention .  The patient's history has been reviewed, patient examined, no change in status, stable for surgery.  I have reviewed the patient's chart and labs.  Questions were answered to the patient's satisfaction.     [name]

## 2016-07-31 NOTE — Anesthesia Preprocedure Evaluation (Signed)
Anesthesia Evaluation  Patient identified by MRN, date of birth, ID band Patient awake    Reviewed: Allergy & Precautions, H&P , NPO status , Patient's Chart, lab work & pertinent test results  Airway Mallampati: II   Neck ROM: full    Dental   Pulmonary former smoker,    breath sounds clear to auscultation       Cardiovascular + CAD, + Past MI and + Cardiac Stents  + dysrhythmias Atrial Fibrillation  Rhythm:regular Rate:Normal     Neuro/Psych    GI/Hepatic GERD  ,  Endo/Other    Renal/GU      Musculoskeletal   Abdominal   Peds  Hematology   Anesthesia Other Findings   Reproductive/Obstetrics                             Anesthesia Physical Anesthesia Plan  ASA: III  Anesthesia Plan: MAC   Post-op Pain Management:    Induction: Intravenous  PONV Risk Score and Plan: 1 and Ondansetron, Dexamethasone, Propofol and Treatment may vary due to age or medical condition  Airway Management Planned: Nasal Cannula  Additional Equipment:   Intra-op Plan:   Post-operative Plan:   Informed Consent: I have reviewed the patients History and Physical, chart, labs and discussed the procedure including the risks, benefits and alternatives for the proposed anesthesia with the patient or authorized representative who has indicated his/her understanding and acceptance.     Plan Discussed with: CRNA and Anesthesiologist  Anesthesia Plan Comments:         Anesthesia Quick Evaluation

## 2016-08-01 ENCOUNTER — Encounter (HOSPITAL_COMMUNITY): Payer: Self-pay | Admitting: Gastroenterology

## 2016-08-04 ENCOUNTER — Encounter: Payer: Self-pay | Admitting: Family

## 2016-08-05 ENCOUNTER — Other Ambulatory Visit: Payer: Self-pay | Admitting: Cardiology

## 2016-08-05 ENCOUNTER — Other Ambulatory Visit (HOSPITAL_COMMUNITY): Payer: Self-pay | Admitting: Nurse Practitioner

## 2016-08-08 ENCOUNTER — Telehealth: Payer: Self-pay

## 2016-08-08 NOTE — Telephone Encounter (Signed)
Patient with CHADS2 score of 1 (hypertension).  Is having endoscopy in August and would like to hold Eliquis x 2 days prior.  Patient states this is what he was told the last 3 times he had endoscopies.    Per office protocol, okay to hold Eliquis for 2 days prior.  Patient voiced understanding.

## 2016-08-08 NOTE — Telephone Encounter (Signed)
Patient is on Eliquis 5 mg, asking to see if it is okay to hold it 2 days prior to his next EGD with dilation that is scheduled for 08/27/16 at [facility] endoscopy.

## 2016-08-10 ENCOUNTER — Other Ambulatory Visit: Payer: Self-pay | Admitting: Cardiology

## 2016-08-11 ENCOUNTER — Other Ambulatory Visit: Payer: Self-pay

## 2016-08-11 DIAGNOSIS — R1310 Dysphagia, unspecified: Secondary | ICD-10-CM

## 2016-08-11 DIAGNOSIS — R1319 Other dysphagia: Secondary | ICD-10-CM

## 2016-08-19 ENCOUNTER — Encounter: Payer: Self-pay | Admitting: Family

## 2016-08-19 ENCOUNTER — Ambulatory Visit (INDEPENDENT_AMBULATORY_CARE_PROVIDER_SITE_OTHER): Payer: Medicare Other | Admitting: Family

## 2016-08-19 ENCOUNTER — Other Ambulatory Visit (HOSPITAL_COMMUNITY): Payer: Medicare Other

## 2016-08-19 VITALS — BP 132/79 | HR 45 | Temp 97.0°F | Resp 16 | Ht 70.0 in | Wt 161.0 lb

## 2016-08-19 DIAGNOSIS — R0989 Other specified symptoms and signs involving the circulatory and respiratory systems: Secondary | ICD-10-CM | POA: Diagnosis not present

## 2016-08-19 DIAGNOSIS — I714 Abdominal aortic aneurysm, without rupture, unspecified: Secondary | ICD-10-CM

## 2016-08-19 NOTE — Progress Notes (Signed)
[facility] [place]   CC: Follow up Abdominal Aortic Aneurysm  History of Present Illness  Dustin Bauer is a 71 y.o. (1945/04/13) male who was referred to Dr. Trula Slade by Dr. Laurance Flatten for evaluation of an abdominal aortic aneurysm.  This was detected on a CT scan for unexpected weight loss.  This showed a 3.9 cm infrarenal abdominal aortic aneurysm.  The patient denies any abdominal pain or back pain.  Dr. Trula Slade evaluated pt initially on 07-02-15. At that time he reviewed pt's CT scan which revealed a 3.9 cm infrarenal abdominal aortic aneurysm Dr. Trula Slade discussed with the pt the signs and symptoms of rupture as well as the indications for repair, and recommended continued surveillance of his aneurysm with the next study being in 1 year.   Patient has a history of hypertension which is well-controlled.  He suffers from hypercholesterolemia but is statin intolerant secondary to extreme muscle weakness. He has a history of tobacco abuse and is undergoing workup for chronic cough.  He is on anticoagulation for atrial fibrillation he has a history of coronary stenting. Pt had a CTA of his abdomen/pelvis on 06-30-16 requested by Dr. Havery Moros as part of an evaluation of pyloric stenosis for partial gastric outlet obstruction seen on endoscopy. Nausea, vomiting, upper abdominal discomfort. The CTA also demonstrated his AAA, 3.7 cm infrarenal abdominal aortic aneurysm, stable. Moderate calcified and noncalcified plaque in the infrarenal aorta. The AAA measured 3.9 cm on the CTA abd/pelvis on 06-08-15.  Therefore no AAA duplex was necessary today.   Pt states there have been so far unsuccessful attempts to dilate his pyloric stenosis.  He states when his stomach won't empty he vomits, but otherwise denies abdominal pain or back pain.   The patient denies claudication in legs with walking. The patient denies history of stroke or TIA symptoms. He denies tingling, numbness, weakness,  or pain in either upper extremity.   Pt Diabetic: No Pt smoker: former smoker, quit in 1992, started at age 57 years   He is taking Eliquis for atrial fib, managed by his cardiologist, Dr. Aundra Dubin.    Past Medical History:  Diagnosis Date  . Allergy to ACE inhibitors    Cough and fatigue with ACEI  . CAD (coronary artery disease)    a. LHC in 2011 for stable angina >> DES to pLAD [facility])  //  b. inf-post STEMI 7/15 c/b CHB with jxn escape >> LHC: OM1 60-70, pLAD 30-40, m-d RCA occluded, EF 50-55 >> PCI: 3 x 33 mm Xience DES to RCA  //  c. LHC 8/17: dLM 20, pLAD 20, pLAD stent ok, pRI 30, pRCA stent ok with 30% ISR  . Colon polyps   . Complication of anesthesia    no memories from right after last colonscopy/endoscopy may 2018  . GERD (gastroesophageal reflux disease)   . History of echocardiogram    Echo 12/15:  Mild LVH, EF 60-65%, normal wall motion, grade 1 diastolic dysfunction, LA upper limits of normal, mild RAE  . Hydrocele, left   . Hyperlipidemia    Myalgias with Zocor, Crestor, atorvastatin.   Marland Kitchen Hyperplasia of prostate   . Myocardial infarction ([place]) 2014  . Nicotine abuse   . PAF (paroxysmal atrial fibrillation) [facility])    s/p ablation by Dr Roxan Hockey at [facility] in 2012 // Xarelto anticoagulation  //  ILR removed 2/17 // Event Monitor 11/17: Predominantly NSR (90%). Paroxysmal atrial fibrillation (10%).  Atrial fibrillation appears to be rate-controlled when it  occurs.   . Pyloric stenosis    Past Surgical History:  Procedure Laterality Date  . ATRIAL FIBRILLATION ABLATION  07/2010   Afib ablation by Dr Roxan Hockey at [facility] in 2012  . BALLOON DILATION N/A 07/31/2016   Procedure: BALLOON DILATION;  Surgeon: Manus Gunning, MD;  Location: [facility];  Service: Gastroenterology;  Laterality: N/A;  . CARDIAC CATHETERIZATION    . CARDIAC CATHETERIZATION N/A 08/17/2015   Procedure: Left Heart Cath and Coronary Angiography;  Surgeon: Larey Dresser, MD;  Location: [place] CV LAB;  Service: Cardiovascular;  Laterality: N/A;  . colonscopy and endoscopy  05/2016  . CORONARY STENT PLACEMENT     x 2  . EP IMPLANTABLE DEVICE N/A 03/09/2015   Procedure: Loop Recorder Removal;  Surgeon: [name], MD;  Location: [place] CV LAB;  Service: Cardiovascular;  Laterality: N/A;  . ESOPHAGOGASTRODUODENOSCOPY N/A 07/31/2016   Procedure: ESOPHAGOGASTRODUODENOSCOPY (EGD) also have pediatric scope available;  Surgeon: Manus Gunning, MD;  Location: [facility] ENDOSCOPY;  Service: Gastroenterology;  Laterality: N/A;  . implantable loop recorder implantation  08/13/2010   MDT Reveal XT implanted by Dr Roxan Hockey for afib management post ablation  . LEFT HEART CATH N/A 07/15/2013   Procedure: LEFT HEART CATH;  Surgeon: Clent Demark, MD;  Location: [facility] CATH LAB;  Service: Cardiovascular;  Laterality: N/A;  . TONSILLECTOMY AND ADENOIDECTOMY     Social History Social History   Social History  . Marital status: Married    Spouse name: N/A  . Number of children: 1  . Years of education: N/A   Occupational History  . retired    Social History Main Topics  . Smoking status: Former Smoker    Packs/day: 0.25    Years: 30.00    Types: Cigarettes    Quit date: 07/21/2003  . Smokeless tobacco: Never Used  . Alcohol use 0.6 oz/week    1 Glasses of wine per week     Comment: occasional wine  . Drug use: No  . Sexual activity: Yes   Other Topics Concern  . Not on file   Social History Narrative   Pt lives in [place] with wife.  Retired from [employer].  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   Family History Family History  Problem Relation Age of Onset  . Dementia Mother   . Heart disease Father   . Hyperlipidemia Father   . Colon cancer Paternal Grandmother   . Cancer Sister        pancreatic    Current Outpatient Prescriptions on File Prior to Visit  Medication Sig Dispense Refill  . acetaminophen (TYLENOL) 325 MG  tablet Take 325 mg by mouth every 6 (six) hours as needed (FOR PAIN.).    Marland Kitchen Cholecalciferol (SM VITAMIN D3) 4000 units CAPS Take 4,000 Units by mouth daily.    Marland Kitchen ELIQUIS 5 MG TABS tablet TAKE 1 TABLET BY MOUTH TWICE A DAY 60 tablet 3  . ketoconazole (NIZORAL) 2 % cream Apply 1 application topically daily as needed for irritation. 15 g 1  . Melatonin 3 MG TABS Take 3 mg by mouth at bedtime.    Marland Kitchen omeprazole (PRILOSEC) 40 MG capsule Take 1 capsule (40 mg total) by mouth 2 (two) times daily before a meal. 60 capsule 3  . PRALUENT 75 MG/ML SOPN INJECT 75MG  SUBCUTANEOUSLY EVERY 2 WEEKS 2 pen 6  . Resveratrol 250 MG CAPS Take 250 mg by mouth 3 (three) times a  week.     Marland Kitchen XARELTO 20 MG TABS tablet TAKE 1 TABLET BY MOUTH EVERY DAY WITH SUPPER 90 tablet 1   No current facility-administered medications on file prior to visit.    Allergies  Allergen Reactions  . Clopidogrel Other (See Comments)    unknown reaction  . Crestor [Rosuvastatin Calcium] Other (See Comments)    Extreme muscular weakness  . Lipitor [Atorvastatin Calcium] Other (See Comments)    Extreme muscular weakness   . Statins Other (See Comments)    Extreme muscular weakness     ROS: See HPI for pertinent positives and negatives.  Physical Examination  Vitals:   08/19/16 0925 08/19/16 0926  BP: (!) 152/84 132/79  Pulse: (!) 45   Resp: 16   Temp: (!) 97 F (36.1 C)   TempSrc: Oral   SpO2: 99%   Weight: 161 lb (73 kg)   Height: 5\' 10"  (1.778 m)    Body mass index is 23.1 kg/m.  General: A&O x 3, WD, fit appearing male.  Pulmonary: Sym exp, respirations are non labored, good air movt, CTAB, no rales, rhonchi, or wheezing.  Cardiac: RRR, Nl S1, S2, no detected murmur.   Carotid Bruits Right Left   Negative Negative    Adominal aortic pulse is moderately palpable Radial pulses: right is not palpable, left is 2+ palpable; right brachial pulse is 2+ palpable.                           VASCULAR EXAM:                                                                                                          LE Pulses Right Left       FEMORAL  2+ palpable  2+ palpable        POPLITEAL  3+ palpable   3+ palpable       POSTERIOR TIBIAL  2+ palpable   2+ palpable        DORSALIS PEDIS      ANTERIOR TIBIAL faintly palpable  not palpable      Gastrointestinal: soft, NTND, -G/R, - HSM, - masses palpated, - CVAT B.  Musculoskeletal: M/S 5/5 throughout, Extremities without ischemic changes.  Neurologic: CN 2-12 intact, Pain and light touch intact in extremities are intact, Motor exam as listed above.  Non-Invasive Vascular Imaging  AAA Duplex (08/19/2016)  Previous size:  3.9 cm, CTA abd/pelvis on 06-08-15.   Current size: 3.7 cm infrarenal abdominal aortic aneurysm, CTA  abdomen/pelvis on 06-30-16.  Medical Decision Making  The patient is a 71 y.o. male who presents with asymptomatic small AAA with no increase in size. He has prominent bilateral popliteal pulses with no claudication, see Plan.    Based on this patient's exam and diagnostic studies, the patient will follow up in 1 year  with the following studies: AAA duplex and bilateral popliteal artery duplex.  Consideration for repair of AAA would be made when the size is 5.0 cm, growth > 1  cm/yr, and symptomatic status.        The patient was given information about AAA including signs, symptoms, treatment,  and how to minimize the risk of enlargement and rupture of aneurysms.    I emphasized the importance of maximal medical management including strict control of blood pressure, blood glucose, and lipid levels, antiplatelet agents, obtaining regular exercise, and continued cessation of smoking.   The patient was advised to call 911 should the patient experience sudden onset abdominal or back pain.   Thank you for allowing [place] to participate in this patient's care.  Clemon Chambers, RN, MSN, FNP-C [facility]  [place] Office: [phone]  Clinic Physician: Early  08/19/2016, 10:12 AM

## 2016-08-19 NOTE — Patient Instructions (Signed)

## 2016-08-20 NOTE — Addendum Note (Signed)
Addended by: Lianne Cure A on: 08/20/2016 10:24 AM   Modules accepted: Orders

## 2016-08-21 ENCOUNTER — Encounter (HOSPITAL_COMMUNITY): Payer: Self-pay | Admitting: *Deleted

## 2016-08-27 ENCOUNTER — Ambulatory Visit (HOSPITAL_COMMUNITY)
Admission: RE | Admit: 2016-08-27 | Discharge: 2016-08-27 | Disposition: A | Discharge status: Home / Self Care | Payer: Medicare Other | Source: Ambulatory Visit | Attending: Gastroenterology | Admitting: Gastroenterology

## 2016-08-27 ENCOUNTER — Encounter (HOSPITAL_COMMUNITY): Payer: Self-pay | Admitting: Certified Registered Nurse Anesthetist

## 2016-08-27 ENCOUNTER — Ambulatory Visit (HOSPITAL_COMMUNITY): Payer: Medicare Other | Admitting: Anesthesiology

## 2016-08-27 ENCOUNTER — Encounter (HOSPITAL_COMMUNITY)
Admission: RE | Disposition: A | Discharge status: Home / Self Care | Payer: Self-pay | Source: Ambulatory Visit | Attending: Gastroenterology

## 2016-08-27 DIAGNOSIS — E785 Hyperlipidemia, unspecified: Secondary | ICD-10-CM | POA: Diagnosis not present

## 2016-08-27 DIAGNOSIS — Z888 Allergy status to other drugs, medicaments and biological substances status: Secondary | ICD-10-CM | POA: Insufficient documentation

## 2016-08-27 DIAGNOSIS — I2510 Atherosclerotic heart disease of native coronary artery without angina pectoris: Secondary | ICD-10-CM | POA: Insufficient documentation

## 2016-08-27 DIAGNOSIS — K449 Diaphragmatic hernia without obstruction or gangrene: Secondary | ICD-10-CM

## 2016-08-27 DIAGNOSIS — R1310 Dysphagia, unspecified: Secondary | ICD-10-CM

## 2016-08-27 DIAGNOSIS — R1319 Other dysphagia: Secondary | ICD-10-CM

## 2016-08-27 DIAGNOSIS — Z8249 Family history of ischemic heart disease and other diseases of the circulatory system: Secondary | ICD-10-CM | POA: Insufficient documentation

## 2016-08-27 DIAGNOSIS — Z820 Family history of epilepsy and other diseases of the nervous system: Secondary | ICD-10-CM | POA: Diagnosis not present

## 2016-08-27 DIAGNOSIS — I252 Old myocardial infarction: Secondary | ICD-10-CM | POA: Insufficient documentation

## 2016-08-27 DIAGNOSIS — I480 Paroxysmal atrial fibrillation: Secondary | ICD-10-CM | POA: Diagnosis not present

## 2016-08-27 DIAGNOSIS — Z87891 Personal history of nicotine dependence: Secondary | ICD-10-CM | POA: Insufficient documentation

## 2016-08-27 DIAGNOSIS — Z86010 Personal history of colonic polyps: Secondary | ICD-10-CM | POA: Diagnosis not present

## 2016-08-27 DIAGNOSIS — N400 Enlarged prostate without lower urinary tract symptoms: Secondary | ICD-10-CM | POA: Insufficient documentation

## 2016-08-27 DIAGNOSIS — Z955 Presence of coronary angioplasty implant and graft: Secondary | ICD-10-CM | POA: Diagnosis not present

## 2016-08-27 DIAGNOSIS — Z800 Family history of malignant neoplasm of digestive organs: Secondary | ICD-10-CM | POA: Insufficient documentation

## 2016-08-27 DIAGNOSIS — K219 Gastro-esophageal reflux disease without esophagitis: Secondary | ICD-10-CM | POA: Diagnosis not present

## 2016-08-27 DIAGNOSIS — K311 Adult hypertrophic pyloric stenosis: Secondary | ICD-10-CM | POA: Diagnosis present

## 2016-08-27 DIAGNOSIS — K3189 Other diseases of stomach and duodenum: Secondary | ICD-10-CM

## 2016-08-27 DIAGNOSIS — N433 Hydrocele, unspecified: Secondary | ICD-10-CM | POA: Insufficient documentation

## 2016-08-27 HISTORY — PX: BALLOON DILATION: SHX5330

## 2016-08-27 HISTORY — PX: ESOPHAGOGASTRODUODENOSCOPY: SHX5428

## 2016-08-27 HISTORY — DX: Other diseases of stomach and duodenum: K31.89

## 2016-08-27 SURGERY — EGD (ESOPHAGOGASTRODUODENOSCOPY)
Anesthesia: Monitor Anesthesia Care

## 2016-08-27 MED ORDER — LACTATED RINGERS IV SOLN
INTRAVENOUS | Status: DC
  Administered 2016-08-27 (×2): via INTRAVENOUS

## 2016-08-27 MED ORDER — PROPOFOL 500 MG/50ML IV EMUL
INTRAVENOUS | Status: DC | PRN
  Administered 2016-08-27: 125 ug/kg/min via INTRAVENOUS

## 2016-08-27 MED ORDER — SODIUM CHLORIDE 0.9 % IV SOLN: INTRAVENOUS | Status: DC

## 2016-08-27 MED ORDER — PROPOFOL 10 MG/ML IV BOLUS
INTRAVENOUS | Status: DC | PRN
  Administered 2016-08-27: 50 mg via INTRAVENOUS

## 2016-08-27 MED ORDER — PROPOFOL 10 MG/ML IV BOLUS
INTRAVENOUS | Status: AC
  Filled 2016-08-27: qty 60

## 2016-08-27 MED ORDER — LIDOCAINE 2% (20 MG/ML) 5 ML SYRINGE
INTRAMUSCULAR | Status: AC
  Filled 2016-08-27: qty 5

## 2016-08-27 MED ORDER — LIDOCAINE HCL (CARDIAC) 20 MG/ML IV SOLN
INTRAVENOUS | Status: DC | PRN
  Administered 2016-08-27: 100 mg via INTRATRACHEAL

## 2016-08-27 NOTE — Discharge Instructions (Signed)
YOU HAD AN ENDOSCOPIC PROCEDURE TODAY: Refer to the procedure report and other information in the discharge instructions given to you for any specific questions about what was found during the examination. If this information does not answer your questions, please call [place] office at [phone] to clarify.  ° °YOU SHOULD EXPECT: Some feelings of bloating in the abdomen. Passage of more gas than usual. Walking can help get rid of the air that was put into your GI tract during the procedure and reduce the bloating. If you had a lower endoscopy (such as a colonoscopy or flexible sigmoidoscopy) you may notice spotting of blood in your stool or on the toilet paper. Some abdominal soreness may be present for a day or two, also. ° °DIET: Your first meal following the procedure should be a light meal and then it is ok to progress to your normal diet. A half-sandwich or bowl of soup is an example of a good first meal. Heavy or fried foods are harder to digest and may make you feel nauseous or bloated. Drink plenty of fluids but you should avoid alcoholic beverages for 24 hours. If you had a esophageal dilation, please see attached instructions for diet.   ° °ACTIVITY: Your care partner should take you home directly after the procedure. You should plan to take it easy, moving slowly for the rest of the day. You can resume normal activity the day after the procedure however YOU SHOULD NOT DRIVE, use power tools, machinery or perform tasks that involve climbing or major physical exertion for 24 hours (because of the sedation medicines used during the test).  ° °SYMPTOMS TO REPORT IMMEDIATELY: °A gastroenterologist can be reached at any hour. Please call [phone]  for any of the following symptoms:  °Following lower endoscopy (colonoscopy, flexible sigmoidoscopy) °Excessive amounts of blood in the stool  °Significant tenderness, worsening of abdominal pains  °Swelling of the abdomen that is new, acute  °Fever of 100° or  higher  °Following upper endoscopy (EGD, EUS, ERCP, esophageal dilation) °Vomiting of blood or coffee ground material  °New, significant abdominal pain  °New, significant chest pain or pain under the shoulder blades  °Painful or persistently difficult swallowing  °New shortness of breath  °Black, tarry-looking or red, bloody stools ° °FOLLOW UP:  °If any biopsies were taken you will be contacted by phone or by letter within the next 1-3 weeks. Call [phone]  if you have not heard about the biopsies in 3 weeks.  °Please also call with any specific questions about appointments or follow up tests. ° °

## 2016-08-27 NOTE — Interval H&P Note (Signed)
History and Physical Interval Note:  08/27/2016 1:16 PM  Dustin Bauer  has presented today for surgery, with the diagnosis of dysphagia  The various methods of treatment have been discussed with the patient and family. After consideration of risks, benefits and other options for treatment, the patient has consented to  Procedure(s): ESOPHAGOGASTRODUODENOSCOPY (EGD) (N/A) BALLOON DILATION (N/A) as a surgical intervention .  The patient's history has been reviewed, patient examined, no change in status, stable for surgery.  I have reviewed the patient's chart and labs.  Questions were answered to the patient's satisfaction.     [name]

## 2016-08-27 NOTE — H&P (Signed)
HPI :  71 y/o male with pyloric stenosis here for follow up EGD. His last exam showed a severe pyloric stenosis, dilated up to 81mm with a good response. Here for repeat EGD with dilation. Last dose of Eliquis > 48 hours ago. Tolerated dilation well last visit, no symptoms of vomiting recently.   Past Medical History:  Diagnosis Date  . Allergy to ACE inhibitors    Cough and fatigue with ACEI  . CAD (coronary artery disease)    a. LHC in 2011 for stable angina >> DES to pLAD [facility])  //  b. inf-post STEMI 7/15 c/b CHB with jxn escape >> LHC: OM1 60-70, pLAD 30-40, m-d RCA occluded, EF 50-55 >> PCI: 3 x 33 mm Xience DES to RCA  //  c. LHC 8/17: dLM 20, pLAD 20, pLAD stent ok, pRI 30, pRCA stent ok with 30% ISR  . Colon polyps   . Complication of anesthesia    no memories from right after last colonscopy/endoscopy may 2018  . GERD (gastroesophageal reflux disease)   . History of echocardiogram    Echo 12/15:  Mild LVH, EF 60-65%, normal wall motion, grade 1 diastolic dysfunction, LA upper limits of normal, mild RAE  . Hydrocele, left   . Hyperlipidemia    Myalgias with Zocor, Crestor, atorvastatin.   Marland Kitchen Hyperplasia of prostate   . Myocardial infarction ([place]) 2014  . Nicotine abuse   . PAF (paroxysmal atrial fibrillation) [facility])    s/p ablation by Dr Roxan Hockey at [facility] in 2012 // Xarelto anticoagulation  //  ILR removed 2/17 // Event Monitor 11/17: Predominantly NSR (90%). Paroxysmal atrial fibrillation (10%).  Atrial fibrillation appears to be rate-controlled when it occurs.   . Pyloric stenosis      Past Surgical History:  Procedure Laterality Date  . ATRIAL FIBRILLATION ABLATION  07/2010   Afib ablation by Dr Roxan Hockey at [facility] in 2012  . BALLOON DILATION N/A 07/31/2016   Procedure: BALLOON DILATION;  Surgeon: Manus Gunning, MD;  Location[facility];  Service: Gastroenterology;  Laterality: N/A;  . CARDIAC CATHETERIZATION    . CARDIAC CATHETERIZATION N/A 08/17/2015     Procedure: Left Heart Cath and Coronary Angiography;  Surgeon: Larey Dresser, MD;  Location: [place] CV LAB;  Service: Cardiovascular;  Laterality: N/A;  . colonscopy and endoscopy  05/2016  . CORONARY STENT PLACEMENT     x 2  . EP IMPLANTABLE DEVICE N/A 03/09/2015   Procedure: Loop Recorder Removal;  Surgeon: [name], MD;  Location: [place] CV LAB;  Service: Cardiovascular;  Laterality: N/A;  . ESOPHAGOGASTRODUODENOSCOPY N/A 07/31/2016   Procedure: ESOPHAGOGASTRODUODENOSCOPY (EGD) also have pediatric scope available;  Surgeon: Manus Gunning, MD;  Location: [facility] ENDOSCOPY;  Service: Gastroenterology;  Laterality: N/A;  . implantable loop recorder implantation  08/13/2010   MDT Reveal XT implanted by Dr Roxan Hockey for afib management post ablation  . LEFT HEART CATH N/A 07/15/2013   Procedure: LEFT HEART CATH;  Surgeon: Clent Demark, MD;  Location: [facility] CATH LAB;  Service: Cardiovascular;  Laterality: N/A;  . TONSILLECTOMY AND ADENOIDECTOMY     Family History  Problem Relation Age of Onset  . Dementia Mother   . Heart disease Father   . Hyperlipidemia Father   . Colon cancer Paternal Grandmother   . Cancer Sister        pancreatic   Social History  Substance Use Topics  . Smoking status: Former Smoker    Packs/day: 0.25  Years: 30.00    Types: Cigarettes    Quit date: 07/21/2003  . Smokeless tobacco: Never Used  . Alcohol use 0.6 oz/week    1 Glasses of wine per week     Comment: occasional wine   Current Facility-Administered Medications  Medication Dose Route Frequency Provider Last Rate Last Dose  . 0.9 %  sodium chloride infusion   Intravenous Continuous Armbruster, Renelda Loma, MD      . lactated ringers infusion   Intravenous Continuous Armbruster, Renelda Loma, MD 10 mL/hr at 08/27/16 1248     Allergies  Allergen Reactions  . Clopidogrel Other (See Comments)    unknown reaction  . Crestor [Rosuvastatin Calcium] Other (See Comments)    Extreme  muscular weakness  . Lipitor [Atorvastatin Calcium] Other (See Comments)    Extreme muscular weakness   . Statins Other (See Comments)    Extreme muscular weakness      Review of Systems: All systems reviewed and negative except where noted in HPI.   Lab Results  Component Value Date   WBC 6.2 06/11/2016   HGB 13.7 06/11/2016   HCT 41.8 06/11/2016   MCV 94 06/11/2016   PLT 196 06/11/2016    Lab Results  Component Value Date   CREATININE 1.04 06/11/2016   BUN 18 06/11/2016   NA 142 06/11/2016   K 4.7 06/11/2016   CL 106 06/11/2016   CO2 24 06/11/2016    Lab Results  Component Value Date   ALT 12 06/11/2016   AST 17 06/11/2016   ALKPHOS 84 06/11/2016   BILITOT 0.4 06/11/2016     Physical Exam: BP (!) 144/76   Pulse (!) 52   Temp 97.9 F (36.6 C) (Oral)   Resp 13   Ht 5\' 10"  (1.778 m)   Wt 161 lb (73 kg)   SpO2 99%   BMI 23.10 kg/m  Constitutional: Pleasant,well-developed, male in no acute distress. HEENT: Normocephalic and atraumatic. Conjunctivae are normal. No scleral icterus. Neck supple.  Cardiovascular: Normal rate, regular rhythm.  Pulmonary/chest: Effort normal and breath sounds normal. No wheezing, rales or rhonchi. Abdominal: Soft, nondistended, nontender.  There are no masses palpable. No hepatomegaly. Extremities: no edema Lymphadenopathy: No cervical adenopathy noted. Neurological: Alert and oriented to person place and time. Skin: Skin is warm and dry. No rashes noted. Psychiatric: Normal mood and affect. Behavior is normal.   ASSESSMENT AND PLAN: 71 y/o male here for repeat EGD for reassessment of pyloric stenosis and further dilation as needed. I have discussed risks / benefits of EGD with dilation with him, and he wished to proceed. Further recommendations pending the results.   [place] Cellar, MD [facility] Pager [phone]

## 2016-08-27 NOTE — Anesthesia Preprocedure Evaluation (Signed)
Anesthesia Evaluation  Patient identified by MRN, date of birth, ID band Patient awake    Reviewed: Allergy & Precautions, H&P , NPO status , Patient's Chart, lab work & pertinent test results  Airway Mallampati: II   Neck ROM: full    Dental   Pulmonary former smoker,    breath sounds clear to auscultation       Cardiovascular + CAD, + Past MI and + Cardiac Stents  + dysrhythmias Atrial Fibrillation  Rhythm:regular Rate:Normal     Neuro/Psych    GI/Hepatic GERD  ,  Endo/Other    Renal/GU      Musculoskeletal   Abdominal   Peds  Hematology   Anesthesia Other Findings   Reproductive/Obstetrics                             Anesthesia Physical  Anesthesia Plan  ASA: III  Anesthesia Plan: MAC   Post-op Pain Management:    Induction: Intravenous  PONV Risk Score and Plan: 1 and Ondansetron, Treatment may vary due to age or medical condition and Propofol infusion  Airway Management Planned: Nasal Cannula and Natural Airway  Additional Equipment:   Intra-op Plan:   Post-operative Plan:   Informed Consent: I have reviewed the patients History and Physical, chart, labs and discussed the procedure including the risks, benefits and alternatives for the proposed anesthesia with the patient or authorized representative who has indicated his/her understanding and acceptance.     Plan Discussed with: CRNA and Anesthesiologist  Anesthesia Plan Comments:         Anesthesia Quick Evaluation

## 2016-08-27 NOTE — Op Note (Signed)
[facility] Patient Name: Dustin Bauer Procedure Date: 08/27/2016 MRN: [id number] Attending MD: Carlota Raspberry. Robert Sperl MD, MD Date of Birth: 1945-05-01 CSN: [id number] Age: 71 Admit Type: Outpatient Procedure:                Upper GI endoscopy Indications:              For therapy of pyloric stenosis. prior symptoms of                            gastric outlet obstruction, dilated to 35m on last                            exam. No further symptoms of vomiting. Providers:                SCarlota Raspberry Oliviarose Punch MD, MD, KElmer Ramp HTilden Dome RN,                            JGeneral Dynamics Technician, Stephanie [place] CRNA Referring MD:              Medicines:                Monitored Anesthesia Care Complications:            No immediate complications. Estimated blood loss:                            Minimal. Estimated Blood Loss:     Estimated blood loss was minimal. Procedure:                Pre-Anesthesia Assessment:                           - Prior to the procedure, a History and Physical                            was performed, and patient medications and                            allergies were reviewed. The patient's tolerance of                            previous anesthesia was also reviewed. The risks                            and benefits of the procedure and the sedation                            options and risks were discussed with the patient.                            All questions were answered, and informed consent                            was obtained. Prior Anticoagulants: The patient has  taken Eliquis (apixaban), last dose was 2 days                            prior to procedure. ASA Grade Assessment: III - A                            patient with severe systemic disease. After                            reviewing the risks and benefits, the patient was                            deemed in satisfactory condition to undergo the                             procedure.                           After obtaining informed consent, the endoscope was                            passed under direct vision. Throughout the                            procedure, the patient's blood pressure, pulse, and                            oxygen saturations were monitored continuously. The                            Olympus GIF-H190 ([id number]) endoscope was introduced                            through the mouth, and advanced to the second part                            of duodenum. The upper GI endoscopy was                            accomplished without difficulty. The patient                            tolerated the procedure well. Scope In: Scope Out: Findings:      Esophagogastric landmarks were identified: the Z-line was found at 40       cm, the gastroesophageal junction was found at 40 cm and the upper       extent of the gastric folds was found at 42 cm from the incisors.      A 2 cm hiatal hernia was present.      The z-line was slightly irregular but < 1cm in length, no biopsies       obtained. The exam of the esophagus was otherwise normal.      A benign-appearing, intrinsic stenosis was found at the pylorus. The       lumen was wider than  initially noted on the last exam, but could not       traverse it without dilation. A TTS dilator was passed through the       scope. Dilation with a 15-16.5-18 mm pyloric balloon dilator was       performed at 35m and then 135mwith a good response and appropriate       mucosal wrents. The stricture was previously biopsied and benign,       additional biopsies not taken today.      The exam of the stomach was otherwise normal. No retained food on this       exam.      The duodenal bulb and second portion of the duodenum were normal. Impression:               - Esophagogastric landmarks identified.                           - 2 cm hiatal hernia.                           - Gastric  stenosis was found at the pylorus.                            Dilated to 1838mith good response.                           - Normal duodenal bulb and second portion of the                            duodenum.                           Overall, patient has had a good functional response                            to dilation - no further retained food in the                            stomach and no further symptoms. While stenosis                            remained present, not causing symptoms. Dilated to                            44m89mday with good result. Moderate Sedation:      No moderate sedation, case performed with MAC Recommendation:           - Patient has a contact number available for                            emergencies. The signs and symptoms of potential                            delayed complications were discussed with the  patient. Return to normal activities tomorrow.                            Written discharge instructions were provided to the                            patient.                           - Resume previous diet, advance slowly as tolerated                           - Continue present medications.                           - Resume Eliquis in 2 days                           - Repeat upper endoscopy as needed for retreatment                            if symptoms recur in the future. If symptoms do                            recur moving forward, may need pyloric stenting,                            however given his response to initial balloon                            dilation, hopefully that is not needed. Procedure Code(s):        --- Professional ---                           [id number], Esophagogastroduodenoscopy, flexible,                            transoral; with dilation of gastric/duodenal                            stricture(s) (eg, balloon, bougie) Diagnosis Code(s):        --- Professional ---                            K44.9, Diaphragmatic hernia without obstruction or                            gangrene                           K31.1, Adult hypertrophic pyloric stenosis CPT copyright 2016 American Medical Association. All rights reserved. The codes documented in this report are preliminary and upon coder review may  be revised to meet current compliance requirements. Remo Lipps P. Emanuela Runnion MD, MD 08/27/2016 1:54:10 PM This report has been signed electronically. Number of Addenda: 0

## 2016-08-27 NOTE — Transfer of Care (Signed)
Immediate Anesthesia Transfer of Care Note  Patient: Dustin Bauer  Procedure(s) Performed: Procedure(s): ESOPHAGOGASTRODUODENOSCOPY (EGD) (N/A) BALLOON DILATION (N/A)  Patient Location: PACU and Endoscopy Unit  Anesthesia Type:MAC  Level of Consciousness: awake and alert   Airway & Oxygen Therapy: Patient Spontanous Breathing  Post-op Assessment: Report given to RN and Post -op Vital signs reviewed and stable  Post vital signs: Reviewed and stable  Last Vitals:  Vitals:   08/27/16 1235  BP: (!) 144/76  Pulse: (!) 52  Resp: 13  Temp: 36.6 C  SpO2: 99%    Last Pain:  Vitals:   08/27/16 1235  TempSrc: Oral         Complications: No apparent anesthesia complications

## 2016-08-28 ENCOUNTER — Encounter (HOSPITAL_COMMUNITY): Payer: Self-pay | Admitting: Gastroenterology

## 2016-09-01 NOTE — Anesthesia Postprocedure Evaluation (Signed)
Anesthesia Post Note  Patient: Dustin Bauer  Procedure(s) Performed: Procedure(s) (LRB): ESOPHAGOGASTRODUODENOSCOPY (EGD) (N/A) BALLOON DILATION (N/A)     Patient location during evaluation: Endoscopy Anesthesia Type: MAC Level of consciousness: awake and alert Pain management: pain level controlled Vital Signs Assessment: post-procedure vital signs reviewed and stable Respiratory status: spontaneous breathing, nonlabored ventilation, respiratory function stable and patient connected to nasal cannula oxygen Cardiovascular status: stable and blood pressure returned to baseline Anesthetic complications: no    Last Vitals:  Vitals:   08/27/16 1410 08/27/16 1420  BP: 119/75   Pulse: (!) 49 (!) 49  Resp: 16   Temp:    SpO2: 100%     Last Pain:  Vitals:   08/27/16 1346  TempSrc: Oral                 Sherise Geerdes,JAMES TERRILL

## 2016-09-08 NOTE — Addendum Note (Signed)
Addendum  created 09/08/16 2449 by Rica Koyanagi, MD   Sign clinical note

## 2016-09-08 NOTE — Anesthesia Postprocedure Evaluation (Signed)
Anesthesia Post Note  Patient: Dustin Bauer  Procedure(s) Performed: Procedure(s) (LRB): ESOPHAGOGASTRODUODENOSCOPY (EGD) (N/A) BALLOON DILATION (N/A)     Patient location during evaluation: PACU Anesthesia Type: MAC Level of consciousness: awake and alert Pain management: pain level controlled Vital Signs Assessment: post-procedure vital signs reviewed and stable Respiratory status: spontaneous breathing, nonlabored ventilation, respiratory function stable and patient connected to nasal cannula oxygen Cardiovascular status: stable and blood pressure returned to baseline Anesthetic complications: no    Last Vitals:  Vitals:   08/27/16 1410 08/27/16 1420  BP: 119/75   Pulse: (!) 49 (!) 49  Resp: 16   Temp:    SpO2: 100%     Last Pain:  Vitals:   08/27/16 1346  TempSrc: Oral                 Elvira Langston,JAMES TERRILL

## 2016-09-22 ENCOUNTER — Ambulatory Visit (INDEPENDENT_AMBULATORY_CARE_PROVIDER_SITE_OTHER): Payer: Medicare Other | Admitting: Pulmonary Disease

## 2016-09-22 ENCOUNTER — Encounter: Payer: Self-pay | Admitting: Pulmonary Disease

## 2016-09-22 ENCOUNTER — Ambulatory Visit (INDEPENDENT_AMBULATORY_CARE_PROVIDER_SITE_OTHER)
Admission: RE | Admit: 2016-09-22 | Discharge: 2016-09-22 | Disposition: A | Discharge status: Home / Self Care | Payer: Medicare Other | Source: Ambulatory Visit | Attending: Pulmonary Disease | Admitting: Pulmonary Disease

## 2016-09-22 VITALS — BP 118/76 | HR 67 | Ht 70.0 in | Wt 164.2 lb

## 2016-09-22 DIAGNOSIS — R918 Other nonspecific abnormal finding of lung field: Secondary | ICD-10-CM

## 2016-09-22 NOTE — Progress Notes (Signed)
Subjective:    Patient ID: Dustin Bauer, male    DOB: 1946-01-09, 71 y.o.   MRN: [id number]  Synopsis: First seen in 2017 for evaluation of multiple pulmonary nodules.  HPI Chief Complaint  Patient presents with  . Follow-up    Pt had CT prior to visit today. Pt doing well overall.    Dustin Bauer is still exercising a lot eery day.  He still walks every day. He is not running any more. No breathing difficulty.  He says that he thinks he needs another CT scan.  He had a CT scan of his abdomen this spring because of some abdominal pain.  He was diagnosed with pyloric stenosis which required balloon dilation of his.  Past Medical History:  Diagnosis Date  . Allergy to ACE inhibitors    Cough and fatigue with ACEI  . CAD (coronary artery disease)    a. LHC in 2011 for stable angina >> DES to pLAD [facility])  //  b. inf-post STEMI 7/15 c/b CHB with jxn escape >> LHC: OM1 60-70, pLAD 30-40, m-d RCA occluded, EF 50-55 >> PCI: 3 x 33 mm Xience DES to RCA  //  c. LHC 8/17: dLM 20, pLAD 20, pLAD stent ok, pRI 30, pRCA stent ok with 30% ISR  . Colon polyps   . Complication of anesthesia    no memories from right after last colonscopy/endoscopy may 2018  . GERD (gastroesophageal reflux disease)   . History of echocardiogram    Echo 12/15:  Mild LVH, EF 60-65%, normal wall motion, grade 1 diastolic dysfunction, LA upper limits of normal, mild RAE  . Hydrocele, left   . Hyperlipidemia    Myalgias with Zocor, Crestor, atorvastatin.   Marland Kitchen Hyperplasia of prostate   . Myocardial infarction ([place]) 2014  . Nicotine abuse   . PAF (paroxysmal atrial fibrillation) [facility])    s/p ablation by Dr Roxan Hockey at [facility] in 2012 // Xarelto anticoagulation  //  ILR removed 2/17 // Event Monitor 11/17: Predominantly NSR (90%). Paroxysmal atrial fibrillation (10%).  Atrial fibrillation appears to be rate-controlled when it occurs.   . Pyloric stenosis       Review of Systems     Objective:   Physical Exam Vitals:   09/22/16 1629  BP: 118/76  Pulse: 67  SpO2: 98%  Weight: 164 lb 3.2 oz (74.5 kg)  Height: 5\' 10"  (1.778 m)   RA  Gen: well appearing HENT: OP clear, TM's clear, neck supple PULM: CTA B, normal percussion CV: RRR, no mgr, trace edema GI: BS+, soft, nontender Derm: no cyanosis or rash Psyche: normal mood and affect    Chest imaging: February 2018 CT chest: Left lower lobe nodule resolved, new right lower lobe 8 mm nodule, 6 mm solid component with surrounding groundglass     Assessment & Plan:   Multiple pulmonary nodules  Abnormal findings on diagnostic imaging of lung - Plan: DG Chest 2 View, CT Chest Wo Contrast, CANCELED: CT Chest Wo Contrast  Discussion/Plan: The most recent CT scan of his chest performed in February 2018 showed a new right lower lobe nodule. However, when I review the images of the lung from the CT abdomen in June I can't find it. I believe that it should have been visible on this CT scan. We'll get a chest x-ray now to see if there is evidence of growth and then will repeat a CT in the spring of 2019. I will try to coordinate this with the  vascular surgeons who are following his aortic aneurysm.    Current Outpatient Prescriptions:  .  acetaminophen (TYLENOL) 325 MG tablet, Take 325 mg by mouth every 6 (six) hours as needed (FOR PAIN.)., Disp: , Rfl:  .  Cholecalciferol (SM VITAMIN D3) 4000 units CAPS, Take 4,000 Units by mouth daily., Disp: , Rfl:  .  ELIQUIS 5 MG TABS tablet, TAKE 1 TABLET BY MOUTH TWICE A DAY, Disp: 60 tablet, Rfl: 3 .  ketoconazole (NIZORAL) 2 % cream, Apply 1 application topically daily as needed for irritation., Disp: 15 g, Rfl: 1 .  Melatonin 3 MG TABS, Take 3 mg by mouth at bedtime., Disp: , Rfl:  .  PRALUENT 75 MG/ML SOPN, INJECT 75MG  SUBCUTANEOUSLY EVERY 2 WEEKS, Disp: 2 pen, Rfl: 6 .  Resveratrol 250 MG CAPS, Take 250 mg by mouth 3 (three) times a week. , Disp: , Rfl:

## 2016-09-22 NOTE — Patient Instructions (Signed)
For your pulmonary nodule: We will arrange for another CT scan in February 2019 I will try to coordinate your CT scans with Dr. Trula Slade We will get a chest x-ray today and call you with those results We will see you back in March 2019

## 2016-09-23 ENCOUNTER — Telehealth: Payer: Self-pay | Admitting: *Deleted

## 2016-09-23 ENCOUNTER — Other Ambulatory Visit: Payer: Self-pay | Admitting: *Deleted

## 2016-09-23 ENCOUNTER — Telehealth: Payer: Self-pay | Admitting: Pulmonary Disease

## 2016-09-23 DIAGNOSIS — I714 Abdominal aortic aneurysm, without rupture, unspecified: Secondary | ICD-10-CM

## 2016-09-23 DIAGNOSIS — R918 Other nonspecific abnormal finding of lung field: Secondary | ICD-10-CM

## 2016-09-23 NOTE — Telephone Encounter (Signed)
We ordered a CT yesterday for 02/23/17, does this need to be moved up sooner?

## 2016-09-23 NOTE — Telephone Encounter (Signed)
Yep, needs CT chest without contrast

## 2016-09-23 NOTE — Telephone Encounter (Signed)
Yes please, next available

## 2016-09-23 NOTE — Telephone Encounter (Signed)
Sched appts 02/23/17. CTA at 10:30 at [place]. MD at 12:00. Mailed CT instruction letter.

## 2016-09-23 NOTE — Telephone Encounter (Signed)
Spoke with pt and advised him of the CXR results. He agreed to have CT chest moved up. I sent in another order to have the CT scheduled in the next week. Nothing further is needed.

## 2016-09-23 NOTE — Telephone Encounter (Signed)
[place] with [facility] calling with a call report on pt's cxr from yesterday.  Full report available in Epic, impression copied below.    BQ please advise if anything further is needed.  Thanks!   IMPRESSION: 20 mm infrahilar right pulmonary nodule. CT chest with contrast recommended to further evaluate.

## 2016-09-23 NOTE — Telephone Encounter (Signed)
-----   Message from Serafina Mitchell, MD sent at 09/23/2016  8:21 AM EDT ----- Sure, I will order CTA chest abdomen and pelvis ----- Message ----- From: Juanito Doom, MD Sent: 09/22/2016   4:51 PM To: Serafina Mitchell, MD  Rock Nephew,  I'm seeing this gentleman for a pulmonary nodule, he needs a repeat at some point in the spring of 2019.  Any chance you can do your CT angio of the abdomen at the same time?. He would like to coordinate his CT scans to limit his radiation exposure.  Thanks, Ruby Cola

## 2016-09-25 ENCOUNTER — Ambulatory Visit (INDEPENDENT_AMBULATORY_CARE_PROVIDER_SITE_OTHER)
Admission: RE | Admit: 2016-09-25 | Discharge: 2016-09-25 | Disposition: A | Discharge status: Home / Self Care | Payer: Medicare Other | Source: Ambulatory Visit | Attending: Pulmonary Disease | Admitting: Pulmonary Disease

## 2016-09-25 DIAGNOSIS — R918 Other nonspecific abnormal finding of lung field: Secondary | ICD-10-CM

## 2016-09-29 ENCOUNTER — Other Ambulatory Visit: Payer: Self-pay

## 2016-09-29 DIAGNOSIS — R918 Other nonspecific abnormal finding of lung field: Secondary | ICD-10-CM

## 2016-10-01 ENCOUNTER — Telehealth: Payer: Self-pay | Admitting: Pharmacist

## 2016-10-01 MED ORDER — RIVAROXABAN 20 MG PO TABS: 20.0000 mg | ORAL_TABLET | Freq: Every day | ORAL | 6 refills | Status: DC

## 2016-10-01 NOTE — Telephone Encounter (Signed)
Pt reports getting red bumps on his arms, legs, back, and stomach that itch. He thinks they are from his Eliquis (switched from Xarelto to [place] on 5/23 afib office visit due to stomach issues on Xarelto). He states the red bumps started 6-8 months (prior to Eliquis start) ago but he has been getting more in the past few weeks. Pt wants to switch back to Xarelto. No issues with this - will switch back to Xarelto 20mg  daily with supper. Advised pt that if his bumps do not improve within the next few weeks to see a dermatologist to rule out other causes. Her verbalized understanding and is in agreement with treatment plan.

## 2016-12-10 ENCOUNTER — Ambulatory Visit: Payer: Medicare Other | Admitting: Family Medicine

## 2016-12-10 ENCOUNTER — Encounter: Payer: Self-pay | Admitting: Family Medicine

## 2016-12-10 VITALS — BP 125/80 | HR 48 | Temp 97.0°F | Ht 70.0 in | Wt 161.0 lb

## 2016-12-10 DIAGNOSIS — I480 Paroxysmal atrial fibrillation: Secondary | ICD-10-CM | POA: Diagnosis not present

## 2016-12-10 DIAGNOSIS — I2510 Atherosclerotic heart disease of native coronary artery without angina pectoris: Secondary | ICD-10-CM | POA: Diagnosis not present

## 2016-12-10 DIAGNOSIS — E7800 Pure hypercholesterolemia, unspecified: Secondary | ICD-10-CM | POA: Diagnosis not present

## 2016-12-10 DIAGNOSIS — E559 Vitamin D deficiency, unspecified: Secondary | ICD-10-CM

## 2016-12-10 DIAGNOSIS — Z23 Encounter for immunization: Secondary | ICD-10-CM | POA: Diagnosis not present

## 2016-12-10 DIAGNOSIS — I714 Abdominal aortic aneurysm, without rupture, unspecified: Secondary | ICD-10-CM

## 2016-12-10 DIAGNOSIS — I499 Cardiac arrhythmia, unspecified: Secondary | ICD-10-CM

## 2016-12-10 DIAGNOSIS — K311 Adult hypertrophic pyloric stenosis: Secondary | ICD-10-CM

## 2016-12-10 DIAGNOSIS — Z0000 Encounter for general adult medical examination without abnormal findings: Secondary | ICD-10-CM

## 2016-12-10 DIAGNOSIS — R918 Other nonspecific abnormal finding of lung field: Secondary | ICD-10-CM

## 2016-12-10 DIAGNOSIS — I10 Essential (primary) hypertension: Secondary | ICD-10-CM | POA: Diagnosis not present

## 2016-12-10 DIAGNOSIS — W57XXXS Bitten or stung by nonvenomous insect and other nonvenomous arthropods, sequela: Secondary | ICD-10-CM

## 2016-12-10 DIAGNOSIS — R972 Elevated prostate specific antigen [PSA]: Secondary | ICD-10-CM

## 2016-12-10 LAB — URINALYSIS, COMPLETE
Bilirubin, UA: NEGATIVE
GLUCOSE, UA: NEGATIVE
Leukocytes, UA: NEGATIVE
Nitrite, UA: NEGATIVE
PH UA: 5 (ref 5.0–7.5)
PROTEIN UA: NEGATIVE
Specific Gravity, UA: 1.025 (ref 1.005–1.030)
Urobilinogen, Ur: 0.2 mg/dL (ref 0.2–1.0)

## 2016-12-10 LAB — MICROSCOPIC EXAMINATION
BACTERIA UA: NONE SEEN
Epithelial Cells (non renal): NONE SEEN /hpf (ref 0–10)
RENAL EPITHEL UA: NONE SEEN /HPF
WBC, UA: NONE SEEN /hpf (ref 0–?)

## 2016-12-10 NOTE — Progress Notes (Signed)
Subjective:    Patient ID: Dustin Bauer, male    DOB: 10/05/1945, 71 y.o.   MRN: [id number]  HPI Patient is here today for annual wellness exam and follow up of chronic medical problems which includes hyperlipidemia and hypertension. He is taking medication regularly.  Patient is followed regularly by the cardiologist because of his history of heart disease.  He is recently also been seen by the gastroenterologist because of pyloric stenosis with dilatation of this.  He has a hiatal hernia also.  He also sees the pulmonologist because of multiple pulmonary nodules and he has a history of an infrarenal aortic abdominal aneurysm at 3.7 cm.  All of this is being monitored regularly by different specialists.  The patient is statin intolerant and is taking purulent injections every 2 weeks.  This is a PCSK 9 inhibitor.  Despite these multiple findings for this patient he is doing well overall other than complaining with drainage and allergies.  Blood pressure recheck following initial blood pressure was good at 125/80.  He will get his flu shot today and the new shingles shot was discussed and he will check with his insurance and hopefully we can give him this at his next visit or after a month of time is gone by.  Patient denies any chest pain or shortness of breath.  He continues to exercise regularly.  He denies any trouble currently with his stomach including nausea vomiting diarrhea blood in the stool or black tarry bowel movements.  His bowel habits are stable.  During the past year he has had 3 endoscopies and 1 colonoscopy.  The additional endoscopies were to follow-up on the pyloric stenosis and currently the symptoms with that are not present.  He is passing his water without problems.  He says that he sees Dr. Carolynn Sayers for his eye problems and that he does have cataracts and may need cataract surgery soon.     Patient Active Problem List   Diagnosis Date Noted  . Pyloric stricture   . Nausea and  vomiting 05/01/2016  . History of colonic polyps 05/01/2016  . Dysphagia 05/01/2016  . Chronic anticoagulation 05/01/2016  . History of heart block 08/13/2015  . Dyspnea 06/13/2015  . Multiple pulmonary nodules 06/13/2015  . Hypertensive cardiovascular disease 04/17/2014  . CAD (coronary artery disease) 12/16/2013  . History of acute inferior wall myocardial infarction 07/15/2013  . Abnormal chest CT 05/30/2013  . Elevated PSA 05/30/2013  . Hyperlipidemia 06/24/2012  . Paroxysmal atrial fibrillation ([place]) 06/24/2012  . Statin intolerance 06/24/2012   Outpatient Encounter Medications as of 12/10/2016  Medication Sig  . acetaminophen (TYLENOL) 325 MG tablet Take 325 mg by mouth every 6 (six) hours as needed (FOR PAIN.).  Marland Kitchen Cholecalciferol (SM VITAMIN D3) 4000 units CAPS Take 4,000 Units by mouth daily.  Marland Kitchen ketoconazole (NIZORAL) 2 % cream Apply 1 application topically daily as needed for irritation.  . Melatonin 3 MG TABS Take 3 mg by mouth at bedtime.  Marland Kitchen PRALUENT 75 MG/ML SOPN INJECT 75MG SUBCUTANEOUSLY EVERY 2 WEEKS  . Resveratrol 250 MG CAPS Take 250 mg by mouth 3 (three) times a week.   . rivaroxaban (XARELTO) 20 MG TABS tablet Take 1 tablet (20 mg total) by mouth daily with supper.   No facility-administered encounter medications on file as of 12/10/2016.       Review of Systems  Constitutional: Negative.   HENT: Positive for postnasal drip (allergies (blowing leaves)).   Eyes: Negative.   Respiratory:  Negative.   Cardiovascular: Negative.   Gastrointestinal: Negative.   Endocrine: Negative.   Genitourinary: Negative.   Musculoskeletal: Negative.   Skin: Negative.   Allergic/Immunologic: Negative.   Neurological: Negative.   Hematological: Negative.   Psychiatric/Behavioral: Negative.        Objective:   Physical Exam  Constitutional: He is oriented to person, place, and time. He appears well-developed and well-nourished. No distress.  Patient is pleasant and  alert and keeping up with his specialty appointments as well as possible  HENT:  Head: Normocephalic and atraumatic.  Right Ear: External ear normal.  Left Ear: External ear normal.  Nose: Nose normal.  Mouth/Throat: Oropharynx is clear and moist. No oropharyngeal exudate.  Eyes: Conjunctivae and EOM are normal. Pupils are equal, round, and reactive to light. Right eye exhibits no discharge. Left eye exhibits no discharge. No scleral icterus.  Neck: Normal range of motion. Neck supple. No thyromegaly present.  No bruits thyromegaly or anterior cervical adenopathy  Cardiovascular: Normal rate, normal heart sounds and intact distal pulses.  No murmur heard. The heart is irregular today at about 60/min  Pulmonary/Chest: Effort normal and breath sounds normal. No respiratory distress. He has no wheezes. He has no rales. He exhibits no tenderness.  Clear anteriorly and posteriorly and no axillary adenopathy  Abdominal: Soft. Bowel sounds are normal. He exhibits no mass. There is no tenderness. There is no rebound and no guarding.  No abdominal tenderness masses organ enlargement or bruits.  No inguinal adenopathy.  Genitourinary: Rectum normal and penis normal.  Genitourinary Comments: The prostate is enlarged but smooth and somewhat firm throughout.  There were no rectal masses.  There were no inguinal hernias palpable.  The patient does have what appears to be bilateral hydroceles for which he has seen the urologist in the past.  Musculoskeletal: Normal range of motion. He exhibits no edema.  Lymphadenopathy:    He has no cervical adenopathy.  Neurological: He is alert and oriented to person, place, and time. He has normal reflexes. No cranial nerve deficit.  Skin: Skin is warm and dry. Rash noted. There is erythema. No pallor.  Several areas that were pruritic from the summertime that are fewer than they were then which appear to be resolving insect bites.  Psychiatric: He has a normal mood  and affect. His behavior is normal. Judgment and thought content normal.  Nursing note and vitals reviewed.   BP (!) 146/84 (BP Location: Left Arm)   Pulse (!) 48   Temp (!) 97 F (36.1 C) (Oral)   Ht '5\' 10"'$  (1.778 m)   Wt 161 lb (73 kg)   BMI 23.10 kg/m   EKG with results pending===     Assessment & Plan:  1. Essential hypertension -A repeat blood pressure today was good and patient will continue with current treatment - BMP8+EGFR; Future - CBC with Differential/Platelet; Future - Hepatic function panel; Future  2. Pure hypercholesterolemia -Continue with PC SK 9 inhibitor therapy pending results of lab work - CBC with Differential/Platelet; Future - Lipid panel; Future  3. Paroxysmal atrial fibrillation (HCC) -Continue follow-up with cardiology as planned - CBC with Differential/Platelet; Future  4. Vitamin D deficiency -Continue current treatment pending results of lab work - CBC with Differential/Platelet; Future - VITAMIN D 25 Hydroxy (Vit-D Deficiency, Fractures); Future  5. ASCVD (arteriosclerotic cardiovascular disease) -Follow-up with cardiology as planned - CBC with Differential/Platelet; Future - Lipid panel; Future  6. Annual physical exam -Follow-up with pulmonology cardiology and gastroenterology  and vascular surgery as planned - BMP8+EGFR; Future - CBC with Differential/Platelet; Future - Lipid panel; Future - VITAMIN D 25 Hydroxy (Vit-D Deficiency, Fractures); Future - PSA, total and free; Future - Thyroid Panel With TSH; Future - Hepatic function panel; Future - Urinalysis, Complete; Future  7. Irregular heart beat - EKG 12-Lead  8. Pyloric stricture -He is doing well with his following dilatation of pyloric stenosis by gastroenterologist.  9. Elevated PSA -The patient does have BPH.  10. Multiple pulmonary nodules -Follow-up with pulmonology and get CT scans as recommended  11. Insect bite, sequela -Use deep [place] off  12.  Abdominal aortic aneurysm (AAA) without rupture [facility]) -Get follow-up scans as recommended by vascular surgery  Patient Instructions                       Medicare Annual Wellness Visit  [place] and the medical providers at [place] strive to bring you the best medical care.  In doing so we not only want to address your current medical conditions and concerns but also to detect new conditions early and prevent illness, disease and health-related problems.    Medicare offers a yearly Wellness Visit which allows our clinical staff to assess your need for preventative services including immunizations, lifestyle education, counseling to decrease risk of preventable diseases and screening for fall risk and other medical concerns.    This visit is provided free of charge (no copay) for all Medicare recipients. The clinical pharmacists at [place] have begun to conduct these Wellness Visits which will also include a thorough review of all your medications.    As you primary medical provider recommend that you make an appointment for your Annual Wellness Visit if you have not done so already this year.  You may set up this appointment before you leave today or you may call back ([phone]) and schedule an appointment.  Please make sure when you call that you mention that you are scheduling your Annual Wellness Visit with the clinical pharmacist so that the appointment may be made for the proper length of time.     Continue current medications. Continue good therapeutic lifestyle changes which include good diet and exercise. Fall precautions discussed with patient. If an FOBT was given today- please return it to our front desk. If you are over 40 years old - you may need Prevnar 36 or the adult Pneumonia vaccine.  **Flu shots are available--- please call and schedule a FLU-CLINIC appointment**  After your visit with [place] today you will receive a  survey in the mail or online from [employer] regarding your care with [place]. Please take a moment to fill this out. Your feedback is very important to [place] as you can help [place] better understand your patient needs as well as improve your experience and satisfaction. WE CARE ABOUT YOU!!!     Arrie Senate MD

## 2016-12-10 NOTE — Patient Instructions (Addendum)
Medicare Annual Wellness Visit  [facility] and the medical providers at [place] strive to bring you the best medical care.  In doing so we not only want to address your current medical conditions and concerns but also to detect new conditions early and prevent illness, disease and health-related problems.    Medicare offers a yearly Wellness Visit which allows our clinical staff to assess your need for preventative services including immunizations, lifestyle education, counseling to decrease risk of preventable diseases and screening for fall risk and other medical concerns.    This visit is provided free of charge (no copay) for all Medicare recipients. The clinical pharmacists at [facility] have begun to conduct these Wellness Visits which will also include a thorough review of all your medications.    As you primary medical provider recommend that you make an appointment for your Annual Wellness Visit if you have not done so already this year.  You may set up this appointment before you leave today or you may call back ([phone]) and schedule an appointment.  Please make sure when you call that you mention that you are scheduling your Annual Wellness Visit with the clinical pharmacist so that the appointment may be made for the proper length of time.     Continue current medications. Continue good therapeutic lifestyle changes which include good diet and exercise. Fall precautions discussed with patient. If an FOBT was given today- please return it to our front desk. If you are over 23 years old - you may need Prevnar 25 or the adult Pneumonia vaccine.  **Flu shots are available--- please call and schedule a FLU-CLINIC appointment**  After your visit with [place] today you will receive a survey in the mail or online from [employer] regarding your care with [place]. Please take a moment to fill this out. Your feedback is very  important to [place] as you can help [place] better understand your patient needs as well as improve your experience and satisfaction. WE CARE ABOUT YOU!!!   Follow-up with cardiology pulmonology vascular surgery and gastroenterology as planned Use deep [place] off when hiking This winter drink plenty of fluids and stay well-hydrated For allergy symptoms taking Claritin 1 daily may be helpful Also use cool mist humidification and avoid irritating environments as much as possible

## 2016-12-10 NOTE — Addendum Note (Signed)
Addended by: Earlene Plater on: 12/10/2016 03:09 PM   Modules accepted: Orders

## 2017-01-14 ENCOUNTER — Other Ambulatory Visit: Payer: Self-pay

## 2017-01-14 ENCOUNTER — Other Ambulatory Visit: Payer: Self-pay | Admitting: Pharmacist

## 2017-01-14 MED ORDER — ALIROCUMAB 75 MG/ML ~~LOC~~ SOPN: 1.0000 "pen " | PEN_INJECTOR | SUBCUTANEOUS | 11 refills | Status: DC

## 2017-01-20 DIAGNOSIS — I714 Abdominal aortic aneurysm, without rupture, unspecified: Secondary | ICD-10-CM

## 2017-01-20 HISTORY — DX: Abdominal aortic aneurysm, without rupture, unspecified: I71.40

## 2017-01-20 HISTORY — DX: Abdominal aortic aneurysm, without rupture: I71.4

## 2017-01-30 ENCOUNTER — Other Ambulatory Visit: Payer: Medicare Other

## 2017-01-30 ENCOUNTER — Ambulatory Visit
Admission: RE | Admit: 2017-01-30 | Discharge: 2017-01-30 | Disposition: A | Discharge status: Home / Self Care | Payer: Medicare Other | Source: Ambulatory Visit | Attending: Surgery | Admitting: Surgery

## 2017-01-30 DIAGNOSIS — N433 Hydrocele, unspecified: Secondary | ICD-10-CM

## 2017-01-30 DIAGNOSIS — R918 Other nonspecific abnormal finding of lung field: Secondary | ICD-10-CM

## 2017-01-30 DIAGNOSIS — I714 Abdominal aortic aneurysm, without rupture, unspecified: Secondary | ICD-10-CM

## 2017-01-30 DIAGNOSIS — I712 Thoracic aortic aneurysm, without rupture: Secondary | ICD-10-CM

## 2017-01-30 DIAGNOSIS — I7121 Aneurysm of the ascending aorta, without rupture: Secondary | ICD-10-CM

## 2017-01-30 HISTORY — DX: Aneurysm of the ascending aorta, without rupture: I71.21

## 2017-01-30 HISTORY — DX: Hydrocele, unspecified: N43.3

## 2017-01-30 HISTORY — DX: Thoracic aortic aneurysm, without rupture: I71.2

## 2017-01-30 MED ORDER — IOPAMIDOL (ISOVUE-370) INJECTION 76%
75.0000 mL | Freq: Once | INTRAVENOUS | Status: AC | PRN
  Administered 2017-01-30: 75 mL via INTRAVENOUS

## 2017-02-23 ENCOUNTER — Other Ambulatory Visit: Payer: Medicare Other

## 2017-02-23 ENCOUNTER — Encounter: Payer: Self-pay | Admitting: Surgery

## 2017-02-23 ENCOUNTER — Ambulatory Visit: Payer: Medicare Other | Admitting: Surgery

## 2017-02-23 VITALS — BP 128/88 | HR 52 | Resp 21 | Ht 70.0 in | Wt 164.1 lb

## 2017-02-23 DIAGNOSIS — I714 Abdominal aortic aneurysm, without rupture, unspecified: Secondary | ICD-10-CM

## 2017-02-23 NOTE — Progress Notes (Signed)
HISTORY AND PHYSICAL     CC:  F/u AAA Requesting Provider:  Chipper Herb, MD  HPI: This is a 72 y.o. male who was first seen by Dr. Trula Slade in 2017 for evaluation of a 3.9cm AAA that was detected on CT scan for weight loss.  He has a hx of PAF and is on Xarelto for this.  He has hyperlipidemia but is not on a statin due to intolerance with muscle weakness.  He denies any abdominal or back pain.  [facility].  He denies any claudication symptoms.   He has a remote tobacco history as he quit smoking ~ 20 years ago.    He is here today for discussion of his CTA for evaluation of his AAA.   Past Medical History:  Diagnosis Date  . Allergy to ACE inhibitors    Cough and fatigue with ACEI  . CAD (coronary artery disease)    a. LHC in 2011 for stable angina >> DES to pLAD [facility])  //  b. inf-post STEMI 7/15 c/b CHB with jxn escape >> LHC: OM1 60-70, pLAD 30-40, m-d RCA occluded, EF 50-55 >> PCI: 3 x 33 mm Xience DES to RCA  //  c. LHC 8/17: dLM 20, pLAD 20, pLAD stent ok, pRI 30, pRCA stent ok with 30% ISR  . Colon polyps   . Complication of anesthesia    no memories from right after last colonscopy/endoscopy may 2018  . GERD (gastroesophageal reflux disease)   . History of echocardiogram    Echo 12/15:  Mild LVH, EF 60-65%, normal wall motion, grade 1 diastolic dysfunction, LA upper limits of normal, mild RAE  . Hydrocele, left   . Hyperlipidemia    Myalgias with Zocor, Crestor, atorvastatin.   Marland Kitchen Hyperplasia of prostate   . Myocardial infarction ([facility]) 2014  . Nicotine abuse   . PAF (paroxysmal atrial fibrillation) [facility])    s/p ablation by Dr Roxan Hockey at [facility] in 2012 // Xarelto anticoagulation  //  ILR removed 2/17 // Event Monitor 11/17: Predominantly NSR (90%). Paroxysmal atrial fibrillation (10%).  Atrial fibrillation appears to be rate-controlled when it occurs.   . Pyloric stenosis     Past Surgical  History:  Procedure Laterality Date  . ATRIAL FIBRILLATION ABLATION  07/2010   Afib ablation by Dr Roxan Hockey at [facility] in 2012  . BALLOON DILATION N/A 07/31/2016   Procedure: BALLOON DILATION;  Surgeon: Manus Gunning, MD;  Location: [facility];  Service: Gastroenterology;  Laterality: N/A;  . BALLOON DILATION N/A 08/27/2016   Procedure: BALLOON DILATION;  Surgeon: Manus Gunning, MD;  Location: [facility] END[facility];  Service: Gastroenterology;  Laterality: N/A;  . CARDIAC CATHETERIZATION    . CARDIAC CATHETERIZATION N/A 08/17/2015   Procedure: Left Heart Cath and Coronary Angiography;  Surgeon: Larey Dresser, MD;  Location: [facility] CV LAB;  Service: Cardiovascular;  Laterality: N/A;  . colonscopy and endoscopy  05/2016  . CORONARY STENT PLACEMENT     x 2  . EP IMPLANTABLE DEVICE N/A 03/09/2015   Procedure: Loop Recorder Removal;  Surgeon: [name], MD;  Location: [place] [facility] CV LAB;  Service: Cardiovascular;  Laterality: N/A;  . ESOPHAGOGASTRODUODENOSCOPY N/A 07/31/2016   Procedure: ESOPHAGOGASTRODUODENOSCOPY (EGD) also have pediatric scope available;  Surgeon: Manus Gunning, MD;  Location: [facility] END[facility];  Service: Gastroenterology;  Laterality: N/A;  . ESOPHAGOGASTRODUODENOSCOPY N/A 08/27/2016   Procedure: ESOPHAGOGASTRODUODENOSCOPY (EGD);  Surgeon:  Armbruster, Renelda Loma, MD;  Location: [facility];  Service: Gastroenterology;  Laterality: N/A;  . implantable loop recorder implantation  08/13/2010   MDT Reveal XT implanted by Dr Roxan Hockey for afib management post ablation  . LEFT HEART CATH N/A 07/15/2013   Procedure: LEFT HEART CATH;  Surgeon: Clent Demark, MD;  Location: [facility] CATH LAB;  Service: Cardiovascular;  Laterality: N/A;  . TONSILLECTOMY AND ADENOIDECTOMY      Allergies  Allergen Reactions  . Clopidogrel Other (See Comments)    unknown reaction  . Crestor [Rosuvastatin Calcium] Other (See Comments)    Extreme muscular weakness  . Lipitor  [Atorvastatin Calcium] Other (See Comments)    Extreme muscular weakness   . Statins Other (See Comments)    Extreme muscular weakness     Current Outpatient Medications  Medication Sig Dispense Refill  . acetaminophen (TYLENOL) 325 MG tablet Take 325 mg by mouth every 6 (six) hours as needed (FOR PAIN.).    Marland Kitchen Alirocumab (PRALUENT) 75 MG/ML SOPN Inject 1 pen into the skin every 14 (fourteen) days. 2 pen 11  . Cholecalciferol (SM VITAMIN D3) 4000 units CAPS Take 4,000 Units by mouth daily.    Marland Kitchen ketoconazole (NIZORAL) 2 % cream Apply 1 application topically daily as needed for irritation. 15 g 1  . Melatonin 3 MG TABS Take 3 mg by mouth at bedtime.    Marland Kitchen Resveratrol 250 MG CAPS Take 250 mg by mouth 3 (three) times a week.     . rivaroxaban (XARELTO) 20 MG TABS tablet Take 1 tablet (20 mg total) by mouth daily with supper. 30 tablet 6   No current facility-administered medications for this visit.     Family History  Problem Relation Age of Onset  . Dementia Mother   . Heart disease Father   . Hyperlipidemia Father   . Colon cancer Paternal Grandmother   . Cancer Sister        pancreatic    Social History   Socioeconomic History  . Marital status: Married    Spouse name: Not on file  . Number of children: 1  . Years of education: Not on file  . Highest education level: Not on file  Social Needs  . Financial resource strain: Not on file  . Food insecurity - worry: Not on file  . Food insecurity - inability: Not on file  . Transportation needs - medical: Not on file  . Transportation needs - non-medical: Not on file  Occupational History  . Occupation: retired  Tobacco Use  . Smoking status: Former Smoker    Packs/day: 0.25    Years: 30.00    Pack years: 7.50    Types: Cigarettes    Last attempt to quit: 07/21/2003    Years since quitting: 13.6  . Smokeless tobacco: Never Used  Substance and Sexual Activity  . Alcohol use: Yes    Alcohol/week: 0.6 oz    Types: 1  Glasses of wine per week    Comment: occasional wine  . Drug use: No  . Sexual activity: Yes  Other Topics Concern  . Not on file  Social History Narrative   Pt lives in [place] with wife.  Retired from [employer].  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  One son one son     REVIEW OF SYSTEMS:   [X]  denotes positive finding, [ ]  denotes negative finding Cardiac  Comments:  Chest pain or chest pressure:    Shortness  of breath upon exertion:    Short of breath when lying flat:    Irregular heart rhythm: x afib      Vascular    Pain in calf, thigh, or hip brought on by ambulation:    Pain in feet at night that wakes you up from your sleep:     Blood clot in your veins:    Leg swelling:         Pulmonary    Oxygen at home:    Productive cough:     Wheezing:         Neurologic    Sudden weakness in arms or legs:     Sudden numbness in arms or legs:     Sudden onset of difficulty speaking or slurred speech:    Temporary loss of vision in one eye:     Problems with dizziness:         Gastrointestinal    Blood in stool:     Vomited blood:         Genitourinary    Burning when urinating:     Blood in urine:        Psychiatric    Major depression:         Hematologic    Bleeding problems:    Problems with blood clotting too easily:        Skin    Rashes or ulcers:        Constitutional    Fever or chills:      PHYSICAL EXAMINATION:  Vitals:   02/23/17 1237  BP: 128/88  Pulse: (!) 52  Resp: (!) 21  SpO2: 99%   Vitals:   02/23/17 1237  Weight: 164 lb 1.6 oz (74.4 kg)  Height: 5\' 10"  (1.778 m)   Body mass index is 23.55 kg/m.  General:  WDWN in NAD; vital signs documented above Gait: Not observed HENT: WNL, normocephalic Pulmonary: normal non-labored breathing , without Rales, rhonchi,  wheezing Cardiac: regularly irregular HR, without  Murmurs without carotid bruits Abdomen: soft, NT, aorta is slightly  palpable Skin: without rashes Vascular Exam/Pulses:  Right Left  Radial 2+ (normal) 2+ (normal)  Ulnar Unable to palpate  Unable to palpate   Popliteal Unable to palpate  Unable to palpate   DP Unable to palpate  Unable to palpate   PT Faintly palpable 2+ (normal)   Extremities: without ischemic changes, without Gangrene , without cellulitis; without open wounds;  Musculoskeletal: no muscle wasting or atrophy  Neurologic: A&O X 3;  No focal weakness or paresthesias are detected Psychiatric:  The pt has Normal affect.   Non-Invasive Vascular Imaging:   CTA 01/30/17: IMPRESSION: Vascular:  Maximal diameter of the ascending thoracic aorta is 3.9 cm. No follow-up is recommended at this time. Atherosclerotic changes are noted without dissection.  Infrarenal abdominal aortic aneurysm is present. Maximal diameter is increased from 3.7 cm to 3.9 cm. Recommend followup by ultrasound in 2 years. This recommendation follows ACR consensus guidelines: White Paper of the ACR Incidental Findings Committee II on Vascular Findings. J Am Coll Radiol 2013; 10:789-794.  Critical narrowing at the origin of the left renal artery.  Critical narrowing at the origin of the left renal artery.  Significant narrowing just beyond the origin of the celiac axis. SMA is patent.  Right common iliac artery maximal diameter is 1.9 cm.  Nonvascular:  5 mm right middle lobe nodule is stable. Continued annual follow-up is recommended.  New 6 mm right upper  lobe pulmonary nodule. Non-contrast chest CT at 6-12 months is recommended. If the nodule is stable at time of repeat CT, then future CT at 18-24 months (from today's scan) is considered optional for low-risk patients, but is recommended for high-risk patients. This recommendation follows the consensus statement: Guidelines for Management of Incidental Pulmonary Nodules Detected on CT Images: From the Fleischner Society 2017;  Radiology 2017; 284:228-243.  Bilateral scrotal hydrocele.  Pt meds includes: Statin:  No.-intolerance  Beta Blocker:  No. Aspirin:  No. ACEI:  No. ARB:  No. CCB use:  No Other Antiplatelet/Anticoagulant:  Yes Xarelto for Afib   ASSESSMENT/PLAN:: 72 y.o. male with known AAA    -CTA on 01/30/17 revealed that the AAA is basically the same in size since his last scan, which was June 2018.   -He does have a narrowing of his left renal artery-blood pressure today is good at 128/88 and he is not on antihypertensive medications.   -He wasn't scheduled for a CT can until June, however, Dr. Lake Bells wanted to get a CT of the chest and wanted to coordinate to limit radiation exposure.  He does have a new 71mm RUL nodule and non contrast chest CT is recommended in 6-12 months per radiology.  He does not have a f/u appointment with Dr. Lake Bells -this will need to be scheduled.   -he will f/u in 1 year with abdominal u/s to evaluate AAA.  He will also have a renal artery duplex at that time.     Leontine Locket, PA-C [facility] [phone]  Clinic MD:  Pt seen and examined with Dr. Trula Slade  I agree with the above.  The patient will follow-up in 1 year with a repeat abdominal ultrasound.  We are trying to schedule appointment with pulmonary for follow-up of his CT chest.  Annamarie Major

## 2017-04-08 ENCOUNTER — Encounter: Payer: Self-pay | Admitting: Family Medicine

## 2017-04-08 LAB — HM DIABETES EYE EXAM

## 2017-04-13 ENCOUNTER — Telehealth: Payer: Self-pay | Admitting: Family Medicine

## 2017-04-13 ENCOUNTER — Telehealth (HOSPITAL_COMMUNITY): Payer: Self-pay | Admitting: Vascular Surgery

## 2017-04-13 MED ORDER — ALIROCUMAB 75 MG/ML ~~LOC~~ SOPN: 1.0000 "pen " | PEN_INJECTOR | SUBCUTANEOUS | 11 refills | Status: DC

## 2017-04-13 NOTE — Telephone Encounter (Signed)
Pt aware.

## 2017-04-13 NOTE — Telephone Encounter (Signed)
Sent message to ch st to get pt established w/ general Card

## 2017-06-02 ENCOUNTER — Other Ambulatory Visit: Payer: Self-pay | Admitting: *Deleted

## 2017-06-02 ENCOUNTER — Telehealth: Payer: Self-pay | Admitting: *Deleted

## 2017-06-02 DIAGNOSIS — I2510 Atherosclerotic heart disease of native coronary artery without angina pectoris: Secondary | ICD-10-CM

## 2017-06-02 DIAGNOSIS — E7800 Pure hypercholesterolemia, unspecified: Secondary | ICD-10-CM

## 2017-06-02 DIAGNOSIS — R972 Elevated prostate specific antigen [PSA]: Secondary | ICD-10-CM

## 2017-06-02 DIAGNOSIS — E559 Vitamin D deficiency, unspecified: Secondary | ICD-10-CM

## 2017-06-02 DIAGNOSIS — I10 Essential (primary) hypertension: Secondary | ICD-10-CM

## 2017-06-02 DIAGNOSIS — I480 Paroxysmal atrial fibrillation: Secondary | ICD-10-CM

## 2017-06-02 NOTE — Telephone Encounter (Signed)
Please refer vascular surgeon for further evaluation of circulation to lower extremities.

## 2017-06-02 NOTE — Telephone Encounter (Signed)
These do referral to Dr. Trula Slade for further evaluation Also let patient know.

## 2017-06-02 NOTE — Telephone Encounter (Signed)
Pt called - he prefers to wait and discuss this with [facility] at up-coming appt

## 2017-06-02 NOTE — Telephone Encounter (Signed)
TC from [facility] case mgr Performed PAD test  right foot 0.86 left foot 0.28

## 2017-06-09 ENCOUNTER — Encounter: Payer: Self-pay | Admitting: Physician Assistant

## 2017-06-09 ENCOUNTER — Ambulatory Visit: Payer: Medicare Other | Admitting: Physician Assistant

## 2017-06-09 ENCOUNTER — Other Ambulatory Visit: Payer: Medicare Other

## 2017-06-09 VITALS — BP 142/80 | HR 58 | Ht 70.0 in | Wt 167.0 lb

## 2017-06-09 DIAGNOSIS — I480 Paroxysmal atrial fibrillation: Secondary | ICD-10-CM

## 2017-06-09 DIAGNOSIS — E7800 Pure hypercholesterolemia, unspecified: Secondary | ICD-10-CM | POA: Diagnosis not present

## 2017-06-09 DIAGNOSIS — Z789 Other specified health status: Secondary | ICD-10-CM

## 2017-06-09 DIAGNOSIS — I2510 Atherosclerotic heart disease of native coronary artery without angina pectoris: Secondary | ICD-10-CM

## 2017-06-09 DIAGNOSIS — I10 Essential (primary) hypertension: Secondary | ICD-10-CM

## 2017-06-09 DIAGNOSIS — R972 Elevated prostate specific antigen [PSA]: Secondary | ICD-10-CM

## 2017-06-09 DIAGNOSIS — E559 Vitamin D deficiency, unspecified: Secondary | ICD-10-CM

## 2017-06-09 NOTE — Patient Instructions (Signed)
Medication Instructions:  1. Your physician recommends that you continue on your current medications as directed. Please refer to the Current Medication list given to you today.   Labwork: NONE ORDERED TODAY  Testing/Procedures: NONE ORDERED TODAY  Follow-Up: Your physician wants you to follow-up in: 1 YEAR WITH DR. Acie Fredrickson  You will receive a reminder letter in the mail two months in advance. If you don't receive a letter, please call our office to schedule the follow-up appointment.   Any Other Special Instructions Will Be Listed Below (If Applicable).     If you need a refill on your cardiac medications before your next appointment, please call your pharmacy.

## 2017-06-09 NOTE — Progress Notes (Addendum)
Cardiology Office Note:    Date:  06/09/2017   ID:  Dustin Bauer, DOB 11-Sep-1945, MRN [id number]  PCP:  Chipper Herb, MD  Cardiologist:  Mertie Moores, MD   Referring MD: Chipper Herb, MD   Chief Complaint  Patient presents with  . Coronary Artery Disease    follow up, annual    History of Present Illness:    Dustin Bauer is a 72 y.o. male with a hx of paroxysmal atrial fibrillation s/p ablation at [facility], PCI to LAD 2011, inferoposterior STEMI 2015 with DES to totally occluded RCA with EF  53-29% complicated by complete heart block which subsequently resolved without PPM. Repeat LHC 2017 with patent LAD and RCA stents. HR in the 40s. Heart monitor negative for bradycardia but showed Afib about 10% of the time. He is now anticoagulated. At his last visit at [facility] clinic with Doristine Devoid 06/04/16, he was switched to eliquis for abdominal issues, but saw no improvement and switched back to once daily dosing of xarelto. Per AVS in 2017, he was supposed to follow up with Loralie Champagne in 1 year.  He returns today for follow up. He is here alone. Pressures at home is in the 120s/70s. He can become dizzy when standing from sitting position on occasion, but states this is not bothersome. He goes to the gym 2-3 times weekly and walks daily. He sometimes has some dyspnea on exertion during exercise when he over-exerts himself at the gym. This is relieved when he rests. He also takes breaks when mowing the yard, which he attributes to aging. His anginal equivalent prior to his last MI was syncope. He is at baseline bradycardic.  He denies chest pain, shortness of breath at rest, lightheadedness, lower extremity swelling, orthopnea, and syncope. He complains of skin tears and bruising, but no frank bleeding on xarelto.   He would like to follow up with a cardiologist at [facility] rather than following up at a hospital-based practice with Dr. Aundra Dubin.   Case discussed with Dr. Acie Fredrickson (DOD).   Past  Medical History:  Diagnosis Date  . Allergy to ACE inhibitors    Cough and fatigue with ACEI  . CAD (coronary artery disease)    a. LHC in 2011 for stable angina >> DES to pLAD [facility])  //  b. inf-post STEMI 7/15 c/b CHB with jxn escape >> LHC: OM1 60-70, pLAD 30-40, m-d RCA occluded, EF 50-55 >> PCI: 3 x 33 mm Xience DES to RCA  //  c. LHC 8/17: dLM 20, pLAD 20, pLAD stent ok, pRI 30, pRCA stent ok with 30% ISR  . Colon polyps   . Complication of anesthesia    no memories from right after last colonscopy/endoscopy may 2018  . GERD (gastroesophageal reflux disease)   . History of echocardiogram    Echo 12/15:  Mild LVH, EF 60-65%, normal wall motion, grade 1 diastolic dysfunction, LA upper limits of normal, mild RAE  . Hydrocele, left   . Hyperlipidemia    Myalgias with Zocor, Crestor, atorvastatin.   Marland Kitchen Hyperplasia of prostate   . Myocardial infarction ([place]) 2014  . Nicotine abuse   . PAF (paroxysmal atrial fibrillation) [facility])    s/p ablation by Dr Roxan Hockey at [facility] in 2012 // Xarelto anticoagulation  //  ILR removed 2/17 // Event Monitor 11/17: Predominantly NSR (90%). Paroxysmal atrial fibrillation (10%).  Atrial fibrillation appears to be rate-controlled when it occurs.   . Pyloric stenosis  Past Surgical History:  Procedure Laterality Date  . ATRIAL FIBRILLATION ABLATION  07/2010   Afib ablation by Dr Roxan Hockey at [facility] in 2012  . BALLOON DILATION N/A 07/31/2016   Procedure: BALLOON DILATION;  Surgeon: Manus Gunning, MD;  Location: [facility];  Service: Gastroenterology;  Laterality: N/A;  . BALLOON DILATION N/A 08/27/2016   Procedure: BALLOON DILATION;  Surgeon: Manus Gunning, MD;  Location: [facility];  Service: Gastroenterology;  Laterality: N/A;  . CARDIAC CATHETERIZATION    . CARDIAC CATHETERIZATION N/A 08/17/2015   Procedure: Left Heart Cath and Coronary Angiography;  Surgeon: Larey Dresser, MD;  Location: [place] CV LAB;  Service:  Cardiovascular;  Laterality: N/A;  . colonscopy and endoscopy  05/2016  . CORONARY STENT PLACEMENT     x 2  . EP IMPLANTABLE DEVICE N/A 03/09/2015   Procedure: Loop Recorder Removal;  Surgeon: [name], MD;  Location: [place] CV LAB;  Service: Cardiovascular;  Laterality: N/A;  . ESOPHAGOGASTRODUODENOSCOPY N/A 07/31/2016   Procedure: ESOPHAGOGASTRODUODENOSCOPY (EGD) also have pediatric scope available;  Surgeon: Manus Gunning, MD;  Location: [facility];  Service: Gastroenterology;  Laterality: N/A;  . ESOPHAGOGASTRODUODENOSCOPY N/A 08/27/2016   Procedure: ESOPHAGOGASTRODUODENOSCOPY (EGD);  Surgeon: Manus Gunning, MD;  Location: [facility];  Service: Gastroenterology;  Laterality: N/A;  . implantable loop recorder implantation  08/13/2010   MDT Reveal XT implanted by Dr Roxan Hockey for afib management post ablation  . LEFT HEART CATH N/A 07/15/2013   Procedure: LEFT HEART CATH;  Surgeon: Clent Demark, MD;  Location[facility] CATH LAB;  Service: Cardiovascular;  Laterality: N/A;  . TONSILLECTOMY AND ADENOIDECTOMY      Current Medications: Current Meds  Medication Sig  . acetaminophen (TYLENOL) 325 MG tablet Take 325 mg by mouth every 6 (six) hours as needed (FOR PAIN.).  Marland Kitchen Alirocumab (PRALUENT) 75 MG/ML SOPN Inject 1 pen into the skin every 14 (fourteen) days.  . Cholecalciferol (SM VITAMIN D3) 4000 units CAPS Take 4,000 Units by mouth daily.  Marland Kitchen ketoconazole (NIZORAL) 2 % cream Apply 1 application topically daily as needed for irritation.  . Melatonin 3 MG TABS Take 3 mg by mouth at bedtime.  Marland Kitchen Resveratrol 250 MG CAPS Take 250 mg by mouth 3 (three) times a week.   . rivaroxaban (XARELTO) 20 MG TABS tablet Take 1 tablet (20 mg total) by mouth daily with supper.     Allergies:   Clopidogrel; Crestor [rosuvastatin calcium]; Lipitor [atorvastatin calcium]; and Statins   Social History   Socioeconomic History  . Marital status: Married    Spouse name: Not on file    . Number of children: 1  . Years of education: Not on file  . Highest education level: Not on file  Occupational History  . Occupation: retired  Scientific laboratory technician  . Financial resource strain: Not on file  . Food insecurity:    Worry: Not on file    Inability: Not on file  . Transportation needs:    Medical: Not on file    Non-medical: Not on file  Tobacco Use  . Smoking status: Former Smoker    Packs/day: 0.25    Years: 30.00    Pack years: 7.50    Types: Cigarettes    Last attempt to quit: 07/21/2003    Years since quitting: 13.8  . Smokeless tobacco: Never Used  Substance and Sexual Activity  . Alcohol use: Yes    Alcohol/week: 0.6 oz    Types: 1 Glasses of wine  per week    Comment: occasional wine  . Drug use: No  . Sexual activity: Yes  Lifestyle  . Physical activity:    Days per week: Not on file    Minutes per session: Not on file  . Stress: Not on file  Relationships  . Social connections:    Talks on phone: Not on file    Gets together: Not on file    Attends religious service: Not on file    Active member of club or organization: Not on file    Attends meetings of clubs or organizations: Not on file    Relationship status: Not on file  Other Topics Concern  . Not on file  Social History Narrative   Pt lives in [place] with wife.  Retired from department of transportation.  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  One son one son     Family History: The patient's family history includes Cancer in his sister; Colon cancer in his paternal grandmother; Dementia in his mother; Heart disease in his father; Hyperlipidemia in his father.  ROS:   Please see the history of present illness.     All other systems reviewed and are negative.  EKGs/Labs/Other Studies Reviewed:    The following studies were reviewed today:  ETT 10/2015 Normal heart rate response. No further workup. Artifact on EKG  EKG:  EKG is ordered today.  The ekg ordered today  demonstrates bradycardia with 1st degree heart block and nonspecific ST changes that are not new  Recent Labs: 06/11/2016: ALT 12; BUN 18; Creatinine, Ser 1.04; Hemoglobin 13.7; Platelets 196; Potassium 4.7; Sodium 142  Recent Lipid Panel    Component Value Date/Time   CHOL 106 06/11/2016 0911   CHOL 100 (L) 12/28/2014 1609   TRIG 70 06/11/2016 0911   TRIG 95 06/04/2015 1501   HDL 48 06/11/2016 0911   HDL 55 06/04/2015 1501   CHOLHDL 2.2 06/11/2016 0911   CHOLHDL 2.2 11/27/2015 1636   VLDL 17 11/27/2015 1636   LDLCALC 44 06/11/2016 0911   LDLCALC 33 12/28/2014 1609    Physical Exam:    VS:  BP (!) 142/80   Pulse (!) 58   Ht 5\' 10"  (1.778 m)   Wt 167 lb (75.8 kg)   SpO2 99%   BMI 23.96 kg/m     Wt Readings from Last 3 Encounters:  06/09/17 167 lb (75.8 kg)  02/23/17 164 lb 1.6 oz (74.4 kg)  12/10/16 161 lb (73 kg)     GEN: Well nourished, well developed in no acute distress HEENT: Normal NECK: No JVD; No carotid bruits CARDIAC: RRR, no murmurs, rubs, gallops RESPIRATORY:  Clear to auscultation without rales, wheezing or rhonchi  ABDOMEN: Soft, non-tender, non-distended MUSCULOSKELETAL:  No edema; No deformity  SKIN: Warm and dry NEUROLOGIC:  Alert and oriented x 3 PSYCHIATRIC:  Normal affect   ASSESSMENT:    1. Coronary artery disease involving native coronary artery of native heart without angina pectoris   2. Paroxysmal atrial fibrillation (HCC)   3. Pure hypercholesterolemia   4. Statin intolerance    PLAN:    In order of problems listed above:  Coronary artery disease involving native coronary artery of native heart without angina pectoris  No chest pain, shortness of breath, or syncope. He gets short of breath when he over-exerts himself at the gym. I consulted with Dr. Acie Fredrickson who agrees that without symptoms, he would not be a good candidate for a myoview,.  Given his stents, CT coronary is not a good option. Without worsening dyspnea or near-syncopal  episodes (his anginal equivalent), will not pursue cardiac catheterizations. If he becomes symptomatic, will pursue heart cath. ETT is not a good option given his baseline ST changes. Discussed with the patient primary prevention, including weight control, cholesterol, and BP control.   Paroxysmal atrial fibrillation (HCC) No palpitations, no syncope. No bleeding problems. Continues xarelto.  This patients CHA2DS2-VASc Score and unadjusted Ischemic Stroke Rate (% per year) is equal to 2.2 % stroke rate/year from a score of 2 (age, MI) Will look for CBC obtained this morning for PCP labs.    Pure hypercholesterolemia Statin intolerance 06/11/2016: Cholesterol, Total 106; HDL 48; LDL Calculated 44; Triglycerides 70 Pt on praluent and doing well. He states he had blood work this morning for his PCP. Will look for those results in Epic.    He will follow up with Dr. Acie Fredrickson in 1 year.   Addendum 06/10/17: Labs from PCP reviewed today. CBC, LFTs, and Lipids are all WNL. No changes to cardiac regimen.   Medication Adjustments/Labs and Tests Ordered: Current medicines are reviewed at length with the patient today.  Concerns regarding medicines are outlined above.  Orders Placed This Encounter  Procedures  . EKG 12-Lead   No orders of the defined types were placed in this encounter.   Signed, Ledora Bottcher, PA  06/09/2017 5:10 PM    [facility]

## 2017-06-10 ENCOUNTER — Encounter: Payer: Self-pay | Admitting: Family Medicine

## 2017-06-10 ENCOUNTER — Ambulatory Visit: Payer: Medicare Other | Admitting: Family Medicine

## 2017-06-10 VITALS — BP 122/80 | HR 70 | Temp 97.3°F | Ht 70.0 in | Wt 166.0 lb

## 2017-06-10 DIAGNOSIS — I714 Abdominal aortic aneurysm, without rupture, unspecified: Secondary | ICD-10-CM

## 2017-06-10 DIAGNOSIS — Z0000 Encounter for general adult medical examination without abnormal findings: Secondary | ICD-10-CM | POA: Diagnosis not present

## 2017-06-10 DIAGNOSIS — I480 Paroxysmal atrial fibrillation: Secondary | ICD-10-CM

## 2017-06-10 DIAGNOSIS — Z800 Family history of malignant neoplasm of digestive organs: Secondary | ICD-10-CM

## 2017-06-10 DIAGNOSIS — E7800 Pure hypercholesterolemia, unspecified: Secondary | ICD-10-CM

## 2017-06-10 DIAGNOSIS — E559 Vitamin D deficiency, unspecified: Secondary | ICD-10-CM

## 2017-06-10 DIAGNOSIS — R972 Elevated prostate specific antigen [PSA]: Secondary | ICD-10-CM

## 2017-06-10 DIAGNOSIS — I2510 Atherosclerotic heart disease of native coronary artery without angina pectoris: Secondary | ICD-10-CM

## 2017-06-10 DIAGNOSIS — N433 Hydrocele, unspecified: Secondary | ICD-10-CM

## 2017-06-10 DIAGNOSIS — R918 Other nonspecific abnormal finding of lung field: Secondary | ICD-10-CM

## 2017-06-10 DIAGNOSIS — I10 Essential (primary) hypertension: Secondary | ICD-10-CM

## 2017-06-10 DIAGNOSIS — K311 Adult hypertrophic pyloric stenosis: Secondary | ICD-10-CM

## 2017-06-10 LAB — PSA, TOTAL AND FREE
PSA FREE PCT: 11.3 %
PSA, Free: 0.43 ng/mL
Prostate Specific Ag, Serum: 3.8 ng/mL (ref 0.0–4.0)

## 2017-06-10 LAB — CBC WITH DIFFERENTIAL/PLATELET
BASOS ABS: 0 10*3/uL (ref 0.0–0.2)
Basos: 0 %
EOS (ABSOLUTE): 0.4 10*3/uL (ref 0.0–0.4)
Eos: 6 %
Hematocrit: 45.2 % (ref 37.5–51.0)
Hemoglobin: 15.1 g/dL (ref 13.0–17.7)
IMMATURE GRANULOCYTES: 0 %
Immature Grans (Abs): 0 10*3/uL (ref 0.0–0.1)
LYMPHS ABS: 2.6 10*3/uL (ref 0.7–3.1)
Lymphs: 35 %
MCH: 31.6 pg (ref 26.6–33.0)
MCHC: 33.4 g/dL (ref 31.5–35.7)
MCV: 95 fL (ref 79–97)
MONOS ABS: 0.5 10*3/uL (ref 0.1–0.9)
Monocytes: 8 %
NEUTROS PCT: 51 %
Neutrophils Absolute: 3.7 10*3/uL (ref 1.4–7.0)
PLATELETS: 219 10*3/uL (ref 150–450)
RBC: 4.78 x10E6/uL (ref 4.14–5.80)
RDW: 15.3 % (ref 12.3–15.4)
WBC: 7.2 10*3/uL (ref 3.4–10.8)

## 2017-06-10 LAB — URINALYSIS, COMPLETE
Bilirubin, UA: NEGATIVE
Glucose, UA: NEGATIVE
Leukocytes, UA: NEGATIVE
NITRITE UA: NEGATIVE
PH UA: 6.5 (ref 5.0–7.5)
Protein, UA: NEGATIVE
Specific Gravity, UA: 1.02 (ref 1.005–1.030)
Urobilinogen, Ur: 0.2 mg/dL (ref 0.2–1.0)

## 2017-06-10 LAB — BMP8+EGFR
BUN/Creatinine Ratio: 15 (ref 10–24)
BUN: 15 mg/dL (ref 8–27)
CALCIUM: 9.8 mg/dL (ref 8.6–10.2)
CHLORIDE: 106 mmol/L (ref 96–106)
CO2: 23 mmol/L (ref 20–29)
Creatinine, Ser: 1.02 mg/dL (ref 0.76–1.27)
GFR calc Af Amer: 84 mL/min/{1.73_m2} (ref >59)
GFR calc non Af Amer: 73 mL/min/{1.73_m2} (ref >59)
GLUCOSE: 94 mg/dL (ref 65–99)
Potassium: 4.7 mmol/L (ref 3.5–5.2)
SODIUM: 142 mmol/L (ref 134–144)

## 2017-06-10 LAB — HEPATIC FUNCTION PANEL
ALT: 20 IU/L (ref 0–44)
AST: 22 IU/L (ref 0–40)
Albumin: 4.1 g/dL (ref 3.5–4.8)
Alkaline Phosphatase: 91 IU/L (ref 39–117)
BILIRUBIN TOTAL: 0.4 mg/dL (ref 0.0–1.2)
Bilirubin, Direct: 0.12 mg/dL (ref 0.00–0.40)
TOTAL PROTEIN: 6.2 g/dL (ref 6.0–8.5)

## 2017-06-10 LAB — LIPID PANEL
CHOL/HDL RATIO: 2.4 ratio (ref 0.0–5.0)
Cholesterol, Total: 120 mg/dL (ref 100–199)
HDL: 49 mg/dL (ref >39)
LDL CALC: 59 mg/dL (ref 0–99)
Triglycerides: 59 mg/dL (ref 0–149)
VLDL CHOLESTEROL CAL: 12 mg/dL (ref 5–40)

## 2017-06-10 LAB — MICROSCOPIC EXAMINATION
Bacteria, UA: NONE SEEN
EPITHELIAL CELLS (NON RENAL): NONE SEEN /HPF (ref 0–10)
RENAL EPITHEL UA: NONE SEEN /HPF
WBC, UA: NONE SEEN /hpf (ref 0–5)

## 2017-06-10 LAB — VITAMIN D 25 HYDROXY (VIT D DEFICIENCY, FRACTURES): VIT D 25 HYDROXY: 31.8 ng/mL (ref 30.0–100.0)

## 2017-06-10 MED ORDER — NAFTIFINE HCL 2 % EX GEL: CUTANEOUS | 2 refills | Status: DC

## 2017-06-10 NOTE — Progress Notes (Signed)
Subjective:    Patient ID: Dustin Bauer, male    DOB: April 12, 1945, 72 y.o.   MRN: [id number]  HPI Patient is here today for annual wellness exam and follow up of chronic medical problems which includes hypertension and hyperlipidemia. He is taking medication regularly.  The patient is doing well overall.  He just saw his cardiologist yesterday and has a return appointment for 1 year.  He is intolerant to statin drugs has had an MI in the past and is currently on Praluent.  He is also on Xarelto.  The note from the cardiologist was reviewed.  The patient is also followed by the vascular surgeon because of abdominal aortic aneurysm and he is recently been seen by the gastroenterologist and had dilatation of the severe pyloric stenosis and this was done last summer.  The patient also has a history of paroxysmal atrial fibrillation.  The patient's recent lab work was reviewed with him.  All his cholesterol numbers were good and at goal.  Creatinine and electrolytes were good.  CBC was within normal limits.  All liver function tests were normal.  Recent CT scans were reviewed because patient does have pulmonary nodules.  He also has an infrarenal aortic aneurysm that is being followed by the vascular surgeon.  The 2 of these specialists are coordinating together there is studies so that everything is evaluated at the appropriate time.  The patient just saw the cardiologist or his PA yesterday and our visit was planned again for about 1 year.  The patient does describe these weak spells that seem to occur when he is standing or sitting.  He has shortness of breath at times not all the time when he is mowing or doing physical activity.  He does not have the weak spells with activity.  He denies any chest pain.  He denies any trouble currently with swallowing heartburn nausea vomiting diarrhea blood in the stool or black tarry bowel movements.  The patient does have a follow-up appointment with his gastroenterologist  later this summer.  He is passing his water well though it is slow and passing.  In conjunction with that his PSA was stable and up only 1/10 of a point from 1 year ago at 3.8.     Patient Active Problem List   Diagnosis Date Noted  . Pyloric stricture   . Nausea and vomiting 05/01/2016  . History of colonic polyps 05/01/2016  . Dysphagia 05/01/2016  . Chronic anticoagulation 05/01/2016  . History of heart block 08/13/2015  . Dyspnea 06/13/2015  . Multiple pulmonary nodules 06/13/2015  . Hypertensive cardiovascular disease 04/17/2014  . CAD (coronary artery disease) 12/16/2013  . History of acute inferior wall myocardial infarction 07/15/2013  . Abnormal chest CT 05/30/2013  . Elevated PSA 05/30/2013  . Hyperlipidemia 06/24/2012  . Paroxysmal atrial fibrillation ([place]) 06/24/2012  . Statin intolerance 06/24/2012   Outpatient Encounter Medications as of 06/10/2017  Medication Sig  . acetaminophen (TYLENOL) 325 MG tablet Take 325 mg by mouth every 6 (six) hours as needed (FOR PAIN.).  Marland Kitchen Alirocumab (PRALUENT) 75 MG/ML SOPN Inject 1 pen into the skin every 14 (fourteen) days.  . Cholecalciferol (SM VITAMIN D3) 4000 units CAPS Take 4,000 Units by mouth daily.  Marland Kitchen ketoconazole (NIZORAL) 2 % cream Apply 1 application topically daily as needed for irritation.  . Melatonin 3 MG TABS Take 3 mg by mouth at bedtime.  Marland Kitchen Resveratrol 250 MG CAPS Take 250 mg by mouth 3 (three) times  a week.   . rivaroxaban (XARELTO) 20 MG TABS tablet Take 1 tablet (20 mg total) by mouth daily with supper.   No facility-administered encounter medications on file as of 06/10/2017.      Review of Systems  Constitutional: Negative.   HENT: Negative.   Eyes: Negative.   Respiratory: Negative.   Cardiovascular: Negative.   Gastrointestinal: Negative.   Endocrine: Negative.   Genitourinary: Negative.   Musculoskeletal: Negative.   Skin: Negative.   Allergic/Immunologic: Negative.   Neurological: Negative.     Hematological: Negative.   Psychiatric/Behavioral: Negative.        Objective:   Physical Exam  Constitutional: He is oriented to person, place, and time. He appears well-developed and well-nourished. No distress.  Pleasant and alert and seems to be keeping up with his specialty appointments appropriately.  HENT:  Head: Normocephalic and atraumatic.  Right Ear: External ear normal.  Left Ear: External ear normal.  Mouth/Throat: Oropharynx is clear and moist. No oropharyngeal exudate.  Nasal turbinate congestion bilaterally but patient says this is getting better.  Eyes: Pupils are equal, round, and reactive to light. Conjunctivae and EOM are normal. Right eye exhibits no discharge. Left eye exhibits no discharge. No scleral icterus.  Neck: Normal range of motion. Neck supple. No thyromegaly present.  No bruits thyromegaly or anterior cervical adenopathy  Cardiovascular: Normal rate, normal heart sounds and intact distal pulses.  No murmur heard. Heart has an irregular irregular rhythm at 56/min.  Pulmonary/Chest: Effort normal and breath sounds normal. He has no wheezes. He has no rales. He exhibits no tenderness.  Clear anteriorly and posteriorly no axillary adenopathy chest wall masses not present.  Abdominal: Soft. Bowel sounds are normal. He exhibits no mass. There is no tenderness.  No liver or spleen enlargement no epigastric tenderness no masses  Genitourinary: Rectum normal and penis normal.  Genitourinary Comments: Prostate is enlarged and firm bilaterally.  The left side seems to be larger than the right side.  There were no rectal masses.  The external genitalia were within normal limits the patient has bilateral hydroceles.  Musculoskeletal: Normal range of motion. He exhibits no edema or tenderness.  Lymphadenopathy:    He has no cervical adenopathy.  Neurological: He is alert and oriented to person, place, and time. He has normal reflexes. No cranial nerve deficit.   Skin: Skin is warm and dry. No rash noted.  Psychiatric: He has a normal mood and affect. His behavior is normal. Judgment and thought content normal.  Normal mood and affect and patient continues to worry about his cardiac status.  Nursing note and vitals reviewed.  BP 122/80 (BP Location: Left Arm)   Pulse 70   Temp (!) 97.3 F (36.3 C) (Oral)   Ht 5\' 10"  (1.778 m)   Wt 166 lb (75.3 kg)   BMI 23.82 kg/m          Assessment & Plan:  1. Annual physical exam -The patient is doing well overall despite his many diagnoses with visits to specialist. - Urinalysis, Complete  2. Pure hypercholesterolemia -All cholesterol numbers were good and at goal with using purulent and he will continue with this.  3. Essential hypertension -The blood pressure is good and he will continue with current treatment  4. Vitamin D deficiency -The patient was encouraged to continue his vitamin D and to take it regularly at 5000 units daily  5. Paroxysmal atrial fibrillation (HCC) -The patient is in atrial fibrillation today at about 56/min  6. ASCVD (arteriosclerotic cardiovascular disease) -Continue follow-up with cardiology  7. Elevated PSA -The PSA is stable from 1 year ago and is only up 1/10 of a point - Urinalysis, Complete  8. Pyloric stricture -Follow-up with gastroenterology as planned  9. Multiple pulmonary nodules -Follow-up with pulmonology as planned  10. Abdominal aortic aneurysm (AAA) without rupture ([place]) -Follow-up with vascular surgeon as planned  11. Hydrocele in adult -This is not given the patient any problems and he has had these hydroceles for a good while.  12. Family history of pancreatic cancer -Patient has had recent CT scans and has a diagnosis of pyloric stenosis and is following up regularly with the gastroenterologist because of this.  83. Family history of colon cancer -Patient is up-to-date on his colonoscopies.  14.  BPH -We will discuss the  patient's physical findings and PSA results with the urologist to see if he thinks further follow-up would be necessary.  This would be with Dr. Karsten Ro.  Patient Instructions                       Medicare Annual Wellness Visit  [facility] and the medical providers at [facility] strive to bring you the best medical care.  In doing so we not only want to address your current medical conditions and concerns but also to detect new conditions early and prevent illness, disease and health-related problems.    Medicare offers a yearly Wellness Visit which allows our clinical staff to assess your need for preventative services including immunizations, lifestyle education, counseling to decrease risk of preventable diseases and screening for fall risk and other medical concerns.    This visit is provided free of charge (no copay) for all Medicare recipients. The clinical pharmacists at [facility] have begun to conduct these Wellness Visits which will also include a thorough review of all your medications.    As you primary medical provider recommend that you make an appointment for your Annual Wellness Visit if you have not done so already this year.  You may set up this appointment before you leave today or you may call back ([phone]) and schedule an appointment.  Please make sure when you call that you mention that you are scheduling your Annual Wellness Visit with the clinical pharmacist so that the appointment may be made for the proper length of time.     Continue current medications. Continue good therapeutic lifestyle changes which include good diet and exercise. Fall precautions discussed with patient. If an FOBT was given today- please return it to our front desk. If you are over 70 years old - you may need Prevnar 12 or the adult Pneumonia vaccine.  **Flu shots are available--- please call and schedule a FLU-CLINIC appointment**  After your  visit with [place] today you will receive a survey in the mail or online from [employer] regarding your care with [place]. Please take a moment to fill this out. Your feedback is very important to [place] as you can help [place] better understand your patient needs as well as improve your experience and satisfaction. WE CARE ABOUT YOU!!!   Continue to follow-up with cardiology as planned Follow-up with gastroenterology as planned Follow-up with pulmonology as planned Follow-up with vascular surgeon as planned Over the next month document the frequency and length of time that you have your weakness episodes. Always drink plenty of water and do not over exercise when it is very high.  Arrie Senate MD

## 2017-06-10 NOTE — Patient Instructions (Addendum)
Medicare Annual Wellness Visit  [facility] and the medical providers at [facility] strive to bring you the best medical care.  In doing so we not only want to address your current medical conditions and concerns but also to detect new conditions early and prevent illness, disease and health-related problems.    Medicare offers a yearly Wellness Visit which allows our clinical staff to assess your need for preventative services including immunizations, lifestyle education, counseling to decrease risk of preventable diseases and screening for fall risk and other medical concerns.    This visit is provided free of charge (no copay) for all Medicare recipients. The clinical pharmacists at [facility] have begun to conduct these Wellness Visits which will also include a thorough review of all your medications.    As you primary medical provider recommend that you make an appointment for your Annual Wellness Visit if you have not done so already this year.  You may set up this appointment before you leave today or you may call back ([phone]) and schedule an appointment.  Please make sure when you call that you mention that you are scheduling your Annual Wellness Visit with the clinical pharmacist so that the appointment may be made for the proper length of time.     Continue current medications. Continue good therapeutic lifestyle changes which include good diet and exercise. Fall precautions discussed with patient. If an FOBT was given today- please return it to our front desk. If you are over 46 years old - you may need Prevnar 41 or the adult Pneumonia vaccine.  **Flu shots are available--- please call and schedule a FLU-CLINIC appointment**  After your visit with [place] today you will receive a survey in the mail or online from [employer] regarding your care with [place]. Please take a moment to fill this out. Your feedback is very  important to [place] as you can help [place] better understand your patient needs as well as improve your experience and satisfaction. WE CARE ABOUT YOU!!!   Continue to follow-up with cardiology as planned Follow-up with gastroenterology as planned Follow-up with pulmonology as planned Follow-up with vascular surgeon as planned Over the next month document the frequency and length of time that you have your weakness episodes. Always drink plenty of water and do not over exercise when it is very high.

## 2017-06-12 LAB — THYROID PANEL WITH TSH
FREE THYROXINE INDEX: 2.2 (ref 1.2–4.9)
T3 UPTAKE RATIO: 29 % (ref 24–39)
T4, Total: 7.6 ug/dL (ref 4.5–12.0)
TSH: 2.62 u[IU]/mL (ref 0.450–4.500)

## 2017-06-12 LAB — SPECIMEN STATUS REPORT

## 2017-08-05 ENCOUNTER — Telehealth: Payer: Self-pay | Admitting: Pulmonary Disease

## 2017-08-05 ENCOUNTER — Telehealth: Payer: Self-pay | Admitting: Physician Assistant

## 2017-08-05 ENCOUNTER — Encounter: Payer: Self-pay | Admitting: Internal Medicine

## 2017-08-05 DIAGNOSIS — R911 Solitary pulmonary nodule: Secondary | ICD-10-CM

## 2017-08-05 NOTE — Telephone Encounter (Signed)
Opened in error

## 2017-08-05 NOTE — Telephone Encounter (Signed)
Did not need this encounter °

## 2017-08-05 NOTE — Telephone Encounter (Signed)
Called and spoke with pt regarding scheduling appt for CT on chest. Pt was suppose to have CT back in March 2019  Routing a message to [employer] to f/u with pt to schedule CT Chest. Order has already been placed.

## 2017-08-06 ENCOUNTER — Other Ambulatory Visit: Payer: Self-pay | Admitting: Nurse Practitioner

## 2017-08-06 ENCOUNTER — Telehealth: Payer: Self-pay

## 2017-08-06 NOTE — Telephone Encounter (Signed)
The only order we have is for 9/19 no order for 3/19 Joellen Jersey

## 2017-08-06 NOTE — Telephone Encounter (Signed)
Patient called in and stated that he is having a biopsy done on his Prostate on Monday 08/10/17, and would like to know when he should stop his Xarelto. Advised patient I would ask Fabian Sharp PA.

## 2017-08-07 ENCOUNTER — Telehealth: Payer: Self-pay

## 2017-08-07 NOTE — Telephone Encounter (Signed)
   [facility] Pre-operative Risk Assessment    Request for surgical clearance:  1. What type of surgery is being performed? Prostate Biopsy  2. When is this surgery scheduled? 08/10/17  3. What type of clearance is required (medical clearance vs. Pharmacy clearance to hold med vs. Both)? Both  4. Are there any medications that need to be held prior to surgery and how long? Xarelto   5. Practice name and name of physician performing surgery? Leonidas Romberg, [facility]   6. What is your office phone number [phone]    7.   What is your office fax number [phone]  8.   Anesthesia type (None, local, MAC, general) ? Non specified    Dustin Bauer 08/07/2017, 11:30 AM  _________________________________________________________________   (provider comments below)

## 2017-08-07 NOTE — Telephone Encounter (Signed)
This patient was already cleared by prior note.  Kerin Ransom PA-C 08/07/2017 2:57 PM

## 2017-08-07 NOTE — Telephone Encounter (Addendum)
Pt takes Xarelto for afib with CHADS2VASc score of 3 (age, HTN, CAD). Renal function is normal. Ok to hold Xarelto for 2 days prior to prostate biopsy.

## 2017-08-07 NOTE — Telephone Encounter (Signed)
   Primary Cardiologist: Mertie Moores, MD  Chart reviewed and patient contacted today as part of pre-operative protocol coverage. Given past medical history and time since last visit, based on ACC/AHA guidelines, Dustin Bauer would be at acceptable risk for the planned procedure without further cardiovascular testing.   OK to hold Xarelto 48 hours pre op.  I will route this recommendation to the requesting party via Epic fax function and remove from pre-op pool.  Please call with questions.  Kerin Ransom, PA-C 08/07/2017, 1:28 PM

## 2017-08-07 NOTE — Telephone Encounter (Signed)
Attempted to contact patient to get further information regarding his clearance. I advised him to call the office where the procedure was to be completed and have them send [place] an official clearance. Patient got upset stating that he had tried that and they told him it was taken care of, and he just wanted to know when he should stop his Xarelto. I advised that we had a protocol that we had to go by and will be glad to assist him with the proper paperwork. Patient then stated he would cancel his biopsy, and we could watch him die and then proceeded to hang up the phone.

## 2017-08-07 NOTE — Telephone Encounter (Signed)
Spoke with nurse at [facility] regarding the information needed for medical clearance.   Information was sent to Preop Pool.

## 2017-08-18 ENCOUNTER — Ambulatory Visit (INDEPENDENT_AMBULATORY_CARE_PROVIDER_SITE_OTHER)
Admission: RE | Admit: 2017-08-18 | Discharge: 2017-08-18 | Disposition: A | Discharge status: Home / Self Care | Payer: Medicare Other | Source: Ambulatory Visit | Attending: Pulmonary Disease | Admitting: Pulmonary Disease

## 2017-08-18 DIAGNOSIS — J439 Emphysema, unspecified: Secondary | ICD-10-CM

## 2017-08-18 DIAGNOSIS — I700 Atherosclerosis of aorta: Secondary | ICD-10-CM

## 2017-08-18 DIAGNOSIS — R911 Solitary pulmonary nodule: Secondary | ICD-10-CM | POA: Diagnosis not present

## 2017-08-18 HISTORY — DX: Atherosclerosis of aorta: I70.0

## 2017-08-18 HISTORY — DX: Emphysema, unspecified: J43.9

## 2017-08-18 HISTORY — DX: Solitary pulmonary nodule: R91.1

## 2017-08-25 ENCOUNTER — Telehealth: Payer: Self-pay | Admitting: Family Medicine

## 2017-08-28 ENCOUNTER — Encounter: Payer: Self-pay | Admitting: Radiation Oncology

## 2017-09-08 ENCOUNTER — Encounter: Payer: Self-pay | Admitting: Radiation Oncology

## 2017-09-08 ENCOUNTER — Telehealth: Payer: Self-pay | Admitting: *Deleted

## 2017-09-08 ENCOUNTER — Other Ambulatory Visit: Payer: Self-pay

## 2017-09-08 NOTE — Telephone Encounter (Signed)
CALLED PATIENT TO ASK ABOUT COMING IN [facility] ON 09-10-17, SPOKE WITH PATIENT AND HE WILL BE HERE ON 09-10-17 @  2:30 PM FOR NURSE AND 3 PM FOR VISIT WITH DR. MANNING

## 2017-09-08 NOTE — Progress Notes (Signed)
GU Location of Tumor / Histology: prostatic adenocarcinoma  If Prostate Cancer, Gleason Score is (3 + 4) and PSA is (3.8). Prostate volume: 22 cc.   06/09/2017 PSA  3.8 12/04/2015 PSA  3.70 12/17/2013 PSA  3.23 06/25/2013 PSA  3.01 12/16/2012 PSA  3.5 10/28/2011 PSA  2.4  Dustin Bauer has been a patient of Dr. Simone Curia since 2014 when he was seen for a left hydrocele. Patient was referred back to Dr. Karsten Ro in July 2019 after Dr. Laurance Flatten (PCP) found a prostate nodule.   Biopsies of prostate (if applicable) revealed:    Past/Anticipated interventions by urology, if any: prostate biopsy, referral to radiation oncology for consideration of seeds  Past/Anticipated interventions by medical oncology, if any: no  Weight changes, if any: no  Bowel/Bladder complaints, if any: IPSS 12. SHIM 24. Denies dysuria, hematuria, urinary leakage or incontinence.   Nausea/Vomiting, if any: no  Pain issues, if any:  Denies new pain. Reports joint aches and pain related to age.   SAFETY ISSUES:  Prior radiation? no  Pacemaker/ICD? no  Possible current pregnancy? no  Is the patient on methotrexate? no  Current Complaints / other details:  72 year old male. Married. Resides in [place] 1 hour away. Prefers afternoon appointments. Maternal GM and Mother with hx of colon ca. Sister with hx of pancreatic cancer.

## 2017-09-09 ENCOUNTER — Ambulatory Visit: Payer: Medicare Other

## 2017-09-10 ENCOUNTER — Ambulatory Visit
Admission: RE | Admit: 2017-09-10 | Discharge: 2017-09-10 | Disposition: A | Discharge status: Home / Self Care | Payer: Medicare Other | Source: Ambulatory Visit | Attending: Radiation Oncology | Admitting: Radiation Oncology

## 2017-09-10 ENCOUNTER — Ambulatory Visit: Payer: Medicare Other

## 2017-09-10 ENCOUNTER — Encounter: Payer: Self-pay | Admitting: Medical Oncology

## 2017-09-10 ENCOUNTER — Encounter: Payer: Self-pay | Admitting: Radiation Oncology

## 2017-09-10 ENCOUNTER — Other Ambulatory Visit: Payer: Self-pay

## 2017-09-10 VITALS — BP 153/66 | HR 50 | Temp 97.9°F | Resp 20 | Ht 70.0 in | Wt 167.2 lb

## 2017-09-10 DIAGNOSIS — E785 Hyperlipidemia, unspecified: Secondary | ICD-10-CM | POA: Diagnosis not present

## 2017-09-10 DIAGNOSIS — Z87891 Personal history of nicotine dependence: Secondary | ICD-10-CM | POA: Diagnosis not present

## 2017-09-10 DIAGNOSIS — Z888 Allergy status to other drugs, medicaments and biological substances status: Secondary | ICD-10-CM | POA: Diagnosis not present

## 2017-09-10 DIAGNOSIS — I480 Paroxysmal atrial fibrillation: Secondary | ICD-10-CM | POA: Diagnosis not present

## 2017-09-10 DIAGNOSIS — I2510 Atherosclerotic heart disease of native coronary artery without angina pectoris: Secondary | ICD-10-CM | POA: Diagnosis not present

## 2017-09-10 DIAGNOSIS — Z79899 Other long term (current) drug therapy: Secondary | ICD-10-CM | POA: Diagnosis not present

## 2017-09-10 DIAGNOSIS — C61 Malignant neoplasm of prostate: Secondary | ICD-10-CM

## 2017-09-10 DIAGNOSIS — Z7901 Long term (current) use of anticoagulants: Secondary | ICD-10-CM | POA: Diagnosis not present

## 2017-09-10 DIAGNOSIS — I252 Old myocardial infarction: Secondary | ICD-10-CM | POA: Diagnosis not present

## 2017-09-10 DIAGNOSIS — Z8546 Personal history of malignant neoplasm of prostate: Secondary | ICD-10-CM | POA: Insufficient documentation

## 2017-09-10 DIAGNOSIS — Z955 Presence of coronary angioplasty implant and graft: Secondary | ICD-10-CM | POA: Diagnosis not present

## 2017-09-10 HISTORY — DX: Malignant neoplasm of prostate: C61

## 2017-09-10 NOTE — Progress Notes (Signed)
Introduced myself to patient and his wife as the prostate nurse navigator and my role. He has not family history of prostate cancer and was shocked to hear the diagnosis. He is here to discuss his treatment options but leaning towards brachytherapy. I gave them my card and asked them to call me with questions or concerns.

## 2017-09-10 NOTE — Progress Notes (Signed)
Radiation Oncology         [phone] ________________________________  Initial Outpatient Consultation  Name: Dustin Bauer MRN: [id number]  Date: 09/10/2017  DOB: Feb 08, 1945  CC:Chipper Herb, MD  Kathie Rhodes, MD   REFERRING PHYSICIAN: Kathie Rhodes, MD  DIAGNOSIS: 72 y.o. gentleman with Stage T2b adenocarcinoma of the prostate with Gleason Score of 3+4, and PSA of 3.8    ICD-10-CM   1. Malignant neoplasm of prostate [facility]) [place] [facility] referral to Social Work    HISTORY OF PRESENT ILLNESS: Dustin Bauer is a 72 y.o. male with a diagnosis of prostate cancer.  He was noted to have an enlarged and diffusely firm prostate bilaterally (L>R) on examination in May 2019 by his primary care physician, Dr. Redge Gainer.  His PSA at that time was considered normal at 3.8, which has slowly risen over the past several years.  He has been a patient of Dr. Simone Curia since 2014 when he was seen for a left hydrocele.  He has a history of microscopic hematuria evaluated by Dr. Karsten Ro with cystoscopy and upper tract imaging which was negative for any concerning findings in 2017.  This is being followed on an annual basis at this point.  Accordingly, he was referred back for evaluation in urology to Dr. Karsten Ro on 06/23/17, where a second digital rectal examination was performed at that time revealing a smooth prostate, but both lobes were quite firm without specific nodularity.  The patient proceeded to transrectal ultrasound with 12 biopsies of the prostate on 08/10/17.  The prostate volume measured 22.0 cc.  Out of 12 core biopsies, 6 were positive.  The maximum Gleason score was 3+4, and this was seen in the left apex lateral, left apex, and right apex lateral.  Additionally, there was Gleason 3+3 seen in the right apex, right base lateral, and right mid lateral.  Biopsies of prostate revealed:    The patient reviewed the biopsy results with his urologist and he has kindly been referred today for  discussion of potential radiation treatment options. He is accompanied today by his wife. He has recently been seen with Dr. Jonette Eva, [facility], for second opinion as well.  PSA History: 06/09/2017: 3.8 12/04/2015: 3.70 12/20/2013: 3.23 06/25/2013: 3.01 12/16/2012: 3.5 10/28/2011: 2.4  Of note, the patient has cardiac 2 stents placed and is taking Xarelto. He states that his cardiologist was Dr. Aundra Dubin. The patient also has history of pulmonary nodules, followed by Dr. Halford Chessman. Most recent chest CT on 08/18/17 showed stable disease with plans to continue to follow in observation.  PREVIOUS RADIATION THERAPY: No  PAST MEDICAL HISTORY:  Past Medical History:  Diagnosis Date  . Allergy to ACE inhibitors    Cough and fatigue with ACEI  . CAD (coronary artery disease)    a. LHC in 2011 for stable angina >> DES to pLAD [facility])  //  b. inf-post STEMI 7/15 c/b CHB with jxn escape >> LHC: OM1 60-70, pLAD 30-40, m-d RCA occluded, EF 50-55 >> PCI: 3 x 33 mm Xience DES to RCA  //  c. LHC 8/17: dLM 20, pLAD 20, pLAD stent ok, pRI 30, pRCA stent ok with 30% ISR  . Colon polyps   . Complication of anesthesia    no memories from right after last colonscopy/endoscopy may 2018  . GERD (gastroesophageal reflux disease)   . History of echocardiogram    Echo 12/15:  Mild LVH, EF 60-65%, normal wall motion, grade 1 diastolic dysfunction, LA  upper limits of normal, mild RAE  . Hydrocele, left   . Hyperlipidemia    Myalgias with Zocor, Crestor, atorvastatin.   Marland Kitchen Hyperplasia of prostate   . Myocardial infarction ([place]) 2014  . Nicotine abuse   . PAF (paroxysmal atrial fibrillation) [facility])    s/p ablation by Dr Roxan Hockey at [facility] in 2012 // Xarelto anticoagulation  //  ILR removed 2/17 // Event Monitor 11/17: Predominantly NSR (90%). Paroxysmal atrial fibrillation (10%).  Atrial fibrillation appears to be rate-controlled when it occurs.   . Prostate cancer ([place])   . Pyloric stenosis       PAST  SURGICAL HISTORY: Past Surgical History:  Procedure Laterality Date  . ATRIAL FIBRILLATION ABLATION  07/2010   Afib ablation by Dr Roxan Hockey [facility] in 2012  . BALLOON DILATION N/A 07/31/2016   Procedure: BALLOON DILATION;  Surgeon: Manus Gunning, MD;  Location: [facility];  Service: Gastroenterology;  Laterality: N/A;  . BALLOON DILATION N/A 08/27/2016   Procedure: BALLOON DILATION;  Surgeon: Manus Gunning, MD;  Location: [facility];  Service: Gastroenterology;  Laterality: N/A;  . CARDIAC CATHETERIZATION    . CARDIAC CATHETERIZATION N/A 08/17/2015   Procedure: Left Heart Cath and Coronary Angiography;  Surgeon: Larey Dresser, MD;  Location: [facility] CV LAB;  Service: Cardiovascular;  Laterality: N/A;  . colonscopy and endoscopy  05/2016  . CORONARY STENT PLACEMENT     x 2  . EP IMPLANTABLE DEVICE N/A 03/09/2015   Procedure: Loop Recorder Removal;  Surgeon: [name], MD;  Location: [place] CV LAB;  Service: Cardiovascular;  Laterality: N/A;  . ESOPHAGOGASTRODUODENOSCOPY N/A 07/31/2016   Procedure: ESOPHAGOGASTRODUODENOSCOPY (EGD) also have pediatric scope available;  Surgeon: Manus Gunning, MD;  Location: [facility];  Service: Gastroenterology;  Laterality: N/A;  . ESOPHAGOGASTRODUODENOSCOPY N/A 08/27/2016   Procedure: ESOPHAGOGASTRODUODENOSCOPY (EGD);  Surgeon: Manus Gunning, MD;  Location: [facility];  Service: Gastroenterology;  Laterality: N/A;  . implantable loop recorder implantation  08/13/2010   MDT Reveal XT implanted by Dr Roxan Hockey for afib management post ablation  . LEFT HEART CATH N/A 07/15/2013   Procedure: LEFT HEART CATH;  Surgeon: Clent Demark, MD;  Location: [facility] CATH LAB;  Service: Cardiovascular;  Laterality: N/A;  . TONSILLECTOMY AND ADENOIDECTOMY      FAMILY HISTORY:  Family History  Problem Relation Age of Onset  . Dementia Mother   . Colon cancer Mother   . Prostate cancer Mother   . Heart disease  Father   . Hyperlipidemia Father   . Colon cancer Maternal Grandmother   . Pancreatic cancer Sister     SOCIAL HISTORY:  Social History   Socioeconomic History  . Marital status: Married    Spouse name: Not on file  . Number of children: 1  . Years of education: Not on file  . Highest education level: Not on file  Occupational History  . Occupation: retired  Scientific laboratory technician  . Financial resource strain: Not on file  . Food insecurity:    Worry: Not on file    Inability: Not on file  . Transportation needs:    Medical: Not on file    Non-medical: Not on file  Tobacco Use  . Smoking status: Former Smoker    Packs/day: 0.25    Years: 30.00    Pack years: 7.50    Types: Cigarettes    Last attempt to quit: 07/21/2003    Years since quitting: 14.1  . Smokeless tobacco: Never Used  Substance and Sexual Activity  . Alcohol use: Yes    Alcohol/week: 1.0 standard drinks    Types: 1 Glasses of wine per week    Comment: occasional wine  . Drug use: No  . Sexual activity: Yes  Lifestyle  . Physical activity:    Days per week: Not on file    Minutes per session: Not on file  . Stress: Not on file  Relationships  . Social connections:    Talks on phone: Not on file    Gets together: Not on file    Attends religious service: Not on file    Active member of club or organization: Not on file    Attends meetings of clubs or organizations: Not on file    Relationship status: Not on file  . Intimate partner violence:    Fear of current or ex partner: Not on file    Emotionally abused: Not on file    Physically abused: Not on file    Forced sexual activity: Not on file  Other Topics Concern  . Not on file  Social History Narrative   Pt lives in [place] with wife.  Retired from department of transportation.  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  One son.     ALLERGIES: Clopidogrel; Crestor [rosuvastatin calcium]; Lipitor [atorvastatin calcium]; and  Statins  MEDICATIONS:  Current Outpatient Medications  Medication Sig Dispense Refill  . acetaminophen (TYLENOL) 325 MG tablet Take 325 mg by mouth every 6 (six) hours as needed (FOR PAIN.).    Marland Kitchen Alirocumab (PRALUENT) 75 MG/ML SOPN Inject 1 pen into the skin every 14 (fourteen) days. 2 pen 11  . Cholecalciferol (SM VITAMIN D3) 4000 units CAPS Take 4,000 Units by mouth daily.    . Melatonin 3 MG TABS Take 3 mg by mouth at bedtime.    . Naftifine HCl (NAFTIN) 2 % GEL Apply to affected area daily as directed 45 g 2  . Resveratrol 250 MG CAPS Take 250 mg by mouth 3 (three) times a week.     Alveda Reasons 20 MG TABS tablet TAKE 1 TABLET DAILY WITH SUPPER 90 tablet 1  . ketoconazole (NIZORAL) 2 % cream Apply 1 application topically daily as needed for irritation. (Patient not taking: Reported on 09/10/2017) 15 g 1   No current facility-administered medications for this encounter.     REVIEW OF SYSTEMS:  On review of systems, the patient reports that he is doing well overall. He denies any chest pain, shortness of breath, cough, fevers, chills, night sweats, or unintended weight changes. He denies any bowel disturbances, and denies abdominal pain, nausea or vomiting. He reports joint aches and pain related to age; he denies any new musculoskeletal or joint aches or pains. His IPSS was 12, indicating moderate urinary symptoms of intermittency, incomplete emptying, frequency, and nocturia x2. He denies dysuria, hematuria, leakage or incontinence. His SHIM score was 24, indicating he is able to complete sexual activity with most attempts. A complete review of systems is obtained and is otherwise negative.    PHYSICAL EXAM:  Wt Readings from Last 3 Encounters:  09/10/17 167 lb 3.2 oz (75.8 kg)  06/10/17 166 lb (75.3 kg)  06/09/17 167 lb (75.8 kg)   Temp Readings from Last 3 Encounters:  09/10/17 97.9 F (36.6 C) (Oral)  06/10/17 (!) 97.3 F (36.3 C) (Oral)  12/10/16 (!) 97 F (36.1 C) (Oral)   BP  Readings from Last 3 Encounters:  09/10/17 (!) 153/66  06/10/17 122/80  06/09/17 (!) 142/80   Pulse Readings from Last 3 Encounters:  09/10/17 (!) 50  06/10/17 70  06/09/17 (!) 58    /10  In general this is a well appearing caucasian male in no acute distress. He is alert and oriented x4 and appropriate throughout the examination. HEENT reveals that the patient is normocephalic, atraumatic. EOMs are intact. PERRLA. Skin is intact without any evidence of gross lesions. Cardiovascular exam reveals a regular rate and rhythm, no clicks rubs or murmurs are auscultated. Chest is clear to auscultation bilaterally. Lymphatic assessment is performed and does not reveal any adenopathy in the cervical, supraclavicular, axillary, or inguinal chains. Abdomen has active bowel sounds in all quadrants and is intact. The abdomen is soft, non tender, non distended. Lower extremities are negative for pretibial pitting edema, deep calf tenderness, cyanosis or clubbing.   KPS = 100  100 - Normal; no complaints; no evidence of disease. 90   - Able to carry on normal activity; minor signs or symptoms of disease. 80   - Normal activity with effort; some signs or symptoms of disease. 3   - Cares for self; unable to carry on normal activity or to do active work. 60   - Requires occasional assistance, but is able to care for most of his personal needs. 50   - Requires considerable assistance and frequent medical care. 34   - Disabled; requires special care and assistance. 6   - Severely disabled; hospital admission is indicated although death not imminent. 40   - Very sick; hospital admission necessary; active supportive treatment necessary. 10   - Moribund; fatal processes progressing rapidly. 0     - Dead  Karnofsky DA, Abelmann [place], Craver LS and Burchenal [facility] [phone]) The use of the nitrogen mustards in the palliative treatment of carcinoma: with particular reference to bronchogenic carcinoma Cancer 1  [id number]  LABORATORY DATA:  Lab Results  Component Value Date   WBC 7.2 06/09/2017   HGB 15.1 06/09/2017   HCT 45.2 06/09/2017   MCV 95 06/09/2017   PLT 219 06/09/2017   Lab Results  Component Value Date   NA 142 06/09/2017   K 4.7 06/09/2017   CL 106 06/09/2017   CO2 23 06/09/2017   Lab Results  Component Value Date   ALT 20 06/09/2017   AST 22 06/09/2017   ALKPHOS 91 06/09/2017   BILITOT 0.4 06/09/2017     RADIOGRAPHY: Ct Chest Wo Contrast  Result Date: 08/18/2017 CLINICAL DATA:  Followup pulmonary nodule. EXAM: CT CHEST WITHOUT CONTRAST TECHNIQUE: Multidetector CT imaging of the chest was performed following the standard protocol without IV contrast. COMPARISON:  Chest CT 01/30/2017 FINDINGS: Cardiovascular: The heart is normal in size and stable. No pericardial effusion. Stable mild tortuosity, ectasia and calcification of the thoracic aorta. Stable dense three-vessel coronary artery calcifications. Mediastinum/Nodes: Stable scattered borderline mediastinal lymph nodes. 9 mm pretracheal lymph node on image number 46 is stable. Small cluster of pretracheal nodes are stable. 5 mm prevascular lymph nodes are stable. The esophagus is grossly normal. Lungs/Pleura: The 6 mm nodular density seen on the prior study in the right upper lobe has resolved and was likely a focal area of inflammation. 5.5 mm right middle lobe nodule appears stable. No other pulmonary lesions are identified. No acute pulmonary findings. Stable emphysematous changes and areas of mild scarring. Stable small venous varix involving a left lower lobe pulmonary vein. No pleural effusion or pleural nodules. Upper Abdomen: No significant  upper abdominal findings. Musculoskeletal: No chest wall mass, supraclavicular or axillary adenopathy. No significant bony findings. IMPRESSION: 1. Interval resolution of the 6 mm right upper lobe pulmonary nodule. 2. Stable 5.5 mm right middle lobe nodule.  No change since 2017. 3. No new  pulmonary nodules or acute overlying pulmonary process. 4. Stable small mediastinal lymph nodes. 5. Stable advanced three-vessel coronary artery calcifications. 6. Recommend followup noncontrast chest CT in 12 months. Aortic Atherosclerosis (ICD10-I70.0) and Emphysema (ICD10-J43.9). Electronically Signed   By: Marijo Sanes M.D.   On: 08/18/2017 17:20      IMPRESSION/PLAN: 1. 72 y.o. gentleman with Stage T2b adenocarcinoma of the prostate with Gleason Score of 3+4, and PSA of 3.8. We discussed the patient's workup and outlined the nature of prostate cancer in this setting. The patient's T stage, Gleason's score, and PSA put him into the favorable intermediate risk group. Accordingly, he is eligible for a variety of potential treatment options including brachytherapy, 5.5 weeks of external radiation, or prostatectomy. We discussed the available radiation techniques, and focused on the details and logistics and delivery. We discussed and outlined the risks, benefits, short and long-term effects associated with radiotherapy and compared and contrasted these with prostatectomy. We also discussed the role of SpaceOAR in reducing the rectal toxicity associated with radiotherapy. We also detailed the role of ADT in the treatment of unfavorable risk and his risk prostate cancer and outlined the associated side effects that could be expected with this therapy. We discussed that while this is certainly an option in combination with radiotherapy, studies suggest that there is no significant impact on disease free progression or overall survival in patient's with favorable intermediate risk prostate cancer.  In his case, we feel that the overall imapact of side effects on his quality of life would outweigh benefit.  At the conclusion of our conversation, the patient is interested in moving forward with brachytherapy and use of SpaceOAR to reduce rectal toxicity from radiotherapy.  We will share our discussion with Dr.  Karsten Ro and move forward with scheduling his CT [facility] planning appointment in the near future.  We will need to obtain cardiac clearance and permission to hold Xarelto from the patient's cardiologist prior to seed implant procedure.  The patient will be contacted by Romie Jumper in our office who will be working closely with him to coordinate OR scheduling and pre and post procedure appointments.  We will contact the pharmaceutical rep to ensure that [place] is available at the time of procedure.  He will have a prostate MRI following his post-seed CT SIM to confirm appropriate distribution of the [place].  We spent 60 minutes face to face with the patient and more than 50% of that time was spent in counseling and/or coordination of care.    Nicholos Johns, PA-C    Tyler Pita, MD  [facility] Direct Dial: [phone]  Fax: [phone] [email or web address]  Skype  [email or web address]  This document serves as a record of services personally performed by Tyler Pita, MD and Freeman Caldron, PA-C. It was created on their behalf by Rae Lips, a trained medical scribe. The creation of this record is based on the scribe's personal observations and the providers' statements to them. This document has been checked and approved by the attending providers.

## 2017-09-10 NOTE — Progress Notes (Signed)
See progress note under physician encounter. 

## 2017-09-16 ENCOUNTER — Encounter: Payer: Self-pay | Admitting: General Practice

## 2017-09-16 NOTE — Progress Notes (Signed)
[place] Psychosocial Distress Screening Clinical Social Work  Clinical Social Work was referred by distress screening protocol.  The patient scored a 5 on the Psychosocial Distress Thermometer which indicates moderate distress. Clinical Social Worker contacted patient by phone to assess for distress and other psychosocial needs. Patient concerned about scheduling appointments, wants to plan activities but would like to know treatment schedule.  Retired, lives w wife who can transport and provide care at home if needed.  Would like call from nurse navigator to address some specific questions re upcoming procedures and scheduling questions, staff message sent.    ON[facility]N DISTRESS SCREENING 09/10/2017  Screening Type Initial Screening  Distress experienced in past week (1-10) 5  Practical problem type Transportation  Emotional problem type Adjusting to illness  Spiritual/Religous concerns type Facing my mortality  Information Concerns Type Lack of info about diagnosis;Lack of info about treatment;Lack of info about complementary therapy choices;Lack of info about maintaining fitness  Physician notified of physical symptoms Yes  Referral to clinical psychology No  Referral to clinical social work No  Referral to dietition No  Referral to financial advocate No  Referral to support programs No  Referral to palliative care No    Clinical Social Worker follow up needed: No.  If yes, follow up plan:  Beverely Pace, [place], LCSW Clinical Social Worker Phone:  [phone]

## 2017-09-17 ENCOUNTER — Telehealth: Payer: Self-pay | Admitting: Cardiovascular Disease

## 2017-09-17 ENCOUNTER — Telehealth: Payer: Self-pay | Admitting: *Deleted

## 2017-09-17 NOTE — Telephone Encounter (Signed)
Patient with diagnosis of ATRIAL FIBRILLATION on [place] for anticoagulation.    Procedure: PROSTATE IMPLANTS Date of procedure: TBD  CHADS2VASc score of 3 (age, HTN, CAD)   CrCl = 70ML/MIN   Per office protocol, patient can hold XARELTO for 2 days prior to procedure.    Please restart XARELTO on the evening of procedure or day after, at discretion of procedure MD   Dustin Bauer PharmD, BCPS, [place] [address] [place] [address] 09/17/2017 5:06 PM

## 2017-09-17 NOTE — Telephone Encounter (Signed)
   Primary Cardiologist: Mertie Moores, MD  Chart reviewed as part of pre-operative protocol coverage. Patient was contacted 09/17/2017 in reference to pre-operative risk assessment for pending surgery as outlined below.  Dustin Bauer was last seen on 06/09/17  by Fabian Sharp, PA.  Since that day, Dustin Bauer has done well. He walks 1 hour every day. Does to gyms 3 times/week and does weight lifting without any limitation.   Therefore, based on ACC/AHA guidelines, the patient would be at acceptable risk for the planned procedure without further cardiovascular testing.   Will ask pharmacy to review anticoagulation.   [place], [place] 09/17/2017, 4:25 PM

## 2017-09-17 NOTE — Telephone Encounter (Signed)
New Message:        [facility] Pre-operative Risk Assessment    Request for surgical clearance:  1. What type of surgery is being performed? Prostate Implant  2. When is this surgery scheduled? TBD  3. What type of clearance is required (medical clearance vs. Pharmacy clearance to hold med vs. Both)? Both   4. Are there any medications that need to be held prior to surgery and how long? 1 Week  5. Practice name and name of physician performing surgery? Dr. Tammi Klippel   6. What is your office phone number [phone]   7.   What is your office fax number [phone] Attn: Enid Derry   8.   Anesthesia type (None, local, MAC, general) ? General    Charmain M Stokes 09/17/2017, 10:26 AM  _________________________________________________________________   (provider comments below)

## 2017-09-17 NOTE — Telephone Encounter (Signed)
Called patient to update, and to let him know that I am waiting on cardiac clearance, patient verified understanding this.

## 2017-09-18 ENCOUNTER — Telehealth: Payer: Self-pay | Admitting: *Deleted

## 2017-09-18 NOTE — Telephone Encounter (Signed)
Called patient to inform of pre-seed planning CT for 10-4-1, lvm for a return call

## 2017-09-21 ENCOUNTER — Telehealth: Payer: Self-pay | Admitting: Radiation Oncology

## 2017-09-21 NOTE — Telephone Encounter (Signed)
Mr. Farabee called back and left him Shirley's msg about cardiac clearance was ok'd and also gave him 10/4 date of seeing Dr. Tammi Klippel and having a CT SIM.

## 2017-10-08 ENCOUNTER — Telehealth: Payer: Self-pay

## 2017-10-08 NOTE — Telephone Encounter (Signed)
   [facility] Pre-operative Risk Assessment    Request for surgical clearance:  1. What type of surgery is being performed? Implant  2. When is this surgery scheduled? TBD   3. What type of clearance is required (medical clearance vs. Pharmacy clearance to hold med vs. Both)? Both  4. Are there any medications that need to be held prior to surgery and how long? Xarelto   5. Practice name and name of physician performing surgery? Radiation Oncology Department- Dr. Tammi Klippel   6. What is your office phone number [place]   7.   What is your office fax number  [phone]  8.   Anesthesia type (None, local, MAC,  general) ?Unknown   Michaelyn Barter 10/08/2017, 4:43 PM  _________________________________________________________________   (provider comments below)

## 2017-10-09 NOTE — Telephone Encounter (Signed)
?   Seed implant please clarify

## 2017-10-09 NOTE — Telephone Encounter (Signed)
Duplicate fax. Will close encounter

## 2017-10-15 ENCOUNTER — Telehealth: Payer: Self-pay | Admitting: *Deleted

## 2017-10-15 NOTE — Telephone Encounter (Signed)
CALLED PATIENT TO REMIND OF PRE-SEED PLANNING CT FOR 10-16-17, SPOKE WITH PATIENT AND HE IS AWARE OF THESE APPTS.

## 2017-10-16 ENCOUNTER — Ambulatory Visit
Admission: RE | Admit: 2017-10-16 | Discharge: 2017-10-16 | Disposition: A | Discharge status: Home / Self Care | Payer: Medicare Other | Source: Ambulatory Visit | Attending: Radiation Oncology | Admitting: Radiation Oncology

## 2017-10-16 ENCOUNTER — Other Ambulatory Visit: Payer: Self-pay | Admitting: Urology

## 2017-10-16 ENCOUNTER — Encounter: Payer: Self-pay | Admitting: Medical Oncology

## 2017-10-16 DIAGNOSIS — C61 Malignant neoplasm of prostate: Secondary | ICD-10-CM

## 2017-10-16 NOTE — Progress Notes (Signed)
  Radiation Oncology         [phone] ________________________________  Name: Dustin Bauer MRN: [id number]  Date: 10/16/2017  DOB: 1945/12/19  SIMULATION AND TREATMENT PLANNING NOTE PUBIC ARCH STUDY  [id number], Estella Husk, MD  Kathie Rhodes, MD  DIAGNOSIS: 72 y.o. gentleman with Stage T2b adenocarcinoma of the prostate with Gleason Score of 3+4, and PSA of 3.8     ICD-10-CM   1. Malignant neoplasm of prostate ([place]) C61     COMPLEX SIMULATION:  The patient presented today for evaluation for possible prostate seed implant. He was brought to the radiation planning suite and placed supine on the CT couch. A 3-dimensional image study set was obtained in upload to the planning computer. There, on each axial slice, I contoured the prostate gland. Then, using three-dimensional radiation planning tools I reconstructed the prostate in view of the structures from the transperineal needle pathway to assess for possible pubic arch interference. In doing so, I did not appreciate any pubic arch interference. Also, the patient's prostate volume was estimated based on the drawn structure. The volume was 26 cc.  TRUS vol was 22 cc.  Given the pubic arch appearance and prostate volume, patient remains a good candidate to proceed with prostate seed implant. Today, he freely provided informed written consent to proceed.    PLAN: The patient will undergo prostate seed implant.   ________________________________  Sheral Apley. Tammi Klippel, M.D.

## 2017-10-19 ENCOUNTER — Telehealth: Payer: Self-pay | Admitting: *Deleted

## 2017-10-19 NOTE — Telephone Encounter (Signed)
Called patient to inform of implant and space oar date, lvm for a return call

## 2017-10-20 ENCOUNTER — Telehealth: Payer: Self-pay | Admitting: *Deleted

## 2017-10-20 NOTE — Telephone Encounter (Signed)
Called patient to inform of appt. for chest x-r ay and EKG for 10-23-17 - arrival time - 1:45 pm @ [facility] Admitting and his blood work on 11-20-17 - arrival time - 1:45 pm @ [employer] [facility], spoke with patient and he is aware of these appts.

## 2017-10-23 ENCOUNTER — Ambulatory Visit (HOSPITAL_COMMUNITY)
Admission: RE | Admit: 2017-10-23 | Discharge: 2017-10-23 | Disposition: A | Discharge status: Home / Self Care | Payer: Medicare Other | Source: Ambulatory Visit | Attending: Urology | Admitting: Urology

## 2017-10-23 ENCOUNTER — Encounter (HOSPITAL_COMMUNITY)
Admission: RE | Admit: 2017-10-23 | Discharge: 2017-10-23 | Disposition: A | Discharge status: Home / Self Care | Payer: Medicare Other | Source: Ambulatory Visit | Attending: Urology | Admitting: Urology

## 2017-10-23 ENCOUNTER — Other Ambulatory Visit: Payer: Self-pay

## 2017-10-23 DIAGNOSIS — C61 Malignant neoplasm of prostate: Secondary | ICD-10-CM | POA: Diagnosis not present

## 2017-10-23 DIAGNOSIS — Z01818 Encounter for other preprocedural examination: Secondary | ICD-10-CM | POA: Insufficient documentation

## 2017-11-03 ENCOUNTER — Other Ambulatory Visit: Payer: Self-pay | Admitting: Urology

## 2017-11-03 DIAGNOSIS — C61 Malignant neoplasm of prostate: Secondary | ICD-10-CM

## 2017-11-19 ENCOUNTER — Telehealth: Payer: Self-pay | Admitting: *Deleted

## 2017-11-19 NOTE — Telephone Encounter (Signed)
Called patient to remind of lab appt. for 11-20-17 - arrival time- 1:45 pm @ [facility] Admitting, spoke with patient and he is aware of this appt.

## 2017-11-20 ENCOUNTER — Encounter (HOSPITAL_COMMUNITY)
Admission: RE | Admit: 2017-11-20 | Discharge: 2017-11-20 | Disposition: A | Discharge status: Home / Self Care | Payer: Medicare Other | Source: Ambulatory Visit | Attending: Urology | Admitting: Urology

## 2017-11-20 DIAGNOSIS — Z01812 Encounter for preprocedural laboratory examination: Secondary | ICD-10-CM | POA: Diagnosis present

## 2017-11-20 LAB — COMPREHENSIVE METABOLIC PANEL
ALT: 17 U/L (ref 0–44)
AST: 23 U/L (ref 15–41)
Albumin: 3.8 g/dL (ref 3.5–5.0)
Alkaline Phosphatase: 72 U/L (ref 38–126)
Anion gap: 7 (ref 5–15)
BUN: 19 mg/dL (ref 8–23)
CO2: 28 mmol/L (ref 22–32)
Calcium: 9.3 mg/dL (ref 8.9–10.3)
Chloride: 106 mmol/L (ref 98–111)
Creatinine, Ser: 1 mg/dL (ref 0.61–1.24)
GFR calc Af Amer: >60 mL/min (ref >60)
GFR calc non Af Amer: >60 mL/min (ref >60)
Glucose, Bld: 108 mg/dL — ABNORMAL HIGH (ref 70–99)
Potassium: 4.6 mmol/L (ref 3.5–5.1)
Sodium: 141 mmol/L (ref 135–145)
Total Bilirubin: 0.7 mg/dL (ref 0.3–1.2)
Total Protein: 6.8 g/dL (ref 6.5–8.1)

## 2017-11-20 LAB — CBC
HCT: 45.2 % (ref 39.0–52.0)
Hemoglobin: 14.3 g/dL (ref 13.0–17.0)
MCH: 31.3 pg (ref 26.0–34.0)
MCHC: 31.6 g/dL (ref 30.0–36.0)
MCV: 98.9 fL (ref 80.0–100.0)
Platelets: 201 10*3/uL (ref 150–400)
RBC: 4.57 MIL/uL (ref 4.22–5.81)
RDW: 14.9 % (ref 11.5–15.5)
WBC: 7.2 10*3/uL (ref 4.0–10.5)
nRBC: 0 % (ref 0.0–0.2)

## 2017-11-20 LAB — PROTIME-INR
INR: 1.14
Prothrombin Time: 14.5 seconds (ref 11.4–15.2)

## 2017-11-20 LAB — APTT: aPTT: 47 seconds — ABNORMAL HIGH (ref 24–36)

## 2017-11-23 ENCOUNTER — Encounter (HOSPITAL_BASED_OUTPATIENT_CLINIC_OR_DEPARTMENT_OTHER): Payer: Self-pay

## 2017-11-23 NOTE — Pre-Procedure Instructions (Signed)
PTT results 11/20/17 sent to Dr. Karsten Ro via epic.

## 2017-11-24 ENCOUNTER — Other Ambulatory Visit: Payer: Self-pay

## 2017-11-24 ENCOUNTER — Encounter (HOSPITAL_BASED_OUTPATIENT_CLINIC_OR_DEPARTMENT_OTHER): Payer: Self-pay | Admitting: *Deleted

## 2017-11-24 NOTE — Progress Notes (Signed)
Spoke with Dustin Bauer after midnight, arrive 715 am 11-27-17 [facility] meds to take: fleets enema am of surgery Cardiology clearance bhagat bhavinkumar pa 09-17-17 on chart Records on chart/epic: chest xray 10-23-17, ekg 10-23-17, exercise tolerance test 10-31-15, pt, ptt, cbc, cmet 11-20-17, lov dr Trula Slade vascular 02-23-17, lov [facility]ology 06-09-17 Driver wife Dustin Bauer will stay Has surgery orders in epic

## 2017-11-26 ENCOUNTER — Telehealth: Payer: Self-pay | Admitting: *Deleted

## 2017-11-26 NOTE — Anesthesia Preprocedure Evaluation (Addendum)
Anesthesia Evaluation  Patient identified by MRN, date of birth, ID band Patient awake    Reviewed: Allergy & Precautions, NPO status , Patient's Chart, lab work & pertinent test results  History of Anesthesia Complications Negative for: history of anesthetic complications  Airway Mallampati: I  TM Distance: >3 FB Neck ROM: Full    Dental no notable dental hx. (+) Teeth Intact, Dental Advisory Given   Pulmonary COPD, former smoker,    Pulmonary exam normal breath sounds clear to auscultation       Cardiovascular + CAD, + Past MI (2014) and + Cardiac Stents (2011 and 2014)  Normal cardiovascular exam+ dysrhythmias Atrial Fibrillation  Rhythm:Regular Rate:Normal  8/17 catheterization: normal EF, nonobstructive CAD   Neuro/Psych negative neurological ROS     GI/Hepatic negative GI ROS, Neg liver ROS,   Endo/Other  negative endocrine ROS  Renal/GU negative Renal ROS     Musculoskeletal negative musculoskeletal ROS (+)   Abdominal   Peds  Hematology negative hematology ROS (+)   Anesthesia Other Findings Day of surgery medications reviewed with the patient.  Reproductive/Obstetrics                            Anesthesia Physical Anesthesia Plan  ASA: III  Anesthesia Plan: General   Post-op Pain Management:    Induction:   PONV Risk Score and Plan: 2 and Treatment may vary due to age or medical condition, Ondansetron and Dexamethasone  Airway Management Planned: LMA  Additional Equipment:   Intra-op Plan:   Post-operative Plan: Extubation in OR  Informed Consent: I have reviewed the patients History and Physical, chart, labs and discussed the procedure including the risks, benefits and alternatives for the proposed anesthesia with the patient or authorized representative who has indicated his/her understanding and acceptance.   Dental advisory given  Plan Discussed with:  CRNA  Anesthesia Plan Comments:        Anesthesia Quick Evaluation

## 2017-11-26 NOTE — Telephone Encounter (Signed)
Called patient to remind of procedure for 11-27-17, spoke with patient and he is aware of this procedure

## 2017-11-26 NOTE — H&P (Signed)
HPI: Dustin Bauer is a 72 year-old male presents today for I 125 seed implant for prostate cancer.  His prostate cancer was diagnosed 08/10/2017. His PSA at his time of diagnosis was 3.8. His cancer was T2b, Gleason 3+3 in 3 cores & 3+4 in 3 cores.   Adenocarcinoma of the prostate: He was found to have a diffusely firm prostate with a PSA of 3.8.  TRUS/BX 08/10/17: Normal-appearing prostate, 22 cc.  Pathology: Adenocarcinoma Gleason 3+3 = 6 in 3 cores and 3+4 = 7 in 3 cores.  Stage: T2b     ALLERGIES: Statins     MEDICATIONS: Levaquin 750 mg tablet Take the morning of your prostate biopsy.  Nitrostat 0.4 MG Sublingual Tablet Sublingual Sublingual  Praluent Pen  Xarelto     GU PSH: Cystoscopy - 2018 Locm 300-399Mg /Ml Iodine,1Ml - 2018 Prostate Needle Biopsy - 08/10/2017      PSH Notes: Cath Placement Of Stent 1, Catheter Ablation Atrial Fibrillation   NON-GU PSH: Surgical Pathology, Gross And Microscopic Examination For Prostate Needle - 08/10/2017    GU PMH: Prostate nodule w/o LUTS, Although his PSA is normal we did discuss the fact that neuroendocrine tumors and highly de differentiated prostate cancer can present with normal PSAs. He has a very abnormal DRE without specific nodularity and this may just be a very fibrous prostate gland but I have recommended further evaluation with a prostate biopsy. He takes Xarelto and has stop this previously so will stop the medication 48 hr prior to his biopsy. - 06/23/2017 Microscopic hematuria (Stable), I have discussed with the patient the fact that the CT scan has revealed no abnormality of the upper tract that could contribute to the presence of microscopic hematuria and cystoscopically no abnormality of the lower tract was identified. In this case current recommendations are that the patient be followed for microscopic hematuria for 3 - 5 years with urinalysis and serum creatinine on a yearly basis. If the urine remains positive for microscopic  hematuria and the patient develops new hypertension, proteinuria or blood cell casts on urinalysis evaluation for primary renal disease should be undertaken. If, on the other hand, microscopic hematuria persists in the absence of any findings to suggest primary renal disease, then repeat urologic evaluation should be considered. In addition if the patient has 2 consecutive negative urinalyses yearly over a two-year period then no further urinalyses for the purpose of evaluation of asymptomatic microscopic hematuria are necessary. - 2018, I have discussed with the patient that the evaluation of asymptomatic microscopic hematuria consists of excluding benign causes including menstruation in women as well as, in both sexes, vigorous exercise, sexual activity, viral illnesses, trauma and infection. If these factors are negative and the urinalysis reveals significant proteinuria, dysmorphic red blood cells or red cell casts and/or an elevated serum creatinine then evaluation by a nephrologist is indicated. If conditions suggestive of primary renal disease are not present and the patient has a low risk of urothelial malignancy consisting of age less than 40 years, no smoking history, no history of chemical exposure, no irritative voiding symptoms, no history of gross hematuria and no history of urologic disorder or disease then upper tract imaging should be undertaken with a CT scan and consideration of cystoscopy versus urine cytology as well. If, on the other hand the patient is at high risk then a complete evaluation should be undertaken including upper tract imaging with CT scan and cystoscopy and consideration of urine cytology as well., - 2018 Elevated PSA (Stable),  He has a history of an elevated PSA in the past. I do note that his PSA was checked in 11/17 and was found to be 3.7 - 2018, Rising PSA level, - 2015, Rising PSA level, - 2015 BPH w/o LUTS, Benign enlargement of prostate - 2015 Hydrocele, Unspec  (Chronic), Left, Hydrocele, left - 2014      PMH Notes: Left hydrocele: He was seen in 7/14 for a slight swelling in the left hemiscrotum that has been present for at least 2 or 3 years and possibly longer. It transilluminated consistent with a hydrocele and we discussed treatment options.   Rising PSA: PSA on 12/16/12 was 3.5/11.4%. His PSA in 10/13 was 2.4.   Microscopic hematuria: This was noted in 11/17. He was seen and evaluated in 1/18 with CT scan and cystoscopy which were both found to be negative.      NON-GU PMH: Encounter for general adult medical examination without abnormal findings, Encounter for preventive health examination - 2014 Personal history of other diseases of the circulatory system, History of cardiac disorder - 2014 Unspecified atrial fibrillation, Atrial Fibrillation - 2014    FAMILY HISTORY: cardiac disorder - Runs In Family Dementia - Runs In Family Heart Disease - Runs In Family   SOCIAL HISTORY: Marital Status: Married Preferred Language: English; Ethnicity: Not Hispanic Or Latino; Race: White Current Smoking Status: Patient does not smoke anymore.   Tobacco Use Assessment Completed: Used Tobacco in last 30 days? Light Drinker.  Drinks 3 caffeinated drinks per day.     Notes: Former smoker, Caffeine Use, Alcohol Use, Marital History - Currently Married   REVIEW OF SYSTEMS:    GU Review Male:   Patient denies frequent urination, hard to postpone urination, burning/ pain with urination, get up at night to urinate, leakage of urine, stream starts and stops, trouble starting your stream, have to strain to urinate , erection problems, and penile pain.  Gastrointestinal (Upper):   Patient denies nausea, vomiting, and indigestion/ heartburn.  Gastrointestinal (Lower):   Patient denies diarrhea and constipation.  Constitutional:   Patient denies fever, night sweats, weight loss, and fatigue.  Skin:   Patient denies skin rash/ lesion and itching.  Eyes:    Patient denies blurred vision and double vision.  Ears/ Nose/ Throat:   Patient denies sore throat and sinus problems.  Hematologic/Lymphatic:   Patient denies swollen glands and easy bruising.  Cardiovascular:   Patient denies leg swelling and chest pains.  Respiratory:   Patient denies cough and shortness of breath.  Endocrine:   Patient denies excessive thirst.  Musculoskeletal:   Patient denies joint pain and back pain.  Neurological:   Patient denies headaches and dizziness.  Psychologic:   Patient denies depression and anxiety.   VITAL SIGNS:    Weight 165 lb / 74.84 kg  Height 70 in / 177.8 cm  BP 158/92 mmHg  Pulse 48 /min  BMI 23.7 kg/m   MULTI-SYSTEM PHYSICAL EXAMINATION:    Constitutional: Well-nourished. No physical deformities. Normally developed. Good grooming.  Neck: Neck symmetrical, not swollen. Normal tracheal position.  Respiratory: No labored breathing, no use of accessory muscles.   Cardiovascular: Normal temperature, normal extremity pulses, no swelling, no varicosities.  Lymphatic: No enlargement of neck, axillae, groin.  Skin: No paleness, no jaundice, no cyanosis. No lesion, no ulcer, no rash.  Neurologic / Psychiatric: Oriented to time, oriented to place, oriented to person. No depression, no anxiety, no agitation.  Gastrointestinal: No mass, no tenderness, no rigidity, non  obese abdomen.  Urethral Meatus: Normal size. No lesion, no wart, no discharge, no polyp. Normal location.  Penis: Circumcised, no warts, no cracks. No dorsal Peyronie's plaques, no left corporal Peyronie's plaques, no right corporal Peyronie's plaques, no scarring, no warts. No balanitis, no meatal stenosis.  Rectal: Rectal exam demonstrates normal sphincter tone, no tenderness and no masses. Estimated prostate size is 1+. His prostate is asymmetric with the left lobe larger than the right. The prostate has no nodularity and is not tender. The left seminal vesicle is nonpalpable. The right  seminal vesicle is nonpalpable. The perineum is normal on inspection. Eyes: Normal conjunctivae. Normal eyelids.  Ears, Nose, Mouth, and Throat: Left ear no scars, no lesions, no masses. Right ear no scars, no lesions, no masses. Nose no scars, no lesions, no masses. Normal hearing. Normal lips.  Musculoskeletal: Normal gait and station of head and neck.    PAST DATA REVIEWED:  Source Of History:  Patient  Lab Test Review:   Path Report  Records Review:   Previous Patient Records   06/09/17 12/04/15 12/20/13 06/25/13 12/16/12 10/28/11  PSA  Total PSA 3.8 ng/dl 3.70 ng/dl 3.23  3.01  3.5 ng/dl 2.4 ng/dl    PROCEDURES:          Urinalysis w/Scope Dipstick Dipstick Cont'd Micro  Color: Yellow Bilirubin: Neg mg/dL WBC/hpf: 0 - 5/hpf  Appearance: Clear Ketones: Neg mg/dL RBC/hpf: 0 - 2/hpf  Specific Gravity: 1.030 Blood: Neg ery/uL Bacteria: Rare (0-9/hpf)  pH: <=5.0 Protein: 1+ mg/dL Cystals: NS (Not Seen)  Glucose: Neg mg/dL Urobilinogen: 1.0 mg/dL Casts: Granular    Nitrites: Neg Trichomonas: Not Present    Leukocyte Esterase: Neg leu/uL Mucous: Present      Epithelial Cells: NS (Not Seen)      Yeast: NS (Not Seen)      Sperm: Not Present    ASSESSMENT/PLAN:      ICD-10 Details  1 GU:   Prostate Cancer I went over his pathology report with him today as well as the significance of his Gleason score, number and location of cores positive and percent of cores positive. We then discussed his Partin table results in detail and the significance of these predictions as far as prognosis and need for further workup are concerned. I then discussed with him the various options available including active surveillance and treatment for cure such as radiation and surgery. We discussed the forms of radiation available both external beam and radioactive seeds. He was interested in radioactive seeds and therefore we discussed this in further detail. I went over how these were implanted, the potential  risks and complications, the benefit achieved from the use of spaceOAR and the probability of success.   He has elected to proceed with radioactive seed implant. I told him I felt he had an excellent chance of cure.

## 2017-11-27 ENCOUNTER — Ambulatory Visit (HOSPITAL_COMMUNITY): Payer: Medicare Other

## 2017-11-27 ENCOUNTER — Encounter (HOSPITAL_BASED_OUTPATIENT_CLINIC_OR_DEPARTMENT_OTHER)
Admission: RE | Disposition: A | Discharge status: Home / Self Care | Payer: Self-pay | Source: Ambulatory Visit | Attending: Urology

## 2017-11-27 ENCOUNTER — Ambulatory Visit (HOSPITAL_BASED_OUTPATIENT_CLINIC_OR_DEPARTMENT_OTHER): Payer: Medicare Other | Admitting: Anesthesiology

## 2017-11-27 ENCOUNTER — Encounter (HOSPITAL_BASED_OUTPATIENT_CLINIC_OR_DEPARTMENT_OTHER): Payer: Self-pay

## 2017-11-27 ENCOUNTER — Ambulatory Visit (HOSPITAL_BASED_OUTPATIENT_CLINIC_OR_DEPARTMENT_OTHER)
Admission: RE | Admit: 2017-11-27 | Discharge: 2017-11-27 | Disposition: A | Discharge status: Home / Self Care | Payer: Medicare Other | Source: Ambulatory Visit | Attending: Urology | Admitting: Urology

## 2017-11-27 DIAGNOSIS — I2510 Atherosclerotic heart disease of native coronary artery without angina pectoris: Secondary | ICD-10-CM | POA: Diagnosis not present

## 2017-11-27 DIAGNOSIS — I252 Old myocardial infarction: Secondary | ICD-10-CM | POA: Insufficient documentation

## 2017-11-27 DIAGNOSIS — Z87891 Personal history of nicotine dependence: Secondary | ICD-10-CM | POA: Diagnosis not present

## 2017-11-27 DIAGNOSIS — C61 Malignant neoplasm of prostate: Secondary | ICD-10-CM

## 2017-11-27 DIAGNOSIS — J449 Chronic obstructive pulmonary disease, unspecified: Secondary | ICD-10-CM | POA: Diagnosis not present

## 2017-11-27 DIAGNOSIS — Z955 Presence of coronary angioplasty implant and graft: Secondary | ICD-10-CM | POA: Insufficient documentation

## 2017-11-27 DIAGNOSIS — I4891 Unspecified atrial fibrillation: Secondary | ICD-10-CM | POA: Diagnosis not present

## 2017-11-27 DIAGNOSIS — Z79899 Other long term (current) drug therapy: Secondary | ICD-10-CM | POA: Insufficient documentation

## 2017-11-27 DIAGNOSIS — Z7901 Long term (current) use of anticoagulants: Secondary | ICD-10-CM | POA: Diagnosis not present

## 2017-11-27 HISTORY — DX: Emphysema, unspecified: J43.9

## 2017-11-27 HISTORY — PX: RADIOACTIVE SEED IMPLANT: SHX5150

## 2017-11-27 HISTORY — PX: SPACE OAR INSTILLATION: SHX6769

## 2017-11-27 HISTORY — DX: Abdominal aortic aneurysm, without rupture: I71.4

## 2017-11-27 HISTORY — DX: Hydrocele, unspecified: N43.3

## 2017-11-27 HISTORY — DX: Diverticulosis of intestine, part unspecified, without perforation or abscess without bleeding: K57.90

## 2017-11-27 HISTORY — DX: Solitary pulmonary nodule: R91.1

## 2017-11-27 HISTORY — DX: Cardiac arrhythmia, unspecified: I49.9

## 2017-11-27 HISTORY — DX: Atherosclerosis of aorta: I70.0

## 2017-11-27 HISTORY — DX: Other diseases of stomach and duodenum: K31.89

## 2017-11-27 HISTORY — DX: Thoracic aortic aneurysm, without rupture: I71.2

## 2017-11-27 HISTORY — DX: Atrioventricular block, first degree: I44.0

## 2017-11-27 SURGERY — INSERTION, RADIATION SOURCE, PROSTATE
Anesthesia: General | Site: Prostate

## 2017-11-27 MED ORDER — FLEET ENEMA 7-19 GM/118ML RE ENEM
1.0000 | ENEMA | Freq: Once | RECTAL | Status: AC
  Administered 2017-11-27: 1 via RECTAL
  Filled 2017-11-27: qty 1

## 2017-11-27 MED ORDER — IOHEXOL 300 MG/ML  SOLN
INTRAMUSCULAR | Status: DC | PRN
  Administered 2017-11-27: 7 mL via INTRAVENOUS

## 2017-11-27 MED ORDER — ACETAMINOPHEN 10 MG/ML IV SOLN
1000.0000 mg | Freq: Once | INTRAVENOUS | Status: DC | PRN
  Filled 2017-11-27: qty 100

## 2017-11-27 MED ORDER — FENTANYL CITRATE (PF) 100 MCG/2ML IJ SOLN
INTRAMUSCULAR | Status: AC
  Filled 2017-11-27: qty 2

## 2017-11-27 MED ORDER — OXYCODONE HCL 5 MG PO TABS
5.0000 mg | ORAL_TABLET | Freq: Once | ORAL | Status: DC | PRN
  Filled 2017-11-27: qty 1

## 2017-11-27 MED ORDER — EPHEDRINE 5 MG/ML INJ
INTRAVENOUS | Status: AC
  Filled 2017-11-27: qty 10

## 2017-11-27 MED ORDER — OXYCODONE HCL 5 MG/5ML PO SOLN
5.0000 mg | Freq: Once | ORAL | Status: DC | PRN
  Filled 2017-11-27: qty 5

## 2017-11-27 MED ORDER — DEXAMETHASONE SODIUM PHOSPHATE 10 MG/ML IJ SOLN
INTRAMUSCULAR | Status: AC
  Filled 2017-11-27: qty 1

## 2017-11-27 MED ORDER — LIDOCAINE 2% (20 MG/ML) 5 ML SYRINGE
INTRAMUSCULAR | Status: DC | PRN
  Administered 2017-11-27: 60 mg via INTRAVENOUS

## 2017-11-27 MED ORDER — ONDANSETRON HCL 4 MG/2ML IJ SOLN
INTRAMUSCULAR | Status: AC
  Filled 2017-11-27: qty 2

## 2017-11-27 MED ORDER — LIDOCAINE 2% (20 MG/ML) 5 ML SYRINGE
INTRAMUSCULAR | Status: AC
  Filled 2017-11-27: qty 5

## 2017-11-27 MED ORDER — CIPROFLOXACIN IN D5W 400 MG/200ML IV SOLN
400.0000 mg | INTRAVENOUS | Status: AC
  Administered 2017-11-27: 400 mg via INTRAVENOUS
  Filled 2017-11-27: qty 200

## 2017-11-27 MED ORDER — PROMETHAZINE HCL 25 MG/ML IJ SOLN
6.2500 mg | INTRAMUSCULAR | Status: DC | PRN
  Filled 2017-11-27: qty 1

## 2017-11-27 MED ORDER — PROPOFOL 10 MG/ML IV BOLUS
INTRAVENOUS | Status: AC
  Filled 2017-11-27: qty 20

## 2017-11-27 MED ORDER — DEXAMETHASONE SODIUM PHOSPHATE 10 MG/ML IJ SOLN
INTRAMUSCULAR | Status: DC | PRN
  Administered 2017-11-27 (×2): 10 mg via INTRAVENOUS

## 2017-11-27 MED ORDER — EPHEDRINE SULFATE-NACL 50-0.9 MG/10ML-% IV SOSY
PREFILLED_SYRINGE | INTRAVENOUS | Status: DC | PRN
  Administered 2017-11-27: 10 mg via INTRAVENOUS

## 2017-11-27 MED ORDER — FENTANYL CITRATE (PF) 100 MCG/2ML IJ SOLN
25.0000 ug | INTRAMUSCULAR | Status: DC | PRN
  Filled 2017-11-27: qty 1

## 2017-11-27 MED ORDER — ONDANSETRON HCL 4 MG/2ML IJ SOLN
INTRAMUSCULAR | Status: DC | PRN
  Administered 2017-11-27: 4 mg via INTRAVENOUS

## 2017-11-27 MED ORDER — CIPROFLOXACIN HCL 500 MG PO TABS: 500.0000 mg | ORAL_TABLET | Freq: Two times a day (BID) | ORAL | 0 refills | Status: DC

## 2017-11-27 MED ORDER — CIPROFLOXACIN IN D5W 400 MG/200ML IV SOLN
INTRAVENOUS | Status: AC
  Filled 2017-11-27: qty 200

## 2017-11-27 MED ORDER — PROPOFOL 10 MG/ML IV BOLUS
INTRAVENOUS | Status: DC | PRN
  Administered 2017-11-27: 130 mg via INTRAVENOUS

## 2017-11-27 MED ORDER — HYDROCODONE-ACETAMINOPHEN 10-325 MG PO TABS: 1.0000 | ORAL_TABLET | ORAL | 0 refills | Status: DC | PRN

## 2017-11-27 MED ORDER — LACTATED RINGERS IV SOLN
INTRAVENOUS | Status: DC
  Administered 2017-11-27: 08:00:00 via INTRAVENOUS
  Filled 2017-11-27: qty 1000

## 2017-11-27 MED ORDER — SODIUM CHLORIDE (PF) 0.9 % IJ SOLN
INTRAMUSCULAR | Status: DC | PRN
  Administered 2017-11-27: 10 mL via INTRAVENOUS

## 2017-11-27 MED ORDER — FENTANYL CITRATE (PF) 100 MCG/2ML IJ SOLN
INTRAMUSCULAR | Status: DC | PRN
  Administered 2017-11-27: 75 ug via INTRAVENOUS

## 2017-11-27 SURGICAL SUPPLY — 43 items
BAG URINE DRAINAGE (UROLOGICAL SUPPLIES) ×2 IMPLANT
BLADE CLIPPER SURG (BLADE) ×2 IMPLANT
CATH FOLEY 2WAY SLVR  5CC 16FR (CATHETERS) ×1
CATH FOLEY 2WAY SLVR 5CC 16FR (CATHETERS) ×1 IMPLANT
CATH ROBINSON RED A/P 16FR (CATHETERS) IMPLANT
CATH ROBINSON RED A/P 20FR (CATHETERS) ×2 IMPLANT
CLOTH BEACON ORANGE TIMEOUT ST (SAFETY) ×2 IMPLANT
CONT SPECI 4OZ STER CLIK (MISCELLANEOUS) ×4 IMPLANT
COVER BACK TABLE 60X90IN (DRAPES) ×2 IMPLANT
COVER MAYO STAND STRL (DRAPES) ×2 IMPLANT
COVER WAND RF STERILE (DRAPES) ×2 IMPLANT
DRSG TEGADERM 4X4.75 (GAUZE/BANDAGES/DRESSINGS) ×3 IMPLANT
DRSG TEGADERM 8X12 (GAUZE/BANDAGES/DRESSINGS) ×4 IMPLANT
GAUZE SPONGE 4X4 12PLY STRL (GAUZE/BANDAGES/DRESSINGS) ×2 IMPLANT
GLOVE BIO SURGEON STRL SZ 6 (GLOVE) ×1 IMPLANT
GLOVE BIO SURGEON STRL SZ 6.5 (GLOVE) IMPLANT
GLOVE BIO SURGEON STRL SZ7 (GLOVE) IMPLANT
GLOVE BIO SURGEON STRL SZ8 (GLOVE) ×2 IMPLANT
GLOVE BIOGEL PI IND STRL 6 (GLOVE) IMPLANT
GLOVE BIOGEL PI IND STRL 6.5 (GLOVE) IMPLANT
GLOVE BIOGEL PI IND STRL 7.0 (GLOVE) IMPLANT
GLOVE BIOGEL PI IND STRL 8 (GLOVE) IMPLANT
GLOVE BIOGEL PI INDICATOR 6 (GLOVE)
GLOVE BIOGEL PI INDICATOR 6.5 (GLOVE)
GLOVE BIOGEL PI INDICATOR 7.0 (GLOVE)
GLOVE BIOGEL PI INDICATOR 8 (GLOVE)
GLOVE ECLIPSE 8.0 STRL XLNG CF (GLOVE) ×8 IMPLANT
GOWN STRL REUS W/TWL XL LVL3 (GOWN DISPOSABLE) ×4 IMPLANT
HOLDER FOLEY CATH W/STRAP (MISCELLANEOUS) IMPLANT
I-Seed AgX100 ×78 IMPLANT
IMPL SPACEOAR SYSTEM 10ML (Spacer) ×1 IMPLANT
IMPLANT SPACEOAR SYSTEM 10ML (Spacer) ×2 IMPLANT
IV NS 1000ML (IV SOLUTION) ×2
IV NS 1000ML BAXH (IV SOLUTION) ×1 IMPLANT
KIT TURNOVER CYSTO (KITS) ×2 IMPLANT
MARKER SKIN DUAL TIP RULER LAB (MISCELLANEOUS) ×2 IMPLANT
PACK CYSTO (CUSTOM PROCEDURE TRAY) ×2 IMPLANT
SURGILUBE 2OZ TUBE FLIPTOP (MISCELLANEOUS) ×2 IMPLANT
SUT BONE WAX W31G (SUTURE) IMPLANT
SYR 10ML LL (SYRINGE) IMPLANT
TOWEL OR 17X24 6PK STRL BLUE (TOWEL DISPOSABLE) ×4 IMPLANT
UNDERPAD 30X30 (UNDERPADS AND DIAPERS) ×4 IMPLANT
WATER STERILE IRR 500ML POUR (IV SOLUTION) ×2 IMPLANT

## 2017-11-27 NOTE — Discharge Instructions (Signed)
DISCHARGE INSTRUCTIONS FOR PROSTATE SEED IMPLANTATION ° °Antibiotics °You may be given a prescription for an antibiotic to take when you arrive home. If so, be sure to take every tablet in the bottle, even if you are feeling better before the prescription is finished. If you begin itching, notice a rash or start to swell on your trunk, arms, legs and/or throat, immediately stop taking the antibiotic and call your Urologist. °Diet °Resume your usual diet when you return home. To keep your bowels moving easily and softly, drink prune, apple and cranberry juice at room temperature. You may also take a stool softener, such as Colace, which is available without prescription at local pharmacies. °Daily activities °? No driving or heavy lifting for at least two days after the implant. °? No bike riding, horseback riding or riding lawn mowers for the first month after the implant. °? Any strenuous physical activity should be approved by your doctor before you resume it. °Sexual relations °You may resume sexual relations two weeks after the procedure. A condom should be used for the first two weeks. Your semen may be dark brown or black; this is normal and is related bleeding that may have occurred during the implant. °Postoperative swelling °Expect swelling and bruising of the scrotum and perineum (the area between the scrotum and anus). Both the swelling and the bruising should resolve in l or 2 weeks. Ice packs and over- the-counter medications such as Tylenol, Advil or Aleve may lessen your discomfort. °Postoperative urination °Most men experience burning on urination and/or urinary frequency. If this becomes bothersome, contact your Urologist.  Medication can be prescribed to relieve these problems.  It is normal to have some blood in your urine for a few days after the implant. °Special instructions related to the seeds °It is unlikely that you will pass an Iodine-125 seed in your urine. The seeds are silver in color  and are about as large as a grain of rice. If you pass a seed, do not handle it with your fingers. Use a spoon to place it in an envelope or jar in place this in base occluded area such as the garage or basement for return to the radiation clinic at your convenience. ° °Contact your doctor for °? Temperature greater than 101 F °? Increasing pain °? Inability to urinate °Follow-up ° You should have follow up with your urologist and radiation oncologist about 3 weeks after the procedure. °General information regarding Iodine seeds °? Iodine-125 is a low energy radioactive material. It is not deeply penetrating and loses energy at short distances. Your prostate will absorb the radiation. Objects that are touched or used by the patient do not become radioactive. °? Body wastes (urine and stool) or body fluids (saliva, tears, semen or blood) are not radioactive. °? The Nuclear Regulatory Commission (NRC) has determined that no radiation precautions are needed for patients undergoing Iodine-125 seed implantation. The NRC states that such patients do not present a risk to the people around them, including Dustin Bauer children and pregnant women. However, in keeping with the general principle that radiation exposure should be kept as low reasonably possible, we suggest the following: °? Children and pets should not sit on the patient's lap for the first two (2) weeks after the implant. °? Pregnant (or possibly pregnant) women should avoid prolonged, close contact with the patient for the first two (2) weeks after the implant. °? A distance of three (3) feet is acceptable. °? At a distance of three (3)   with the patient.     Post Anesthesia Home Care Instructions  Activity: Get plenty of rest for the remainder of the day. A responsible individual must stay with you for 24 hours following the procedure.  For the next 24 hours, DO NOT: -Drive a car -Operate machinery -Drink alcoholic  beverages -Take any medication unless instructed by your physician -Make any legal decisions or sign important papers.  Meals: Start with liquid foods such as gelatin or soup. Progress to regular foods as tolerated. Avoid greasy, spicy, heavy foods. If nausea and/or vomiting occur, drink only clear liquids until the nausea and/or vomiting subsides. Call your physician if vomiting continues.  Special Instructions/Symptoms: Your throat may feel dry or sore from the anesthesia or the breathing tube placed in your throat during surgery. If this causes discomfort, gargle with warm salt water. The discomfort should disappear within 24 hours.      

## 2017-11-27 NOTE — Op Note (Signed)
PATIENT:  Dustin Bauer  PRE-OPERATIVE DIAGNOSIS:  Adenocarcinoma of the prostate  POST-OPERATIVE DIAGNOSIS:  Same  PROCEDURE:  1. I-125 radioactive seed implantation 2. Cystoscopy  3. Placement of SpaceOAR  SURGEON:  Surgeon(s): Claybon Jabs  Radiation oncologist: Dr. Tyler Pita  ANESTHESIA:  General  EBL:  Minimal  DRAINS: None  INDICATION: Dustin Bauer is a 72 year old male with adenocarcinoma Gleason 3+4 = 7 and some Gleason 6 with a PSA of 3.8 who was diagnosed due to the finding of an abnormal DRE.  He presents today for I-125 seed implant and spaceOAR.  Description of procedure: After informed consent the patient was brought to the major OR, placed on the table and administered general anesthesia. He was then moved to the modified lithotomy position with his perineum perpendicular to the floor. His perineum and genitalia were then sterilely prepped. An official timeout was then performed. A 16 French Foley catheter was then placed in the bladder and filled with dilute contrast, a rectal tube was placed in the rectum and the transrectal ultrasound probe was placed in the rectum and affixed to the stand. He was then sterilely draped.  Real time ultrasonography was used along with the seed planning software [employer] Prostate vs. 4.2.2.4. This was used to develop the seed plan including the number of needles as well as number of seeds required for complete and adequate coverage.  The needles were then preloaded with seeds and spacers according to the previously developed plan.  Real-time ultrasonography was then used along with the previously developed plan to implant a total of 78 seeds using 27 needles. This proceeded without difficulty or complication.   I then proceeded with placement of SpaceOAR by introducing a needle with the bevel angled inferiorly approximately 2 cm superior to the anus. This was angled downward and under direct ultrasound was placed within the space  between the prostatic capsule and rectum. This was confirmed with a small amount of sterile saline injected and this was performed under direct ultrasound. I then attached the SpaceOAR to the needle and injected this in the space between the prostate and rectum with good placement noted.  A Foley catheter was then removed as well as the transrectal ultrasound probe and rectal probe. Flexible cystoscopy was then performed using the 17 French flexible scope which revealed a normal urethra throughout its length down to the sphincter which appeared intact. The prostatic urethra revealed bilobar hypertrophy but no evidence of obstruction, seeds, spacers or lesions. The bladder was then entered and fully and systematically inspected. The ureteral orifices were noted to be of normal configuration and position. The mucosa revealed no evidence of tumors. There were also no stones identified within the bladder. I noted no seeds or spacers on the floor of the bladder and retroflexion of the scope revealed no seeds protruding from the base of the prostate.  The cystoscope was then removed and the patient was awakened and taken to recovery room in stable and satisfactory condition. He tolerated procedure well and there were no intraoperative complications.

## 2017-11-27 NOTE — Anesthesia Procedure Notes (Signed)
Procedure Name: LMA Insertion Date/Time: 11/27/2017 9:42 AM Performed by: Suan Halter, CRNA Pre-anesthesia Checklist: Patient identified, Emergency Drugs available, Suction available and Patient being monitored Patient Re-evaluated:Patient Re-evaluated prior to induction Oxygen Delivery Method: Circle system utilized Preoxygenation: Pre-oxygenation with 100% oxygen Induction Type: IV induction Ventilation: Mask ventilation without difficulty LMA: LMA inserted LMA Size: 4.0 Number of attempts: 1 Airway Equipment and Method: Bite block Placement Confirmation: positive ETCO2 Tube secured with: Tape Dental Injury: Teeth and Oropharynx as per pre-operative assessment

## 2017-11-27 NOTE — Transfer of Care (Signed)
Immediate Anesthesia Transfer of Care Note  Patient: Dustin Bauer  Procedure(s) Performed: Procedure(s) (LRB): RADIOACTIVE SEED IMPLANT/BRACHYTHERAPY IMPLANT (N/A) SPACE OAR INSTILLATION (N/A)  Patient Location: PACU  Anesthesia Type: General  Level of Consciousness: awake, oriented, sedated and patient cooperative  Airway & Oxygen Therapy: Patient Spontanous Breathing and Patient connected to face mask oxygen  Post-op Assessment: Report given to PACU RN and Post -op Vital signs reviewed and stable  Post vital signs: Reviewed and stable  Complications: No apparent anesthesia complications Last Vitals:  Vitals Value Taken Time  BP 109/69 11/27/2017 10:47 AM  Temp    Pulse 36 11/27/2017 10:51 AM  Resp 16 11/27/2017 10:51 AM  SpO2 94 % 11/27/2017 10:51 AM  Vitals shown include unvalidated device data.  Last Pain:  Vitals:   11/27/17 0746  TempSrc:   PainSc: 0-No pain      Patients Stated Pain Goal: 5 (11/27/17 0746)

## 2017-11-27 NOTE — Anesthesia Postprocedure Evaluation (Signed)
Anesthesia Post Note  Patient: SCORPIO FORTIN  Procedure(s) Performed: RADIOACTIVE SEED IMPLANT/BRACHYTHERAPY IMPLANT (N/A Prostate) SPACE OAR INSTILLATION (N/A Prostate)     Patient location during evaluation: PACU Anesthesia Type: General Level of consciousness: awake and alert Pain management: pain level controlled Vital Signs Assessment: post-procedure vital signs reviewed and stable Respiratory status: spontaneous breathing, nonlabored ventilation and respiratory function stable Cardiovascular status: blood pressure returned to baseline and stable Postop Assessment: no apparent nausea or vomiting Anesthetic complications: no    Last Vitals:  Vitals:   11/27/17 1048 11/27/17 1100  BP: 109/69 121/73  Pulse: 67 68  Resp: 13 17  Temp: 36.4 C   SpO2: 96% 94%    Last Pain:  Vitals:   11/27/17 1100  TempSrc:   PainSc: 0-No pain                 Brennan Bailey

## 2017-11-29 NOTE — Progress Notes (Signed)
  Radiation Oncology         [phone]) [phone] ________________________________  Name: Dustin Bauer MRN: [id number]  Date: 11/29/2017  DOB: January 01, 1946       Prostate Seed Implant  CC:Chipper Herb, MD  No ref. provider found  DIAGNOSIS: 72 y.o. gentleman with Stage T2b adenocarcinoma of the prostate with Gleason Score of 3+4, and PSA of 3.8    ICD-10-CM   1. Prostate cancer ([place]) C61 DG Chest 2 View    DG Chest 2 View    Discharge patient    PROCEDURE: Insertion of radioactive I-125 seeds into the prostate gland.  RADIATION DOSE: 145 Gy, definitive therapy.  TECHNIQUE: CONO GEBHARD was brought to the operating room with the urologist. He was placed in the dorsolithotomy position. He was catheterized and a rectal tube was inserted. The perineum was shaved, prepped and draped. The ultrasound probe was then introduced into the rectum to see the prostate gland.  TREATMENT DEVICE: A needle grid was attached to the ultrasound probe stand and anchor needles were placed.  3D PLANNING: The prostate was imaged in 3D using a sagittal sweep of the prostate probe. These images were transferred to the planning computer. There, the prostate, urethra and rectum were defined on each axial reconstructed image. Then, the software created an optimized 3D plan and a few seed positions were adjusted. The quality of the plan was reviewed using [facility] information for the target and the following two organs at risk:  Urethra and Rectum.  Then the accepted plan was printed and handed off to the radiation therapist.  Under my supervision, the custom loading of the seeds and spacers was carried out and loaded into sealed vicryl sleeves.  These pre-loaded needles were then placed into the needle holder.Marland Kitchen  PROSTATE VOLUME STUDY:  Using transrectal ultrasound the volume of the prostate was verified to be 34.8 cc.  SPECIAL TREATMENT PROCEDURE/SUPERVISION AND HANDLING: The pre-loaded needles were then delivered under  sagittal guidance. A total of 28 needles were used to deposit 78 seeds in the prostate gland. The individual seed activity was 0.379 mCi.  SpaceOAR:  Yes  COMPLEX SIMULATION: At the end of the procedure, an anterior radiograph of the pelvis was obtained to document seed positioning and count. Cystoscopy was performed to check the urethra and bladder.  MICRODOSIMETRY: At the end of the procedure, the patient was emitting less than 0.5 mR/hr at 1 meter. Accordingly, he was considered safe for hospital discharge.  PLAN: The patient will return to the radiation oncology clinic for post implant CT dosimetry in three weeks.   ________________________________  Sheral Apley Tammi Klippel, M.D.

## 2017-11-30 ENCOUNTER — Encounter (HOSPITAL_BASED_OUTPATIENT_CLINIC_OR_DEPARTMENT_OTHER): Payer: Self-pay | Admitting: Urology

## 2017-12-08 ENCOUNTER — Telehealth: Payer: Self-pay | Admitting: *Deleted

## 2017-12-08 NOTE — Telephone Encounter (Signed)
CALLED PATIENT TO INFORM OF POST SEED APPTS. AND MRI FOR 12-09-17, SPOKE WITH PATIENT AND HE IS AWARE OF THESE APPTS.

## 2017-12-09 ENCOUNTER — Encounter: Payer: Self-pay | Admitting: Radiation Oncology

## 2017-12-09 ENCOUNTER — Other Ambulatory Visit: Payer: Self-pay

## 2017-12-09 ENCOUNTER — Ambulatory Visit
Admission: RE | Admit: 2017-12-09 | Discharge: 2017-12-09 | Disposition: A | Discharge status: Home / Self Care | Payer: Medicare Other | Source: Ambulatory Visit | Attending: Radiation Oncology | Admitting: Radiation Oncology

## 2017-12-09 ENCOUNTER — Ambulatory Visit (HOSPITAL_COMMUNITY)
Admission: RE | Admit: 2017-12-09 | Discharge: 2017-12-09 | Disposition: A | Discharge status: Home / Self Care | Payer: Medicare Other | Source: Ambulatory Visit | Attending: Urology | Admitting: Urology

## 2017-12-09 VITALS — BP 143/75 | HR 58 | Temp 97.8°F | Resp 20 | Ht 70.0 in | Wt 165.2 lb

## 2017-12-09 DIAGNOSIS — C61 Malignant neoplasm of prostate: Secondary | ICD-10-CM | POA: Diagnosis not present

## 2017-12-09 NOTE — Progress Notes (Signed)
Radiation Oncology         [phone] ________________________________  Name: Dustin Bauer MRN: [id number]  Date: 12/09/2017  DOB: 30-Apr-1945  Post-Seed Follow-Up Visit Note  CC: Chipper Herb, MD  Kathie Rhodes, MD  Diagnosis:   72 y.o. gentleman with Stage T2b adenocarcinoma of the prostate with Gleason Score of 3+4, and PSA of 3.8  No diagnosis found.  Interval Since Last Radiation:  12 days  11/27/17: Insertion of radioactive I-125 seeds into the prostate gland; 145 Gy, definitive therapy with placement of SpaceOAR gel.  Narrative:  The patient returns today for routine follow-up.  He is complaining of increased urinary frequency and urinary hesitation symptoms. He filled out a questionnaire regarding urinary function today providing and overall IPSS score of 23 characterizing his symptoms as severe.  He reports rare burning with urination, improved since surgery but denies gross hematuria flank pain, straining to void, fever or chills.  He also reports occasional bladder leakage, mostly associated with incomplete emptying.  He denies abdominal pain, nausea, vomiting or diarrhea but notes intermittent constipation, improving over the past 4-5 days.  His pre-implant IPSS score was 12.  Overall, he is pleased with his progress to date.  He is scheduled for prostate MRI to confirm SpaceOAR placement after his appointment here today.  ALLERGIES:  is allergic to clopidogrel; crestor [rosuvastatin calcium]; lipitor [atorvastatin calcium]; and statins.  Meds: Current Outpatient Medications  Medication Sig Dispense Refill  . acetaminophen (TYLENOL) 325 MG tablet Take 325 mg by mouth every 6 (six) hours as needed (FOR PAIN.).    Marland Kitchen Alirocumab (PRALUENT) 75 MG/ML SOPN Inject 1 pen into the skin every 14 (fourteen) days. 2 pen 11  . Cholecalciferol (SM VITAMIN D3) 4000 units CAPS Take 4,000 Units by mouth daily.    . ciprofloxacin (CIPRO) 500 MG tablet Take 1 tablet (500 mg total) by mouth 2  (two) times daily. 6 tablet 0  . HYDROcodone-acetaminophen (NORCO) 10-325 MG tablet Take 1-2 tablets by mouth every 4 (four) hours as needed for moderate pain. Maximum dose per 24 hours - 8 pills 8 tablet 0  . ketoconazole (NIZORAL) 2 % cream Apply 1 application topically daily as needed for irritation. (Patient not taking: Reported on 09/10/2017) 15 g 1  . Melatonin 3 MG TABS Take 3 mg by mouth at bedtime.    . Naftifine HCl (NAFTIN) 2 % GEL Apply to affected area daily as directed (Patient not taking: Reported on 11/24/2017) 45 g 2  . XARELTO 20 MG TABS tablet TAKE 1 TABLET DAILY WITH SUPPER (Patient taking differently: Stop 2 days before per cardiology) 90 tablet 1   No current facility-administered medications for this encounter.     Physical Findings: In general this is a well appearing Caucasian gentleman in no acute distress. He's alert and oriented x4 and appropriate throughout the examination. Cardiopulmonary assessment is negative for acute distress and he exhibits normal effort.   Lab Findings: Lab Results  Component Value Date   WBC 7.2 11/20/2017   HGB 14.3 11/20/2017   HCT 45.2 11/20/2017   MCV 98.9 11/20/2017   PLT 201 11/20/2017    Radiographic Findings:  Patient underwent CT imaging in our clinic for post implant dosimetry. The CT was reviewed by Dr. Tammi Klippel and appears to demonstrate an adequate distribution of radioactive seeds throughout the prostate gland. There are no seeds in or near the rectum.  He will have an MRI prostate today at noon and these images will be  fused with his CT images for further evaluation.  We suspect the final radiation plan and dosimetry will show appropriate coverage of the prostate gland.   Impression/Plan: The patient is recovering from the effects of radiation. His urinary symptoms should gradually improve over the next 4-6 months. We talked about this today. He is encouraged by his improvement already and is otherwise pleased with his  outcome. We also talked about long-term follow-up for prostate cancer following seed implant. He understands that ongoing PSA determinations and digital rectal exams will help perform surveillance to rule out disease recurrence. He has a follow up appointment scheduled with Jiles Crocker, NP on 12/18/17 and anticipates a follow up visit with Dr. Karsten Ro in Feb. 2020. He understands what to expect with his PSA measures. Patient was also educated today about some of the long-term effects from radiation including a small risk for rectal bleeding and possibly erectile dysfunction. We talked about some of the general management approaches to these potential complications. However, I did encourage the patient to contact our office or return at any point if he has questions or concerns related to his previous radiation and prostate cancer.    Nicholos Johns, PA-C  This document serves as a record of services personally performed by [employer], PA-C. It was created on her behalf by Wilburn Mylar, a trained medical scribe. The creation of this record is based on the scribe's personal observations and the provider's statements to them. This document has been checked and approved by the attending provider.

## 2017-12-09 NOTE — Progress Notes (Signed)
Mr. Trieu presents today for POST seed visit. Pt reports that burning with urination has eased and is rare at this time. Pt denies hematuria. Pt reports occasional bladder leakage, mainly associated with incomplete emptying. Pt's IPSS score is 23. Pt denies diarrhea and instead reports constipation. Pt's next urology appt is Thursday, 12/18/17. Pt's MRI is scheduled today at noon.   Loma Sousa, RN BSN

## 2017-12-09 NOTE — Progress Notes (Signed)
  Radiation Oncology         [phone] ________________________________  Name: CHAE OOMMEN MRN: [id number]  Date: 12/09/2017  DOB: [birth year]  COMPLEX SIMULATION NOTE  NARRATIVE:  The patient was brought to the [facility] suite today following prostate seed implantation approximately one month ago.  Identity was confirmed.  All relevant records and images related to the planned course of therapy were reviewed.  Then, the patient was set-up supine.  CT images were obtained.  The CT images were loaded into the planning software.  Then the prostate and rectum were contoured.  Treatment planning then occurred.  The implanted iodine 125 seeds were identified by the physics staff for projection of radiation distribution  I have requested : 3D Simulation  I have requested a DVH of the following structures: Prostate and rectum.    ________________________________  Sheral Apley Tammi Klippel, M.D.  This document serves as a record of services personally performed by Tyler Pita, MD. It was created on his behalf by Wilburn Mylar, a trained medical scribe. The creation of this record is based on the scribe's personal observations and the provider's statements to them. This document has been checked and approved by the attending provider.

## 2017-12-14 ENCOUNTER — Ambulatory Visit: Payer: Medicare Other | Admitting: Family Medicine

## 2017-12-25 ENCOUNTER — Encounter: Payer: Self-pay | Admitting: Family Medicine

## 2017-12-25 ENCOUNTER — Ambulatory Visit: Payer: Medicare Other | Admitting: Family Medicine

## 2017-12-25 VITALS — BP 177/88 | HR 45 | Temp 96.9°F | Ht 70.0 in | Wt 169.0 lb

## 2017-12-25 DIAGNOSIS — K311 Adult hypertrophic pyloric stenosis: Secondary | ICD-10-CM

## 2017-12-25 DIAGNOSIS — E559 Vitamin D deficiency, unspecified: Secondary | ICD-10-CM | POA: Diagnosis not present

## 2017-12-25 DIAGNOSIS — E7800 Pure hypercholesterolemia, unspecified: Secondary | ICD-10-CM

## 2017-12-25 DIAGNOSIS — R972 Elevated prostate specific antigen [PSA]: Secondary | ICD-10-CM

## 2017-12-25 DIAGNOSIS — I10 Essential (primary) hypertension: Secondary | ICD-10-CM

## 2017-12-25 DIAGNOSIS — I2510 Atherosclerotic heart disease of native coronary artery without angina pectoris: Secondary | ICD-10-CM

## 2017-12-25 DIAGNOSIS — I714 Abdominal aortic aneurysm, without rupture, unspecified: Secondary | ICD-10-CM

## 2017-12-25 DIAGNOSIS — C61 Malignant neoplasm of prostate: Secondary | ICD-10-CM

## 2017-12-25 DIAGNOSIS — R918 Other nonspecific abnormal finding of lung field: Secondary | ICD-10-CM

## 2017-12-25 DIAGNOSIS — I480 Paroxysmal atrial fibrillation: Secondary | ICD-10-CM | POA: Diagnosis not present

## 2017-12-25 NOTE — Progress Notes (Signed)
Subjective:    Patient ID: Dustin Bauer, male    DOB: 27-May-1945, 72 y.o.   MRN: [id number]  HPI Pt here for follow up and management of chronic medical problems which includes hypertension and hyperlipidemia. He is taking medication regularly.  Patient is doing well today with no specific complaints.  He is seen the urologist and has made the decision to receive radiation therapy for his prostate cancer.  He was given an FOBT to return and will get lab work today.  His weight today was 169.  His blood pressure was slightly elevated.  He is requesting a refill on his ketoconazole cream.  His last colonoscopy was in June 2018.  The patient has a history of an MI and paroxysmal atrial fibrillation in addition to his prostate cancer.  He has had an abnormal chest CT and is being followed regularly by the pulmonologist.  He also has an abdominal aortic aneurysm.  Dr. Trula Slade  is following him for this. The pulmonologist is also following his chest CT abnormalities.  Patient has a family history of pancreatic cancer and colon cancer.  The patient has an abdominal aortic aneurysm , prostate cancer, pulmonary nodules, abdominal aortic aneurysm and has had a colonoscopy by Dr. Havery Moros and an endoscopy recently.  Today he denies any chest pain or increased shortness of breath other than being somewhat out of shape from his recent seed implants for prostate cancer.  He did have some swallowing issues but had an endoscopy and dilatation and this is much better.  He denies any other symptoms with his GI track and he is up-to-date on his colonoscopies.  He is passing his water well other than it being slow.   Patient Active Problem List   Diagnosis Date Noted  . Malignant neoplasm of prostate ([place]) 09/10/2017  . Pyloric stricture   . Nausea and vomiting 05/01/2016  . History of colonic polyps 05/01/2016  . Dysphagia 05/01/2016  . Chronic anticoagulation 05/01/2016  . History of heart block 08/13/2015  .  Dyspnea 06/13/2015  . Multiple pulmonary nodules 06/13/2015  . Hypertensive cardiovascular disease 04/17/2014  . CAD (coronary artery disease) 12/16/2013  . History of acute inferior wall myocardial infarction 07/15/2013  . Abnormal chest CT 05/30/2013  . Elevated PSA 05/30/2013  . Hyperlipidemia 06/24/2012  . Paroxysmal atrial fibrillation ([place]) 06/24/2012  . Statin intolerance 06/24/2012   Outpatient Encounter Medications as of 12/25/2017  Medication Sig  . acetaminophen (TYLENOL) 325 MG tablet Take 325 mg by mouth every 6 (six) hours as needed (FOR PAIN.).  Marland Kitchen Alirocumab (PRALUENT) 75 MG/ML SOPN Inject 1 pen into the skin every 14 (fourteen) days.  . Cholecalciferol (SM VITAMIN D3) 4000 units CAPS Take 4,000 Units by mouth daily.  Marland Kitchen HYDROcodone-acetaminophen (NORCO) 10-325 MG tablet Take 1-2 tablets by mouth every 4 (four) hours as needed for moderate pain. Maximum dose per 24 hours - 8 pills  . ketoconazole (NIZORAL) 2 % cream Apply 1 application topically daily as needed for irritation.  . Melatonin 3 MG TABS Take 3 mg by mouth at bedtime.  . Naftifine HCl (NAFTIN) 2 % GEL Apply to affected area daily as directed  . tamsulosin (FLOMAX) 0.4 MG CAPS capsule Take 0.4 mg by mouth daily after supper.  Alveda Reasons 20 MG TABS tablet TAKE 1 TABLET DAILY WITH SUPPER (Patient taking differently: Stop 2 days before per cardiology)  . [DISCONTINUED] ciprofloxacin (CIPRO) 500 MG tablet Take 1 tablet (500 mg total) by mouth 2 (two)  times daily.   No facility-administered encounter medications on file as of 12/25/2017.      Review of Systems  Constitutional: Negative.   HENT: Negative.   Eyes: Negative.   Respiratory: Negative.   Cardiovascular: Negative.   Gastrointestinal: Negative.   Endocrine: Negative.   Genitourinary: Negative.   Musculoskeletal: Negative.   Skin: Negative.   Allergic/Immunologic: Negative.   Neurological: Negative.   Hematological: Negative.     Psychiatric/Behavioral: Negative.        Objective:   Physical Exam Vitals signs and nursing note reviewed.  Constitutional:      Appearance: Normal appearance. He is well-developed and normal weight.     Comments: The patient is pleasant and alert and cannot remember the names of all the physicians that or following him currently.  HENT:     Head: Normocephalic and atraumatic.     Right Ear: Tympanic membrane, ear canal and external ear normal. There is no impacted cerumen.     Left Ear: Tympanic membrane, ear canal and external ear normal. There is no impacted cerumen.     Nose: Congestion present.     Mouth/Throat:     Mouth: Mucous membranes are moist.     Pharynx: Oropharynx is clear. No oropharyngeal exudate.  Eyes:     General: No scleral icterus.       Right eye: No discharge.        Left eye: No discharge.     Conjunctiva/sclera: Conjunctivae normal.     Pupils: Pupils are equal, round, and reactive to light.  Neck:     Musculoskeletal: Normal range of motion and neck supple. No neck rigidity.     Thyroid: No thyromegaly.     Vascular: No carotid bruit.     Trachea: No tracheal deviation.     Comments: No bruits thyromegaly or anterior cervical adenopathy Cardiovascular:     Rate and Rhythm: Normal rate. Rhythm irregular.     Heart sounds: Normal heart sounds. No murmur.     Comments: Heart was slightly irregular at 60/min Pulmonary:     Effort: Pulmonary effort is normal.     Breath sounds: Normal breath sounds. No wheezing or rales.     Comments: No axillary adenopathy and no chest wall masses Chest:     Chest wall: No tenderness.  Abdominal:     General: Abdomen is flat. Bowel sounds are normal.     Palpations: Abdomen is soft. There is no mass.     Tenderness: There is no abdominal tenderness.     Comments: No liver or spleen enlargement.  No epigastric tenderness.  Genitourinary:    Comments: Patient is followed regularly by Dr. Charlott Rakes because of his  prostate cancer. Musculoskeletal: Normal range of motion.        General: No tenderness.  Lymphadenopathy:     Cervical: No cervical adenopathy.  Skin:    General: Skin is warm and dry.     Findings: No rash.  Neurological:     Mental Status: He is alert and oriented to person, place, and time.     Cranial Nerves: No cranial nerve deficit.     Deep Tendon Reflexes: Reflexes are normal and symmetric.     Comments: Reflexes are 2+ and equal bilaterally  Psychiatric:        Mood and Affect: Mood normal.        Behavior: Behavior normal.        Thought Content: Thought content normal.  Judgment: Judgment normal.    BP (!) 144/80 (BP Location: Left Arm)   Pulse (!) 45   Temp (!) 96.9 F (36.1 C) (Oral)   Ht _0  (1.778 m)   Wt 169 lb (76.7 kg)   BMI 24.25 kg/m   Repeat blood pressure was higher by the nurse and patient will come by in a couple weeks for another blood pressure reading and will try to watch his sodium intake more closely.      Assessment & Plan:  1. Pure hypercholesterolemia -Continue with current treatment and with as aggressive therapeutic lifestyle changes as possible including diet and exercise - CBC with Differential/Platelet - Lipid panel  2. Essential hypertension -Blood pressure was slightly elevated today and there will be no change in treatment and he will come by in a couple weeks for repeat blood pressure reading - CBC with Differential/Platelet - BMP8+EGFR - Hepatic function panel  3. Paroxysmal atrial fibrillation (H[facility]) -Continue to follow-up with cardiology - CBC with Differential/Platelet  4. Vitamin D deficiency -Continue with vitamin D replacement pending results of lab work - CBC with Differential/Platelet - VITAMIN D 25 Hydroxy (Vit-D Deficiency, Fractures)  5. ASCVD (arteriosclerotic cardiovascular disease) -Continue to follow-up with cardiology and vascular surgery - CBC with Differential/Platelet  6. Elevated  PSA -Patient has prostate cancer he will continue to follow-up with radiation oncology and his urologist. - CBC with Differential/Platelet  7.  Prostate cancer -Continue to follow-up with radiation oncology and urology  8.  Abdominal aortic aneurysm -Continue to follow-up with Dr. Trula Slade  9.  Pulmonary nodules -Plan to repeat CT scan in August and follow-up with Dr. Halford Chessman and Trula Slade  No orders of the defined types were placed in this encounter.  Patient Instructions                       Medicare Annual Wellness Visit  [facility] and the medical providers at [facility] strive to bring you the best medical care.  In doing so we not only want to address your current medical conditions and concerns but also to detect new conditions early and prevent illness, disease and health-related problems.    Medicare offers a yearly Wellness Visit which allows our clinical staff to assess your need for preventative services including immunizations, lifestyle education, counseling to decrease risk of preventable diseases and screening for fall risk and other medical concerns.    This visit is provided free of charge (no copay) for all Medicare recipients. The clinical pharmacists at [facility] have begun to conduct these Wellness Visits which will also include a thorough review of all your medications.    As you primary medical provider recommend that you make an appointment for your Annual Wellness Visit if you have not done so already this year.  You may set up this appointment before you leave today or you may call back ([phone]) and schedule an appointment.  Please make sure when you call that you mention that you are scheduling your Annual Wellness Visit with the clinical pharmacist so that the appointment may be made for the proper length of time.     Continue current medications. Continue good therapeutic lifestyle changes which include good diet  and exercise. Fall precautions discussed with patient. If an FOBT was given today- please return it to our front desk. If you are over 59 years old - you may need Prevnar 15 or the adult Pneumonia vaccine.  **  Flu shots are available--- please call and schedule a FLU-CLINIC appointment**  After your visit with [place] today you will receive a survey in the mail or online from [employer] regarding your care with [place]. Please take a moment to fill this out. Your feedback is very important to [place] as you can help [place] better understand your patient needs as well as improve your experience and satisfaction. WE CARE ABOUT YOU!!!   Come by the office and have blood pressure rechecked in a couple of weeks Watch sodium intake Continue to follow-up with vascular surgery, cardiology, urology, radiation oncology, pulmonology, and gastroenterology as planned Drink plenty of fluids and stay well-hydrated His specialists include Dr. Rayann Heman, Dr. Trula Slade, Dr. Karsten Ro, Dr. Havery Moros, Dr. Halford Chessman  Arrie Senate MD

## 2017-12-25 NOTE — Patient Instructions (Addendum)
Medicare Annual Wellness Visit  [facility] and the medical providers at [facility] strive to bring you the best medical care.  In doing so we not only want to address your current medical conditions and concerns but also to detect new conditions early and prevent illness, disease and health-related problems.    Medicare offers a yearly Wellness Visit which allows our clinical staff to assess your need for preventative services including immunizations, lifestyle education, counseling to decrease risk of preventable diseases and screening for fall risk and other medical concerns.    This visit is provided free of charge (no copay) for all Medicare recipients. The clinical pharmacists at [facility] have begun to conduct these Wellness Visits which will also include a thorough review of all your medications.    As you primary medical provider recommend that you make an appointment for your Annual Wellness Visit if you have not done so already this year.  You may set up this appointment before you leave today or you may call back [phone]) and schedule an appointment.  Please make sure when you call that you mention that you are scheduling your Annual Wellness Visit with the clinical pharmacist so that the appointment may be made for the proper length of time.     Continue current medications. Continue good therapeutic lifestyle changes which include good diet and exercise. Fall precautions discussed with patient. If an FOBT was given today- please return it to our front desk. If you are over 51 years old - you may need Prevnar 68 or the adult Pneumonia vaccine.  **Flu shots are available--- please call and schedule a FLU-CLINIC appointment**  After your visit with [place] today you will receive a survey in the mail or online from [employer] regarding your care with [place]. Please take a moment to fill this out. Your feedback is very  important to [place] as you can help [place] better understand your patient needs as well as improve your experience and satisfaction. WE CARE ABOUT YOU!!!   Come by the office and have blood pressure rechecked in a couple of weeks Watch sodium intake Continue to follow-up with vascular surgery, cardiology, urology, radiation oncology, pulmonology, and gastroenterology as planned Drink plenty of fluids and stay well-hydrated His specialists include Dr. Rayann Heman, Dr. Trula Slade, Dr. Karsten Ro, Dr. Havery Moros, Dr. Halford Chessman

## 2017-12-26 LAB — BMP8+EGFR
BUN / CREAT RATIO: 16 (ref 10–24)
BUN: 15 mg/dL (ref 8–27)
CALCIUM: 9.4 mg/dL (ref 8.6–10.2)
CHLORIDE: 104 mmol/L (ref 96–106)
CO2: 25 mmol/L (ref 20–29)
Creatinine, Ser: 0.96 mg/dL (ref 0.76–1.27)
GFR, EST AFRICAN AMERICAN: 91 mL/min/{1.73_m2} (ref >59)
GFR, EST NON AFRICAN AMERICAN: 79 mL/min/{1.73_m2} (ref >59)
Glucose: 83 mg/dL (ref 65–99)
POTASSIUM: 4.6 mmol/L (ref 3.5–5.2)
Sodium: 143 mmol/L (ref 134–144)

## 2017-12-26 LAB — CBC WITH DIFFERENTIAL/PLATELET
BASOS: 1 %
Basophils Absolute: 0.1 10*3/uL (ref 0.0–0.2)
EOS (ABSOLUTE): 0.3 10*3/uL (ref 0.0–0.4)
EOS: 4 %
HEMATOCRIT: 42 % (ref 37.5–51.0)
Hemoglobin: 14.1 g/dL (ref 13.0–17.7)
IMMATURE GRANULOCYTES: 0 %
Immature Grans (Abs): 0 10*3/uL (ref 0.0–0.1)
Lymphocytes Absolute: 2 10*3/uL (ref 0.7–3.1)
Lymphs: 28 %
MCH: 31.1 pg (ref 26.6–33.0)
MCHC: 33.6 g/dL (ref 31.5–35.7)
MCV: 93 fL (ref 79–97)
Monocytes Absolute: 0.7 10*3/uL (ref 0.1–0.9)
Monocytes: 9 %
NEUTROS ABS: 4.3 10*3/uL (ref 1.4–7.0)
Neutrophils: 58 %
Platelets: 247 10*3/uL (ref 150–450)
RBC: 4.53 x10E6/uL (ref 4.14–5.80)
RDW: 13.4 % (ref 12.3–15.4)
WBC: 7.3 10*3/uL (ref 3.4–10.8)

## 2017-12-26 LAB — HEPATIC FUNCTION PANEL
ALT: 14 IU/L (ref 0–44)
AST: 22 IU/L (ref 0–40)
Albumin: 4.2 g/dL (ref 3.5–4.8)
Alkaline Phosphatase: 105 IU/L (ref 39–117)
Bilirubin Total: 0.4 mg/dL (ref 0.0–1.2)
Bilirubin, Direct: 0.12 mg/dL (ref 0.00–0.40)
TOTAL PROTEIN: 6.6 g/dL (ref 6.0–8.5)

## 2017-12-26 LAB — LIPID PANEL
CHOL/HDL RATIO: 2.2 ratio (ref 0.0–5.0)
Cholesterol, Total: 121 mg/dL (ref 100–199)
HDL: 54 mg/dL (ref >39)
LDL Calculated: 53 mg/dL (ref 0–99)
Triglycerides: 71 mg/dL (ref 0–149)
VLDL CHOLESTEROL CAL: 14 mg/dL (ref 5–40)

## 2017-12-26 LAB — VITAMIN D 25 HYDROXY (VIT D DEFICIENCY, FRACTURES): Vit D, 25-Hydroxy: 32.3 ng/mL (ref 30.0–100.0)

## 2018-01-01 ENCOUNTER — Ambulatory Visit: Payer: Medicare Other | Admitting: *Deleted

## 2018-01-01 VITALS — BP 120/69 | HR 71

## 2018-01-01 DIAGNOSIS — Z0130 Encounter for examination of blood pressure without abnormal findings: Secondary | ICD-10-CM

## 2018-01-01 NOTE — Progress Notes (Addendum)
Pt here for BP check BP 120 69 P 71

## 2018-01-04 ENCOUNTER — Ambulatory Visit
Admission: RE | Admit: 2018-01-04 | Discharge: 2018-01-04 | Disposition: A | Discharge status: Home / Self Care | Payer: Medicare Other | Source: Ambulatory Visit | Attending: Radiation Oncology | Admitting: Radiation Oncology

## 2018-01-04 ENCOUNTER — Encounter: Payer: Self-pay | Admitting: Radiation Oncology

## 2018-01-04 DIAGNOSIS — C61 Malignant neoplasm of prostate: Secondary | ICD-10-CM | POA: Diagnosis not present

## 2018-01-05 NOTE — Progress Notes (Signed)
Good readings and continue with current treatment and sodium restriction

## 2018-01-10 NOTE — Progress Notes (Signed)
  Radiation Oncology         [phone] ________________________________  Name: Dustin Bauer MRN: [id number]  Date: 01/04/2018  DOB: 07-Dec-1945  3D Planning Note   Prostate Brachytherapy Post-Implant Dosimetry  Diagnosis: 72 y.o. gentleman with Stage T2b adenocarcinoma of the prostate with Gleason Score of 3+4, and PSA of 3.8  Narrative: On a previous date, [name] returned following prostate seed implantation for post implant planning. He underwent CT scan complex simulation to delineate the three-dimensional structures of the pelvis and demonstrate the radiation distribution.  Since that time, the seed localization, and complex isodose planning with dose volume histograms have now been completed.  Results:   Prostate Coverage - The dose of radiation delivered to the 90% or more of the prostate gland (D90) was 119.79% of the prescription dose. This exceeds our goal of greater than 90%. Rectal Sparing - The volume of rectal tissue receiving the prescription dose or higher was 0.0 cc. This falls under our thresholds tolerance of 1.0 cc.  Impression: The prostate seed implant appears to show adequate target coverage and appropriate rectal sparing.  Plan:  The patient will continue to follow with urology for ongoing PSA determinations. I would anticipate a high likelihood for local tumor control with minimal risk for rectal morbidity.  ________________________________  Sheral Apley Tammi Klippel, M.D.

## 2018-01-28 ENCOUNTER — Other Ambulatory Visit: Payer: Self-pay | Admitting: Cardiovascular Disease

## 2018-01-28 NOTE — Telephone Encounter (Signed)
Pt last saw Fabian Sharp, [place] on 06/09/17, last labs 12/25/17 Cr 0.96, age 73, weight 76.7kg, CrCl 74.35, based on CrCl pt is on appropriate dosage of Xarelto 20mg  QD.  Will refill rx.

## 2018-03-09 ENCOUNTER — Other Ambulatory Visit: Payer: Self-pay | Admitting: Family Medicine

## 2018-03-10 ENCOUNTER — Telehealth: Payer: Self-pay | Admitting: Cardiovascular Disease

## 2018-03-10 ENCOUNTER — Telehealth: Payer: Self-pay | Admitting: Family Medicine

## 2018-03-10 MED ORDER — ALIROCUMAB 75 MG/ML ~~LOC~~ SOAJ: 75.0000 mg | SUBCUTANEOUS | 1 refills | Status: DC

## 2018-03-10 NOTE — Telephone Encounter (Signed)
I will route to both Dr. Acie Fredrickson and Dr. Rayann Heman. I will include nurses for both doctors as well.

## 2018-03-10 NOTE — Telephone Encounter (Signed)
Pt would like to transfer care from Dr. Acie Fredrickson to Dr. Rayann Heman.  Pt called to schedule a follow-up appt with Dr. Acie Fredrickson, and was frustrated that there were no available appointments. On the 3rd attempt, he said he wants to see a provider with more availability. I told the pt that both doctors need to approve the switch before an appt could be scheduled, and he understood.

## 2018-03-10 NOTE — Telephone Encounter (Signed)
New prescription for 3 month supply sent to pharmacy, patient aware

## 2018-03-10 NOTE — Telephone Encounter (Signed)
He has CAD so really should stay with Dr Acie Fredrickson.  I've only seen him for afib previously. If he needs to be seen urgently for afib then perhaps he could be seen in the AF clinic.  Otherwise, I would encourage him to get in to see Dr Acie Fredrickson as soon as his schedule allows.

## 2018-03-11 NOTE — Telephone Encounter (Signed)
I would be happy to see the patient any time.  Thanks, GA

## 2018-03-11 NOTE — Telephone Encounter (Signed)
This patient is a previous patient of Aundra Dubin and I have never met him.   Was last seen by Dr. Aundra Dubin in Nov. 2017 and has been seen by several  [facility]PS since that time I am happy to see him at my next open appt. slot If he wants to switch primary cardiologist, he certainly may do that Perhaps a better match would be one of our newer partners who might have more availability - Dr. Margaretann Loveless or Dr. Harrell Gave

## 2018-03-15 ENCOUNTER — Telehealth: Payer: Self-pay | Admitting: Internal Medicine

## 2018-03-15 ENCOUNTER — Other Ambulatory Visit: Payer: Self-pay

## 2018-03-15 DIAGNOSIS — I714 Abdominal aortic aneurysm, without rupture, unspecified: Secondary | ICD-10-CM

## 2018-03-15 DIAGNOSIS — I701 Atherosclerosis of renal artery: Secondary | ICD-10-CM

## 2018-03-15 NOTE — Telephone Encounter (Signed)
Spoke with patient's wife per DPR and scheduled appointment with Dr. Acie Fredrickson for 4/2. I advised her to have patient call back to reschedule if needed. She verbalized understanding and agreement with plan.

## 2018-03-15 NOTE — Telephone Encounter (Signed)
New Message        Patient called in today to get an appt. For June, the June schedule is not open yet. Patient asked me to Cancell all of his future appts. And that he was going to find him another Cardiologist . I called patient back to make sure he wanted everything cancelled but there was no answer, so I left appt as is. Just an [place]

## 2018-03-15 NOTE — Telephone Encounter (Signed)
Spoke with pt he stated he is going to stick with Dr Cathie Olden, please call pt to schedule appointment

## 2018-03-15 NOTE — Telephone Encounter (Signed)
See phone note 03/10/18 about changing Cardiologist. Dr. Margaretann Loveless in the 03/10/18 phone note has stated that she will be happy to see the pt anytime. I am going to route this to NL office so that they may schedule pt in NL with Dr. Margaretann Loveless.

## 2018-03-17 NOTE — Progress Notes (Signed)
HISTORY AND PHYSICAL     CC:  follow up. Requesting Provider:  Chipper Herb, MD  HPI: This is a 73 y.o. male who is here today for follow up.  He was first seen by Dr. Trula Slade in 2017 for evaluation of a 3.9cm AAA that was detected on CT scan for weight loss.  He was seen a year ago and at that time, he has a hx of PAF and is on Xarelto for this.  He has hyperlipidemia but is not on a statin due to intolerance with muscle weakness.  He denies any abdominal or back pain.  He states [facility].  He denies any claudication symptoms.   He has a remote tobacco history as he quit smoking ~ 20 years ago.    Pt has a hx of prostate cancer with hx of brachytherapy since he was here last.  He states he has done very well with this and has appt with urologist this afternoon.   His CTA revealed that his AAA was essentially unchanged.  He did have narrowing of the left renal artery and BP was good and not on any antihypertensive medications.  He was scheduled to return in one year and he is here today for that appointment.   The pt returns today for follow up for AAA and renal artery stenosis.  He denies any abdominal or back pain.  He states he exercises daily and works out at [employer].    The pt is not on a statin for cholesterol management.   allergic The pt is not on an aspirin.    Other AC:  Xarelto The pt is not on medication for hypertension.  The pt does not have diabetes. Tobacco hx:  remote   Past Medical History:  Diagnosis Date  . AAA (abdominal aortic aneurysm) ([place]) 01/20/2017   3.9cm infrarenal  . Allergy to ACE inhibitors    Cough and fatigue with ACEI  . Aortic atherosclerosis ([place]) 08/18/2017   Noted on CT chest  . Bigeminy 10/23/2017   Noted on ECG  . CAD (coronary artery disease) stent x 1 2011 and 1  2014   a. LHC in 2011 for stable angina >> DES to pLAD [facility])  //  b. inf-post STEMI 7/15 c/b CHB with  jxn escape >> LHC: OM1 60-70, pLAD 30-40, m-d RCA occluded, EF 50-55 >> PCI: 3 x 33 mm Xience DES to RCA  //  c. LHC 8/17: dLM 20, pLAD 20, pLAD stent ok, pRI 30, pRCA stent ok with 30% ISR  . Colon polyps   . Complication of anesthesia    no memories from right after last colonscopy/endoscopy may 2018, did not last long  . Diverticulosis   . Emphysema lung ([place]) 08/18/2017   Noted on CT chest  . First degree AV block 10/23/2017   Noted on EKG  . Gastric stenosis 08/27/2016   at pylorus, noted on endoscopy  . GERD (gastroesophageal reflux disease)   . History of echocardiogram    Echo 12/15:  Mild LVH, EF 60-65%, normal wall motion, grade 1 diastolic dysfunction, LA upper limits of normal, mild RAE  . Hydrocele, bilateral 01/30/2017   Noted on CT chest  . Hydrocele, left   . Hyperlipidemia    Myalgias with Zocor, Crestor, atorvastatin.   Marland Kitchen Hyperplasia of prostate   . Myocardial infarction ([place]) 2014  . Nicotine abuse   . PAF (  paroxysmal atrial fibrillation) [facility])    s/p ablation by Dr Roxan Hockey at [facility] in 2012 // Xarelto anticoagulation  //  ILR removed 2/17 // Event Monitor 11/17: Predominantly NSR (90%). Paroxysmal atrial fibrillation (10%).  Atrial fibrillation appears to be rate-controlled when it occurs.   . Prostate cancer ([place]) dx 2019  . Pulmonary nodule, right 08/18/2017   6 mm right upper lobe, 5.5 mm right middle lobe, Noted on CT Chest  . Pyloric stenosis   . Thoracic ascending aortic aneurysm ([place]) 01/30/2017   3.9 cm    Past Surgical History:  Procedure Laterality Date  . ATRIAL FIBRILLATION ABLATION  07/2010   Afib ablation by Dr Roxan Hockey at [facility] in 2012  . BALLOON DILATION N/A 07/31/2016   Procedure: BALLOON DILATION;  Surgeon: Manus Gunning, MD;  Location: [facility];  Service: Gastroenterology;  Laterality: N/A;  . BALLOON DILATION N/A 08/27/2016   Procedure: BALLOON DILATION;  Surgeon: Manus Gunning, MD;  Location: [facility] ENDOS[facility];   Service: Gastroenterology;  Laterality: N/A;  . CARDIAC CATHETERIZATION    . CARDIAC CATHETERIZATION N/A 08/17/2015   Procedure: Left Heart Cath and Coronary Angiography;  Surgeon: Larey Dresser, MD;  Location: [place] CV LAB;  Service: Cardiovascular;  Laterality: N/A;  . COLONOSCOPY W/ POLYPECTOMY  06/26/2016  . CORONARY STENT PLACEMENT     x 2  . EP IMPLANTABLE DEVICE N/A 03/09/2015   Procedure: Loop Recorder Removal;  Surgeon: [name], MD;  Location: [place] CV LAB;  Service: Cardiovascular;  Laterality: N/A;  . ESOPHAGOGASTRODUODENOSCOPY N/A 07/31/2016   Procedure: ESOPHAGOGASTRODUODENOSCOPY (EGD) also have pediatric scope available;  Surgeon: Manus Gunning, MD;  Location: [facility] END[facility];  Service: Gastroenterology;  Laterality: N/A;  . ESOPHAGOGASTRODUODENOSCOPY N/A 08/27/2016   Procedure: ESOPHAGOGASTRODUODENOSCOPY (EGD);  Surgeon: Manus Gunning, MD;  Location: [facility];  Service: Gastroenterology;  Laterality: N/A;  . implantable loop recorder implantation  08/13/2010   MDT Reveal XT implanted by Dr Roxan Hockey for afib management post ablation  . LEFT HEART CATH N/A 07/15/2013   Procedure: LEFT HEART CATH;  Surgeon: Clent Demark, MD;  Location: [facility] CATH LAB;  Service: Cardiovascular;  Laterality: N/A;  . loop recorder removed  2017  . RADIOACTIVE SEED IMPLANT N/A 11/27/2017   Procedure: RADIOACTIVE SEED IMPLANT/BRACHYTHERAPY IMPLANT;  Surgeon: Kathie Rhodes, MD;  Location[facility];  Service: Urology;  Laterality: N/A;  . SPACE OAR INSTILLATION N/A 11/27/2017   Procedure: SPACE OAR INSTILLATION;  Surgeon: Kathie Rhodes, MD;  Location: [facility];  Service: Urology;  Laterality: N/A;  . TONSILLECTOMY  age 40    Allergies  Allergen Reactions  . Clopidogrel Other (See Comments)    unknown reaction  . Crestor [Rosuvastatin Calcium] Other (See Comments)    Extreme muscular weakness  . Lipitor [Atorvastatin Calcium]  Other (See Comments)    Extreme muscular weakness   . Statins Other (See Comments)    Extreme muscular weakness     Current Outpatient Medications  Medication Sig Dispense Refill  . acetaminophen (TYLENOL) 325 MG tablet Take 325 mg by mouth every 6 (six) hours as needed (FOR PAIN.).    Marland Kitchen Alirocumab (PRALUENT) 75 MG/ML SOAJ Inject 75 mg into the skin every 14 (fourteen) days. 18 mL 1  . Cholecalciferol (SM VITAMIN D3) 4000 units CAPS Take 4,000 Units by mouth daily.    Marland Kitchen HYDROcodone-acetaminophen (NORCO) 10-325 MG tablet Take 1-2 tablets by mouth every 4 (four) hours as needed for moderate pain. Maximum dose  per 24 hours - 8 pills 8 tablet 0  . ketoconazole (NIZORAL) 2 % cream Apply 1 application topically daily as needed for irritation. 15 g 1  . Melatonin 3 MG TABS Take 3 mg by mouth at bedtime.    . Naftifine HCl (NAFTIN) 2 % GEL Apply to affected area daily as directed 45 g 2  . tamsulosin (FLOMAX) 0.4 MG CAPS capsule Take 0.4 mg by mouth daily after supper.    Alveda Reasons 20 MG TABS tablet TAKE 1 TABLET DAILY WITH SUPPER 90 tablet 1   No current facility-administered medications for this visit.     Family History  Problem Relation Age of Onset  . Dementia Mother   . Colon cancer Mother   . Prostate cancer Mother   . Heart disease Father   . Hyperlipidemia Father   . Colon cancer Maternal Grandmother   . Pancreatic cancer Sister     Social History   Socioeconomic History  . Marital status: Married    Spouse name: Not on file  . Number of children: 1  . Years of education: Not on file  . Highest education level: Not on file  Occupational History  . Occupation: retired  Scientific laboratory technician  . Financial resource strain: Not on file  . Food insecurity:    Worry: Not on file    Inability: Not on file  . Transportation needs:    Medical: Not on file    Non-medical: Not on file  Tobacco Use  . Smoking status: Former Smoker    Packs/day: 0.25    Years: 30.00    Pack years:  7.50    Types: Cigarettes    Last attempt to quit: 07/21/2003    Years since quitting: 14.6  . Smokeless tobacco: Never Used  Substance and Sexual Activity  . Alcohol use: Yes    Alcohol/week: 1.0 standard drinks    Types: 1 Glasses of wine per week    Comment: occasional wine or beer  . Drug use: No    Comment: marijuana use in past several yrs ago  . Sexual activity: Yes  Lifestyle  . Physical activity:    Days per week: Not on file    Minutes per session: Not on file  . Stress: Not on file  Relationships  . Social connections:    Talks on phone: Not on file    Gets together: Not on file    Attends religious service: Not on file    Active member of club or organization: Not on file    Attends meetings of clubs or organizations: Not on file    Relationship status: Not on file  . Intimate partner violence:    Fear of current or ex partner: Not on file    Emotionally abused: Not on file    Physically abused: Not on file    Forced sexual activity: Not on file  Other Topics Concern  . Not on file  Social History Narrative   Pt lives in [place] with wife.  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  One son.      REVIEW OF SYSTEMS:  x [X]  denotes positive finding, [ ]  denotes negative finding Cardiac  Comments:  Chest pain or chest pressure:    Shortness of breath upon exertion:    Short of breath when lying flat:    Irregular heart rhythm:        Vascular  Pain in calf, thigh, or hip brought on by ambulation:    Pain in feet at night that wakes you up from your sleep:     Blood clot in your veins:    Leg swelling:         Pulmonary    Oxygen at home:    Productive cough:     Wheezing:  x       Neurologic    Sudden weakness in arms or legs:     Sudden numbness in arms or legs:     Sudden onset of difficulty speaking or slurred speech:    Temporary loss of vision in one eye:     Problems with dizziness:   x       Gastrointestinal    Blood in stool:     Vomited blood:         Genitourinary    Burning when urinating:  x Prostate cancer with seeds implanted  Blood in urine:        Psychiatric    Major depression:         Hematologic    Bleeding problems:    Problems with blood clotting too easily:        Skin    Rashes or ulcers:        Constitutional    Fever or chills:      PHYSICAL EXAMINATION:  Today's Vitals   03/18/18 1021  BP: (!) 157/90  Pulse: (!) 48  Resp: 12  Temp: 98.2 F (36.8 C)  SpO2: 100%  Weight: 171 lb 10.1 oz (77.9 kg)  Height: 5\' 10"  (1.778 m)   Body mass index is 24.63 kg/m.   General:  WDWN in NAD; vital signs documented above Gait: Not observed HENT: WNL, normocephalic Pulmonary: normal non-labored breathing , without Rales, rhonchi,  wheezing Cardiac: regular HR, without  Murmurs; without carotid bruits Abdomen: soft, NT, no masses Skin: without rashes Vascular Exam/Pulses:  Right Left  Radial 2+ (normal) 2+ (normal)  Femoral 2+ (normal) 2+ (normal)  Popliteal Unable to palpate  Unable to palpate   DP Unable to palpate  Unable to palpate   PT 2+ (normal) 2+ (normal)   Extremities: without ischemic changes, without Gangrene , without cellulitis; without open wounds;  Musculoskeletal: no muscle wasting or atrophy  Neurologic: A&O X 3;  No focal weakness or paresthesias are detected Psychiatric:  The pt has Normal affect.   Non-Invasive Vascular Imaging:   AAA duplex and renal artery duplex on 03/18/2018: Abdominal Aorta: There is evidence of abnormal dilitation of the Mid and Distal Abdominal aorta. The largest aortic diameter remains essentially unchanged compared to prior exam. Previous diameter measurement was 3.9 cm obtained on 01/30/17.  Renal:   Right: Normal size right kidney. Normal cortical thickness of right        kidney. No evidence of right renal artery stenosis. RRV flow        present. Second renal artery not  identified. Left:  Normal size of left kidney. Normal cortical thickness of the        left kidney. Evidence of a > 60% stenosis in the left renal        artery. LRV flow present. Kidney size on the low end of        normal.   ASSESSMENT/PLAN:: 73 y.o. male here for follow up for AAA and renal artery stenosis.  -pt doing well and remains asymptomatic.  His duplex today remains relatively unchanged with  maximum diameter of 4cm.  Discussed symptoms of rupture AAA with pt and that he developed these sx, he would need to call 911 and go to [facility] ER.  Discussed with pt that at this time, it is low risk for rupture, but not zero risk.  He expressed understanding.   -Left RAS ~ 60%.  Would not intervene unless he is maxed out on antihypertensives and still uncontrolled and then would consider intervention.   -will have him return in one year with repeat duplex.  He inquired about repeat CT, but discussed with him, with duplex, we can monitor and it decreases his risk of radiation exposure.  If it increases in size, will get CT scan at that point.   -he will call us sooner should he have any issues.    Leontine Locket, PA-C [facility] [phone]  Clinic MD:   Oneida Alar

## 2018-03-18 ENCOUNTER — Ambulatory Visit (INDEPENDENT_AMBULATORY_CARE_PROVIDER_SITE_OTHER)
Admission: RE | Admit: 2018-03-18 | Discharge: 2018-03-18 | Disposition: A | Discharge status: Home / Self Care | Payer: Medicare Other | Source: Ambulatory Visit | Attending: Vascular Surgery | Admitting: Vascular Surgery

## 2018-03-18 ENCOUNTER — Ambulatory Visit: Payer: Medicare Other | Admitting: Physician Assistant

## 2018-03-18 ENCOUNTER — Ambulatory Visit: Payer: Medicare Other | Admitting: Family

## 2018-03-18 ENCOUNTER — Encounter: Payer: Self-pay | Admitting: Physician Assistant

## 2018-03-18 ENCOUNTER — Other Ambulatory Visit (HOSPITAL_COMMUNITY): Payer: Medicare Other

## 2018-03-18 ENCOUNTER — Encounter (HOSPITAL_COMMUNITY): Payer: Medicare Other

## 2018-03-18 ENCOUNTER — Ambulatory Visit (HOSPITAL_COMMUNITY)
Admission: RE | Admit: 2018-03-18 | Discharge: 2018-03-18 | Disposition: A | Discharge status: Home / Self Care | Payer: Medicare Other | Source: Ambulatory Visit | Attending: Vascular Surgery | Admitting: Vascular Surgery

## 2018-03-18 ENCOUNTER — Other Ambulatory Visit: Payer: Self-pay

## 2018-03-18 VITALS — BP 157/90 | HR 48 | Temp 98.2°F | Resp 12 | Ht 70.0 in | Wt 171.6 lb

## 2018-03-18 DIAGNOSIS — I714 Abdominal aortic aneurysm, without rupture, unspecified: Secondary | ICD-10-CM

## 2018-03-18 DIAGNOSIS — G720 Drug-induced myopathy: Secondary | ICD-10-CM

## 2018-03-18 DIAGNOSIS — I701 Atherosclerosis of renal artery: Secondary | ICD-10-CM | POA: Diagnosis present

## 2018-03-18 DIAGNOSIS — T466X5A Adverse effect of antihyperlipidemic and antiarteriosclerotic drugs, initial encounter: Secondary | ICD-10-CM

## 2018-03-31 ENCOUNTER — Ambulatory Visit: Payer: Medicare Other | Admitting: Family Medicine

## 2018-03-31 ENCOUNTER — Other Ambulatory Visit: Payer: Self-pay

## 2018-03-31 ENCOUNTER — Encounter: Payer: Self-pay | Admitting: Family Medicine

## 2018-03-31 VITALS — BP 116/66 | HR 68 | Temp 98.8°F | Ht 70.0 in | Wt 166.0 lb

## 2018-03-31 DIAGNOSIS — R6889 Other general symptoms and signs: Secondary | ICD-10-CM | POA: Diagnosis not present

## 2018-03-31 DIAGNOSIS — J209 Acute bronchitis, unspecified: Secondary | ICD-10-CM

## 2018-03-31 LAB — VERITOR FLU A/B WAIVED
INFLUENZA A: NEGATIVE
INFLUENZA B: NEGATIVE

## 2018-03-31 MED ORDER — AZITHROMYCIN 250 MG PO TABS: ORAL_TABLET | ORAL | 0 refills | Status: DC

## 2018-03-31 NOTE — Patient Instructions (Signed)
The rapid flu test was negative. The patient should drink plenty of fluids take Mucinex twice daily on a regular basis with a large glass of water and take Tylenol for aches pains and fever He should take Z-Pak regularly.  We will call him in a couple of days to see how things are progressing and if he gets worse he should notify us and test for corona virus may be necessary. Drink plenty of fluids The patient should stay at home for the next few days to see how his symptoms progress and call us if he gets worse.

## 2018-03-31 NOTE — Progress Notes (Signed)
Subjective:    Patient ID: Dustin Bauer, male    DOB: 04-16-1945, 73 y.o.   MRN: [id number]  HPI  Patient here today for cough and body aches.  This is been going on for about 1 week.  He does have a somewhat scratchy throat.  He is not sure if he has been running a fever.  Currently he is not running 1 here.  He is taking Mucinex, he has not traveled to any endemic places for coronavirus.  Patient indicates that his wife was sick first and he feels like he caught this from her and she is better.  Patient does have increased risk factors and that he has had seed implants for prostate cancer.  He denies any shortness of breath or high fever.  He has not traveled to any endemic areas.  His throat did not appear red and there was no nodes.  He denies any GI symptoms or trouble passing his water other than associated problems with seed implants.    Patient Active Problem List   Diagnosis Date Noted  . Malignant neoplasm of prostate ([place]) 09/10/2017  . Pyloric stricture   . Nausea and vomiting 05/01/2016  . History of colonic polyps 05/01/2016  . Dysphagia 05/01/2016  . Chronic anticoagulation 05/01/2016  . History of heart block 08/13/2015  . Dyspnea 06/13/2015  . Multiple pulmonary nodules 06/13/2015  . Hypertensive cardiovascular disease 04/17/2014  . CAD (coronary artery disease) 12/16/2013  . History of acute inferior wall myocardial infarction 07/15/2013  . Abnormal chest CT 05/30/2013  . Elevated PSA 05/30/2013  . Hyperlipidemia 06/24/2012  . Paroxysmal atrial fibrillation ([place]) 06/24/2012  . Statin intolerance 06/24/2012   Outpatient Encounter Medications as of 03/31/2018  Medication Sig  . acetaminophen (TYLENOL) 325 MG tablet Take 325 mg by mouth every 6 (six) hours as needed (FOR PAIN.).  Marland Kitchen Alirocumab (PRALUENT) 75 MG/ML SOAJ Inject 75 mg into the skin every 14 (fourteen) days.  . Cholecalciferol (SM VITAMIN D3) 4000 units CAPS Take 4,000 Units by mouth daily.  . Melatonin 3  MG TABS Take 3 mg by mouth at bedtime.  . tamsulosin (FLOMAX) 0.4 MG CAPS capsule Take 0.4 mg by mouth daily after supper.  Alveda Reasons 20 MG TABS tablet TAKE 1 TABLET DAILY WITH SUPPER  . ketoconazole (NIZORAL) 2 % cream Apply 1 application topically daily as needed for irritation. (Patient not taking: Reported on 03/31/2018)  . Naftifine HCl (NAFTIN) 2 % GEL Apply to affected area daily as directed (Patient not taking: Reported on 03/31/2018)  . [DISCONTINUED] HYDROcodone-acetaminophen (NORCO) 10-325 MG tablet Take 1-2 tablets by mouth every 4 (four) hours as needed for moderate pain. Maximum dose per 24 hours - 8 pills   No facility-administered encounter medications on file as of 03/31/2018.      Review of Systems  Constitutional: Positive for fatigue.  HENT: Positive for ear pain and sore throat (scratchy ).   Eyes: Negative.   Respiratory: Positive for cough (dry).   Cardiovascular: Negative.   Gastrointestinal: Negative.   Endocrine: Negative.   Genitourinary: Negative.   Musculoskeletal: Positive for myalgias.  Skin: Negative.   Allergic/Immunologic: Negative.   Neurological: Negative.   Hematological: Negative.   Psychiatric/Behavioral: Negative.        Objective:   Physical Exam Vitals signs and nursing note reviewed.  Constitutional:      General: He is not in acute distress.    Appearance: Normal appearance. He is well-developed and normal weight.  Comments: Patient is pleasant and alert  HENT:     Head: Normocephalic and atraumatic.     Right Ear: Tympanic membrane, ear canal and external ear normal. There is no impacted cerumen.     Left Ear: Tympanic membrane, ear canal and external ear normal. There is no impacted cerumen.     Nose: Nose normal. No congestion.     Mouth/Throat:     Mouth: Mucous membranes are moist.     Pharynx: Oropharynx is clear. No oropharyngeal exudate.  Eyes:     General: No scleral icterus.       Right eye: No discharge.        Left  eye: No discharge.     Conjunctiva/sclera: Conjunctivae normal.     Pupils: Pupils are equal, round, and reactive to light.  Neck:     Musculoskeletal: Normal range of motion and neck supple.     Thyroid: No thyromegaly.     Trachea: No tracheal deviation.  Cardiovascular:     Rate and Rhythm: Normal rate and regular rhythm.     Heart sounds: Normal heart sounds. No murmur.     Comments: The heart is regular at 72/min Pulmonary:     Effort: Pulmonary effort is normal. No respiratory distress.     Breath sounds: Normal breath sounds. No wheezing or rales.     Comments: Coarse bronchial sounds with coughing and no rales or wheezes Abdominal:     General: Abdomen is flat. Bowel sounds are normal.     Palpations: Abdomen is soft. There is no mass.     Tenderness: There is no abdominal tenderness.  Musculoskeletal: Normal range of motion.        General: No tenderness.     Right lower leg: No edema.     Left lower leg: No edema.  Lymphadenopathy:     Cervical: No cervical adenopathy.  Skin:    General: Skin is warm and dry.     Findings: No rash.  Neurological:     General: No focal deficit present.     Mental Status: He is alert and oriented to person, place, and time. Mental status is at baseline.     Cranial Nerves: No cranial nerve deficit.     Deep Tendon Reflexes: Reflexes are normal and symmetric.  Psychiatric:        Mood and Affect: Mood normal.        Behavior: Behavior normal.        Thought Content: Thought content normal.        Judgment: Judgment normal.     Comments: Mood affect and behavior are all normal for this patient    BP 116/66 (BP Location: Left Arm)   Pulse 68   Temp 98.8 F (37.1 C) (Oral)   Ht 5\' 10"  (1.778 m)   Wt 166 lb (75.3 kg)   BMI 23.82 kg/m         Assessment & Plan:  1. Flu-like symptoms -The flu test is negative. - Veritor Flu A/B Waived - CBC with Differential/Platelet  2. Bronchitis with bronchospasm -Patient will take  Z-Pak as directed and continue with Mucinex maximum strength 1 twice daily for cough and congestion along with taking Tylenol for aches pains and fever. -We will plan to call the patient in a couple of days and make sure that he is stable and not getting any worse. -We did inform him to stay in his house and not be out with patients  or people until we see how things progress with his illness.  He understands the importance of doing this. -Patient also understands that if he progresses and gets worse remains a high fever that he should give [place] a call back.  Meds ordered this encounter  Medications  . azithromycin (ZITHROMAX) 250 MG tablet    Sig: As directed    Dispense:  6 tablet    Refill:  0   Patient Instructions  The rapid flu test was negative. The patient should drink plenty of fluids take Mucinex twice daily on a regular basis with a large glass of water and take Tylenol for aches pains and fever He should take Z-Pak regularly.  We will call him in a couple of days to see how things are progressing and if he gets worse he should notify us and test for corona virus may be necessary. Drink plenty of fluids The patient should stay at home for the next few days to see how his symptoms progress and call us if he gets worse.  Arrie Senate MD

## 2018-04-01 LAB — CBC WITH DIFFERENTIAL/PLATELET
Basophils Absolute: 0.1 10*3/uL (ref 0.0–0.2)
Basos: 1 %
EOS (ABSOLUTE): 0.1 10*3/uL (ref 0.0–0.4)
EOS: 1 %
HEMATOCRIT: 42.2 % (ref 37.5–51.0)
HEMOGLOBIN: 14.1 g/dL (ref 13.0–17.7)
IMMATURE GRANS (ABS): 0.1 10*3/uL (ref 0.0–0.1)
Immature Granulocytes: 1 %
LYMPHS ABS: 1.3 10*3/uL (ref 0.7–3.1)
LYMPHS: 13 %
MCH: 31.1 pg (ref 26.6–33.0)
MCHC: 33.4 g/dL (ref 31.5–35.7)
MCV: 93 fL (ref 79–97)
MONOCYTES: 11 %
Monocytes Absolute: 1.1 10*3/uL — ABNORMAL HIGH (ref 0.1–0.9)
NEUTROS ABS: 7.6 10*3/uL — AB (ref 1.4–7.0)
Neutrophils: 73 %
Platelets: 252 10*3/uL (ref 150–450)
RBC: 4.54 x10E6/uL (ref 4.14–5.80)
RDW: 13.2 % (ref 11.6–15.4)
WBC: 10.3 10*3/uL (ref 3.4–10.8)

## 2018-04-09 ENCOUNTER — Telehealth: Payer: Self-pay

## 2018-04-09 NOTE — Telephone Encounter (Signed)
   Primary Cardiologist:  Mertie Moores, MD   Patient contacted.  History reviewed.  No symptoms to suggest any unstable cardiac conditions.  Based on discussion, with current pandemic situation, we will be postponing this appointment for Dustin Bauer with a plan for f/u in 6-12 wks or sooner if feasible/necessary.  If symptoms change, he has been instructed to contact our office.   Routing to C19 CANCEL pool for tracking (P CV DIV CV19 CANCEL - reason for visit "other.") and assigning priority (1 = 4-6 wks, 2 = 6-12 wks, 3 = >12 wks).   Jones Broom, CMA  04/09/2018 2:35 PM         .

## 2018-04-15 ENCOUNTER — Ambulatory Visit: Payer: Medicare Other | Admitting: Cardiovascular Disease

## 2018-04-29 ENCOUNTER — Telehealth: Payer: Self-pay | Admitting: Physician Assistant

## 2018-04-29 NOTE — Telephone Encounter (Signed)
Spoke with patient who confirmed all demographics.  He actively uses My Chart.  He could not try out the webex with me because he was driving during our call. He will have his vitals ready.

## 2018-04-29 NOTE — Telephone Encounter (Signed)
Virtual Visit Pre-Appointment Phone Call  Steps For Call:  1. Confirm consent - "In the setting of the current Covid19 crisis, you are scheduled for a (phone or video) visit with your provider on (date) at (time).  Just as we do with many in-office visits, in order for you to participate in this visit, we must obtain consent.  If you'd like, I can send this to your mychart (if signed up) or email for you to review.  Otherwise, I can obtain your verbal consent now.  All virtual visits are billed to your insurance company just like a normal visit would be.  By agreeing to a virtual visit, we'd like you to understand that the technology does not allow for your provider to perform an examination, and thus may limit your provider's ability to fully assess your condition.  Finally, though the technology is pretty good, we cannot assure that it will always work on either your or our end, and in the setting of a video visit, we may have to convert it to a phone-only visit.  In either situation, we cannot ensure that we have a secure connection.  Are you willing to proceed?" STAFF: Did the patient verbally acknowledge consent to telehealth visit? Document YES/NO here: YES  2. Confirm the BEST phone number to call the day of the visit by including in appointment notes  3. Give patient instructions for WebEx/MyChart download to smartphone as below or Doximity/Doxy.me if video visit (depending on what platform provider is using)  4. Advise patient to be prepared with their blood pressure, heart rate, weight, any heart rhythm information, their current medicines, and a piece of paper and pen handy for any instructions they may receive the day of their visit  5. Inform patient they will receive a phone call 15 minutes prior to their appointment time (may be from unknown caller ID) so they should be prepared to answer  6. Confirm that appointment type is correct in Epic appointment notes (VIDEO vs PHONE)      TELEPHONE CALL NOTE  Dustin Bauer has been deemed a candidate for a follow-up tele-health visit to limit community exposure during the Covid-19 pandemic. I spoke with the patient via phone to ensure availability of phone/video source, confirm preferred email & phone number, and discuss instructions and expectations.  I reminded Dustin Bauer to be prepared with any vital sign and/or heart rhythm information that could potentially be obtained via home monitoring, at the time of his visit. I reminded Dustin Bauer to expect a phone call at the time of his visit if his visit.  Jeanann Lewandowsky, [place] 04/29/2018 2:01 PM   INSTRUCTIONS FOR DOWNLOADING THE [place] APP TO SMARTPHONE  - If Apple, ask patient to go to [employer] and type in WebEx in the search bar. [place] [employer], the blue/green circle. If Android, go to [employer] and type in [employer] in the search bar. The app is free but as with any other app downloads, their phone may require them to verify saved payment information or Apple/Android password.  - The patient does NOT have to create an account. - On the day of the visit, the assist will walk the patient through joining the meeting with the meeting number/password.  INSTRUCTIONS FOR DOWNLOADING THE MYCHART APP TO SMARTPHONE  - The patient must first make sure to have activated MyChart and know their login information - If Apple, go to [employer] and type in  MyChart in the search bar and download the app. If Android, ask patient to go to [employer] and type in [place] in the search bar and download the app. The app is free but as with any other app downloads, their phone may require them to verify saved payment information or Apple/Android password.  - The patient will need to then log into the app with their MyChart username and password, and select [facility] as their healthcare provider to link the account. When it is time for your visit, go to the MyChart app,  find appointments, and click Begin Video Visit. Be sure to Select Allow for your device to access the Microphone and Camera for your visit. You will then be connected, and your provider will be with you shortly.  **If they have any issues connecting, or need assistance please contact MyChart service desk [phone])**  **If using a computer, in order to ensure the best quality for their visit they will need to use either of the following Internet Browsers: [employer], or Google Chrome**  IF USING DOXIMITY or DOXY.ME - The patient will receive a link just prior to their visit, either by text or email (to be determined day of appointment depending on if it's doxy.me or Doximity).     FULL LENGTH CONSENT FOR TELE-HEALTH VISIT   I hereby voluntarily request, consent and authorize [facility] and its employed or contracted physicians, physician assistants, nurse practitioners or other licensed health care professionals (the Practitioner), to provide me with telemedicine health care services (the "Services") as deemed necessary by the treating Practitioner. I acknowledge and consent to receive the Services by the Practitioner via telemedicine. I understand that the telemedicine visit will involve communicating with the Practitioner through live audiovisual communication technology and the disclosure of certain medical information by electronic transmission. I acknowledge that I have been given the opportunity to request an in-person assessment or other available alternative prior to the telemedicine visit and am voluntarily participating in the telemedicine visit.  I understand that I have the right to withhold or withdraw my consent to the use of telemedicine in the course of my care at any time, without affecting my right to future care or treatment, and that the Practitioner or I may terminate the telemedicine visit at any time. I understand that I have the right to inspect all  information obtained and/or recorded in the course of the telemedicine visit and may receive copies of available information for a reasonable fee.  I understand that some of the potential risks of receiving the Services via telemedicine include:  Marland Kitchen Delay or interruption in medical evaluation due to technological equipment failure or disruption; . Information transmitted may not be sufficient (e.g. poor resolution of images) to allow for appropriate medical decision making by the Practitioner; and/or  . In rare instances, security protocols could fail, causing a breach of personal health information.  Furthermore, I acknowledge that it is my responsibility to provide information about my medical history, conditions and care that is complete and accurate to the best of my ability. I acknowledge that Practitioner's advice, recommendations, and/or decision may be based on factors not within their control, such as incomplete or inaccurate data provided by me or distortions of diagnostic images or specimens that may result from electronic transmissions. I understand that the practice of medicine is not an exact science and that Practitioner makes no warranties or guarantees regarding treatment outcomes. I acknowledge that I will receive a copy  of this consent concurrently upon execution via email to the email address I last provided but may also request a printed copy by calling the office of [facility].    I understand that my insurance will be billed for this visit.   I have read or had this consent read to me. . I understand the contents of this consent, which adequately explains the benefits and risks of the Services being provided via telemedicine.  . I have been provided ample opportunity to ask questions regarding this consent and the Services and have had my questions answered to my satisfaction. . I give my informed consent for the services to be provided through the use of telemedicine in my  medical care  By participating in this telemedicine visit I agree to the above.

## 2018-05-04 ENCOUNTER — Other Ambulatory Visit: Payer: Self-pay

## 2018-05-04 ENCOUNTER — Encounter: Payer: Self-pay | Admitting: Physician Assistant

## 2018-05-04 ENCOUNTER — Other Ambulatory Visit: Payer: Self-pay | Admitting: Pharmacist

## 2018-05-04 ENCOUNTER — Telehealth (INDEPENDENT_AMBULATORY_CARE_PROVIDER_SITE_OTHER): Payer: Medicare Other | Admitting: Physician Assistant

## 2018-05-04 VITALS — BP 137/72 | HR 54 | Ht 70.0 in | Wt 164.0 lb

## 2018-05-04 DIAGNOSIS — Z7189 Other specified counseling: Secondary | ICD-10-CM

## 2018-05-04 DIAGNOSIS — E7800 Pure hypercholesterolemia, unspecified: Secondary | ICD-10-CM

## 2018-05-04 DIAGNOSIS — I714 Abdominal aortic aneurysm, without rupture, unspecified: Secondary | ICD-10-CM

## 2018-05-04 DIAGNOSIS — I480 Paroxysmal atrial fibrillation: Secondary | ICD-10-CM

## 2018-05-04 DIAGNOSIS — I2510 Atherosclerotic heart disease of native coronary artery without angina pectoris: Secondary | ICD-10-CM

## 2018-05-04 DIAGNOSIS — Z789 Other specified health status: Secondary | ICD-10-CM

## 2018-05-04 MED ORDER — ALIROCUMAB 75 MG/ML ~~LOC~~ SOAJ: 75.0000 mg | SUBCUTANEOUS | 11 refills | Status: DC

## 2018-05-04 NOTE — Progress Notes (Signed)
Virtual Visit via Telephone Note   This visit type was conducted due to national recommendations for restrictions regarding the COVID-19 Pandemic (e.g. social distancing) in an effort to limit this patient's exposure and mitigate transmission in our community.  Due to his co-morbid illnesses, this patient is at least at moderate risk for complications without adequate follow up.  This format is felt to be most appropriate for this patient at this time.  The patient did not have access to video technology/had technical difficulties with video requiring transitioning to audio format only (telephone).  All issues noted in this document were discussed and addressed.  No physical exam could be performed with this format.  Please refer to the patient's chart for his  consent to telehealth for [facility].   Evaluation Performed:  Follow-up visit  Date:  05/04/2018   ID:  Dustin Bauer, DOB 10-01-1945, MRN [id number]  Patient Location: Home Provider Location: Home  PCP:  Chipper Herb, MD  Cardiologist:  Mertie Moores, MD, Never seen, Previous patient of Dr Aundra Dubin (will be followed by Dr. Margaretann Loveless) EP: Dr. Rayann Heman  Chief Complaint:  10 Months follow up  History of Present Illness:    Dustin Bauer is a 73 y.o. male with hx of paroxysmal atrial fibrillation s/p ablation at [facility], PCI to LAD 2011, inferoposterior STEMI 2015 with DES to totally occluded RCA with EF  68-34% complicated by complete heart block which subsequently resolved without PPM seen for follow up.  LHC 08/2015 with patent LAD and RCA stents. HR in the 40s. Heart monitor negative for bradycardia but showed Afib about 10% of the time. He is now anticoagulated.  ETT 10/2015  Blood pressure demonstrated a normal response to exercise.  Non diagnostic for ischemia due to artifact and baseline ECG changes  No chronotropic incompetence with maximum HR achieved 134 bpm 89% PMHR  Last seen in clinic by APP 05/2017.  The patient does  not have symptoms concerning for COVID-19 infection (fever, chills, cough, or new shortness of breath).   Patient has not been able to go [employer] due to COVID-19 restriction.  However, he is walking 1 to 2 miles each day.  For the past 4 to 6 weeks he has noted exertional shortness of breath, especially walking uphill.  Relieved with rest.  No exertional chest pain/pressure.  He never had chest pain with prior angina.  He denies orthopnea, PND, syncope, lower extremity edema or melena.  Past Medical History:  Diagnosis Date  . AAA (abdominal aortic aneurysm) ([place]) 01/20/2017   3.9cm infrarenal  . Allergy to ACE inhibitors    Cough and fatigue with ACEI  . Aortic atherosclerosis ([place]) 08/18/2017   Noted on CT chest  . Bigeminy 10/23/2017   Noted on ECG  . CAD (coronary artery disease) stent x 1 2011 and 1  2014   a. LHC in 2011 for stable angina >> DES to pLAD [facility])  //  b. inf-post STEMI 7/15 c/b CHB with jxn escape >> LHC: OM1 60-70, pLAD 30-40, m-d RCA occluded, EF 50-55 >> PCI: 3 x 33 mm Xience DES to RCA  //  c. LHC 8/17: dLM 20, pLAD 20, pLAD stent ok, pRI 30, pRCA stent ok with 30% ISR  . Colon polyps   . Complication of anesthesia    no memories from right after last colonscopy/endoscopy may 2018, did not last long  . Diverticulosis   . Emphysema lung ([place]) 08/18/2017   Noted on CT chest  .  First degree AV block 10/23/2017   Noted on EKG  . Gastric stenosis 08/27/2016   at pylorus, noted on endoscopy  . GERD (gastroesophageal reflux disease)   . History of echocardiogram    Echo 12/15:  Mild LVH, EF 60-65%, normal wall motion, grade 1 diastolic dysfunction, LA upper limits of normal, mild RAE  . Hydrocele, bilateral 01/30/2017   Noted on CT chest  . Hydrocele, left   . Hyperlipidemia    Myalgias with Zocor, Crestor, atorvastatin.   Marland Kitchen Hyperplasia of prostate   . Myocardial infarction ([place]) 2014  . Nicotine abuse   . PAF (paroxysmal atrial fibrillation) [facility])    s/p  ablation by Dr Roxan Hockey at [facility] in 2012 // Xarelto anticoagulation  //  ILR removed 2/17 // Event Monitor 11/17: Predominantly NSR (90%). Paroxysmal atrial fibrillation (10%).  Atrial fibrillation appears to be rate-controlled when it occurs.   . Prostate cancer ([place]) dx 2019  . Pulmonary nodule, right 08/18/2017   6 mm right upper lobe, 5.5 mm right middle lobe, Noted on CT Chest  . Pyloric stenosis   . Thoracic ascending aortic aneurysm ([place]) 01/30/2017   3.9 cm   Past Surgical History:  Procedure Laterality Date  . ATRIAL FIBRILLATION ABLATION  07/2010   Afib ablation by Dr Roxan Hockey at [facility] in 2012  . BALLOON DILATION N/A 07/31/2016   Procedure: BALLOON DILATION;  Surgeon: Manus Gunning, MD;  Location: [facility];  Service: Gastroenterology;  Laterality: N/A;  . BALLOON DILATION N/A 08/27/2016   Procedure: BALLOON DILATION;  Surgeon: Manus Gunning, MD;  Location: [facility];  Service: Gastroenterology;  Laterality: N/A;  . CARDIAC CATHETERIZATION    . CARDIAC CATHETERIZATION N/A 08/17/2015   Procedure: Left Heart Cath and Coronary Angiography;  Surgeon: Larey Dresser, MD;  Location: [facility];  Service: Cardiovascular;  Laterality: N/A;  . COLONOSCOPY W/ POLYPECTOMY  06/26/2016  . CORONARY STENT PLACEMENT     x 2  . EP IMPLANTABLE DEVICE N/A 03/09/2015   Procedure: Loop Recorder Removal;  Surgeon: [name], MD;  Location: [facility];  Service: Cardiovascular;  Laterality: N/A;  . ESOPHAGOGASTRODUODENOSCOPY N/A 07/31/2016   Procedure: ESOPHAGOGASTRODUODENOSCOPY (EGD) also have pediatric scope available;  Surgeon: Manus Gunning, MD;  Location: [facility];  Service: Gastroenterology;  Laterality: N/A;  . ESOPHAGOGASTRODUODENOSCOPY N/A 08/27/2016   Procedure: ESOPHAGOGASTRODUODENOSCOPY (EGD);  Surgeon: Manus Gunning, MD;  Location: [facility];  Service: Gastroenterology;  Laterality: N/A;  . implantable loop recorder  implantation  08/13/2010   MDT Reveal XT implanted by Dr Roxan Hockey for afib management post ablation  . LEFT HEART CATH N/A 07/15/2013   Procedure: LEFT HEART CATH;  Surgeon: Clent Demark, MD;  Location: [facility] CATH LAB;  Service: Cardiovascular;  Laterality: N/A;  . loop recorder removed  2017  . RADIOACTIVE SEED IMPLANT N/A 11/27/2017   Procedure: RADIOACTIVE SEED IMPLANT/BRACHYTHERAPY IMPLANT;  Surgeon: Kathie Rhodes, MD;  Location: [facility];  Service: Urology;  Laterality: N/A;  . SPACE OAR INSTILLATION N/A 11/27/2017   Procedure: SPACE OAR INSTILLATION;  Surgeon: Kathie Rhodes, MD;  Location: [facility];  Service: Urology;  Laterality: N/A;  . TONSILLECTOMY  age 35     Current Meds  Medication Sig  . acetaminophen (TYLENOL) 325 MG tablet Take 325 mg by mouth every 6 (six) hours as needed (FOR PAIN.).  Marland Kitchen Alirocumab (PRALUENT) 75 MG/ML SOAJ Inject 75 mg into the skin every 14 (fourteen) days.  Marland Kitchen  Cholecalciferol (SM VITAMIN D3) 4000 units CAPS Take 4,000 Units by mouth daily.  . Melatonin 3 MG TABS Take 3 mg by mouth at bedtime.  Alveda Reasons 20 MG TABS tablet TAKE 1 TABLET DAILY WITH SUPPER     Allergies:   Clopidogrel; Crestor [rosuvastatin calcium]; Lipitor [atorvastatin calcium]; and Statins   Social History   Tobacco Use  . Smoking status: Former Smoker    Packs/day: 0.25    Years: 30.00    Pack years: 7.50    Types: Cigarettes    Last attempt to quit: 07/21/2003    Years since quitting: 14.7  . Smokeless tobacco: Never Used  Substance Use Topics  . Alcohol use: Yes    Alcohol/week: 1.0 standard drinks    Types: 1 Glasses of wine per week    Comment: occasional wine or beer  . Drug use: No    Comment: marijuana use in past several yrs ago     Family Hx: The patient's family history includes Colon cancer in his maternal grandmother and mother; Dementia in his mother; Heart disease in his father; Hyperlipidemia in his father; Pancreatic  cancer in his sister; Prostate cancer in his mother.  ROS:   Please see the history of present illness.    All other systems reviewed and are negative.   Prior CV studies:   The following studies were reviewed today:  As above  Labs/Other Tests and Data Reviewed:    EKG:  No ECG reviewed.  Recent Labs: 06/09/2017: TSH 2.620 12/25/2017: ALT 14; BUN 15; Creatinine, Ser 0.96; Potassium 4.6; Sodium 143 03/31/2018: Hemoglobin 14.1; Platelets 252   Recent Lipid Panel Lab Results  Component Value Date/Time   CHOL 121 12/25/2017 12:53 PM   CHOL 100 (L) 12/28/2014 04:09 PM   TRIG 71 12/25/2017 12:53 PM   TRIG 95 06/04/2015 03:01 PM   HDL 54 12/25/2017 12:53 PM   HDL 55 06/04/2015 03:01 PM   CHOLHDL 2.2 12/25/2017 12:53 PM   CHOLHDL 2.2 11/27/2015 04:36 PM   LDLCALC 53 12/25/2017 12:53 PM   LDLCALC 33 12/28/2014 04:09 PM    Wt Readings from Last 3 Encounters:  05/04/18 164 lb (74.4 kg)  03/31/18 166 lb (75.3 kg)  03/18/18 171 lb 10.1 oz (77.9 kg)     Objective:    Vital Signs:  BP 137/72   Pulse (!) 54   Ht 5\' 10"  (1.778 m)   Wt 164 lb (74.4 kg)   BMI 23.53 kg/m    VITAL SIGNS:  reviewed GEN:  no acute distress NEURO:  alert and oriented x 3, no obvious focal deficit PSYCH:  normal affect  ASSESSMENT & PLAN:   1.  Dyspnea on exertion -No exertional chest pain/pressure.  He never had chest pain prior to ischemic work-up.  His dyspnea could be anginal equivalent however patient wants to have stress test once COVID-19 restriction expires.  I agree with his plan.  His symptoms very mild and resolved with rest.  He will give [place] a call if worsening of symptoms.  2. CAD  -As above.  Not on aspirin due to need of anticoagulation.  3. PAF -Continue Xarelto.  No bleeding issue.  Just easy bruising.  3. HLD -12/25/2017: Cholesterol, Total 121; HDL 54; LDL Calculated 53; Triglycerides 71  - Hx of statin intolerance -Continue PRALUENT  4. AAA - Followed by vascular   COVID-19 Education: The signs and symptoms of COVID-19 were discussed with the patient and how to seek care for  testing (follow up with PCP or arrange E-visit). The importance of social distancing was discussed today.  Time:   Today, I have spent 10-15 minutes with the patient with telehealth technology discussing the above problems.     Medication Adjustments/Labs and Tests Ordered: Current medicines are reviewed at length with the patient today.  Concerns regarding medicines are outlined above.   Tests Ordered: No orders of the defined types were placed in this encounter.   Medication Changes: No orders of the defined types were placed in this encounter.   Disposition:  Follow up in 4 month(s)  Signed, Leanor Kail, PA  05/04/2018 3:48 PM    [facility]

## 2018-05-04 NOTE — Patient Instructions (Signed)
Medication Instructions:  Your physician recommends that you continue on your current medications as directed. Please refer to the Current Medication list given to you today.  If you need a refill on your cardiac medications before your next appointment, please call your pharmacy.   Lab work: None ordered  If you have labs (blood work) drawn today and your tests are completely normal, you will receive your results only by: Marland Kitchen MyChart Message (if you have MyChart) OR . A paper copy in the mail If you have any lab test that is abnormal or we need to change your treatment, we will call you to review the results.  Testing/Procedures: None ordered   Follow-Up: At [facility], you and your health needs are our priority.  As part of our continuing mission to provide you with exceptional heart care, we have created designated Provider Care Teams.  These Care Teams include your primary Cardiologist (physician) and Advanced Practice Providers (APPs -  Physician Assistants and Nurse Practitioners) who all work together to provide you with the care you need, when you need it. You will need a follow up appointment in 4 months.  Please call our office 2 months in advance to schedule this appointment.  You may see Dr. Margaretann Loveless or one of the following Advanced Practice Providers on your designated Care Team:   Rosaria Ferries, PA-C . Jory Sims, DNP, ANP  Any Other Special Instructions Will Be Listed Below (If Applicable).

## 2018-05-05 ENCOUNTER — Telehealth: Payer: Self-pay | Admitting: Cardiovascular Disease

## 2018-05-05 MED ORDER — ALIROCUMAB 75 MG/ML ~~LOC~~ SOAJ: 75.0000 mg | SUBCUTANEOUS | 11 refills | Status: DC

## 2018-05-05 NOTE — Telephone Encounter (Signed)
Noted. Refill was sent to [facility] yesterday per pt request relayed to [facility]. Have sent additional rx to [facility] in case it's needed.

## 2018-05-05 NOTE — Telephone Encounter (Signed)
Per pt call - stated  PRALUENT 75mg  script was [facility] to [facility] .... he does not need that one ... he sent it to [facility].   Pt request that this office let pharm know it is not needed from them at this time.  If any questions please give pt a call.

## 2018-06-25 ENCOUNTER — Ambulatory Visit (INDEPENDENT_AMBULATORY_CARE_PROVIDER_SITE_OTHER): Payer: Medicare Other | Admitting: Family Medicine

## 2018-06-25 ENCOUNTER — Ambulatory Visit: Payer: Medicare Other | Admitting: Family Medicine

## 2018-06-25 ENCOUNTER — Encounter: Payer: Self-pay | Admitting: Family Medicine

## 2018-06-25 DIAGNOSIS — E7800 Pure hypercholesterolemia, unspecified: Secondary | ICD-10-CM | POA: Diagnosis not present

## 2018-06-25 DIAGNOSIS — R918 Other nonspecific abnormal finding of lung field: Secondary | ICD-10-CM

## 2018-06-25 DIAGNOSIS — I714 Abdominal aortic aneurysm, without rupture, unspecified: Secondary | ICD-10-CM

## 2018-06-25 DIAGNOSIS — I2510 Atherosclerotic heart disease of native coronary artery without angina pectoris: Secondary | ICD-10-CM | POA: Diagnosis not present

## 2018-06-25 DIAGNOSIS — Z800 Family history of malignant neoplasm of digestive organs: Secondary | ICD-10-CM

## 2018-06-25 DIAGNOSIS — E559 Vitamin D deficiency, unspecified: Secondary | ICD-10-CM

## 2018-06-25 DIAGNOSIS — I10 Essential (primary) hypertension: Secondary | ICD-10-CM | POA: Diagnosis not present

## 2018-06-25 DIAGNOSIS — I480 Paroxysmal atrial fibrillation: Secondary | ICD-10-CM

## 2018-06-25 DIAGNOSIS — C61 Malignant neoplasm of prostate: Secondary | ICD-10-CM

## 2018-06-25 DIAGNOSIS — I252 Old myocardial infarction: Secondary | ICD-10-CM | POA: Diagnosis not present

## 2018-06-25 DIAGNOSIS — N433 Hydrocele, unspecified: Secondary | ICD-10-CM

## 2018-06-25 NOTE — Patient Instructions (Signed)
Follow-up with all specialists as planned. Dr. Karsten Ro, Dr. Trula Slade, Dr. Lake Bells, Dr. Havery Moros, and Dr. Katy Fitch, along with new cardiologist. Continue to practice good hand and respiratory hygiene Continue to follow-up with all the specialists and follow through with test that they request Drink plenty of water and fluids and stay well-hydrated Make appointment to see PCP in about 6 months.

## 2018-06-25 NOTE — Addendum Note (Signed)
Addended by: Zannie Cove on: 06/25/2018 03:40 PM   Modules accepted: Orders

## 2018-06-25 NOTE — Progress Notes (Signed)
Virtual Visit Via telephone Note I connected with@ on 06/25/18 by telephone and verified that I am speaking with the correct person or authorized healthcare agent using two identifiers. Dustin Bauer is currently located at home and there are no unauthorized people in close proximity. I completed this visit while in a private location in my home .  This visit type was conducted due to national recommendations for restrictions regarding the COVID-19 Pandemic (e.g. social distancing).  This format is felt to be most appropriate for this patient at this time.  All issues noted in this document were discussed and addressed.  No physical exam was performed.    I discussed the limitations, risks, security and privacy concerns of performing an evaluation and management service by telephone and the availability of in person appointments. I also discussed with the patient that there may be a patient responsible charge related to this service. The patient expressed understanding and agreed to proceed.   Date:  06/25/2018    ID:  [name]      [birth year]        [id number]   Patient Care Team Patient Care Team: Chipper Herb, MD as PCP - General (Family Medicine) Nahser, Wonda Cheng, MD as PCP - Cardiology (Cardiology)  Reason for Visit: Primary Care Follow-up     History of Present Illness & Review of Systems:     Dustin Bauer is a 73 y.o. year old male primary care patient that presents today for a telehealth visit.  The patient is pleasant calm and doing well.  He denies any chest pain pressure tightness or shortness of breath.  He denies any trouble with swallowing heartburn indigestion nausea vomiting diarrhea blood and the stool or black tarry bowel movements.  The problems he was having with his stomach previously seems to have resolved with the stretching of his esophagus or stricture that he had.  This was by Dr. Havery Moros.  He does have a history of colon cancer in his mother and  understands a he will need another colonoscopy 5 years from the previous colonoscopy or sooner if he has problems.  He understands to contact Dr. Havery Moros if he has any issues.  He is passing his water well and has been very pleased with the seed implants from Dr. Tammi Klippel and is continue to be followed by Dr. Karsten Ro.  Dustin Bauer is somewhat complicated because he sees so many different specialist for so many different problems.  He sees Dr. Karsten Ro for his prostate cancer he sees or will see a new cardiologist that joined the group for his heart.  He sees Dr. Gigi Gin for his vascular issues especially regarding the abdominal aortic aneurysm.  He sees Dr. Lake Bells for his pulmonary nodules.  Dr. Havery Moros for his stomach issues and polyps and follow-up for colon cancer.  He sees Dr. Tammi Klippel if needed for radiation oncology because of the seeds in the prostate.  He also sees Dr. Katy Fitch for his eye exams and that is an appointment that is coming up next Wednesday.  He does complain of occasional fatigue and cannot correlate that with anything in particular.  He cannot correlate that with any kind of heart irregularity.  I told him to make attempts a correlation to see if there is something else that needed to be checked out.  He would like to get an EKG when he comes to the office to get his blood work and we will plan  to do that.  He is also needing to get a chest CT in August and the pulmonologist most with likely will see it at that gets done for him.  Review of systems as stated, otherwise negative.  The patient does not have symptoms concerning for COVID-19 infection (fever, chills, cough, or new shortness of breath).      Current Medications (Verified) Allergies as of 06/25/2018      Reactions   Clopidogrel Other (See Comments)   unknown reaction   Crestor [rosuvastatin Calcium] Other (See Comments)   Extreme muscular weakness   Lipitor [atorvastatin Calcium] Other (See Comments)   Extreme muscular  weakness   Statins Other (See Comments)   Extreme muscular weakness      Medication List       Accurate as of June 25, 2018 11:26 AM. If you have any questions, ask your nurse or doctor.        acetaminophen 325 MG tablet Commonly known as: TYLENOL Take 325 mg by mouth every 6 (six) hours as needed (FOR PAIN.).   Alirocumab 75 MG/ML Soaj Commonly known as: Praluent Inject 75 mg into the skin every 14 (fourteen) days.   Melatonin 3 MG Tabs Take 3 mg by mouth at bedtime.   SM Vitamin D3 100 MCG (4000 UT) Caps Generic drug: Cholecalciferol Take 4,000 Units by mouth daily.   Xarelto 20 MG Tabs tablet Generic drug: rivaroxaban TAKE 1 TABLET DAILY WITH SUPPER           Allergies (Verified)    Clopidogrel, Crestor [rosuvastatin calcium], Lipitor [atorvastatin calcium], and Statins  Past Medical History Past Medical History:  Diagnosis Date  . AAA (abdominal aortic aneurysm) ([place]) 01/20/2017   3.9cm infrarenal  . Allergy to ACE inhibitors    Cough and fatigue with ACEI  . Aortic atherosclerosis ([place]) 08/18/2017   Noted on CT chest  . Bigeminy 10/23/2017   Noted on ECG  . CAD (coronary artery disease) stent x 1 2011 and 1  2014   a. LHC in 2011 for stable angina >> DES to pLAD [facility])  //  b. inf-post STEMI 7/15 c/b CHB with jxn escape >> LHC: OM1 60-70, pLAD 30-40, m-d RCA occluded, EF 50-55 >> PCI: 3 x 33 mm Xience DES to RCA  //  c. LHC 8/17: dLM 20, pLAD 20, pLAD stent ok, pRI 30, pRCA stent ok with 30% ISR  . Colon polyps   . Complication of anesthesia    no memories from right after last colonscopy/endoscopy may 2018, did not last long  . Diverticulosis   . Emphysema lung ([place]) 08/18/2017   Noted on CT chest  . First degree AV block 10/23/2017   Noted on EKG  . Gastric stenosis 08/27/2016   at pylorus, noted on endoscopy  . GERD (gastroesophageal reflux disease)   . History of echocardiogram    Echo 12/15:  Mild LVH, EF 60-65%, normal wall motion, grade 1  diastolic dysfunction, LA upper limits of normal, mild RAE  . Hydrocele, bilateral 01/30/2017   Noted on CT chest  . Hydrocele, left   . Hyperlipidemia    Myalgias with Zocor, Crestor, atorvastatin.   Marland Kitchen Hyperplasia of prostate   . Myocardial infarction ([place]) 2014  . Nicotine abuse   . PAF (paroxysmal atrial fibrillation) [facility])    s/p ablation by Dr Roxan Hockey at [facility] in 2012 // Xarelto anticoagulation  //  ILR removed 2/17 // Event Monitor 11/17: Predominantly NSR (90%). Paroxysmal atrial fibrillation (10%).  Atrial fibrillation appears to be rate-controlled when it occurs.   . Prostate cancer ([place]) dx 2019  . Pulmonary nodule, right 08/18/2017   6 mm right upper lobe, 5.5 mm right middle lobe, Noted on CT Chest  . Pyloric stenosis   . Thoracic ascending aortic aneurysm ([place]) 01/30/2017   3.9 cm     Past Surgical History:  Procedure Laterality Date  . ATRIAL FIBRILLATION ABLATION  07/2010   Afib ablation by Dr Roxan Hockey at [facility] in 2012  . BALLOON DILATION N/A 07/31/2016   Procedure: BALLOON DILATION;  Surgeon: Manus Gunning, MD;  Location: [facility];  Service: Gastroenterology;  Laterality: N/A;  . BALLOON DILATION N/A 08/27/2016   Procedure: BALLOON DILATION;  Surgeon: Manus Gunning, MD;  Location: [facility];  Service: Gastroenterology;  Laterality: N/A;  . CARDIAC CATHETERIZATION    . CARDIAC CATHETERIZATION N/A 08/17/2015   Procedure: Left Heart Cath and Coronary Angiography;  Surgeon: Larey Dresser, MD;  Location: [place] CV LAB;  Service: Cardiovascular;  Laterality: N/A;  . COLONOSCOPY W/ POLYPECTOMY  06/26/2016  . CORONARY STENT PLACEMENT     x 2  . EP IMPLANTABLE DEVICE N/A 03/09/2015   Procedure: Loop Recorder Removal;  Surgeon: [name], MD;  Location: [place] [facility];  Service: Cardiovascular;  Laterality: N/A;  . ESOPHAGOGASTRODUODENOSCOPY N/A 07/31/2016   Procedure: ESOPHAGOGASTRODUODENOSCOPY (EGD) also have pediatric scope  available;  Surgeon: Manus Gunning, MD;  Location: [facility] END[facility];  Service: Gastroenterology;  Laterality: N/A;  . ESOPHAGOGASTRODUODENOSCOPY N/A 08/27/2016   Procedure: ESOPHAGOGASTRODUODENOSCOPY (EGD);  Surgeon: Manus Gunning, MD;  Location: [facility];  Service: Gastroenterology;  Laterality: N/A;  . implantable loop recorder implantation  08/13/2010   MDT Reveal XT implanted by Dr Roxan Hockey for afib management post ablation  . LEFT HEART CATH N/A 07/15/2013   Procedure: LEFT HEART CATH;  Surgeon: Clent Demark, MD;  Location[facility] C[facility] LAB;  Service: Cardiovascular;  Laterality: N/A;  . loop recorder removed  2017  . RADIOACTIVE SEED IMPLANT N/A 11/27/2017   Procedure: RADIOACTIVE SEED IMPLANT/BRACHYTHERAPY IMPLANT;  Surgeon: Kathie Rhodes, MD;  Location: [facility];  Service: Urology;  Laterality: N/A;  . SPACE OAR INSTILLATION N/A 11/27/2017   Procedure: SPACE OAR INSTILLATION;  Surgeon: Kathie Rhodes, MD;  Location: [facility];  Service: Urology;  Laterality: N/A;  . TONSILLECTOMY  age 34    Social History   Socioeconomic History  . Marital status: Married    Spouse name: Not on file  . Number of children: 1  . Years of education: Not on file  . Highest education level: Not on file  Occupational History  . Occupation: retired  Scientific laboratory technician  . Financial resource strain: Not on file  . Food insecurity    Worry: Not on file    Inability: Not on file  . Transportation needs    Medical: Not on file    Non-medical: Not on file  Tobacco Use  . Smoking status: Former Smoker    Packs/day: 0.25    Years: 30.00    Pack years: 7.50    Types: Cigarettes    Quit date: 07/21/2003    Years since quitting: 14.9  . Smokeless tobacco: Never Used  Substance and Sexual Activity  . Alcohol use: Yes    Alcohol/week: 1.0 standard drinks    Types: 1 Glasses of wine per week    Comment: occasional wine or beer  . Drug use: No  Comment:  marijuana use in past several yrs ago  . Sexual activity: Yes  Lifestyle  . Physical activity    Days per week: Not on file    Minutes per session: Not on file  . Stress: Not on file  Relationships  . Social Herbalist on phone: Not on file    Gets together: Not on file    Attends religious service: Not on file    Active member of club or organization: Not on file    Attends meetings of clubs or organizations: Not on file    Relationship status: Not on file  Other Topics Concern  . Not on file  Social History Narrative   Pt lives in [place] with wife.  Retired from department of transportation.  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  One son.      Family History  Problem Relation Age of Onset  . Dementia Mother   . Colon cancer Mother   . Prostate cancer Mother   . Heart disease Father   . Hyperlipidemia Father   . Colon cancer Maternal Grandmother   . Pancreatic cancer Sister       Labs/Other Tests and Data Reviewed:    Wt Readings from Last 3 Encounters:  05/04/18 164 lb (74.4 kg)  03/31/18 166 lb (75.3 kg)  03/18/18 171 lb 10.1 oz (77.9 kg)   Temp Readings from Last 3 Encounters:  03/31/18 98.8 F (37.1 C) (Oral)  03/18/18 98.2 F (36.8 C)  12/25/17 (!) 96.9 F (36.1 C) (Oral)   BP Readings from Last 3 Encounters:  05/04/18 137/72  03/31/18 116/66  03/18/18 (!) 157/90   Pulse Readings from Last 3 Encounters:  05/04/18 (!) 54  03/31/18 68  03/18/18 (!) 48     No results found for: HGBA1C Lab Results  Component Value Date   LDLCALC 53 12/25/2017   CREATININE 0.96 12/25/2017       Chemistry      Component Value Date/Time   NA 143 12/25/2017 1253   K 4.6 12/25/2017 1253   CL 104 12/25/2017 1253   CO2 25 12/25/2017 1253   BUN 15 12/25/2017 1253   CREATININE 0.96 12/25/2017 1253   CREATININE 1.01 11/27/2015 1636      Component Value Date/Time   CALCIUM 9.4 12/25/2017 1253   ALKPHOS 105 12/25/2017 1253   AST 22  12/25/2017 1253   ALT 14 12/25/2017 1253   BILITOT 0.4 12/25/2017 1253         OBSERVATIONS/ OBJECTIVE:     The patient today was pleasant and alert.  He says his blood pressure usually runs around 130/78.  His pulse rates are in the 50s and generally that is normal for him.  His weight is 165.  He has a monitor that he wears and normally the monitor says it is normal sinus rhythm but sometimes it is not.  Physical exam deferred due to nature of telephonic visit.  ASSESSMENT & PLAN    Time:   Today, I have spent 34 minutes with the patient via telephone discussing the above including Covid precautions.     Visit Diagnoses: 1. Pure hypercholesterolemia -The patient should continue with his Praluent and with as aggressive therapeutic lifestyle changes as possible pending results of lab work to be done.  2. Vitamin D deficiency -Continue with vitamin D replacement pending results of lab work  3. Prostate cancer ([place]) -Continue follow-up with Dr. Karsten Ro for his prostate  cancer  4. Essential hypertension -Continue current medicines for blood pressure and check blood pressure regularly watch salt intake and keep weight down through exercise and diet  5. ASCVD (arteriosclerotic cardiovascular disease) -Follow-up with cardiology as planned and continue with Praluent, get cholesterol done in our office.  6. Multiple pulmonary nodules -Follow-up with Dr. Lake Bells and sometime the end of the summer needs to get repeat CT scan per Dr. Anastasia Pall direction.  7. Paroxysmal atrial fibrillation ([facility]) -Follow-up with cardiology as planned  8. Abdominal aortic aneurysm (AAA) without rupture ([place]) -Follow-up with vascular surgeon Dr. Trula Slade as planned  9. Family history of pancreatic cancer -Follow-up with [facility] as planned  10. Family history of colon cancer -Repeat colonoscopy and endoscopy as planned and as needed.  With family history of colon cancer the next colonoscopy should  be 5 years after the previous one.  11. Hydrocele in adult -Make sure that urologist is aware of this and especially if patient has any problems with the hydrocele.  Patient Instructions  Follow-up with all specialists as planned. Dr. Karsten Ro, Dr. Trula Slade, Dr. Lake Bells, Dr. Havery Moros, and Dr. Katy Fitch, along with new cardiologist. Continue to practice good hand and respiratory hygiene Continue to follow-up with all the specialists and follow through with test that they request Drink plenty of water and fluids and stay well-hydrated Make appointment to see PCP in about 6 months.        The above assessment and management plan was discussed with the patient. The patient verbalized understanding of and has agreed to the management plan. Patient is aware to call the clinic if symptoms persist or worsen. Patient is aware when to return to the clinic for a follow-up visit. Patient educated on when it is appropriate to go to the emergency department.    Chipper Herb, MD [address], [place], [place] [address] Ph [phone]   Arrie Senate MD

## 2018-07-01 ENCOUNTER — Telehealth: Payer: Self-pay | Admitting: Family Medicine

## 2018-07-05 NOTE — Telephone Encounter (Signed)
Pt called - time set for EKG and labs

## 2018-07-06 ENCOUNTER — Other Ambulatory Visit: Payer: Self-pay

## 2018-07-07 ENCOUNTER — Other Ambulatory Visit: Payer: Medicare Other

## 2018-07-07 ENCOUNTER — Ambulatory Visit (INDEPENDENT_AMBULATORY_CARE_PROVIDER_SITE_OTHER): Payer: Medicare Other | Admitting: *Deleted

## 2018-07-07 DIAGNOSIS — Z800 Family history of malignant neoplasm of digestive organs: Secondary | ICD-10-CM

## 2018-07-07 DIAGNOSIS — C61 Malignant neoplasm of prostate: Secondary | ICD-10-CM

## 2018-07-07 DIAGNOSIS — I480 Paroxysmal atrial fibrillation: Secondary | ICD-10-CM

## 2018-07-07 DIAGNOSIS — R918 Other nonspecific abnormal finding of lung field: Secondary | ICD-10-CM

## 2018-07-07 DIAGNOSIS — I714 Abdominal aortic aneurysm, without rupture, unspecified: Secondary | ICD-10-CM

## 2018-07-07 DIAGNOSIS — I252 Old myocardial infarction: Secondary | ICD-10-CM

## 2018-07-07 DIAGNOSIS — I10 Essential (primary) hypertension: Secondary | ICD-10-CM

## 2018-07-07 DIAGNOSIS — N433 Hydrocele, unspecified: Secondary | ICD-10-CM

## 2018-07-07 DIAGNOSIS — E7800 Pure hypercholesterolemia, unspecified: Secondary | ICD-10-CM

## 2018-07-07 DIAGNOSIS — E559 Vitamin D deficiency, unspecified: Secondary | ICD-10-CM

## 2018-07-07 DIAGNOSIS — I2510 Atherosclerotic heart disease of native coronary artery without angina pectoris: Secondary | ICD-10-CM

## 2018-07-07 LAB — LIPID PANEL

## 2018-07-08 LAB — CBC WITH DIFFERENTIAL/PLATELET
Basophils Absolute: 0.1 10*3/uL (ref 0.0–0.2)
Basos: 1 %
EOS (ABSOLUTE): 0.4 10*3/uL (ref 0.0–0.4)
Eos: 5 %
Hematocrit: 44 % (ref 37.5–51.0)
Hemoglobin: 14.4 g/dL (ref 13.0–17.7)
Immature Grans (Abs): 0 10*3/uL (ref 0.0–0.1)
Immature Granulocytes: 0 %
Lymphocytes Absolute: 2.1 10*3/uL (ref 0.7–3.1)
Lymphs: 32 %
MCH: 31.1 pg (ref 26.6–33.0)
MCHC: 32.7 g/dL (ref 31.5–35.7)
MCV: 95 fL (ref 79–97)
Monocytes Absolute: 0.7 10*3/uL (ref 0.1–0.9)
Monocytes: 10 %
Neutrophils Absolute: 3.5 10*3/uL (ref 1.4–7.0)
Neutrophils: 52 %
Platelets: 196 10*3/uL (ref 150–450)
RBC: 4.63 x10E6/uL (ref 4.14–5.80)
RDW: 14.3 % (ref 11.6–15.4)
WBC: 6.7 10*3/uL (ref 3.4–10.8)

## 2018-07-08 LAB — BMP8+EGFR
BUN/Creatinine Ratio: 15 (ref 10–24)
BUN: 16 mg/dL (ref 8–27)
CO2: 24 mmol/L (ref 20–29)
Calcium: 9.6 mg/dL (ref 8.6–10.2)
Chloride: 106 mmol/L (ref 96–106)
Creatinine, Ser: 1.04 mg/dL (ref 0.76–1.27)
GFR calc Af Amer: 82 mL/min/{1.73_m2} (ref >59)
GFR calc non Af Amer: 71 mL/min/{1.73_m2} (ref >59)
Glucose: 90 mg/dL (ref 65–99)
Potassium: 4.2 mmol/L (ref 3.5–5.2)
Sodium: 141 mmol/L (ref 134–144)

## 2018-07-08 LAB — HEPATIC FUNCTION PANEL
ALT: 17 IU/L (ref 0–44)
AST: 17 IU/L (ref 0–40)
Albumin: 4.3 g/dL (ref 3.7–4.7)
Alkaline Phosphatase: 91 IU/L (ref 39–117)
Bilirubin Total: 0.4 mg/dL (ref 0.0–1.2)
Bilirubin, Direct: 0.11 mg/dL (ref 0.00–0.40)
Total Protein: 6.6 g/dL (ref 6.0–8.5)

## 2018-07-08 LAB — LIPID PANEL
Chol/HDL Ratio: 2.2 ratio (ref 0.0–5.0)
Cholesterol, Total: 125 mg/dL (ref 100–199)
HDL: 58 mg/dL (ref >39)
LDL Calculated: 57 mg/dL (ref 0–99)
Triglycerides: 48 mg/dL (ref 0–149)
VLDL Cholesterol Cal: 10 mg/dL (ref 5–40)

## 2018-07-08 LAB — VITAMIN D 25 HYDROXY (VIT D DEFICIENCY, FRACTURES): Vit D, 25-Hydroxy: 42.1 ng/mL (ref 30.0–100.0)

## 2018-07-09 ENCOUNTER — Telehealth: Payer: Self-pay | Admitting: Radiology

## 2018-07-09 ENCOUNTER — Other Ambulatory Visit: Payer: Self-pay | Admitting: *Deleted

## 2018-07-09 DIAGNOSIS — R001 Bradycardia, unspecified: Secondary | ICD-10-CM

## 2018-07-09 DIAGNOSIS — I441 Atrioventricular block, second degree: Secondary | ICD-10-CM

## 2018-07-09 NOTE — Telephone Encounter (Signed)
Enrolled patient for a 3 day Zio monitor to be mailed. Brief instructions were gone over with the patient and he knows to expect the monitor to arrive in 3-4 days

## 2018-07-20 ENCOUNTER — Ambulatory Visit (INDEPENDENT_AMBULATORY_CARE_PROVIDER_SITE_OTHER): Payer: Medicare Other

## 2018-07-20 DIAGNOSIS — I441 Atrioventricular block, second degree: Secondary | ICD-10-CM

## 2018-07-20 DIAGNOSIS — R001 Bradycardia, unspecified: Secondary | ICD-10-CM

## 2018-08-01 ENCOUNTER — Other Ambulatory Visit: Payer: Self-pay | Admitting: Cardiovascular Disease

## 2018-08-02 ENCOUNTER — Other Ambulatory Visit: Payer: Self-pay | Admitting: Pharmacist

## 2018-08-02 MED ORDER — PRALUENT 75 MG/ML ~~LOC~~ SOAJ: 75.0000 mg | SUBCUTANEOUS | 11 refills | Status: DC

## 2018-08-02 NOTE — Telephone Encounter (Signed)
Xarelto 20mg  refill request received; pt is 73 yrs old, wt-75.3kg, Crea-1.04 on 07/07/2018, last Telemedicine visit by Vin on 05/04/2018, Diagnosis Afib, CrCl-67.70ml/min; will send in refill to requested pharmacy.

## 2018-08-03 ENCOUNTER — Other Ambulatory Visit: Payer: Self-pay

## 2018-08-03 ENCOUNTER — Other Ambulatory Visit: Payer: Self-pay | Admitting: Physician Assistant

## 2018-08-03 DIAGNOSIS — I441 Atrioventricular block, second degree: Secondary | ICD-10-CM

## 2018-08-03 DIAGNOSIS — R001 Bradycardia, unspecified: Secondary | ICD-10-CM

## 2018-08-13 ENCOUNTER — Telehealth: Payer: Self-pay | Admitting: *Deleted

## 2018-08-13 NOTE — Telephone Encounter (Signed)
Spoke with patient and he did not want to have a virtual visit with Dr. Margaretann Loveless on August 12 at 2 PM and was upset. I told him that we could get him scheduled to see one of the other providers since he really felt like he needed to be seen but he refused to see anyone else. He told me to cancel the appointment and then he hung up.

## 2018-08-13 NOTE — Telephone Encounter (Signed)
    COVID-19 Pre-Screening Questions:  . In the past 7 to 10 days have you had a cough,  shortness of breath, headache, congestion, fever (100 or greater) body aches, chills, sore throat, or sudden loss of taste or sense of smell? NO  . Have you been around anyone with known Covid 19. NO . Have you been around anyone who is awaiting Covid 19 test results in the past 7 to 10 days? NO . Have you been around anyone who has been exposed to Covid 19, or has mentioned symptoms of Covid 19 within the past 7 to 10 days? NO  If you have any concerns/questions about symptoms patients report during screening (either on the phone or at threshold). Contact the provider seeing the patient or DOD for further guidance.  If neither are available contact a member of the leadership team.              Spoke with patient and got him an appointment to see Dustin Bauer on Tuesday August 17, 2018 at 2:45 PM.

## 2018-08-16 NOTE — Progress Notes (Signed)
Cardiology Office Note   Date:  08/17/2018   ID:  BOYCE KELTNER, DOB 08/10/1945, MRN [id number]  PCP:  Dettinger, Fransisca Kaufmann, MD and Dr Laurance Flatten  Cardiologist: Percival Spanish (new for follow up as he lives in [place]). Previously Dr.McLean prior to him specializing in [facility] clinic.   CC: Follow Up CAD, Cardiac monitor    History of Present Illness: Dustin Bauer is a 73 y.o. male who presents for ongoing assessment and management of atrial fibrillation, s/p ablation at [facility], CAD with PCI to the LAD 2011, inferoposterior STEMI in 2005 requiring DES to occluded RCA (100%), with EF of 87%-68% complicated by CHB which resolved without need for PPM implantation. Repeat LHC in 2017 revealing patent RCA and LAD. HR in the 40's.   He complained of DOE but no chest pain. Given his history of CAD with multiple interventions he was planned for a NM study once COVID restrictions were lifted.A Zio cardiac monitor was placed on 07/20/2018 -07/23/2018 to evaluate for symptoms of DOE.    Monitor revealed maximum HR of 121 bpm, Minimum HR of 33 bpm, Avg HR of 57 bpm. <1% atrial fib burden.   He states that he feels out of shape as he has not been able to go to the gym due to [place] restrictions. He walks about an hour a day with his dog, sometimes longer. He does walk in the humid weather in the afternoon, "I am not a morning person."  He does have to stop sometimes to rest when he is walking to catch his breath when walking in the heat and humidity.   Past Medical History:  Diagnosis Date  . AAA (abdominal aortic aneurysm) ([place]) 01/20/2017   3.9cm infrarenal  . Allergy to ACE inhibitors    Cough and fatigue with ACEI  . Aortic atherosclerosis ([place]) 08/18/2017   Noted on CT chest  . Bigeminy 10/23/2017   Noted on ECG  . CAD (coronary artery disease) stent x 1 2011 and 1  2014   a. LHC in 2011 for stable angina >> DES to pLAD [facility])  //  b. inf-post STEMI 7/15 c/b CHB with jxn escape >> LHC: OM1 60-70, pLAD 30-40, m-d  RCA occluded, EF 50-55 >> PCI: 3 x 33 mm Xience DES to RCA  //  c. LHC 8/17: dLM 20, pLAD 20, pLAD stent ok, pRI 30, pRCA stent ok with 30% ISR  . Colon polyps   . Complication of anesthesia    no memories from right after last colonscopy/endoscopy may 2018, did not last long  . Diverticulosis   . Emphysema lung ([place]) 08/18/2017   Noted on CT chest  . First degree AV block 10/23/2017   Noted on EKG  . Gastric stenosis 08/27/2016   at pylorus, noted on endoscopy  . GERD (gastroesophageal reflux disease)   . History of echocardiogram    Echo 12/15:  Mild LVH, EF 60-65%, normal wall motion, grade 1 diastolic dysfunction, LA upper limits of normal, mild RAE  . Hydrocele, bilateral 01/30/2017   Noted on CT chest  . Hydrocele, left   . Hyperlipidemia    Myalgias with Zocor, Crestor, atorvastatin.   Marland Kitchen Hyperplasia of prostate   . Myocardial infarction ([place]) 2014  . Nicotine abuse   . PAF (paroxysmal atrial fibrillation) [facility])    s/p ablation by Dr Roxan Hockey at [facility] in 2012 // Xarelto anticoagulation  //  ILR removed 2/17 // Event Monitor 11/17: Predominantly NSR (90%). Paroxysmal atrial fibrillation (10%).  Atrial fibrillation appears to be rate-controlled when it occurs.   . Prostate cancer ([place]) dx 2019  . Pulmonary nodule, right 08/18/2017   6 mm right upper lobe, 5.5 mm right middle lobe, Noted on CT Chest  . Pyloric stenosis   . Thoracic ascending aortic aneurysm ([place]) 01/30/2017   3.9 cm    Past Surgical History:  Procedure Laterality Date  . ATRIAL FIBRILLATION ABLATION  07/2010   Afib ablation by Dr Roxan Hockey at [facility] in 2012  . BALLOON DILATION N/A 07/31/2016   Procedure: BALLOON DILATION;  Surgeon: Manus Gunning, MD;  Location: [facility];  Service: Gastroenterology;  Laterality: N/A;  . BALLOON DILATION N/A 08/27/2016   Procedure: BALLOON DILATION;  Surgeon: Manus Gunning, MD;  Location: [facility];  Service: Gastroenterology;  Laterality: N/A;  .  CARDIAC CATHETERIZATION    . CARDIAC CATHETERIZATION N/A 08/17/2015   Procedure: Left Heart Cath and Coronary Angiography;  Surgeon: Larey Dresser, MD;  Location: [place] CV [facility];  Service: Cardiovascular;  Laterality: N/A;  . COLONOSCOPY W/ POLYPECTOMY  06/26/2016  . CORONARY STENT PLACEMENT     x 2  . EP IMPLANTABLE DEVICE N/A 03/09/2015   Procedure: Loop Recorder Removal;  Surgeon: [name], MD;  Location: [place] CV LAB;  Service: Cardiovascular;  Laterality: N/A;  . ESOPHAGOGASTRODUODENOSCOPY N/A 07/31/2016   Procedure: ESOPHAGOGASTRODUODENOSCOPY (EGD) also have pediatric scope available;  Surgeon: Manus Gunning, MD;  Location: [facility];  Service: Gastroenterology;  Laterality: N/A;  . ESOPHAGOGASTRODUODENOSCOPY N/A 08/27/2016   Procedure: ESOPHAGOGASTRODUODENOSCOPY (EGD);  Surgeon: Manus Gunning, MD;  Location: [facility];  Service: Gastroenterology;  Laterality: N/A;  . implantable loop recorder implantation  08/13/2010   MDT Reveal XT implanted by Dr Roxan Hockey for afib management post ablation  . LEFT HEART CATH N/A 07/15/2013   Procedure: LEFT HEART CATH;  Surgeon: Clent Demark, MD;  Location: [facility] CATH LAB;  Service: Cardiovascular;  Laterality: N/A;  . loop recorder removed  2017  . RADIOACTIVE SEED IMPLANT N/A 11/27/2017   Procedure: RADIOACTIVE SEED IMPLANT/BRACHYTHERAPY IMPLANT;  Surgeon: Kathie Rhodes, MD;  Location: [facility];  Service: Urology;  Laterality: N/A;  . SPACE OAR INSTILLATION N/A 11/27/2017   Procedure: SPACE OAR INSTILLATION;  Surgeon: Kathie Rhodes, MD;  Location: [facility];  Service: Urology;  Laterality: N/A;  . TONSILLECTOMY  age 89     Current Outpatient Medications  Medication Sig Dispense Refill  . acetaminophen (TYLENOL) 325 MG tablet Take 325 mg by mouth every 6 (six) hours as needed (FOR PAIN.).    Marland Kitchen Alirocumab (PRALUENT) 75 MG/ML SOAJ Inject 75 mg into the skin every 14 (fourteen)  days. 2 pen 11  . Cholecalciferol (SM VITAMIN D3) 4000 units CAPS Take 4,000 Units by mouth daily.    . Melatonin 3 MG TABS Take 3 mg by mouth at bedtime.    Alveda Reasons 20 MG TABS tablet TAKE 1 TABLET BY MOUTH DAILY WITH SUPPER 90 tablet 1   No current facility-administered medications for this visit.     Allergies:   Clopidogrel, Crestor [rosuvastatin calcium], Lipitor [atorvastatin calcium], and Statins    Social History:  The patient  reports that he quit smoking about 15 years ago. His smoking use included cigarettes. He has a 7.50 pack-year smoking history. He has never used smokeless tobacco. He reports current alcohol use of about 1.0 standard drinks of alcohol per week. He reports that he does not use drugs.   Family  History:  The patient's family history includes Colon cancer in his maternal grandmother and mother; Dementia in his mother; Heart disease in his father; Hyperlipidemia in his father; Pancreatic cancer in his sister; Prostate cancer in his mother.    ROS: All other systems are reviewed and negative. Unless otherwise mentioned in H&P    PHYSICAL EXAM: VS:  BP (!) 147/85   Pulse (!) 45   Temp (!) 97.2 F (36.2 C)   Ht 5\' 10"  (1.778 m)   Wt 167 lb (75.8 kg)   BMI 23.96 kg/m  , BMI Body mass index is 23.96 kg/m. GEN: Well nourished, well developed, in no acute distress HEENT: normal Neck: no JVD, carotid bruits, or masses Cardiac: RRR; no murmurs, rubs, or gallops,no edema  Respiratory:  Clear to auscultation bilaterally, normal work of breathing GI: soft, nontender, nondistended, + BS, No bruits.  MS: no deformity or atrophy Skin: warm and dry, no rash, ecchymosis on the forearms bilaterally.  Neuro:  Strength and sensation are intact Psych: euthymic mood, full affect   EKG:  Not completed this office visit.   Recent Labs: 07/07/2018: ALT 17; BUN 16; Creatinine, Ser 1.04; Hemoglobin 14.4; Platelets 196; Potassium 4.2; Sodium 141    Lipid Panel     Component Value Date/Time   CHOL 125 07/07/2018 0834   CHOL 100 (L) 12/28/2014 1609   TRIG 48 07/07/2018 0834   TRIG 95 06/04/2015 1501   HDL 58 07/07/2018 0834   HDL 55 06/04/2015 1501   CHOLHDL 2.2 07/07/2018 0834   CHOLHDL 2.2 11/27/2015 1636   VLDL 17 11/27/2015 1636   LDLCALC 57 07/07/2018 0834   LDLCALC 33 12/28/2014 1609      Wt Readings from Last 3 Encounters:  08/17/18 167 lb (75.8 kg)  05/04/18 164 lb (74.4 kg)  03/31/18 166 lb (75.3 kg)      Other studies Reviewed: Cardiac Monitor 07/20/2018-7/10/220  Patient had a min HR of 33 bpm, max HR of 121 bpm, and avg HR of 57 bpm.Predominant underlying rhythm was Sinus Rhythm. First Degree AV Block waspresent. 1 run of Supraventricular Tachycardia occurred lasting 4 beats with a maxrate of 102 bpm (avg 96 bpm). Atrial Fibrillation occurred (<1% burden), rangingfrom 48-73 bpm (avg of 58 bpm), the longest lasting 6 mins 20 secs with an avgrate of 58 bpm. Atrial Fibrillation was present at activation of device. Isolated SVEswere occasional (1.4%, 2829), SVE Couplets were rare (<1.0%, 93), and no SVETriplets were present. Isolated VEs were rare (<1.0%), and no VE Couplets or VETriplets were present. Ventricular Bigeminy and Trigeminy were present.   ASSESSMENT AND PLAN:  1. CAD:  Hx of PCI to the LAD in 2011, and DES to the RCA (2005). Repeat cath in 2017 revealed patent stents to both vessels. He denies chest pain but has been having DOE when walking. I will plan a NM stress test to evaluate for progression of ischemia. I have explained the stress test procedure concerning the NM portion and the exercise portion, He verbalized understanding and is willing to proceed.  2. PAF: Cardiac monitor determined that he had <1% atrial fib burden. Average HR 57 bpm. I will not make any changes on his medication regimen. He continues on Xarelto without bleeding. He is not on rate control medications at this time.   3. Hypercholesterolemia:  He  remains on Praluent injections. Last labs are reviewed from 07/07/2018, drawn by PCP, LDL-57, TC 125, HDL 58.    4. Hx of AAA: Most  recent abdominal ultrasound on 03/18/2018 diameter 3.9 cm. Unchanged from previous ultrasound on year previously.    Current medicines are reviewed at length with the patient today.  He wishes to follow up in [place] and be established with Dr. Percival Spanish as this is closer to his home. He will be seen on Sept 23, 2020.  Labs/ tests ordered today include: NM Stress Test   Phill Myron. West Pugh, ANP, AACC   08/17/2018 3:23 PM    [facility] [address] Office [phone] Fax [phone]

## 2018-08-17 ENCOUNTER — Encounter: Payer: Self-pay | Admitting: Adult Health

## 2018-08-17 ENCOUNTER — Ambulatory Visit (INDEPENDENT_AMBULATORY_CARE_PROVIDER_SITE_OTHER): Payer: Medicare Other | Admitting: Adult Health

## 2018-08-17 ENCOUNTER — Other Ambulatory Visit: Payer: Self-pay

## 2018-08-17 VITALS — BP 147/85 | HR 45 | Temp 97.2°F | Ht 70.0 in | Wt 167.0 lb

## 2018-08-17 DIAGNOSIS — E7800 Pure hypercholesterolemia, unspecified: Secondary | ICD-10-CM

## 2018-08-17 DIAGNOSIS — I480 Paroxysmal atrial fibrillation: Secondary | ICD-10-CM | POA: Diagnosis not present

## 2018-08-17 DIAGNOSIS — I714 Abdominal aortic aneurysm, without rupture, unspecified: Secondary | ICD-10-CM

## 2018-08-17 DIAGNOSIS — I25119 Atherosclerotic heart disease of native coronary artery with unspecified angina pectoris: Secondary | ICD-10-CM

## 2018-08-17 DIAGNOSIS — R0600 Dyspnea, unspecified: Secondary | ICD-10-CM

## 2018-08-17 NOTE — Patient Instructions (Addendum)
Medication Instructions:  Continue current medications  If you need a refill on your cardiac medications before your next appointment, please call your pharmacy.  Labwork: None Ordered   Testing/Procedures: Your physician has requested that you have an exercise stress myoview. For further information please visit [email or web address]. Please follow instruction sheet, as given.  Follow-Up: . Your physician recommends that you schedule a follow-up appointment in: September 23rd with Dr Percival Spanish in [place] at 4:00 pm  At [facility], you and your health needs are our priority.  As part of our continuing mission to provide you with exceptional heart care, we have created designated Provider Care Teams.  These Care Teams include your primary Cardiologist (physician) and Advanced Practice Providers (APPs -  Physician Assistants and Nurse Practitioners) who all work together to provide you with the care you need, when you need it.  Thank you for choosing [facility]!!

## 2018-08-19 ENCOUNTER — Telehealth (HOSPITAL_COMMUNITY): Payer: Self-pay

## 2018-08-19 NOTE — Telephone Encounter (Signed)
Encounter complete. 

## 2018-08-20 ENCOUNTER — Other Ambulatory Visit (HOSPITAL_COMMUNITY)
Admission: RE | Admit: 2018-08-20 | Discharge: 2018-08-20 | Disposition: A | Discharge status: Home / Self Care | Payer: Medicare Other | Source: Ambulatory Visit | Attending: Adult Health | Admitting: Adult Health

## 2018-08-20 DIAGNOSIS — Z20828 Contact with and (suspected) exposure to other viral communicable diseases: Secondary | ICD-10-CM | POA: Diagnosis not present

## 2018-08-20 DIAGNOSIS — Z01812 Encounter for preprocedural laboratory examination: Secondary | ICD-10-CM | POA: Insufficient documentation

## 2018-08-21 LAB — SARS CORONAVIRUS 2 (TAT 6-24 HRS): SARS Coronavirus 2: NEGATIVE

## 2018-08-24 ENCOUNTER — Other Ambulatory Visit: Payer: Self-pay

## 2018-08-24 ENCOUNTER — Ambulatory Visit (HOSPITAL_COMMUNITY)
Admission: RE | Admit: 2018-08-24 | Discharge: 2018-08-24 | Disposition: A | Discharge status: Home / Self Care | Payer: Medicare Other | Source: Ambulatory Visit | Attending: Cardiovascular Disease | Admitting: Cardiovascular Disease

## 2018-08-24 DIAGNOSIS — I25119 Atherosclerotic heart disease of native coronary artery with unspecified angina pectoris: Secondary | ICD-10-CM | POA: Diagnosis present

## 2018-08-24 DIAGNOSIS — R0600 Dyspnea, unspecified: Secondary | ICD-10-CM

## 2018-08-24 LAB — MYOCARDIAL PERFUSION IMAGING
LV dias vol: 146 mL (ref 62–150)
LV sys vol: 77 mL
Peak HR: 54 {beats}/min
Rest HR: 40 {beats}/min
SDS: 3
SRS: 3
SSS: 6
TID: 1.04

## 2018-08-24 MED ORDER — REGADENOSON 0.4 MG/5ML IV SOLN
0.4000 mg | Freq: Once | INTRAVENOUS | Status: AC
  Administered 2018-08-24: 0.4 mg via INTRAVENOUS

## 2018-08-24 MED ORDER — TECHNETIUM TC 99M TETROFOSMIN IV KIT
9.8000 | PACK | Freq: Once | INTRAVENOUS | Status: AC | PRN
  Administered 2018-08-24: 10.1 via INTRAVENOUS
  Filled 2018-08-24: qty 10

## 2018-08-24 MED ORDER — TECHNETIUM TC 99M TETROFOSMIN IV KIT
30.1000 | PACK | Freq: Once | INTRAVENOUS | Status: AC | PRN
  Administered 2018-08-24: 30 via INTRAVENOUS
  Filled 2018-08-24: qty 31

## 2018-08-25 ENCOUNTER — Ambulatory Visit: Payer: Medicare Other | Admitting: Internal Medicine

## 2018-08-25 ENCOUNTER — Telehealth: Payer: Self-pay | Admitting: Adult Health

## 2018-08-25 NOTE — Telephone Encounter (Signed)
Routed to NP/CMA No result note on stress test

## 2018-08-25 NOTE — Telephone Encounter (Signed)
I sent the result note out a few hours ago and also in My Chart

## 2018-08-25 NOTE — Telephone Encounter (Signed)
New message   Patient is returning call for myocardial perfusion results. Please call.

## 2018-08-26 NOTE — Telephone Encounter (Signed)
Notes recorded by Vennie Homans on 08/25/2018 at 4:25 PM EDT  Result release to MyChart

## 2018-10-05 ENCOUNTER — Encounter: Payer: Self-pay | Admitting: Cardiology

## 2018-10-05 NOTE — Progress Notes (Signed)
Cardiology Office Note   Date:  10/07/2018   ID:  Dustin Bauer, DOB January 25, 1945, MRN [id number]  PCP:  Dettinger, Fransisca Kaufmann, MD  Cardiologist:   Minus Breeding, MD  Chief Complaint  Patient presents with  . Coronary Artery Disease      History of Present Illness: Dustin Bauer is a 73 y.o. male who presents for follow up of atrial fib.  He is s/p ablation at [facility], CAD with PCI to the LAD 2011, inferoposterior STEMI in 2015 requiring DES to occluded RCA (100%), with EF of 123456 complicated by CHB which resolved without need for PPM implantation. Repeat LHC in 2017 revealing patent RCA and LAD. HR in the 40's.  He had a small fixed moderate intensity mid anterospetal perfusion defect recently on a Lexiscan Myoview that was ordered to evaluate chest pain.  This is my first visit with him.     He does well.  He exercises routinely.  He is not getting any chest pressure similar to what he had prior to his previous cardiac interventions. The patient denies any new symptoms such as chest discomfort, neck or arm discomfort. There has been no new shortness of breath, PND or orthopnea. There have been no reported palpitations, presyncope or syncope.    Past Medical History:  Diagnosis Date  . AAA (abdominal aortic aneurysm) ([place]) 01/20/2017   3.9cm infrarenal  . Allergy to ACE inhibitors    Cough and fatigue with ACEI  . Aortic atherosclerosis ([place]) 08/18/2017   Noted on CT chest  . CAD (coronary artery disease) stent x 1 2011 and 1  2014   a. LHC in 2011 for stable angina >> DES to pLAD [facility])  //  b. inf-post STEMI 7/15 c/b CHB with jxn escape >> LHC: OM1 60-70, pLAD 30-40, m-d RCA occluded, EF 50-55 >> PCI: 3 x 33 mm Xience DES to RCA  //  c. LHC 8/17: dLM 20, pLAD 20, pLAD stent ok, pRI 30, pRCA stent ok with 30% ISR  . Colon polyps   . Diverticulosis   . Emphysema lung ([place]) 08/18/2017   Noted on CT chest  . Gastric stenosis 08/27/2016   at pylorus, noted on endoscopy  . GERD  (gastroesophageal reflux disease)   . Hydrocele, bilateral 01/30/2017   Noted on CT chest  . Hyperlipidemia    Myalgias with Zocor, Crestor, atorvastatin.   . Nicotine abuse   . PAF (paroxysmal atrial fibrillation) [facility])    s/p ablation by Dr Roxan Hockey at [facility] in 2012 // Xarelto anticoagulation  //  ILR removed 2/17 // Event Monitor 11/17: Predominantly NSR (90%). Paroxysmal atrial fibrillation (10%).  Atrial fibrillation appears to be rate-controlled when it occurs.   . Prostate cancer ([place]) dx 2019  . Pulmonary nodule, right 08/18/2017   6 mm right upper lobe, 5.5 mm right middle lobe, Noted on CT Chest  . Pyloric stenosis   . Thoracic ascending aortic aneurysm ([place]) 01/30/2017   3.9 cm    Past Surgical History:  Procedure Laterality Date  . ATRIAL FIBRILLATION ABLATION  07/2010   Afib ablation by Dr Roxan Hockey at [facility] in 2012  . BALLOON DILATION N/A 07/31/2016   Procedure: BALLOON DILATION;  Surgeon: Manus Gunning, MD;  Location: [facility];  Service: Gastroenterology;  Laterality: N/A;  . BALLOON DILATION N/A 08/27/2016   Procedure: BALLOON DILATION;  Surgeon: Manus Gunning, MD;  Location: [facility];  Service: Gastroenterology;  Laterality: N/A;  . CARDIAC CATHETERIZATION    .  CARDIAC CATHETERIZATION N/A 08/17/2015   Procedure: Left Heart Cath and Coronary Angiography;  Surgeon: Larey Dresser, MD;  Location: [place] CV LAB;  Service: Cardiovascular;  Laterality: N/A;  . COLONOSCOPY W/ POLYPECTOMY  06/26/2016  . CORONARY STENT PLACEMENT     x 2  . EP IMPLANTABLE DEVICE N/A 03/09/2015   Procedure: Loop Recorder Removal;  Surgeon: [name], MD;  Location: [place] CV LAB;  Service: Cardiovascular;  Laterality: N/A;  . ESOPHAGOGASTRODUODENOSCOPY N/A 07/31/2016   Procedure: ESOPHAGOGASTRODUODENOSCOPY (EGD) also have pediatric scope available;  Surgeon: Manus Gunning, MD;  Location: [facility];  Service: Gastroenterology;  Laterality:  N/A;  . ESOPHAGOGASTRODUODENOSCOPY N/A 08/27/2016   Procedure: ESOPHAGOGASTRODUODENOSCOPY (EGD);  Surgeon: Manus Gunning, MD;  Location: [facility];  Service: Gastroenterology;  Laterality: N/A;  . implantable loop recorder implantation  08/13/2010   MDT Reveal XT implanted by Dr Roxan Hockey for afib management post ablation  . LEFT HEART CATH N/A 07/15/2013   Procedure: LEFT HEART CATH;  Surgeon: Clent Demark, MD;  Location: [facility] CATH LAB;  Service: Cardiovascular;  Laterality: N/A;  . loop recorder removed  2017  . RADIOACTIVE SEED IMPLANT N/A 11/27/2017   Procedure: RADIOACTIVE SEED IMPLANT/BRACHYTHERAPY IMPLANT;  Surgeon: Kathie Rhodes, MD;  Location: [facility];  Service: Urology;  Laterality: N/A;  . SPACE OAR INSTILLATION N/A 11/27/2017   Procedure: SPACE OAR INSTILLATION;  Surgeon: Kathie Rhodes, MD;  Location: [facility];  Service: Urology;  Laterality: N/A;  . TONSILLECTOMY  age 89     Current Outpatient Medications  Medication Sig Dispense Refill  . acetaminophen (TYLENOL) 325 MG tablet Take 325 mg by mouth every 6 (six) hours as needed (FOR PAIN.).    Marland Kitchen Alirocumab (PRALUENT) 75 MG/ML SOAJ Inject 75 mg into the skin every 14 (fourteen) days. 2 pen 11  . Cholecalciferol (SM VITAMIN D3) 4000 units CAPS Take 4,000 Units by mouth daily.    . tamsulosin (FLOMAX) 0.4 MG CAPS capsule Take 0.4 mg by mouth daily.    Alveda Reasons 20 MG TABS tablet TAKE 1 TABLET BY MOUTH DAILY WITH SUPPER 90 tablet 1   No current facility-administered medications for this visit.     Allergies:   Clopidogrel, Crestor [rosuvastatin calcium], Lipitor [atorvastatin calcium], and Statins    Social History:  The patient  reports that he quit smoking about 15 years ago. His smoking use included cigarettes. He has a 7.50 pack-year smoking history. He has never used smokeless tobacco. He reports current alcohol use of about 1.0 standard drinks of alcohol per week. He reports  that he does not use drugs.   Family History:  The patient's family history includes Colon cancer in his maternal grandmother and mother; Dementia in his mother; Heart disease in his father; Hyperlipidemia in his father; Pancreatic cancer in his sister; Prostate cancer in his mother.    ROS:  Please see the history of present illness.   Otherwise, review of systems are positive for none.   All other systems are reviewed and negative.    PHYSICAL EXAM: VS:  BP 129/70   Pulse (!) 44   Ht 5\' 10"  (1.778 m)   Wt 167 lb (75.8 kg)   BMI 23.96 kg/m  , BMI Body mass index is 23.96 kg/m. GENERAL:  Well appearing HEENT:  Pupils equal round and reactive, fundi not visualized, oral mucosa unremarkable NECK:  No jugular venous distention, waveform within normal limits, carotid upstroke brisk and symmetric, no bruits,  no thyromegaly LYMPHATICS:  No cervical, inguinal adenopathy LUNGS:  Clear to auscultation bilaterally BACK:  No CVA tenderness CHEST:  Unremarkable HEART:  PMI not displaced or sustained,S1 and S2 within normal limits, no S3, no S4, no clicks, no rubs, no murmurs ABD:  Flat, positive bowel sounds normal in frequency in pitch, no bruits, no rebound, no guarding, no midline pulsatile mass, no hepatomegaly, no splenomegaly EXT:  2 plus pulses throughout, no edema, no cyanosis no clubbing SKIN:  No rashes no nodules NEURO:  Cranial nerves II through XII grossly intact, motor grossly intact throughout PSYCH:  Cognitively intact, oriented to person place and time    EKG:  EKG is not ordered today.   Recent Labs: 07/07/2018: ALT 17; BUN 16; Creatinine, Ser 1.04; Hemoglobin 14.4; Platelets 196; Potassium 4.2; Sodium 141    Lipid Panel    Component Value Date/Time   CHOL 125 07/07/2018 0834   CHOL 100 (L) 12/28/2014 1609   TRIG 48 07/07/2018 0834   TRIG 95 06/04/2015 1501   HDL 58 07/07/2018 0834   HDL 55 06/04/2015 1501   CHOLHDL 2.2 07/07/2018 0834   CHOLHDL 2.2 11/27/2015  1636   VLDL 17 11/27/2015 1636   LDLCALC 57 07/07/2018 0834   LDLCALC 33 12/28/2014 1609      Wt Readings from Last 3 Encounters:  10/06/18 167 lb (75.8 kg)  08/24/18 167 lb (75.8 kg)  08/17/18 167 lb (75.8 kg)      Other studies Reviewed: Additional studies/ records that were reviewed today include: Extensive review of previous cardiac records since I have not seen this patient in the past.  . Review of the above records demonstrates:  Please see elsewhere in the note.     ASSESSMENT AND PLAN:  CAD:   The patient has no new sypmtoms.  No further cardiovascular testing is indicated.  We will continue with aggressive risk reduction and meds as listed.  No further testing is indicated.    PAF:     Cardiac monitor determined that he had <1% atrial fib burden.  Given this I think it is prudent to continue with Xarelto he and I had a conversation.    Hypercholesterolemia:  He remains on Praluent injections.  No change in therapy.   Hx of AAA: Most recent abdominal ultrasound on 03/18/2018 diameter 3.9 cm.    Current medicines are reviewed at length with the patient today.  The patient does not have concerns regarding medicines.  The following changes have been made:  no change  Labs/ tests ordered today include: None No orders of the defined types were placed in this encounter.    Disposition:   FU with me in 12 months.      Signed, Minus Breeding, MD  10/07/2018 9:40 AM    Dustin Bauer

## 2018-10-06 ENCOUNTER — Other Ambulatory Visit: Payer: Self-pay

## 2018-10-06 ENCOUNTER — Encounter: Payer: Self-pay | Admitting: Cardiology

## 2018-10-06 ENCOUNTER — Ambulatory Visit: Payer: Medicare Other | Admitting: Cardiology

## 2018-10-06 VITALS — BP 129/70 | HR 44 | Ht 70.0 in | Wt 167.0 lb

## 2018-10-06 DIAGNOSIS — I714 Abdominal aortic aneurysm, without rupture, unspecified: Secondary | ICD-10-CM

## 2018-10-06 DIAGNOSIS — E7800 Pure hypercholesterolemia, unspecified: Secondary | ICD-10-CM | POA: Diagnosis not present

## 2018-10-06 DIAGNOSIS — I25119 Atherosclerotic heart disease of native coronary artery with unspecified angina pectoris: Secondary | ICD-10-CM | POA: Diagnosis not present

## 2018-10-06 DIAGNOSIS — I480 Paroxysmal atrial fibrillation: Secondary | ICD-10-CM

## 2018-10-06 NOTE — Patient Instructions (Signed)
Medication Instructions:  The current medical regimen is effective;  continue present plan and medications.  If you need a refill on your cardiac medications before your next appointment, please call your pharmacy.   Follow-Up: Follow up in 1 year with Dr. Hochrein in [place].  You will receive a letter in the mail 2 months before you are due.  Please call us when you receive this letter to schedule your follow up appointment.  Thank you for choosing [facility]!!     

## 2018-10-07 ENCOUNTER — Encounter: Payer: Self-pay | Admitting: Cardiology

## 2018-10-07 DIAGNOSIS — I714 Abdominal aortic aneurysm, without rupture, unspecified: Secondary | ICD-10-CM | POA: Insufficient documentation

## 2018-10-07 DIAGNOSIS — I25119 Atherosclerotic heart disease of native coronary artery with unspecified angina pectoris: Secondary | ICD-10-CM | POA: Insufficient documentation

## 2018-11-03 ENCOUNTER — Other Ambulatory Visit: Payer: Self-pay

## 2018-11-04 ENCOUNTER — Ambulatory Visit (INDEPENDENT_AMBULATORY_CARE_PROVIDER_SITE_OTHER): Payer: Medicare Other

## 2018-11-04 DIAGNOSIS — Z23 Encounter for immunization: Secondary | ICD-10-CM | POA: Diagnosis not present

## 2018-12-31 ENCOUNTER — Telehealth (HOSPITAL_COMMUNITY): Payer: Self-pay | Admitting: Pharmacist

## 2018-12-31 NOTE — Telephone Encounter (Signed)
Advanced Heart Failure Patient Advocate Encounter  Prior Authorization for Praluent has been approved.    PA# [id number] Effective dates: 12/31/18 through 12/31/19  Audry Riles, PharmD, BCPS, BCCP, CPP Heart Failure Clinic Pharmacist [phone]

## 2019-01-22 IMAGING — RF DG UGI W/ HIGH DENSITY W/KUB
12 of 20 series · 12 of 24 positions shown · non-contrast
Comparison: 06/30/2016 CT scan

CLINICAL DATA: Nausea and vomiting.  Epigastric obstruction.

EXAM:
UPPER GI SERIES WITH KUB
TECHNIQUE: After obtaining a scout radiograph a routine upper GI series was
performed using thin and high density barium.
FLUOROSCOPY TIME:  Fluoroscopy Time:  5 minutes, 0 seconds
Radiation Exposure Index (if provided by the fluoroscopic device):
71.6 mGy
Number of Acquired Spot Images: 4

[Series 2: cp_standard · 0.34mm/px · 1 of 138 frames shown (1 of 11)]
[frame 95/138]
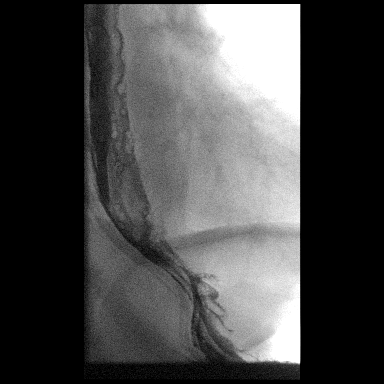

[Series 5: fluoro_barium singleshot_bw · 0.17mm/px · 1 of 1 slices shown]
[im 1/1]
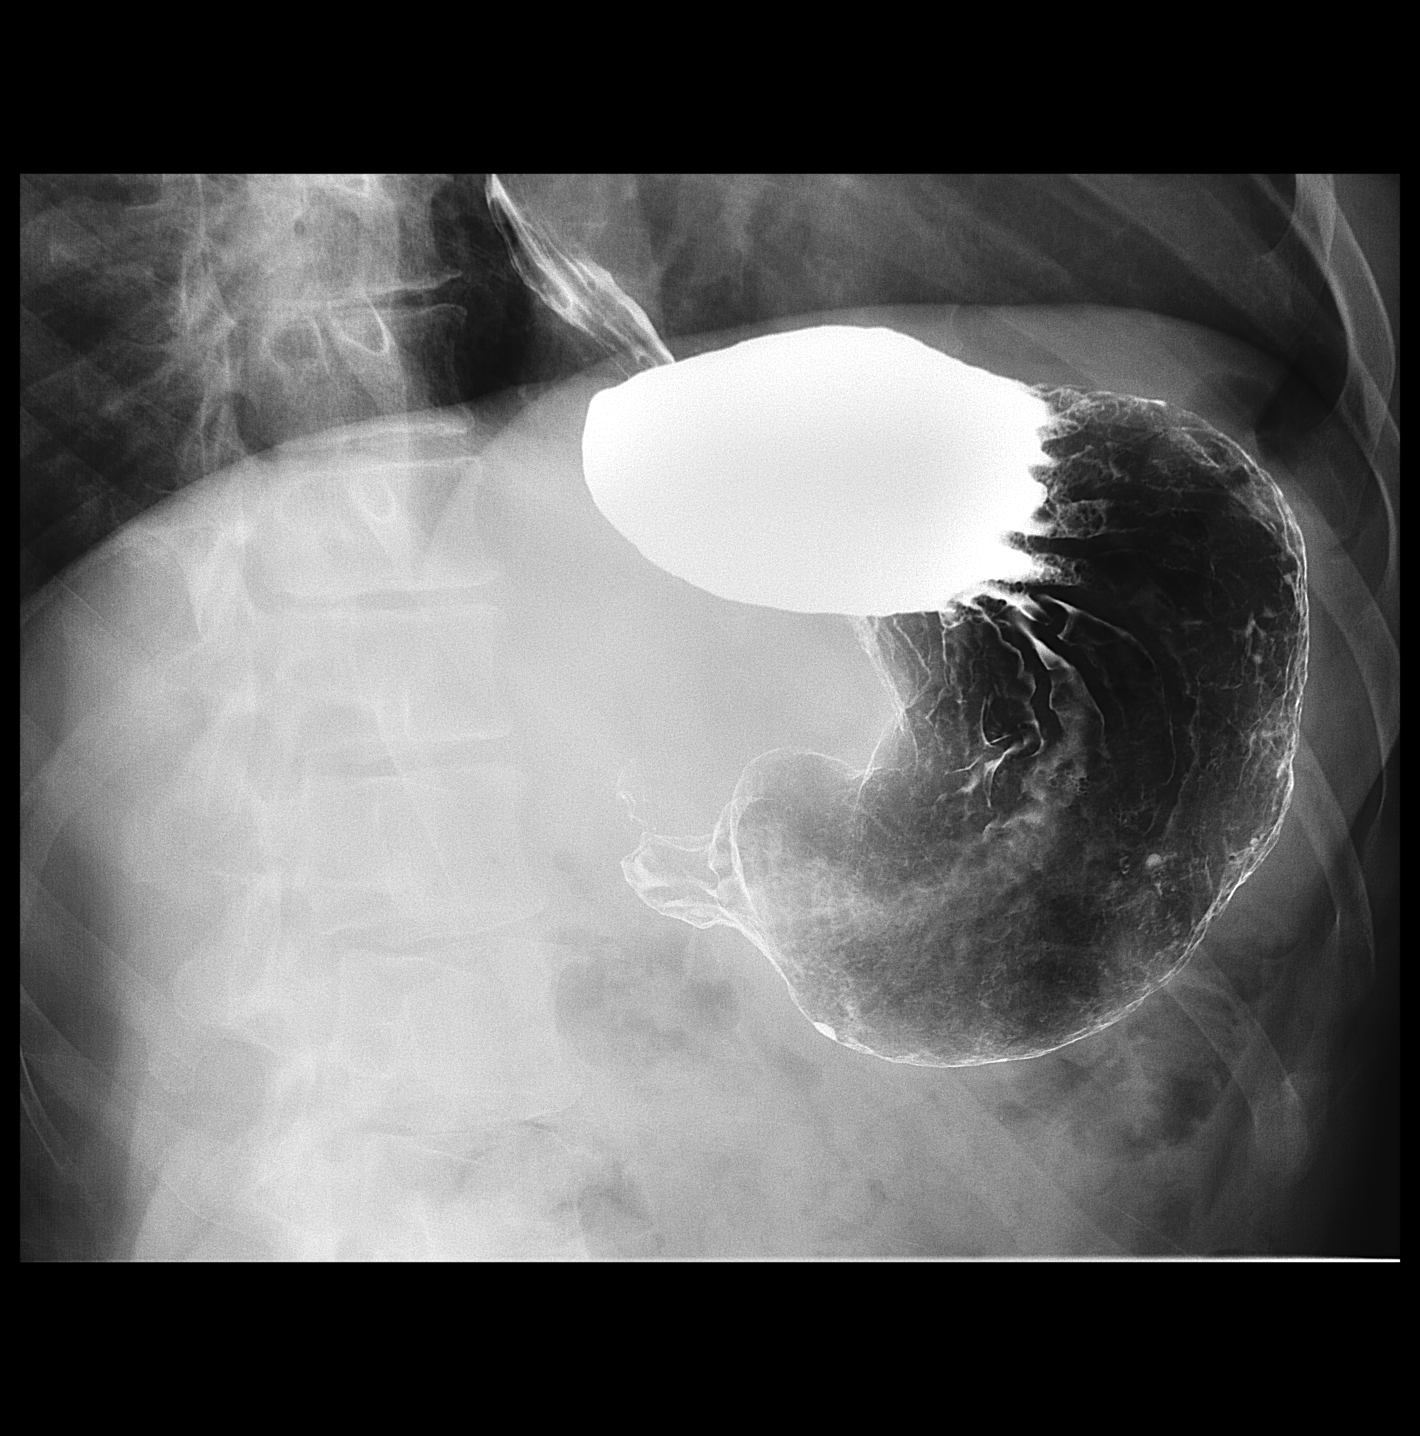

[Series 9: cp_standard · 0.35mm/px · 1 of 68 frames shown (2 of 11)]
[frame 35/68]
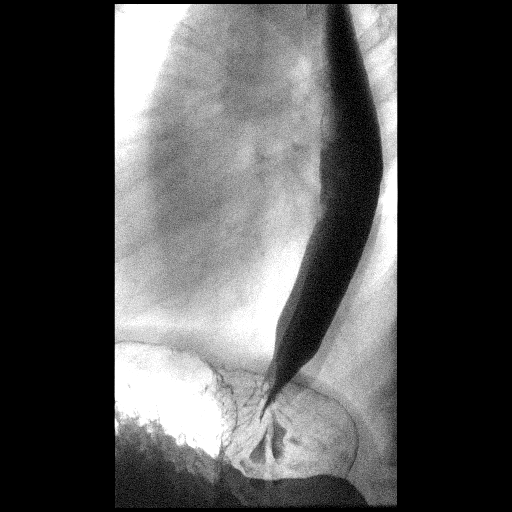

[Series 11: cp_standard · 0.35mm/px · 1 of 28 frames shown (3 of 11)]
[frame 5/28]
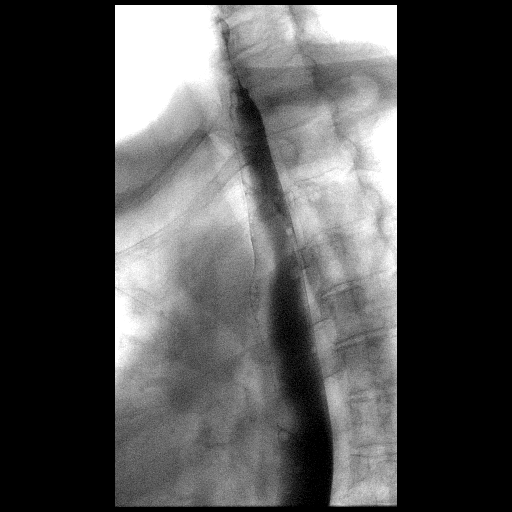

[Series 12: cp_standard · 0.35mm/px · 1 of 35 frames shown (4 of 11)]
[frame 35/35]
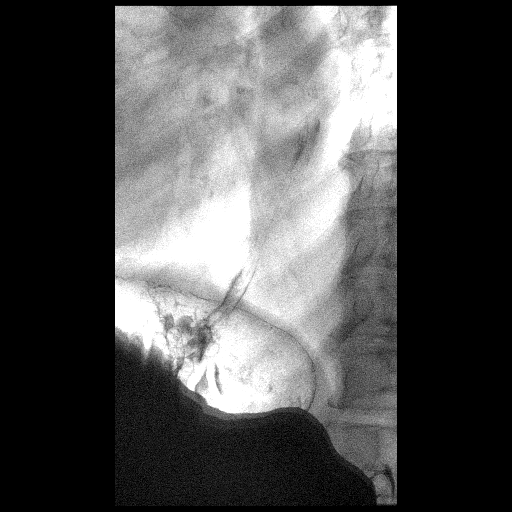

[Series 14: cp_standard · 0.34mm/px · 1 of 66 frames shown (5 of 11)]
[frame 10/66]
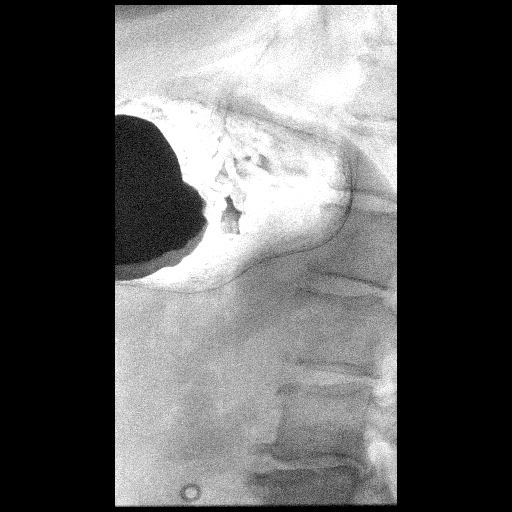

[Series 16: cp_standard · 0.34mm/px · 1 of 27 frames shown (6 of 11)]
[frame 1/27]
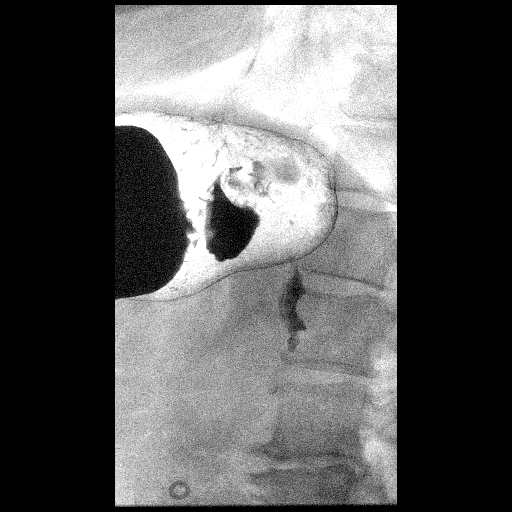

[Series 18: cp_standard · 0.34mm/px · 1 of 31 frames shown (7 of 11)]
[frame 5/31]
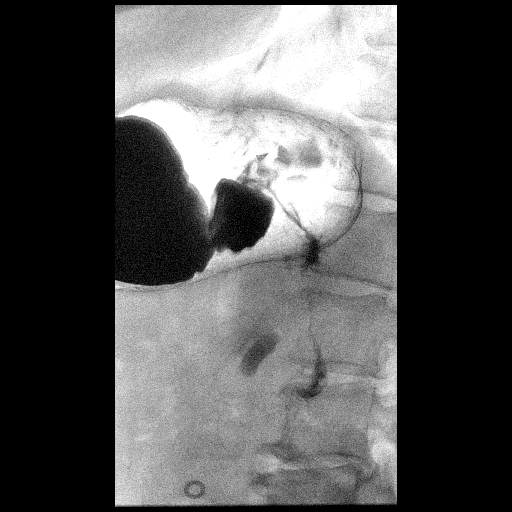

[Series 19: cp_standard · 0.34mm/px · 1 of 20 frames shown (8 of 11)]
[frame 18/20]
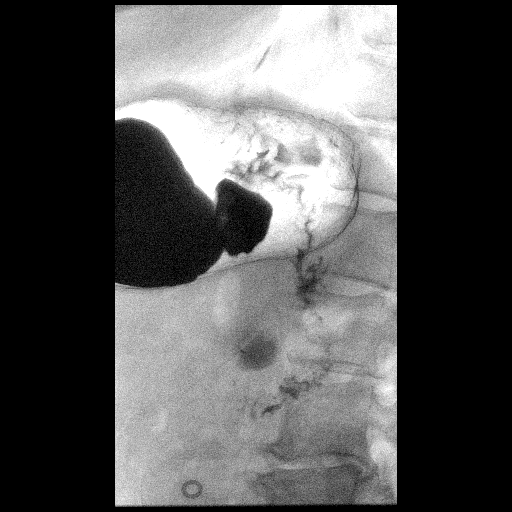

[Series 21: cp_standard · 0.34mm/px · 1 of 35 frames shown (9 of 11)]
[frame 6/35]
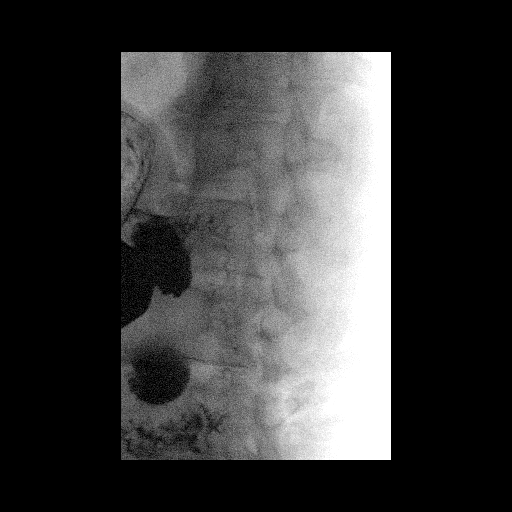

[Series 23: cp_standard · 0.34mm/px · 1 of 48 frames shown (10 of 11)]
[frame 8/48]
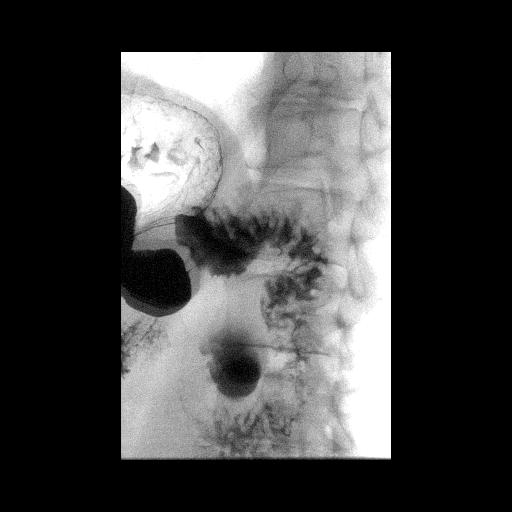

[Series 24: cp_standard · 0.34mm/px · 1 of 19 frames shown (11 of 11)]
[frame 17/19]
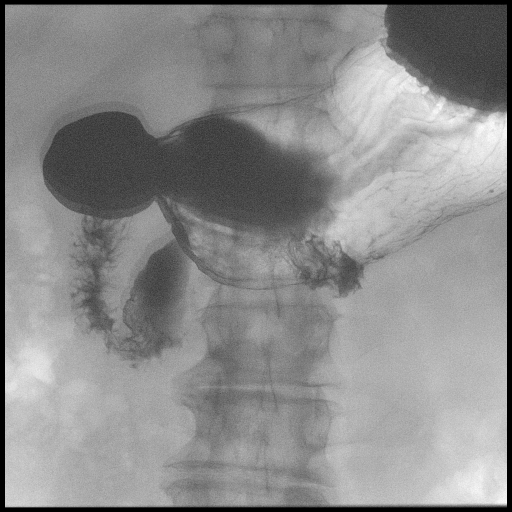

[12 of 24 positions shown; findings below may reference images not displayed]

FINDINGS: Initial KUB demonstrates lumbar spondylosis but unremarkable bowel
gas pattern.

The pharyngeal phase of swallowing was not assessed.

Distal esophageal fold thickening is suggested on mucosal relief
assessment of the distal esophagus, favoring esophagitis. No obvious
esophageal stricture.

Emptying of the stomach was very sluggish. The cause is difficult to
ascertain. The pyloric channel does not appear particularly wide but
does appear somewhat narrow, with poor filling of the pyloric
channel. I do not see a definite mass lesion, nor was there a
visible mass on CT in this vicinity. The duodenal bulb appears
relatively unremarkable.

There is a diverticulum of the third part of the duodenum which is
moderate in size. The duodenum appears otherwise normal. No current
findings of intussusception.
IMPRESSION: 1. Radiography confirms sluggish emptying of the stomach due to
narrowing of the pyloric channel. No definite mass or ulcer is
currently seen. This may reflect postinflammatory stricturing.
2. Distal esophagitis.
3. Diverticulum of the transverse duodenum.

## 2019-02-01 ENCOUNTER — Other Ambulatory Visit: Payer: Self-pay

## 2019-02-01 MED ORDER — PRALUENT 75 MG/ML ~~LOC~~ SOAJ: 75.0000 mg | SUBCUTANEOUS | 11 refills | Status: DC

## 2019-02-01 NOTE — Telephone Encounter (Signed)
Pt called in stating that he received a continuance form from [facility] about continuing Praluent. Pt would like some clarity on still receiving his medication. He would like a call back at [phone] or [phone].

## 2019-02-12 ENCOUNTER — Other Ambulatory Visit: Payer: Self-pay | Admitting: Cardiovascular Disease

## 2019-02-14 NOTE — Telephone Encounter (Signed)
84m 75.8kg Scr 1.04 07/07/18 Lovw/hochrein 10/06/18

## 2019-03-02 ENCOUNTER — Telehealth: Payer: Self-pay | Admitting: Cardiology

## 2019-03-02 NOTE — Telephone Encounter (Signed)
Pharmacy updated.

## 2019-03-02 NOTE — Telephone Encounter (Signed)
New Message   [employer] is calling on behalf of patient to advise that going forward all rx should be sent to [place] [place] [id number] fax number [phone]

## 2019-03-10 ENCOUNTER — Telehealth: Payer: Self-pay | Admitting: Pulmonary Disease

## 2019-03-10 NOTE — Telephone Encounter (Signed)
Called and spoke with Wife, Millie. Millie stated Patient is needing appointment.  Patient is former [facility] patient.  Patient was unavailable to talk at this time.  Advise Millie to have Patient return call this afternoon to get scheduled for OV.

## 2019-03-11 NOTE — Telephone Encounter (Signed)
Patient has not been seen since 2018, would need to establish with a new doctor (30 minute appt) before any CT can be ordered.    lmtcb for pt to schedule appt.

## 2019-03-14 NOTE — Telephone Encounter (Signed)
LMTCB x2 for pt 

## 2019-03-15 NOTE — Telephone Encounter (Signed)
LMTCB x 3 

## 2019-03-16 NOTE — Telephone Encounter (Signed)
We have attempted to contact pt several times with no success or call back from pt. Per triage protocol, message will be closed.  

## 2019-04-27 ENCOUNTER — Encounter: Payer: Self-pay | Admitting: Pulmonary Disease

## 2019-04-27 ENCOUNTER — Other Ambulatory Visit: Payer: Self-pay

## 2019-04-27 ENCOUNTER — Ambulatory Visit: Payer: Medicare Other | Admitting: Pulmonary Disease

## 2019-04-27 VITALS — BP 116/60 | HR 66 | Temp 97.1°F | Ht 70.0 in | Wt 163.6 lb

## 2019-04-27 DIAGNOSIS — R911 Solitary pulmonary nodule: Secondary | ICD-10-CM | POA: Diagnosis not present

## 2019-04-27 DIAGNOSIS — Z87891 Personal history of nicotine dependence: Secondary | ICD-10-CM | POA: Diagnosis not present

## 2019-04-27 DIAGNOSIS — R0600 Dyspnea, unspecified: Secondary | ICD-10-CM | POA: Diagnosis not present

## 2019-04-27 DIAGNOSIS — R0609 Other forms of dyspnea: Secondary | ICD-10-CM

## 2019-04-27 NOTE — Progress Notes (Signed)
Synopsis: Referred in April 2021 for history of lung nodule by Dettinger, Fransisca Kaufmann, MD  Subjective:   PATIENT ID: Dustin Bauer GENDER: male DOB: [birth year], MRN: [id number]  Chief Complaint  Patient presents with  . Consult    former [facility] pt, requesting yearly CT chest    PMH lung nodule, former smoker, AAA, cough from ace. He goes to the gym regularly. He gets winded more easily than before. He tries to walk for an hour. Mowing his yard has be come more difficult. He follows with Dr. Trula Slade for his AAA imaging. 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 He had an MI in gym ~4 years ago and history of stents placed.3  Patient's complaint today is predominantly related to dyspnea on exertion.  He is able to workout but he does feel more short of breath.  He used to work out more extensively prior to [employer].  So he spent several months out of the gym and he is trying to get back to his previous routine but he does know that this can take some time.  Patient denies fevers chills night sweats weight loss.  Denies hemoptysis   Past Medical History:  Diagnosis Date  . AAA (abdominal aortic aneurysm) ([place]) 01/20/2017   3.9cm infrarenal  . Allergy to ACE inhibitors    Cough and fatigue with ACEI  . Aortic atherosclerosis ([place]) 08/18/2017   Noted on CT chest  . CAD (coronary artery disease) stent x 1 2011 and 1  2014   a. LHC in 2011 for stable angina >> DES to pLAD [facility])  //  b. inf-post STEMI 7/15 c/b CHB with jxn escape >> LHC: OM1 60-70, pLAD 30-40, m-d RCA occluded, EF 50-55 >> PCI: 3 x 33 mm Xience DES to RCA  //  c. LHC 8/17: dLM 20, pLAD 20, pLAD stent ok, pRI 30, pRCA stent ok with 30% ISR  . Colon polyps   . Diverticulosis   . Emphysema lung ([place]) 08/18/2017   Noted on CT chest  . Gastric stenosis 08/27/2016   at pylorus, noted on endoscopy  . GERD (gastroesophageal reflux disease)   . Hydrocele, bilateral 01/30/2017   Noted on CT chest  . Hyperlipidemia    Myalgias with Zocor, Crestor, atorvastatin.   . Nicotine abuse   . PAF (paroxysmal atrial fibrillation) [facility])    s/p ablation by Dr Roxan Hockey [facility] in 2012 // Xarelto anticoagulation  //  ILR removed 2/17 // Event Monitor 11/17: Predominantly NSR (90%). Paroxysmal atrial fibrillation (10%).  Atrial fibrillation appears to be rate-controlled when it occurs.   . Prostate cancer ([place]) dx 2019  . Pulmonary nodule, right 08/18/2017   6 mm right upper lobe, 5.5 mm right middle lobe, Noted on CT Chest  . Pyloric stenosis   . Thoracic ascending aortic aneurysm ([place]) 01/30/2017   3.9 cm     Family History  Problem Relation Age of Onset  . Dementia Mother   . Colon cancer Mother   . Prostate cancer Mother   . Heart disease Father   . Hyperlipidemia Father   . Colon cancer Maternal Grandmother   . Pancreatic cancer Sister      Past Surgical History:  Procedure Laterality Date  . ATRIAL FIBRILLATION ABLATION  07/2010   Afib ablation by Dr Roxan Hockey at [facility] in 2012  . BALLOON DILATION N/A 07/31/2016   Procedure: BALLOON DILATION;  Surgeon: Manus Gunning, MD;  Location: [facility] ENDOSCOPY;  Service: Gastroenterology;  Laterality: N/A;  . BALLOON DILATION N/A 08/27/2016   Procedure: BALLOON DILATION;  Surgeon: Manus Gunning, MD;  Location: [facility]OSCOPY;  Service: Gastroenterology;  Laterality: N/A;  . CARDIAC CATHETERIZATION    . CARDIAC CATHETERIZATION N/A 08/17/2015   Procedure: Left Heart Cath and Coronary Angiography;  Surgeon: Larey Dresser, MD;  Location: [place] CV LAB;  Service: Cardiovascular;  Laterality: N/A;  . COLONOSCOPY W/ POLYPECTOMY  06/26/2016  . CORONARY STENT PLACEMENT     x 2  . EP IMPLANTABLE DEVICE N/A 03/09/2015   Procedure: Loop Recorder Removal;  Surgeon: [name], MD;  Location: [place] CV LAB;  Service: Cardiovascular;  Laterality: N/A;  . ESOPHAGOGASTRODUODENOSCOPY N/A 07/31/2016   Procedure: ESOPHAGOGASTRODUODENOSCOPY (EGD) also  have pediatric scope available;  Surgeon: Manus Gunning, MD;  Location: [facility];  Service: Gastroenterology;  Laterality: N/A;  . ESOPHAGOGASTRODUODENOSCOPY N/A 08/27/2016   Procedure: ESOPHAGOGASTRODUODENOSCOPY (EGD);  Surgeon: Manus Gunning, MD;  Location: [facility];  Service: Gastroenterology;  Laterality: N/A;  . implantable loop recorder implantation  08/13/2010   MDT Reveal XT implanted by Dr Roxan Hockey for afib management post ablation  . LEFT HEART CATH N/A 07/15/2013   Procedure: LEFT HEART CATH;  Surgeon: Clent Demark, MD;  Location: [facility] CATH LAB;  Service: Cardiovascular;  Laterality: N/A;  . loop recorder removed  2017  . RADIOACTIVE SEED IMPLANT N/A 11/27/2017   Procedure: RADIOACTIVE SEED IMPLANT/BRACHYTHERAPY IMPLANT;  Surgeon: Kathie Rhodes, MD;  Location: [facility];  Service: Urology;  Laterality: N/A;  . SPACE OAR INSTILLATION N/A 11/27/2017   Procedure: SPACE OAR INSTILLATION;  Surgeon: Kathie Rhodes, MD;  Location: [facility];  Service: Urology;  Laterality: N/A;  . TONSILLECTOMY  age 34    Social History   Socioeconomic History  . Marital status: Married    Spouse name: Not on file  . Number of children: 1  . Years of education: Not on file  . Highest education level: Not on file  Occupational History  . Occupation: retired  Tobacco Use  . Smoking status: Former Smoker    Packs/day: 0.25    Years: 30.00    Pack years: 7.50    Types: Cigarettes    Quit date: 07/21/2003    Years since quitting: 15.7  . Smokeless tobacco: Never Used  Substance and Sexual Activity  . Alcohol use: Yes    Alcohol/week: 1.0 standard drinks    Types: 1 Glasses of wine per week    Comment: occasional wine or beer  . Drug use: No    Comment: marijuana use in past several yrs ago  . Sexual activity: Yes  Other Topics Concern  . Not on file  Social History Narrative   Pt lives in [place] with wife.  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  One son.    Social Determinants of Health   Financial Resource Strain:   . Difficulty of Paying Living Expenses:   Food Insecurity:   . Worried About Charity fundraiser in the Last Year:   . Arboriculturist in the Last Year:   Transportation Needs:   . Film/video editor (Medical):   Marland Kitchen Lack of Transportation (Non-Medical):   Physical Activity:   . Days of Exercise per Week:   . Minutes of Exercise per Session:   Stress:   . Feeling of Stress :   Social Connections:   .  Frequency of Communication with Friends and Family:   . Frequency of Social Gatherings with Friends and Family:   . Attends Religious Services:   . Active Member of Clubs or Organizations:   . Attends Archivist Meetings:   Marland Kitchen Marital Status:   Intimate Partner Violence:   . Fear of Current or Ex-Partner:   . Emotionally Abused:   Marland Kitchen Physically Abused:   . Sexually Abused:      Allergies  Allergen Reactions  . Clopidogrel Other (See Comments)    unknown reaction  . Crestor [Rosuvastatin Calcium] Other (See Comments)    Extreme muscular weakness  . Lipitor [Atorvastatin Calcium] Other (See Comments)    Extreme muscular weakness   . Statins Other (See Comments)    Extreme muscular weakness      Outpatient Medications Prior to Visit  Medication Sig Dispense Refill  . acetaminophen (TYLENOL) 325 MG tablet Take 325 mg by mouth every 6 (six) hours as needed (FOR PAIN.).    Marland Kitchen Alirocumab (PRALUENT) 75 MG/ML SOAJ Inject 75 mg into the skin every 14 (fourteen) days. 2 pen 11  . Cholecalciferol (SM VITAMIN D3) 4000 units CAPS Take 4,000 Units by mouth daily.    Alveda Reasons 20 MG TABS tablet TAKE 1 TABLET BY MOUTH DAILY WITH SUPPER 90 tablet 1  . tamsulosin (FLOMAX) 0.4 MG CAPS capsule Take 0.4 mg by mouth daily.     No facility-administered medications prior to visit.    Review of Systems  Constitutional:  Negative for chills, fever, malaise/fatigue and weight loss.  HENT: Negative for hearing loss, sore throat and tinnitus.   Eyes: Negative for blurred vision and double vision.  Respiratory: Positive for shortness of breath. Negative for cough, hemoptysis, sputum production, wheezing and stridor.   Cardiovascular: Negative for chest pain, palpitations, orthopnea, leg swelling and PND.  Gastrointestinal: Negative for abdominal pain, constipation, diarrhea, heartburn, nausea and vomiting.  Genitourinary: Negative for dysuria, hematuria and urgency.  Musculoskeletal: Negative for joint pain and myalgias.  Skin: Negative for itching and rash.  Neurological: Negative for dizziness, tingling, weakness and headaches.  Endo/Heme/Allergies: Negative for environmental allergies. Does not bruise/bleed easily.  Psychiatric/Behavioral: Negative for depression. The patient is not nervous/anxious and does not have insomnia.   All other systems reviewed and are negative.    Objective:  Physical Exam Vitals reviewed.  Constitutional:      General: He is not in acute distress.    Appearance: He is well-developed.  HENT:     Head: Normocephalic and atraumatic.  Eyes:     General: No scleral icterus.    Conjunctiva/sclera: Conjunctivae normal.     Pupils: Pupils are equal, round, and reactive to light.  Neck:     Vascular: No JVD.     Trachea: No tracheal deviation.  Cardiovascular:     Rate and Rhythm: Normal rate and regular rhythm.     Heart sounds: Normal heart sounds. No murmur.  Pulmonary:     Effort: Pulmonary effort is normal. No tachypnea, accessory muscle usage or respiratory distress.     Breath sounds: Normal breath sounds. No stridor. No wheezing, rhonchi or rales.  Abdominal:     General: Bowel sounds are normal. There is no distension.     Palpations: Abdomen is soft.     Tenderness: There is no abdominal tenderness.  Musculoskeletal:        General: No tenderness.     Cervical  back: Neck supple.  Lymphadenopathy:  Cervical: No cervical adenopathy.  Skin:    General: Skin is warm and dry.     Capillary Refill: Capillary refill takes less than 2 seconds.     Findings: No rash.  Neurological:     Mental Status: He is alert and oriented to person, place, and time.  Psychiatric:        Behavior: Behavior normal.      Vitals:   04/27/19 1617  BP: 116/60  Pulse: 66  Temp: (!) 97.1 F (36.2 C)  TempSrc: Temporal  SpO2: 97%  Weight: 163 lb 9.6 oz (74.2 kg)  Height: 5\' 10"  (1.778 m)   97% on RA BMI Readings from Last 3 Encounters:  04/27/19 23.47 kg/m  10/06/18 23.96 kg/m  08/24/18 23.96 kg/m   Wt Readings from Last 3 Encounters:  04/27/19 163 lb 9.6 oz (74.2 kg)  10/06/18 167 lb (75.8 kg)  08/24/18 167 lb (75.8 kg)     CBC    Component Value Date/Time   WBC 6.7 07/07/2018 0834   WBC 7.2 11/20/2017 1351   RBC 4.63 07/07/2018 0834   RBC 4.57 11/20/2017 1351   HGB 14.4 07/07/2018 0834   HCT 44.0 07/07/2018 0834   PLT 196 07/07/2018 0834   MCV 95 07/07/2018 0834   MCH 31.1 07/07/2018 0834   MCH 31.3 11/20/2017 1351   MCHC 32.7 07/07/2018 0834   MCHC 31.6 11/20/2017 1351   RDW 14.3 07/07/2018 0834   LYMPHSABS 2.1 07/07/2018 0834   MONOABS 680 11/27/2015 1636   EOSABS 0.4 07/07/2018 0834   BASOSABS 0.1 07/07/2018 0834    Chest Imaging: 08/18/2017 CT chest: 6 mm nodular density right upper lobe, 5 mm right middle lobe nodule stable.  Three-vessel coronary disease.  Small mediastinal nodes.The patient's images have been independently reviewed by me.    Pulmonary Functions Testing Results: No flowsheet data found.  FeNO: none  Pathology: none  Echocardiogram: none  Heart Catheterization: none    Assessment & Plan:     ICD-10-CM   1. Lung nodule  R91.1 CT CHEST WO CONTRAST    Pulmonary Function Test  2. Former smoker  Z87.891   3. DOE (dyspnea on exertion)  R06.00     Discussion:  This is a 74 year old gentleman  former smoker.  Had CT imaging in the past with small subcentimeter pulmonary nodules.  Now presents with ongoing progressive dyspnea on exertion.  Patient prior office spirometry with no evidence of obstruction.  Plan: We will complete full PFTs upon return to the office. Complete noncontrasted CT imaging of the chest to look for change and nodules. Encouraged ongoing return to his exercise routine. Hold on initiation of any inhalers at this time.  I will likely be out of the office in approximate 6 weeks at the time of his return. Patient can follow-up with one of our nurse practitioners to review CT imaging and PFTs at this time. May need to consider inhaler regimen at some point.  Greater than 50% of this patient's 46-minute office was spent face-to-face discussing above recommendations and treatment plan.  Review and independent interpretation of patient's CT imaging.  As well as review of office-based spirometry.   Current Outpatient Medications:  .  acetaminophen (TYLENOL) 325 MG tablet, Take 325 mg by mouth every 6 (six) hours as needed (FOR PAIN.)., Disp: , Rfl:  .  Alirocumab (PRALUENT) 75 MG/ML SOAJ, Inject 75 mg into the skin every 14 (fourteen) days., Disp: 2 pen, Rfl: 11 .  Cholecalciferol (SM  VITAMIN D3) 4000 units CAPS, Take 4,000 Units by mouth daily., Disp: , Rfl:  .  XARELTO 20 MG TABS tablet, TAKE 1 TABLET BY MOUTH DAILY WITH SUPPER, Disp: 90 tablet, Rfl: 1   Garner Nash, DO [facility] 04/27/2019 4:33 PM

## 2019-04-27 NOTE — Patient Instructions (Addendum)
Thank you for visiting Dr. Valeta Harms at [facility]. Today we recommend the following:  Orders Placed This Encounter  Procedures  . CT CHEST WO CONTRAST  . Pulmonary Function Test   Please schedule ct and PFT prior to next office visit.  Return in about 6 weeks (around 06/08/2019) for with APP.    Please do your part to reduce the spread of COVID-19.

## 2019-04-28 ENCOUNTER — Encounter: Payer: Self-pay | Admitting: Family Medicine

## 2019-04-28 ENCOUNTER — Ambulatory Visit: Payer: Medicare PPO | Admitting: Family Medicine

## 2019-04-28 VITALS — BP 123/80 | HR 51 | Temp 97.0°F | Ht 70.0 in | Wt 164.0 lb

## 2019-04-28 DIAGNOSIS — I714 Abdominal aortic aneurysm, without rupture, unspecified: Secondary | ICD-10-CM

## 2019-04-28 DIAGNOSIS — E7800 Pure hypercholesterolemia, unspecified: Secondary | ICD-10-CM

## 2019-04-28 DIAGNOSIS — I480 Paroxysmal atrial fibrillation: Secondary | ICD-10-CM

## 2019-04-28 DIAGNOSIS — Z1159 Encounter for screening for other viral diseases: Secondary | ICD-10-CM

## 2019-04-28 NOTE — Addendum Note (Signed)
Addended by: Caryl Pina on: 04/28/2019 01:55 PM   Modules accepted: Orders

## 2019-04-28 NOTE — Progress Notes (Signed)
BP 123/80   Pulse (!) 51   Temp (!) 97 F (36.1 C) (Temporal)   Ht 5' 10"  (1.778 m)   Wt 164 lb (74.4 kg)   BMI 23.53 kg/m    Subjective:   Patient ID: Dustin Bauer, male    DOB: November 14, 1945, 74 y.o.   MRN: [id number]  HPI: Dustin Bauer is a 74 y.o. male presenting on 04/28/2019 for Medical Management of Chronic Issues   HPI Paroxysmal A. Fib Patient is coming in for recheck of paroxysmal A. fib.  He does have a cardiologist and is currently on Xarelto.  He also has a history of CAD and hyperlipidemia and currently takes Praluent and also sees cardiology for this as well.  Hyperlipidemia Patient is coming in for recheck of his hyperlipidemia. The patient is currently taking Praluent. They deny any issues with myalgias or history of liver damage from it. They deny any focal numbness or weakness or chest pain.   Relevant past medical, surgical, family and social history reviewed and updated as indicated. Interim medical history since our last visit reviewed. Allergies and medications reviewed and updated.  Review of Systems  Constitutional: Negative for chills and fever.  HENT: Negative for ear pain and tinnitus.   Eyes: Negative for pain and discharge.  Respiratory: Negative for cough, shortness of breath and wheezing.   Cardiovascular: Negative for chest pain, palpitations and leg swelling.  Gastrointestinal: Negative for abdominal pain, blood in stool, constipation and diarrhea.  Genitourinary: Negative for dysuria and hematuria.  Musculoskeletal: Negative for back pain, gait problem and myalgias.  Skin: Negative for rash.  Neurological: Negative for dizziness, weakness and headaches.  Psychiatric/Behavioral: Negative for suicidal ideas.  All other systems reviewed and are negative.   Per HPI unless specifically indicated above   Allergies as of 04/28/2019      Reactions   Clopidogrel Other (See Comments)   unknown reaction   Crestor [rosuvastatin Calcium] Other  (See Comments)   Extreme muscular weakness   Lipitor [atorvastatin Calcium] Other (See Comments)   Extreme muscular weakness   Statins Other (See Comments)   Extreme muscular weakness      Medication List       Accurate as of April 28, 2019  1:31 PM. If you have any questions, ask your nurse or doctor.        acetaminophen 325 MG tablet Commonly known as: TYLENOL Take 325 mg by mouth every 6 (six) hours as needed (FOR PAIN.).   Praluent 75 MG/ML Soaj Generic drug: Alirocumab Inject 75 mg into the skin every 14 (fourteen) days.   SM Vitamin D3 100 MCG (4000 UT) Caps Generic drug: Cholecalciferol Take 4,000 Units by mouth daily.   Xarelto 20 MG Tabs tablet Generic drug: rivaroxaban TAKE 1 TABLET BY MOUTH DAILY WITH SUPPER        Objective:   BP 123/80   Pulse (!) 51   Temp (!) 97 F (36.1 C) (Temporal)   Ht 5' 10"  (1.778 m)   Wt 164 lb (74.4 kg)   BMI 23.53 kg/m   Wt Readings from Last 3 Encounters:  04/28/19 164 lb (74.4 kg)  04/27/19 163 lb 9.6 oz (74.2 kg)  10/06/18 167 lb (75.8 kg)    Physical Exam Vitals and nursing note reviewed.  Constitutional:      General: He is not in acute distress.    Appearance: He is well-developed. He is not diaphoretic.  Eyes:     General: No  scleral icterus.       Right eye: No discharge.     Conjunctiva/sclera: Conjunctivae normal.     Pupils: Pupils are equal, round, and reactive to light.  Neck:     Thyroid: No thyromegaly.  Cardiovascular:     Rate and Rhythm: Normal rate and regular rhythm.     Heart sounds: Normal heart sounds. No murmur.  Pulmonary:     Effort: Pulmonary effort is normal. No respiratory distress.     Breath sounds: Normal breath sounds. No wheezing.  Musculoskeletal:        General: Normal range of motion.     Cervical back: Neck supple.  Lymphadenopathy:     Cervical: No cervical adenopathy.  Skin:    General: Skin is warm and dry.     Findings: No rash.  Neurological:     Mental  Status: He is alert and oriented to person, place, and time.     Coordination: Coordination normal.  Psychiatric:        Behavior: Behavior normal.       Assessment & Plan:   Problem List Items Addressed This Visit      Cardiovascular and Mediastinum   Paroxysmal atrial fibrillation (HCC)   Relevant Orders   CBC with Differential/Platelet   CMP14+EGFR   AAA (abdominal aortic aneurysm) without rupture (HCC)     Other   Hyperlipidemia - Primary   Relevant Orders   Lipid panel    Other Visit Diagnoses    Need for hepatitis C screening test       Relevant Orders   Hepatitis C antibody      Continue current medication, continue see urology and cardiology. Follow up plan: Return in about 6 months (around 10/28/2019), or if symptoms worsen or fail to improve, for Hyperlipidemia and A. fib recheck.  Counseling provided for all of the vaccine components No orders of the defined types were placed in this encounter.   Caryl Pina, MD [facility] 04/28/2019, 1:31 PM

## 2019-05-05 ENCOUNTER — Ambulatory Visit (INDEPENDENT_AMBULATORY_CARE_PROVIDER_SITE_OTHER)
Admission: RE | Admit: 2019-05-05 | Discharge: 2019-05-05 | Disposition: A | Discharge status: Home / Self Care | Payer: Medicare PPO | Source: Ambulatory Visit | Attending: Pulmonary Disease | Admitting: Pulmonary Disease

## 2019-05-05 ENCOUNTER — Other Ambulatory Visit: Payer: Self-pay

## 2019-05-05 DIAGNOSIS — R911 Solitary pulmonary nodule: Secondary | ICD-10-CM

## 2019-05-06 ENCOUNTER — Other Ambulatory Visit: Payer: Self-pay

## 2019-05-06 ENCOUNTER — Other Ambulatory Visit: Payer: Medicare PPO

## 2019-05-06 DIAGNOSIS — E7800 Pure hypercholesterolemia, unspecified: Secondary | ICD-10-CM

## 2019-05-06 DIAGNOSIS — I480 Paroxysmal atrial fibrillation: Secondary | ICD-10-CM

## 2019-05-06 DIAGNOSIS — Z1159 Encounter for screening for other viral diseases: Secondary | ICD-10-CM

## 2019-05-07 LAB — LIPID PANEL
Chol/HDL Ratio: 1.9 ratio (ref 0.0–5.0)
Cholesterol, Total: 105 mg/dL (ref 100–199)
HDL: 54 mg/dL (ref >39)
LDL Chol Calc (NIH): 39 mg/dL (ref 0–99)
Triglycerides: 49 mg/dL (ref 0–149)
VLDL Cholesterol Cal: 12 mg/dL (ref 5–40)

## 2019-05-07 LAB — CBC WITH DIFFERENTIAL/PLATELET
Basophils Absolute: 0 10*3/uL (ref 0.0–0.2)
Basos: 1 %
EOS (ABSOLUTE): 0.4 10*3/uL (ref 0.0–0.4)
Eos: 5 %
Hematocrit: 43.1 % (ref 37.5–51.0)
Hemoglobin: 14.6 g/dL (ref 13.0–17.7)
Immature Grans (Abs): 0 10*3/uL (ref 0.0–0.1)
Immature Granulocytes: 0 %
Lymphocytes Absolute: 2.4 10*3/uL (ref 0.7–3.1)
Lymphs: 31 %
MCH: 32.3 pg (ref 26.6–33.0)
MCHC: 33.9 g/dL (ref 31.5–35.7)
MCV: 95 fL (ref 79–97)
Monocytes Absolute: 0.8 10*3/uL (ref 0.1–0.9)
Monocytes: 10 %
Neutrophils Absolute: 4.1 10*3/uL (ref 1.4–7.0)
Neutrophils: 53 %
Platelets: 203 10*3/uL (ref 150–450)
RBC: 4.52 x10E6/uL (ref 4.14–5.80)
RDW: 13.9 % (ref 11.6–15.4)
WBC: 7.7 10*3/uL (ref 3.4–10.8)

## 2019-05-07 LAB — CMP14+EGFR
ALT: 15 IU/L (ref 0–44)
AST: 20 IU/L (ref 0–40)
Albumin/Globulin Ratio: 2.1 (ref 1.2–2.2)
Albumin: 4.2 g/dL (ref 3.7–4.7)
Alkaline Phosphatase: 91 IU/L (ref 39–117)
BUN/Creatinine Ratio: 16 (ref 10–24)
BUN: 16 mg/dL (ref 8–27)
Bilirubin Total: 0.4 mg/dL (ref 0.0–1.2)
CO2: 21 mmol/L (ref 20–29)
Calcium: 9.5 mg/dL (ref 8.6–10.2)
Chloride: 108 mmol/L — ABNORMAL HIGH (ref 96–106)
Creatinine, Ser: 1.03 mg/dL (ref 0.76–1.27)
GFR calc Af Amer: 82 mL/min/{1.73_m2} (ref >59)
GFR calc non Af Amer: 71 mL/min/{1.73_m2} (ref >59)
Globulin, Total: 2 g/dL (ref 1.5–4.5)
Glucose: 97 mg/dL (ref 65–99)
Potassium: 4.3 mmol/L (ref 3.5–5.2)
Sodium: 143 mmol/L (ref 134–144)
Total Protein: 6.2 g/dL (ref 6.0–8.5)

## 2019-05-07 LAB — HEPATITIS C ANTIBODY: Hep C Virus Ab: <0.1 s/co ratio (ref 0.0–0.9)

## 2019-05-09 ENCOUNTER — Encounter: Payer: Self-pay | Admitting: Family Medicine

## 2019-05-10 ENCOUNTER — Telehealth: Payer: Self-pay | Admitting: Pulmonary Disease

## 2019-05-10 DIAGNOSIS — R918 Other nonspecific abnormal finding of lung field: Secondary | ICD-10-CM

## 2019-05-10 NOTE — Telephone Encounter (Signed)
Spoke with patient. He was requesting the results from his CT scan on April 22nd. I advised him that the results have been loaded to Epic and I will send a message over to [facility] for him for his results. He verbalized understanding.   Dr. Valeta Harms, please advise on his CT scan results. Thanks!

## 2019-05-11 NOTE — Telephone Encounter (Signed)
PCCM:   IMPRESSION: 1. Stable 5 mm right middle lobe nodule, previously shown to be stable since 2017. No follow-up needed if patient is low-risk. Non-contrast chest CT can be considered in 12 months if patient is high-risk. This recommendation follows the consensus statement: Guidelines for Management of Incidental Pulmonary Nodules Detected on CT Images: From the Fleischner Society 2017; Radiology 2017; 284:228-243. 2. 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 3. Calcific coronary artery and aortic atherosclerosis.  This has been stable. We just need to decided if he wants continued observation or not. He is considered high risk for development of lung cancer due to smoking history. So it would just be screening at this point. If he wants another we would plan for a one year non contrasted ct   Garner Nash, DO [facility] 05/11/2019 4:13 PM

## 2019-05-11 NOTE — Telephone Encounter (Signed)
Called and spoke to patient.  He wants to follow up with a CT in 1 year Order placed.  Nothing further needed at this time

## 2019-06-07 ENCOUNTER — Other Ambulatory Visit (HOSPITAL_COMMUNITY)
Admission: RE | Admit: 2019-06-07 | Discharge: 2019-06-07 | Disposition: A | Discharge status: Home / Self Care | Payer: Medicare PPO | Source: Ambulatory Visit | Attending: Primary Care | Admitting: Primary Care

## 2019-06-07 DIAGNOSIS — Z20822 Contact with and (suspected) exposure to covid-19: Secondary | ICD-10-CM | POA: Insufficient documentation

## 2019-06-07 DIAGNOSIS — Z01812 Encounter for preprocedural laboratory examination: Secondary | ICD-10-CM | POA: Diagnosis present

## 2019-06-07 LAB — SARS CORONAVIRUS 2 (TAT 6-24 HRS): SARS Coronavirus 2: NEGATIVE

## 2019-06-10 ENCOUNTER — Ambulatory Visit: Payer: Medicare PPO | Admitting: Primary Care

## 2019-06-10 ENCOUNTER — Encounter: Payer: Self-pay | Admitting: Primary Care

## 2019-06-10 ENCOUNTER — Ambulatory Visit (INDEPENDENT_AMBULATORY_CARE_PROVIDER_SITE_OTHER): Payer: Medicare PPO | Admitting: Pulmonary Disease

## 2019-06-10 ENCOUNTER — Other Ambulatory Visit: Payer: Self-pay

## 2019-06-10 DIAGNOSIS — R911 Solitary pulmonary nodule: Secondary | ICD-10-CM

## 2019-06-10 DIAGNOSIS — R0600 Dyspnea, unspecified: Secondary | ICD-10-CM

## 2019-06-10 DIAGNOSIS — R918 Other nonspecific abnormal finding of lung field: Secondary | ICD-10-CM

## 2019-06-10 DIAGNOSIS — R0609 Other forms of dyspnea: Secondary | ICD-10-CM

## 2019-06-10 LAB — PULMONARY FUNCTION TEST
DL/VA % pred: 76 %
DL/VA: 3.06 ml/min/mmHg/L
DLCO cor % pred: 81 %
DLCO cor: 19.86 ml/min/mmHg
DLCO unc % pred: 81 %
DLCO unc: 19.86 ml/min/mmHg
FEF 25-75 Post: 3.25 L/sec
FEF 25-75 Pre: 1.99 L/sec
FEF2575-%Change-Post: 63 %
FEF2575-%Pred-Post: 148 %
FEF2575-%Pred-Pre: 91 %
FEV1-%Change-Post: 10 %
FEV1-%Pred-Post: 106 %
FEV1-%Pred-Pre: 96 %
FEV1-Post: 3.17 L
FEV1-Pre: 2.88 L
FEV1FVC-%Change-Post: 1 %
FEV1FVC-%Pred-Pre: 100 %
FEV6-%Change-Post: 8 %
FEV6-%Pred-Post: 108 %
FEV6-%Pred-Pre: 100 %
FEV6-Post: 4.19 L
FEV6-Pre: 3.85 L
FEV6FVC-%Change-Post: 0 %
FEV6FVC-%Pred-Post: 104 %
FEV6FVC-%Pred-Pre: 104 %
FVC-%Change-Post: 8 %
FVC-%Pred-Post: 103 %
FVC-%Pred-Pre: 95 %
FVC-Post: 4.27 L
FVC-Pre: 3.93 L
Post FEV1/FVC ratio: 74 %
Post FEV6/FVC ratio: 98 %
Pre FEV1/FVC ratio: 73 %
Pre FEV6/FVC Ratio: 98 %
RV % pred: 84 %
RV: 2.08 L
TLC % pred: 89 %
TLC: 6.12 L

## 2019-06-10 MED ORDER — LEVALBUTEROL TARTRATE 45 MCG/ACT IN AERO: 1.0000 | INHALATION_SPRAY | Freq: Four times a day (QID) | RESPIRATORY_TRACT | 3 refills | Status: DC | PRN

## 2019-06-10 NOTE — Progress Notes (Signed)
@Patient  ID: Dustin Bauer, male    DOB: 08/24/1945, 74 y.o.   MRN: [id number]  Chief Complaint  Patient presents with  . Follow-up    Had a PFT; SOB on exertion and lightheadedness    Referring provider: Dettinger, Fransisca Kaufmann, MD  HPI: 74 year old male, former smoker quit smoking 15-20 years ago (approx 7.5 pack year hx) . PMH significant for lung nodule, triple AAA. Patient of Dr. Valeta Harms, seen for new patient consult on 04/27/19 for progressive dyspnea on exertion (previous patient of Dr. Lake Bells). He has had CT imaging in the past with small sub-centimetere pulmonary nodules. Prior spirometry showed no evidence of obstruction.   06/10/2019 Patient presents today for follow-up visit with PFTs and CT chest. He is doing well. Reports that over the last few years pollen has bothered him. He had an intermittent cough and wheezing this spring. States that his symptoms are not as bad now. He does notice that when he exercises he sometimes feels as though he is not getting enough oxygen. He also has associated fatigue with exercise. He denies chest pain or pressure.   Imaging: 05/05/19 CT chest wo contrast- stable 3mm right middle lobe nodule since 2017. No follow-up needed if patient is low risk. Non contrast chest CT can be considered in 12 months if high risk. Stable midl aneurysmal dilatation of left lower lobe pulmonary aretwery bracnch.  PFTs: 06/10/2019- FVC 4.27 (103%), FEV1 3.17 (106%), ratio 74, mid-flow reversibility, DLCOunc 19.86 (81%)  Allergies  Allergen Reactions  . Clopidogrel Other (See Comments)    unknown reaction  . Crestor [Rosuvastatin Calcium] Other (See Comments)    Extreme muscular weakness  . Lipitor [Atorvastatin Calcium] Other (See Comments)    Extreme muscular weakness   . Statins Other (See Comments)    Extreme muscular weakness     Immunization History  Administered Date(s) Administered  . Fluad Quad(high Dose 65+) 11/04/2018  . Influenza, High Dose  Seasonal PF 12/04/2015, 12/10/2016, 11/03/2017  . Influenza,inj,Quad PF,6+ Mos 11/29/2012, 12/04/2014  . Moderna SARS-COVID-2 Vaccination 02/16/2019, 03/15/2019  . Pneumococcal Conjugate-13 11/29/2012  . Pneumococcal Polysaccharide-23 02/13/2011  . Tdap 11/04/2010  . Zoster 06/30/2011    Past Medical History:  Diagnosis Date  . AAA (abdominal aortic aneurysm) ([place]) 01/20/2017   3.9cm infrarenal  . Allergy to ACE inhibitors    Cough and fatigue with ACEI  . Aortic atherosclerosis ([place]) 08/18/2017   Noted on CT chest  . CAD (coronary artery disease) stent x 1 2011 and 1  2014   a. LHC in 2011 for stable angina >> DES to pLAD [facility])  //  b. inf-post STEMI 7/15 c/b CHB with jxn escape >> LHC: OM1 60-70, pLAD 30-40, m-d RCA occluded, EF 50-55 >> PCI: 3 x 33 mm Xience DES to RCA  //  c. LHC 8/17: dLM 20, pLAD 20, pLAD stent ok, pRI 30, pRCA stent ok with 30% ISR  . Colon polyps   . Diverticulosis   . Emphysema lung ([place]) 08/18/2017   Noted on CT chest  . Gastric stenosis 08/27/2016   at pylorus, noted on endoscopy  . GERD (gastroesophageal reflux disease)   . Hydrocele, bilateral 01/30/2017   Noted on CT chest  . Hyperlipidemia    Myalgias with Zocor, Crestor, atorvastatin.   . Nicotine abuse   . PAF (paroxysmal atrial fibrillation) [facility])    s/p ablation by Dr Roxan Hockey [facility] in 2012 // Xarelto anticoagulation  //  ILR removed 2/17 //  Event Monitor 11/17: Predominantly NSR (90%). Paroxysmal atrial fibrillation (10%).  Atrial fibrillation appears to be rate-controlled when it occurs.   . Prostate cancer ([place]) dx 2019  . Pulmonary nodule, right 08/18/2017   6 mm right upper lobe, 5.5 mm right middle lobe, Noted on CT Chest  . Pyloric stenosis   . Thoracic ascending aortic aneurysm ([place]) 01/30/2017   3.9 cm    Tobacco History: Social History   Tobacco Use  Smoking Status Former Smoker  . Packs/day: 0.25  . Years: 30.00  . Pack years: 7.50  . Types: Cigarettes  . Quit  date: 07/21/2003  . Years since quitting: 15.9  Smokeless Tobacco Never Used   Counseling given: Not Answered   Outpatient Medications Prior to Visit  Medication Sig Dispense Refill  . acetaminophen (TYLENOL) 325 MG tablet Take 325 mg by mouth every 6 (six) hours as needed (FOR PAIN.).    Marland Kitchen Alirocumab (PRALUENT) 75 MG/ML SOAJ Inject 75 mg into the skin every 14 (fourteen) days. 2 pen 11  . Cholecalciferol (SM VITAMIN D3) 4000 units CAPS Take 4,000 Units by mouth daily.    Alveda Reasons 20 MG TABS tablet TAKE 1 TABLET BY MOUTH DAILY WITH SUPPER 90 tablet 1   No facility-administered medications prior to visit.   Review of Systems  Review of Systems  Constitutional: Positive for fatigue.  Respiratory: Positive for shortness of breath. Negative for cough, chest tightness and wheezing.        Dyspnea on exertion   Physical Exam  BP 130/74   Pulse (!) 52   Temp 97.9 F (36.6 C) (Oral)   Ht 5\' 9"  (1.753 m)   Wt 159 lb 12.8 oz (72.5 kg)   SpO2 96%   BMI 23.60 kg/m  Physical Exam Constitutional:      Appearance: Normal appearance.  HENT:     Head: Normocephalic and atraumatic.     Right Ear: Tympanic membrane normal.     Left Ear: Tympanic membrane normal.     Mouth/Throat:     Mouth: Mucous membranes are moist.     Pharynx: Oropharynx is clear.  Cardiovascular:     Rate and Rhythm: Normal rate and regular rhythm.  Pulmonary:     Effort: Pulmonary effort is normal.     Breath sounds: Normal breath sounds. No wheezing or rhonchi.  Musculoskeletal:        General: Normal range of motion.  Skin:    Comments: Scattered bruising to BUE  Neurological:     General: No focal deficit present.     Mental Status: He is alert and oriented to person, place, and time. Mental status is at baseline.  Psychiatric:        Mood and Affect: Mood normal.        Behavior: Behavior normal.        Thought Content: Thought content normal.      Lab Results:  CBC    Component Value  Date/Time   WBC 7.7 05/06/2019 0947   WBC 7.2 11/20/2017 1351   RBC 4.52 05/06/2019 0947   RBC 4.57 11/20/2017 1351   HGB 14.6 05/06/2019 0947   HCT 43.1 05/06/2019 0947   PLT 203 05/06/2019 0947   MCV 95 05/06/2019 0947   MCH 32.3 05/06/2019 0947   MCH 31.3 11/20/2017 1351   MCHC 33.9 05/06/2019 0947   MCHC 31.6 11/20/2017 1351   RDW 13.9 05/06/2019 0947   LYMPHSABS 2.4 05/06/2019 0947   MONOABS 680 11/27/2015  1636   EOSABS 0.4 05/06/2019 0947   BASOSABS 0.0 05/06/2019 0947    BMET    Component Value Date/Time   NA 143 05/06/2019 0947   K 4.3 05/06/2019 0947   CL 108 (H) 05/06/2019 0947   CO2 21 05/06/2019 0947   GLUCOSE 97 05/06/2019 0947   GLUCOSE 108 (H) 11/20/2017 1351   BUN 16 05/06/2019 0947   CREATININE 1.03 05/06/2019 0947   CREATININE 1.01 11/27/2015 1636   CALCIUM 9.5 05/06/2019 0947   GFRNONAA 71 05/06/2019 0947   GFRNONAA 73 06/24/2012 1627   GFRAA 82 05/06/2019 0947   GFRAA 84 06/24/2012 1627    BNP No results found for: BNP  ProBNP No results found for: PROBNP  Imaging: No results found.   Assessment & Plan:   Dyspnea on exertion - Dyspnea and fatigue with exercise  - Pulmonary function testing showed no evidence of airway obstruction, borderline bronchodilator response especially in mid-flow/small airways (NORMAL spirometry does not rule out underlying asthma) - Rx: Trial Xopenex rescue inhaler (this is a short acting bronchodilator) - use 1-2 puffs every 6 hours as needed for shortness of breath or wheezing  - Follow-up: Recommend patient follow-up with Cardiology, consider exercise stress test AND in 3 months with Dr. Valeta Harms     Multiple pulmonary nodules - CT chest 05/05/19 showed small amount of parenchymal scarring in the right upper lobe and stable 40mm right middle lobe nodule since 2017, considered benign and no additional follow-up is needed as patient is low risk (former light smoker, pack year hx <10 years)   Martyn Ehrich,  NP 06/22/2019

## 2019-06-10 NOTE — Progress Notes (Signed)
PFT done today. 

## 2019-06-10 NOTE — Patient Instructions (Addendum)
Testing: - CT chest showed stable 14mm right middle lobe nodule since 2017, considered benign  - Pulmonary function testing showed no evidence of airway obstruction, borderline bronchodilator response especially in mid-flows/small airways (possible underlying asthma)  Recommend: - Follow up with Cardiology, consider exercise stress test  Rx: - Trial Xopenex rescue inhaler (this is a short acting bronchodilator) - use 1-2 puffs every 6 hours as needed for shortness of breath or wheezing   Follow-up: - 3 months with Dr. Valeta Harms

## 2019-06-22 NOTE — Assessment & Plan Note (Addendum)
-   CT chest 05/05/19 showed small amount of parenchymal scarring in the right upper lobe and stable 5mm right middle lobe nodule since 2017, considered benign and no additional follow-up is needed as patient is low risk (former light smoker, pack year hx <10 years)

## 2019-06-22 NOTE — Assessment & Plan Note (Addendum)
-   Dyspnea and fatigue with exercise  - Pulmonary function testing showed no evidence of airway obstruction, borderline bronchodilator response especially in mid-flow/small airways (NORMAL spirometry does not rule out underlying asthma) - Rx: Trial Xopenex rescue inhaler (this is a short acting bronchodilator) - use 1-2 puffs every 6 hours as needed for shortness of breath or wheezing  - Follow-up: Recommend patient follow-up with Cardiology, consider exercise stress test AND in 3 months with Dr. Valeta Harms

## 2019-06-25 NOTE — Progress Notes (Signed)
PCCM: Thanks for seeing. Garner Nash, DO [facility] 06/25/2019 3:17 PM

## 2019-06-27 ENCOUNTER — Other Ambulatory Visit: Payer: Self-pay | Admitting: Family Medicine

## 2019-06-27 MED ORDER — PRALUENT 75 MG/ML ~~LOC~~ SOAJ: 75.0000 mg | SUBCUTANEOUS | 11 refills | Status: DC

## 2019-06-27 NOTE — Telephone Encounter (Signed)
°  Prescription Request  06/27/2019  What is the name of the medication or equipment? [facility]  Have you contacted your pharmacy to request a refill? (if applicable) Yes  Which pharmacy would you like this sent to? [facility]mail[facility]   Patient notified that their request is being sent to the clinical staff for review and that they should receive a response within 2 business days.   Dettinger's pt.  He isn't sure he got this RX done here or cardiologist?  Please call pt.

## 2019-06-30 ENCOUNTER — Telehealth: Payer: Self-pay | Admitting: Family Medicine

## 2019-06-30 MED ORDER — PRALUENT 75 MG/ML ~~LOC~~ SOAJ: 75.0000 mg | SUBCUTANEOUS | 11 refills | Status: DC

## 2019-06-30 NOTE — Telephone Encounter (Signed)
Pt aware med resent to right pharmacy

## 2019-07-26 DIAGNOSIS — H02831 Dermatochalasis of right upper eyelid: Secondary | ICD-10-CM | POA: Diagnosis not present

## 2019-07-26 DIAGNOSIS — H43811 Vitreous degeneration, right eye: Secondary | ICD-10-CM | POA: Diagnosis not present

## 2019-07-26 DIAGNOSIS — H02834 Dermatochalasis of left upper eyelid: Secondary | ICD-10-CM | POA: Diagnosis not present

## 2019-07-26 DIAGNOSIS — H40013 Open angle with borderline findings, low risk, bilateral: Secondary | ICD-10-CM | POA: Diagnosis not present

## 2019-07-26 DIAGNOSIS — H04123 Dry eye syndrome of bilateral lacrimal glands: Secondary | ICD-10-CM | POA: Diagnosis not present

## 2019-07-26 DIAGNOSIS — H2513 Age-related nuclear cataract, bilateral: Secondary | ICD-10-CM | POA: Diagnosis not present

## 2019-08-08 ENCOUNTER — Other Ambulatory Visit: Payer: Self-pay | Admitting: Cardiology

## 2019-09-20 DIAGNOSIS — L2082 Flexural eczema: Secondary | ICD-10-CM | POA: Diagnosis not present

## 2019-09-20 DIAGNOSIS — B078 Other viral warts: Secondary | ICD-10-CM | POA: Diagnosis not present

## 2019-09-20 DIAGNOSIS — L570 Actinic keratosis: Secondary | ICD-10-CM | POA: Diagnosis not present

## 2019-09-20 DIAGNOSIS — L820 Inflamed seborrheic keratosis: Secondary | ICD-10-CM | POA: Diagnosis not present

## 2019-09-20 DIAGNOSIS — D485 Neoplasm of uncertain behavior of skin: Secondary | ICD-10-CM | POA: Diagnosis not present

## 2019-09-20 DIAGNOSIS — L579 Skin changes due to chronic exposure to nonionizing radiation, unspecified: Secondary | ICD-10-CM | POA: Diagnosis not present

## 2019-09-20 DIAGNOSIS — L814 Other melanin hyperpigmentation: Secondary | ICD-10-CM | POA: Diagnosis not present

## 2019-09-20 DIAGNOSIS — Z85828 Personal history of other malignant neoplasm of skin: Secondary | ICD-10-CM | POA: Diagnosis not present

## 2019-10-12 DIAGNOSIS — C61 Malignant neoplasm of prostate: Secondary | ICD-10-CM | POA: Diagnosis not present

## 2019-10-25 DIAGNOSIS — R3915 Urgency of urination: Secondary | ICD-10-CM | POA: Diagnosis not present

## 2019-10-25 DIAGNOSIS — Z8546 Personal history of malignant neoplasm of prostate: Secondary | ICD-10-CM | POA: Diagnosis not present

## 2019-12-02 ENCOUNTER — Other Ambulatory Visit: Payer: Self-pay

## 2019-12-02 ENCOUNTER — Ambulatory Visit: Payer: Medicare PPO | Admitting: Family Medicine

## 2019-12-02 ENCOUNTER — Encounter: Payer: Self-pay | Admitting: Family Medicine

## 2019-12-02 VITALS — BP 148/77 | HR 49 | Temp 97.0°F | Ht 69.0 in | Wt 165.0 lb

## 2019-12-02 DIAGNOSIS — R972 Elevated prostate specific antigen [PSA]: Secondary | ICD-10-CM

## 2019-12-02 DIAGNOSIS — I119 Hypertensive heart disease without heart failure: Secondary | ICD-10-CM | POA: Diagnosis not present

## 2019-12-02 DIAGNOSIS — Z789 Other specified health status: Secondary | ICD-10-CM | POA: Diagnosis not present

## 2019-12-02 DIAGNOSIS — E7800 Pure hypercholesterolemia, unspecified: Secondary | ICD-10-CM

## 2019-12-02 NOTE — Progress Notes (Signed)
BP (!) 148/77   Pulse (!) 49   Temp (!) 97 F (36.1 C)   Ht _0  (1.753 m)   Wt 165 lb (74.8 kg)   SpO2 100%   BMI 24.37 kg/m    Subjective:   Patient ID: Dustin Bauer, male    DOB: Dec 20, 1945, 74 y.o.   MRN: [id number]  HPI: Dustin Bauer is a 74 y.o. male presenting on 12/02/2019 for Medical Management of Chronic Issues   HPI Hypertension Patient is currently on no medication currently has been diet controlled, and their blood pressure today is 148/77. Patient denies any lightheadedness or dizziness. Patient denies headaches, blurred vision, chest pains, shortness of breath, or weakness. Denies any side effects from medication and is content with current medication.   Hyperlipidemia Patient is coming in for recheck of his hyperlipidemia. The patient is currently taking Praluent. They deny any issues with myalgias or history of liver damage from it. They deny any focal numbness or weakness or chest pain.,  Sees cardiology because also has heart block and A. fib and on chronic anticoagulation.  Relevant past medical, surgical, family and social history reviewed and updated as indicated. Interim medical history since our last visit reviewed. Allergies and medications reviewed and updated.  Review of Systems  Constitutional: Negative for chills and fever.  Eyes: Negative for visual disturbance.  Respiratory: Negative for shortness of breath and wheezing.   Cardiovascular: Negative for chest pain and leg swelling.  Musculoskeletal: Negative for back pain and gait problem.  Skin: Negative for rash.  Neurological: Negative for dizziness, weakness and numbness.  All other systems reviewed and are negative.   Per HPI unless specifically indicated above   Allergies as of 12/02/2019      Reactions   Clopidogrel Other (See Comments)   unknown reaction   Crestor [rosuvastatin Calcium] Other (See Comments)   Extreme muscular weakness   Lipitor [atorvastatin Calcium] Other (See  Comments)   Extreme muscular weakness   Statins Other (See Comments)   Extreme muscular weakness      Medication List       Accurate as of December 02, 2019  1:32 PM. If you have any questions, ask your nurse or doctor.        STOP taking these medications   levalbuterol 45 MCG/ACT inhaler Commonly known as: Xopenex HFA Stopped by: Fransisca Kaufmann Teana Lindahl, MD     TAKE these medications   acetaminophen 325 MG tablet Commonly known as: TYLENOL Take 325 mg by mouth every 6 (six) hours as needed (FOR PAIN.).   dextromethorphan-guaiFENesin 30-600 MG 12hr tablet Commonly known as: MUCINEX DM Take 1 tablet by mouth 2 (two) times daily as needed for cough.   Praluent 75 MG/ML Soaj Generic drug: Alirocumab Inject 75 mg into the skin every 14 (fourteen) days.   SM Vitamin D3 100 MCG (4000 UT) Caps Generic drug: Cholecalciferol Take 4,000 Units by mouth daily.   Xarelto 20 MG Tabs tablet Generic drug: rivaroxaban TAKE 1 TABLET BY MOUTH DAILY WITH SUPPER        Objective:   BP (!) 148/77   Pulse (!) 49   Temp (!) 97 F (36.1 C)   Ht _1  (1.753 m)   Wt 165 lb (74.8 kg)   SpO2 100%   BMI 24.37 kg/m   Wt Readings from Last 3 Encounters:  12/02/19 165 lb (74.8 kg)  06/10/19 159 lb 12.8 oz (72.5 kg)  04/28/19 164 lb (74.4 kg)  Physical Exam Vitals and nursing note reviewed.  Constitutional:      General: He is not in acute distress.    Appearance: He is well-developed. He is not diaphoretic.  Eyes:     General: No scleral icterus.    Conjunctiva/sclera: Conjunctivae normal.  Neck:     Thyroid: No thyromegaly.  Cardiovascular:     Rate and Rhythm: Normal rate and regular rhythm.     Heart sounds: Normal heart sounds. No murmur heard.   Pulmonary:     Effort: Pulmonary effort is normal. No respiratory distress.     Breath sounds: Normal breath sounds. No wheezing.  Abdominal:     General: Abdomen is flat. Bowel sounds are normal. There is no distension.      Tenderness: There is no abdominal tenderness. There is no guarding.  Musculoskeletal:        General: Normal range of motion.     Cervical back: Neck supple.  Lymphadenopathy:     Cervical: No cervical adenopathy.  Skin:    General: Skin is warm and dry.     Findings: No rash.  Neurological:     Mental Status: He is alert and oriented to person, place, and time.     Coordination: Coordination normal.  Psychiatric:        Behavior: Behavior normal.       Assessment & Plan:   Problem List Items Addressed This Visit      Cardiovascular and Mediastinum   Hypertensive cardiovascular disease   Relevant Orders   CBC with Differential/Platelet     Other   Hyperlipidemia - Primary   Relevant Orders   CBC with Differential/Platelet   Lipid panel   Statin intolerance   Relevant Orders   CMP14+EGFR   Elevated PSA      Will check blood counts, continue with current medication. Follow up plan: Return in about 6 months (around 05/31/2020), or if symptoms worsen or fail to improve, for Physical.  Counseling provided for all of the vaccine components No orders of the defined types were placed in this encounter.   Caryl Pina, MD [facility] 12/02/2019, 1:32 PM

## 2019-12-06 ENCOUNTER — Other Ambulatory Visit: Payer: Medicare PPO

## 2019-12-06 ENCOUNTER — Other Ambulatory Visit: Payer: Self-pay

## 2019-12-06 DIAGNOSIS — Z789 Other specified health status: Secondary | ICD-10-CM | POA: Diagnosis not present

## 2019-12-06 DIAGNOSIS — I119 Hypertensive heart disease without heart failure: Secondary | ICD-10-CM

## 2019-12-06 DIAGNOSIS — E7800 Pure hypercholesterolemia, unspecified: Secondary | ICD-10-CM

## 2019-12-07 LAB — CMP14+EGFR
ALT: 16 IU/L (ref 0–44)
AST: 17 IU/L (ref 0–40)
Albumin/Globulin Ratio: 2 (ref 1.2–2.2)
Albumin: 4.2 g/dL (ref 3.7–4.7)
Alkaline Phosphatase: 97 IU/L (ref 44–121)
BUN/Creatinine Ratio: 16 (ref 10–24)
BUN: 17 mg/dL (ref 8–27)
Bilirubin Total: 0.4 mg/dL (ref 0.0–1.2)
CO2: 25 mmol/L (ref 20–29)
Calcium: 9.7 mg/dL (ref 8.6–10.2)
Chloride: 106 mmol/L (ref 96–106)
Creatinine, Ser: 1.04 mg/dL (ref 0.76–1.27)
GFR calc Af Amer: 81 mL/min/{1.73_m2} (ref >59)
GFR calc non Af Amer: 70 mL/min/{1.73_m2} (ref >59)
Globulin, Total: 2.1 g/dL (ref 1.5–4.5)
Glucose: 97 mg/dL (ref 65–99)
Potassium: 4.5 mmol/L (ref 3.5–5.2)
Sodium: 143 mmol/L (ref 134–144)
Total Protein: 6.3 g/dL (ref 6.0–8.5)

## 2019-12-07 LAB — CBC WITH DIFFERENTIAL/PLATELET
Basophils Absolute: 0 10*3/uL (ref 0.0–0.2)
Basos: 1 %
EOS (ABSOLUTE): 0.4 10*3/uL (ref 0.0–0.4)
Eos: 6 %
Hematocrit: 41.4 % (ref 37.5–51.0)
Hemoglobin: 14.1 g/dL (ref 13.0–17.7)
Immature Grans (Abs): 0 10*3/uL (ref 0.0–0.1)
Immature Granulocytes: 0 %
Lymphocytes Absolute: 2.1 10*3/uL (ref 0.7–3.1)
Lymphs: 31 %
MCH: 32.3 pg (ref 26.6–33.0)
MCHC: 34.1 g/dL (ref 31.5–35.7)
MCV: 95 fL (ref 79–97)
Monocytes Absolute: 0.6 10*3/uL (ref 0.1–0.9)
Monocytes: 10 %
Neutrophils Absolute: 3.5 10*3/uL (ref 1.4–7.0)
Neutrophils: 52 %
Platelets: 205 10*3/uL (ref 150–450)
RBC: 4.36 x10E6/uL (ref 4.14–5.80)
RDW: 13 % (ref 11.6–15.4)
WBC: 6.8 10*3/uL (ref 3.4–10.8)

## 2019-12-07 LAB — LIPID PANEL
Chol/HDL Ratio: 2 ratio (ref 0.0–5.0)
Cholesterol, Total: 112 mg/dL (ref 100–199)
HDL: 55 mg/dL (ref >39)
LDL Chol Calc (NIH): 46 mg/dL (ref 0–99)
Triglycerides: 42 mg/dL (ref 0–149)
VLDL Cholesterol Cal: 11 mg/dL (ref 5–40)

## 2020-01-05 ENCOUNTER — Telehealth: Payer: Self-pay | Admitting: Family Medicine

## 2020-01-05 NOTE — Telephone Encounter (Signed)
Pt would like to be switched from xarelto to eliquis

## 2020-01-15 NOTE — Telephone Encounter (Signed)
Sorry that is meant to say please ask the patient not that I already asked

## 2020-01-15 NOTE — Telephone Encounter (Signed)
Sorry that is meant to say please ask the patient not that I have already asked

## 2020-01-15 NOTE — Telephone Encounter (Signed)
Asked patient no reason he would like to switch, they are both very similar and work essentially the same, if it is a price or an insurance change for this year and please have him come in for a visit so we can document that we can document the change.

## 2020-01-16 NOTE — Telephone Encounter (Signed)
Appointment scheduled.

## 2020-01-16 NOTE — Telephone Encounter (Signed)
Pt calling back

## 2020-01-16 NOTE — Telephone Encounter (Signed)
Attempted to contact patient - NVM 

## 2020-01-30 ENCOUNTER — Other Ambulatory Visit: Payer: Self-pay

## 2020-01-30 ENCOUNTER — Ambulatory Visit (INDEPENDENT_AMBULATORY_CARE_PROVIDER_SITE_OTHER): Payer: Medicare PPO | Admitting: Family Medicine

## 2020-01-30 ENCOUNTER — Encounter: Payer: Self-pay | Admitting: Family Medicine

## 2020-01-30 DIAGNOSIS — I480 Paroxysmal atrial fibrillation: Secondary | ICD-10-CM

## 2020-01-30 MED ORDER — APIXABAN 5 MG PO TABS: 5.0000 mg | ORAL_TABLET | Freq: Two times a day (BID) | ORAL | 3 refills | Status: DC

## 2020-01-30 NOTE — Progress Notes (Signed)
Virtual Visit via telephone Note  I connected with Dustin Bauer on 01/30/20 at 1110 by telephone and verified that I am speaking with the correct person using two identifiers. Dustin Bauer is currently located at home and patient are currently with her during visit. The provider, Fransisca Kaufmann Fredrica Capano, MD is located in their office at time of visit.  Call ended at 1117  I discussed the limitations, risks, security and privacy concerns of performing an evaluation and management service by telephone and the availability of in person appointments. I also discussed with the patient that there may be a patient responsible charge related to this service. The patient expressed understanding and agreed to proceed.   History and Present Illness: He has a lot of bruising and bleeding with xarelto and would like to switch to eliquis.  He says he is doing a lot of research and read on some studies that the Eliquis has slightly less bruising and bleeding and would like to transition to that, his wife is also taking Eliquis.  He gets the occasional bruising and bleeding and the occasional blood in his stool especially rectal bleeding.  He would like to try to see if he can switch.  1. Paroxysmal atrial fibrillation [facility]     Outpatient Encounter Medications as of 01/30/2020  Medication Sig  . apixaban (ELIQUIS) 5 MG TABS tablet Take 1 tablet (5 mg total) by mouth 2 (two) times daily.  Marland Kitchen acetaminophen (TYLENOL) 325 MG tablet Take 325 mg by mouth every 6 (six) hours as needed (FOR PAIN.).  Marland Kitchen Alirocumab (PRALUENT) 75 MG/ML SOAJ Inject 75 mg into the skin every 14 (fourteen) days.  . Cholecalciferol (SM VITAMIN D3) 4000 units CAPS Take 4,000 Units by mouth daily.  Marland Kitchen dextromethorphan-guaiFENesin (MUCINEX DM) 30-600 MG 12hr tablet Take 1 tablet by mouth 2 (two) times daily as needed for cough.  . [DISCONTINUED] XARELTO 20 MG TABS tablet TAKE 1 TABLET BY MOUTH DAILY WITH SUPPER   No facility-administered  encounter medications on file as of 01/30/2020.    Review of Systems  Constitutional: Negative for chills and fever.  Eyes: Negative for discharge.  Respiratory: Negative for shortness of breath and wheezing.   Cardiovascular: Negative for chest pain and leg swelling.  Gastrointestinal: Positive for blood in stool.  Musculoskeletal: Negative for back pain and gait problem.  Skin: Positive for color change (Bruising). Negative for rash.  All other systems reviewed and are negative.   Observations/Objective: Patient sounds comfortable and in no acute distress  Assessment and Plan: Problem List Items Addressed This Visit      Cardiovascular and Mediastinum   Paroxysmal atrial fibrillation (HCC) - Primary   Relevant Medications   apixaban (ELIQUIS) 5 MG TABS tablet      Will transition from Xarelto to Eliquis, he takes his Xarelto in the evenings so instructed the patient that if he takes his Xarelto dose tonight and needs to start the Eliquis tomorrow night and then go to twice a day after that. Follow up plan: Return in about 6 months (around 07/29/2020), or if symptoms worsen or fail to improve, for Routine visit follow-up.     I discussed the assessment and treatment plan with the patient. The patient was provided an opportunity to ask questions and all were answered. The patient agreed with the plan and demonstrated an understanding of the instructions.   The patient was advised to call back or seek an in-person evaluation if the symptoms worsen or if  the condition fails to improve as anticipated.  The above assessment and management plan was discussed with the patient. The patient verbalized understanding of and has agreed to the management plan. Patient is aware to call the clinic if symptoms persist or worsen. Patient is aware when to return to the clinic for a follow-up visit. Patient educated on when it is appropriate to go to the emergency department.    I provided 7  minutes of non-face-to-face time during this encounter.    Worthy Rancher, MD

## 2020-02-01 ENCOUNTER — Other Ambulatory Visit: Payer: Self-pay | Admitting: Cardiology

## 2020-02-23 ENCOUNTER — Other Ambulatory Visit: Payer: Self-pay

## 2020-02-23 DIAGNOSIS — I714 Abdominal aortic aneurysm, without rupture, unspecified: Secondary | ICD-10-CM

## 2020-02-23 DIAGNOSIS — I701 Atherosclerosis of renal artery: Secondary | ICD-10-CM

## 2020-03-09 ENCOUNTER — Other Ambulatory Visit: Payer: Self-pay

## 2020-03-09 ENCOUNTER — Ambulatory Visit (HOSPITAL_COMMUNITY)
Admission: RE | Admit: 2020-03-09 | Discharge: 2020-03-09 | Disposition: A | Discharge status: Home / Self Care | Payer: Medicare PPO | Source: Ambulatory Visit | Attending: Vascular Surgery | Admitting: Vascular Surgery

## 2020-03-09 ENCOUNTER — Ambulatory Visit: Payer: Medicare PPO | Admitting: Physician Assistant

## 2020-03-09 ENCOUNTER — Ambulatory Visit (INDEPENDENT_AMBULATORY_CARE_PROVIDER_SITE_OTHER)
Admission: RE | Admit: 2020-03-09 | Discharge: 2020-03-09 | Disposition: A | Discharge status: Home / Self Care | Payer: Medicare PPO | Source: Ambulatory Visit | Attending: Vascular Surgery | Admitting: Vascular Surgery

## 2020-03-09 VITALS — BP 147/82 | HR 40 | Temp 97.7°F | Resp 20 | Ht 69.0 in | Wt 160.2 lb

## 2020-03-09 DIAGNOSIS — I701 Atherosclerosis of renal artery: Secondary | ICD-10-CM

## 2020-03-09 DIAGNOSIS — I714 Abdominal aortic aneurysm, without rupture, unspecified: Secondary | ICD-10-CM

## 2020-03-09 NOTE — Progress Notes (Signed)
Office Note     CC:  follow up Requesting Provider:  Dettinger, Fransisca Kaufmann, MD  HPI: Dustin Bauer is a 75 y.o. ([birth year]) male who presents for surveillance follow up of AAA and renal artery stenosis. He has been followed since 2017 for a 3.9 cm AAA that was found on CT. It has been stable since. At his last visit in March of 2020 he was doing well. He had undergone some brachytherapy for prostate cancer but was doing well. His aneurysm on duplex had maximum diameter of 4 cm. Left RAS was about 60% and no intervention was indicated. He was given a 1 year follow up.  He denies any abdominal pain or back pain. He remains very active going to [employer] 2[facility] x per week and at [employer]. He does not have any lower extremity claudication, weakness, rest pain or non healing wounds. His blood pressure remains well controlled. He is not on any medication for this.   He remains on Anticoagulation for PAF. Was recently changed from Xarelto to Eliquis with lower bruising/ bleeding   The pt not on a statin for cholesterol management. Intolerance with myalgias The pt is not on a daily aspirin.   Other AC:  Eliquis The pt is not on for hypertension.   The pt is not diabetic. Tobacco hx:  Former, quit 2005  Past Medical History:  Diagnosis Date  . AAA (abdominal aortic aneurysm) ([place]) 01/20/2017   3.9cm infrarenal  . Allergy to ACE inhibitors    Cough and fatigue with ACEI  . Aortic atherosclerosis ([place]) 08/18/2017   Noted on CT chest  . CAD (coronary artery disease) stent x 1 2011 and 1  2014   a. LHC in 2011 for stable angina >> DES to pLAD [facility])  //  b. inf-post STEMI 7/15 c/b CHB with jxn escape >> LHC: OM1 60-70, pLAD 30-40, m-d RCA occluded, EF 50-55 >> PCI: 3 x 33 mm Xience DES to RCA  //  c. LHC 8/17: dLM 20, pLAD 20, pLAD stent ok, pRI 30, pRCA stent ok with 30% ISR  . Colon polyps   . Diverticulosis   . Emphysema lung ([place]) 08/18/2017   Noted on CT chest  . Gastric stenosis 08/27/2016   at  pylorus, noted on endoscopy  . GERD (gastroesophageal reflux disease)   . Hydrocele, bilateral 01/30/2017   Noted on CT chest  . Hyperlipidemia    Myalgias with Zocor, Crestor, atorvastatin.   . Nicotine abuse   . PAF (paroxysmal atrial fibrillation) [facility])    s/p ablation by Dr Roxan Hockey at [facility] in 2012 // Xarelto anticoagulation  //  ILR removed 2/17 // Event Monitor 11/17: Predominantly NSR (90%). Paroxysmal atrial fibrillation (10%).  Atrial fibrillation appears to be rate-controlled when it occurs.   . Prostate cancer ([place]) dx 2019  . Pulmonary nodule, right 08/18/2017   6 mm right upper lobe, 5.5 mm right middle lobe, Noted on CT Chest  . Pyloric stenosis   . Thoracic ascending aortic aneurysm ([place]) 01/30/2017   3.9 cm    Past Surgical History:  Procedure Laterality Date  . ATRIAL FIBRILLATION ABLATION  07/2010   Afib ablation by Dr Roxan Hockey at [facility] in 2012  . BALLOON DILATION N/A 07/31/2016   Procedure: BALLOON DILATION;  Surgeon: Manus Gunning, MD;  Location: [facility];  Service: Gastroenterology;  Laterality: N/A;  . BALLOON DILATION N/A 08/27/2016   Procedure: BALLOON DILATION;  Surgeon: Manus Gunning, MD;  Location: [facility]  ENDOSCOPY;  Service: Gastroenterology;  Laterality: N/A;  . CARDIAC CATHETERIZATION    . CARDIAC CATHETERIZATION N/A 08/17/2015   Procedure: Left Heart Cath and Coronary Angiography;  Surgeon: Larey Dresser, MD;  Location: [facility];  Service: Cardiovascular;  Laterality: N/A;  . COLONOSCOPY W/ POLYPECTOMY  06/26/2016  . CORONARY STENT PLACEMENT     x 2  . EP IMPLANTABLE DEVICE N/A 03/09/2015   Procedure: Loop Recorder Removal;  Surgeon: [name], MD;  Location: [facility];  Service: Cardiovascular;  Laterality: N/A;  . ESOPHAGOGASTRODUODENOSCOPY N/A 07/31/2016   Procedure: ESOPHAGOGASTRODUODENOSCOPY (EGD) also have pediatric scope available;  Surgeon: Manus Gunning, MD;  Location: [facility] ENDOSCOPY;   Service: Gastroenterology;  Laterality: N/A;  . ESOPHAGOGASTRODUODENOSCOPY N/A 08/27/2016   Procedure: ESOPHAGOGASTRODUODENOSCOPY (EGD);  Surgeon: Manus Gunning, MD;  Location: [facility];  Service: Gastroenterology;  Laterality: N/A;  . implantable loop recorder implantation  08/13/2010   MDT Reveal XT implanted by Dr Roxan Hockey for afib management post ablation  . LEFT HEART CATH N/A 07/15/2013   Procedure: LEFT HEART CATH;  Surgeon: Clent Demark, MD;  Location: [facility] CATH LAB;  Service: Cardiovascular;  Laterality: N/A;  . loop recorder removed  2017  . RADIOACTIVE SEED IMPLANT N/A 11/27/2017   Procedure: RADIOACTIVE SEED IMPLANT/BRACHYTHERAPY IMPLANT;  Surgeon: Kathie Rhodes, MD;  Location: [facility];  Service: Urology;  Laterality: N/A;  . SPACE OAR INSTILLATION N/A 11/27/2017   Procedure: SPACE OAR INSTILLATION;  Surgeon: Kathie Rhodes, MD;  Location: [facility];  Service: Urology;  Laterality: N/A;  . TONSILLECTOMY  age 41    Social History   Socioeconomic History  . Marital status: Married    Spouse name: Not on file  . Number of children: 1  . Years of education: Not on file  . Highest education level: Not on file  Occupational History  . Occupation: retired  Tobacco Use  . Smoking status: Former Smoker    Packs/day: 0.25    Years: 30.00    Pack years: 7.50    Types: Cigarettes    Quit date: 07/21/2003    Years since quitting: 16.6  . Smokeless tobacco: Never Used  Vaping Use  . Vaping Use: Never used  Substance and Sexual Activity  . Alcohol use: Yes    Alcohol/week: 1.0 standard drink    Types: 1 Glasses of wine per week    Comment: occasional wine or beer  . Drug use: No    Comment: marijuana use in past several yrs ago  . Sexual activity: Yes  Other Topics Concern  . Not on file  Social History Narrative   Pt lives in [place] with wife.  Retired from [employer].  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  One son.    Social Determinants of Health   Financial Resource Strain: Not on file  Food Insecurity: Not on file  Transportation Needs: Not on file  Physical Activity: Not on file  Stress: Not on file  Social Connections: Not on file  Intimate Partner Violence: Not on file    Family History  Problem Relation Age of Onset  . Dementia Mother   . Colon cancer Mother   . Prostate cancer Mother   . Heart disease Father   . Hyperlipidemia Father   . Colon cancer Maternal Grandmother   . Pancreatic cancer Sister     Current Outpatient Medications  Medication Sig Dispense Refill  .  acetaminophen (TYLENOL) 325 MG tablet Take 325 mg by mouth every 6 (six) hours as needed (FOR PAIN.).    Marland Kitchen Alirocumab (PRALUENT) 75 MG/ML SOAJ Inject 75 mg into the skin every 14 (fourteen) days. 2 pen 11  . apixaban (ELIQUIS) 5 MG TABS tablet Take 1 tablet (5 mg total) by mouth 2 (two) times daily. 180 tablet 3  . Cholecalciferol 100 MCG (4000 UT) CAPS Take 4,000 Units by mouth daily.    Marland Kitchen dextromethorphan-guaiFENesin (MUCINEX DM) 30-600 MG 12hr tablet Take 1 tablet by mouth 2 (two) times daily as needed for cough.     No current facility-administered medications for this visit.    Allergies  Allergen Reactions  . Clopidogrel Other (See Comments)    unknown reaction  . Crestor [Rosuvastatin Calcium] Other (See Comments)    Extreme muscular weakness  . Lipitor [Atorvastatin Calcium] Other (See Comments)    Extreme muscular weakness   . Statins Other (See Comments)    Extreme muscular weakness      REVIEW OF SYSTEMS:   [X]  denotes positive finding, [ ]  denotes negative finding Cardiac  Comments:  Chest pain or chest pressure:    Shortness of breath upon exertion:    Short of breath when lying flat:    Irregular heart rhythm:        Vascular    Pain in calf, thigh, or hip brought on by ambulation:    Pain in feet at night that wakes you up from your sleep:     Blood  clot in your veins:    Leg swelling:         Pulmonary    Oxygen at home:    Productive cough:     Wheezing:         Neurologic    Sudden weakness in arms or legs:     Sudden numbness in arms or legs:     Sudden onset of difficulty speaking or slurred speech:    Temporary loss of vision in one eye:     Problems with dizziness:         Gastrointestinal    Blood in stool:     Vomited blood:         Genitourinary    Burning when urinating:     Blood in urine:        Psychiatric    Major depression:         Hematologic    Bleeding problems:    Problems with blood clotting too easily:        Skin    Rashes or ulcers:        Constitutional    Fever or chills:      PHYSICAL EXAMINATION:  Vitals:   03/09/20 1004  BP: (!) 147/82  Pulse: (!) 40  Resp: 20  Temp: 97.7 F (36.5 C)  TempSrc: Temporal  SpO2: 99%  Weight: 160 lb 3.2 oz (72.7 kg)  Height: 5\' 9"  (1.753 m)    General:  WDWN in NAD; vital signs documented above Gait: Normal HENT: WNL, normocephalic Pulmonary: normal non-labored breathing , without Rales, rhonchi,  wheezing Cardiac: regular HR, without  Murmurs without carotid bruit Abdomen: soft, flat, NT, no masses. Does have palpable AAA pulse Vascular Exam/Pulses:  Right Left  Radial 2+ (normal) 2+ (normal)  Femoral 2+ (normal) 2+ (normal)  Popliteal 2+ (normal) 2+ (normal)  DP 2+ (normal) 2+ (normal)  PT 1+ (weak) 1+ (weak)   Extremities: without ischemic changes,  without Gangrene , without cellulitis; without open wounds;  Musculoskeletal: no muscle wasting or atrophy  Neurologic: A&O X 3;  No focal weakness or paresthesias are detected Psychiatric:  The pt has Normal affect.   Non-Invasive Vascular Imaging:   AAA duplex: Abdominal Aorta: There is evidence of abnormal dilatation of the proximal, mid and distal Abdominal aorta. The largest aortic measurement is 4.1 cm. The largest aortic diameter remains essentially unchanged compared to   prior exam. Previous diameter measurement was 4.0 cm obtained on 03/18/2018.   Renal artery Duplex:  Right: Normal size right kidney. No evidence of right renal artery stenosis. RRV flow present. Second renal artery not identified.  Left: Normal size of left kidney. Evidence of a > 60% stenosis in the left renal artery. LRV flow present.     ASSESSMENT/PLAN:: 75 y.o. male here for follow up for AAA and RAS. His duplex today shows relatively unchanged AAA with maximum diameter of 4.1cm. His renal artery stenosis remains at 60%. He is without any symptoms relatable to his AAA. His blood pressure remains well controlled not on any medications. -Left RAS ~ 60%.  Would not intervene unless he is maxed out on antihypertensives and still uncontrolled and then would consider intervention --Discussed symptoms of rupture AAA/expansion with pt and that he developed these sx, he would need to call 911 and go to ER - he will follow up in 1 year with repeat Renal artery duplex and AAA duplex   Karoline Caldwell, PA-C [facility] [phone]  Clinic MD:  Dr. Donzetta Matters

## 2020-03-20 DIAGNOSIS — L579 Skin changes due to chronic exposure to nonionizing radiation, unspecified: Secondary | ICD-10-CM | POA: Diagnosis not present

## 2020-03-20 DIAGNOSIS — L570 Actinic keratosis: Secondary | ICD-10-CM | POA: Diagnosis not present

## 2020-03-20 DIAGNOSIS — L814 Other melanin hyperpigmentation: Secondary | ICD-10-CM | POA: Diagnosis not present

## 2020-03-20 DIAGNOSIS — L821 Other seborrheic keratosis: Secondary | ICD-10-CM | POA: Diagnosis not present

## 2020-03-20 DIAGNOSIS — Z85828 Personal history of other malignant neoplasm of skin: Secondary | ICD-10-CM | POA: Diagnosis not present

## 2020-04-07 DIAGNOSIS — M25521 Pain in right elbow: Secondary | ICD-10-CM | POA: Diagnosis not present

## 2020-04-07 DIAGNOSIS — M542 Cervicalgia: Secondary | ICD-10-CM | POA: Diagnosis not present

## 2020-04-07 DIAGNOSIS — M25522 Pain in left elbow: Secondary | ICD-10-CM | POA: Diagnosis not present

## 2020-04-07 DIAGNOSIS — M25511 Pain in right shoulder: Secondary | ICD-10-CM | POA: Diagnosis not present

## 2020-04-24 DIAGNOSIS — Z8546 Personal history of malignant neoplasm of prostate: Secondary | ICD-10-CM | POA: Diagnosis not present

## 2020-05-07 DIAGNOSIS — M25521 Pain in right elbow: Secondary | ICD-10-CM | POA: Diagnosis not present

## 2020-05-07 DIAGNOSIS — M25511 Pain in right shoulder: Secondary | ICD-10-CM | POA: Diagnosis not present

## 2020-05-11 ENCOUNTER — Ambulatory Visit (INDEPENDENT_AMBULATORY_CARE_PROVIDER_SITE_OTHER)
Admission: RE | Admit: 2020-05-11 | Discharge: 2020-05-11 | Disposition: A | Discharge status: Home / Self Care | Payer: Medicare PPO | Source: Ambulatory Visit | Attending: Pulmonary Disease | Admitting: Pulmonary Disease

## 2020-05-11 ENCOUNTER — Other Ambulatory Visit: Payer: Self-pay

## 2020-05-11 ENCOUNTER — Other Ambulatory Visit: Payer: Medicare PPO

## 2020-05-11 DIAGNOSIS — J984 Other disorders of lung: Secondary | ICD-10-CM | POA: Diagnosis not present

## 2020-05-11 DIAGNOSIS — I700 Atherosclerosis of aorta: Secondary | ICD-10-CM | POA: Diagnosis not present

## 2020-05-11 DIAGNOSIS — R918 Other nonspecific abnormal finding of lung field: Secondary | ICD-10-CM

## 2020-05-11 DIAGNOSIS — I2510 Atherosclerotic heart disease of native coronary artery without angina pectoris: Secondary | ICD-10-CM | POA: Diagnosis not present

## 2020-05-11 DIAGNOSIS — M47814 Spondylosis without myelopathy or radiculopathy, thoracic region: Secondary | ICD-10-CM | POA: Diagnosis not present

## 2020-05-17 ENCOUNTER — Encounter: Payer: Self-pay | Admitting: *Deleted

## 2020-05-17 NOTE — Progress Notes (Signed)
Leigh, please let patient know his lung nodule is stable within the right middle lobe.  No additional follow-up needed.  Thanks,  BLI  Garner Nash, DO [facility] 05/17/2020 7:24 PM

## 2020-05-31 ENCOUNTER — Encounter: Payer: Self-pay | Admitting: Family Medicine

## 2020-05-31 ENCOUNTER — Ambulatory Visit (INDEPENDENT_AMBULATORY_CARE_PROVIDER_SITE_OTHER): Payer: Medicare PPO | Admitting: Family Medicine

## 2020-05-31 ENCOUNTER — Other Ambulatory Visit: Payer: Self-pay

## 2020-05-31 VITALS — BP 143/86 | HR 54 | Ht 69.0 in | Wt 158.0 lb

## 2020-05-31 DIAGNOSIS — Z0000 Encounter for general adult medical examination without abnormal findings: Secondary | ICD-10-CM

## 2020-05-31 DIAGNOSIS — I480 Paroxysmal atrial fibrillation: Secondary | ICD-10-CM | POA: Diagnosis not present

## 2020-05-31 DIAGNOSIS — I25119 Atherosclerotic heart disease of native coronary artery with unspecified angina pectoris: Secondary | ICD-10-CM | POA: Diagnosis not present

## 2020-05-31 DIAGNOSIS — Z0001 Encounter for general adult medical examination with abnormal findings: Secondary | ICD-10-CM

## 2020-05-31 DIAGNOSIS — I119 Hypertensive heart disease without heart failure: Secondary | ICD-10-CM | POA: Diagnosis not present

## 2020-05-31 DIAGNOSIS — E7800 Pure hypercholesterolemia, unspecified: Secondary | ICD-10-CM

## 2020-05-31 DIAGNOSIS — Z23 Encounter for immunization: Secondary | ICD-10-CM | POA: Diagnosis not present

## 2020-05-31 MED ORDER — PRALUENT 75 MG/ML ~~LOC~~ SOAJ: 75.0000 mg | SUBCUTANEOUS | 3 refills | Status: DC

## 2020-05-31 NOTE — Progress Notes (Signed)
BP (!) 143/86   Pulse (!) 54   Ht 5' 9" (1.753 m)   Wt 158 lb (71.7 kg)   SpO2 96%   BMI 23.33 kg/m    Subjective:   Patient ID: Dustin Bauer, male    DOB: 1945/09/07, 75 y.o.   MRN: [id number]  HPI: Dustin Bauer is a 75 y.o. male presenting on 05/31/2020 for Medical Management of Chronic Issues (CPE)   HPI Adult well exam and physical Patient denies any chest pain, shortness of breath, headaches or vision issues, abdominal complaints, diarrhea, nausea, vomiting, or joint issues.     Paroxysmal A. fib and CAD Sees his cardiologist, currently takes Eliquis and Praluent  Hyperlipidemia Patient is coming in for recheck of his hyperlipidemia. The patient is currently taking Praluent. They deny any issues with myalgias or history of liver damage from it. They deny any focal numbness or weakness or chest pain.   Hypertension Patient is currently on no medication and diet control, and their blood pressure today is 143/86. Patient denies any lightheadedness or dizziness. Patient denies headaches, blurred vision, chest pains, shortness of breath, or weakness. Denies any side effects from medication and is content with current medication.   Relevant past medical, surgical, family and social history reviewed and updated as indicated. Interim medical history since our last visit reviewed. Allergies and medications reviewed and updated.  Review of Systems  Constitutional: Negative for chills and fever.  Respiratory: Negative for shortness of breath and wheezing.   Cardiovascular: Negative for chest pain, palpitations and leg swelling.  Musculoskeletal: Negative for back pain and gait problem.  Skin: Negative for rash.  Neurological: Negative for dizziness, weakness and light-headedness.  All other systems reviewed and are negative.   Per HPI unless specifically indicated above   Allergies as of 05/31/2020      Reactions   Clopidogrel Other (See Comments)   unknown reaction    Crestor [rosuvastatin Calcium] Other (See Comments)   Extreme muscular weakness   Lipitor [atorvastatin Calcium] Other (See Comments)   Extreme muscular weakness   Statins Other (See Comments)   Extreme muscular weakness      Medication List       Accurate as of May 31, 2020  2:35 PM. If you have any questions, ask your nurse or doctor.        acetaminophen 325 MG tablet Commonly known as: TYLENOL Take 325 mg by mouth every 6 (six) hours as needed (FOR PAIN.).   apixaban 5 MG Tabs tablet Commonly known as: ELIQUIS Take 1 tablet (5 mg total) by mouth 2 (two) times daily.   Cholecalciferol 100 MCG (4000 UT) Caps Take 4,000 Units by mouth daily.   dextromethorphan-guaiFENesin 30-600 MG 12hr tablet Commonly known as: MUCINEX DM Take 1 tablet by mouth 2 (two) times daily as needed for cough.   Praluent 75 MG/ML Soaj Generic drug: Alirocumab Inject 75 mg into the skin every 14 (fourteen) days.        Objective:   BP (!) 143/86   Pulse (!) 54   Ht 5' 9" (1.753 m)   Wt 158 lb (71.7 kg)   SpO2 96%   BMI 23.33 kg/m   Wt Readings from Last 3 Encounters:  05/31/20 158 lb (71.7 kg)  03/09/20 160 lb 3.2 oz (72.7 kg)  12/02/19 165 lb (74.8 kg)    Physical Exam Vitals and nursing note reviewed.  Constitutional:      General: He is not in acute  distress.    Appearance: He is well-developed. He is not diaphoretic.  Eyes:     General: No scleral icterus.    Conjunctiva/sclera: Conjunctivae normal.  Neck:     Thyroid: No thyromegaly.  Cardiovascular:     Rate and Rhythm: Normal rate and regular rhythm.     Heart sounds: Normal heart sounds. No murmur heard.   Pulmonary:     Effort: Pulmonary effort is normal. No respiratory distress.     Breath sounds: Normal breath sounds. No wheezing.  Musculoskeletal:        General: Normal range of motion.     Cervical back: Neck supple.  Lymphadenopathy:     Cervical: No cervical adenopathy.  Skin:    General: Skin is warm  and dry.     Findings: No rash.  Neurological:     Mental Status: He is alert and oriented to person, place, and time.     Coordination: Coordination normal.  Psychiatric:        Behavior: Behavior normal.       Assessment & Plan:   Problem List Items Addressed This Visit      Cardiovascular and Mediastinum   Paroxysmal atrial fibrillation (HCC)   Relevant Medications   Alirocumab (PRALUENT) 75 MG/ML SOAJ   Other Relevant Orders   CBC with Differential/Platelet   CMP14+EGFR   C-reactive protein   CAD (coronary artery disease)   Relevant Medications   Alirocumab (PRALUENT) 75 MG/ML SOAJ   Other Relevant Orders   Lipid panel   Hypertensive cardiovascular disease   Relevant Medications   Alirocumab (PRALUENT) 75 MG/ML SOAJ   Other Relevant Orders   CBC with Differential/Platelet   CMP14+EGFR   Lipid panel   C-reactive protein     Other   Hyperlipidemia   Relevant Medications   Alirocumab (PRALUENT) 75 MG/ML SOAJ   Other Relevant Orders   Lipid panel    Other Visit Diagnoses    Well adult exam    -  Primary   Relevant Orders   CBC with Differential/Platelet   CMP14+EGFR   Lipid panel   C-reactive protein    Continue current medication, he will come back to do blood work.  Follow up plan: Return in about 6 months (around 12/01/2020), or if symptoms worsen or fail to improve, for Hyperlipidemia and A. fib.  Counseling provided for all of the vaccine components No orders of the defined types were placed in this encounter.   Caryl Pina, MD [facility] 05/31/2020, 2:35 PM

## 2020-05-31 NOTE — Addendum Note (Signed)
Addended by: Alphonzo Dublin on: 05/31/2020 03:12 PM   Modules accepted: Orders

## 2020-06-04 ENCOUNTER — Other Ambulatory Visit: Payer: Self-pay

## 2020-06-04 ENCOUNTER — Other Ambulatory Visit: Payer: Medicare PPO

## 2020-06-04 DIAGNOSIS — I480 Paroxysmal atrial fibrillation: Secondary | ICD-10-CM

## 2020-06-04 DIAGNOSIS — I25119 Atherosclerotic heart disease of native coronary artery with unspecified angina pectoris: Secondary | ICD-10-CM

## 2020-06-04 DIAGNOSIS — I119 Hypertensive heart disease without heart failure: Secondary | ICD-10-CM

## 2020-06-04 DIAGNOSIS — E7800 Pure hypercholesterolemia, unspecified: Secondary | ICD-10-CM | POA: Diagnosis not present

## 2020-06-04 DIAGNOSIS — Z0000 Encounter for general adult medical examination without abnormal findings: Secondary | ICD-10-CM | POA: Diagnosis not present

## 2020-06-05 LAB — CBC WITH DIFFERENTIAL/PLATELET
Basophils Absolute: 0 10*3/uL (ref 0.0–0.2)
Basos: 0 %
EOS (ABSOLUTE): 0.4 10*3/uL (ref 0.0–0.4)
Eos: 6 %
Hematocrit: 45.6 % (ref 37.5–51.0)
Hemoglobin: 15.7 g/dL (ref 13.0–17.7)
Immature Grans (Abs): 0 10*3/uL (ref 0.0–0.1)
Immature Granulocytes: 0 %
Lymphocytes Absolute: 2.4 10*3/uL (ref 0.7–3.1)
Lymphs: 31 %
MCH: 32.1 pg (ref 26.6–33.0)
MCHC: 34.4 g/dL (ref 31.5–35.7)
MCV: 93 fL (ref 79–97)
Monocytes Absolute: 0.8 10*3/uL (ref 0.1–0.9)
Monocytes: 10 %
Neutrophils Absolute: 4 10*3/uL (ref 1.4–7.0)
Neutrophils: 53 %
Platelets: 239 10*3/uL (ref 150–450)
RBC: 4.89 x10E6/uL (ref 4.14–5.80)
RDW: 13.7 % (ref 11.6–15.4)
WBC: 7.6 10*3/uL (ref 3.4–10.8)

## 2020-06-05 LAB — CMP14+EGFR
ALT: 14 IU/L (ref 0–44)
AST: 21 IU/L (ref 0–40)
Albumin/Globulin Ratio: 1.8 (ref 1.2–2.2)
Albumin: 4.2 g/dL (ref 3.7–4.7)
Alkaline Phosphatase: 93 IU/L (ref 44–121)
BUN/Creatinine Ratio: 14 (ref 10–24)
BUN: 15 mg/dL (ref 8–27)
Bilirubin Total: 0.5 mg/dL (ref 0.0–1.2)
CO2: 25 mmol/L (ref 20–29)
Calcium: 9.9 mg/dL (ref 8.6–10.2)
Chloride: 106 mmol/L (ref 96–106)
Creatinine, Ser: 1.08 mg/dL (ref 0.76–1.27)
Globulin, Total: 2.3 g/dL (ref 1.5–4.5)
Glucose: 96 mg/dL (ref 65–99)
Potassium: 4.7 mmol/L (ref 3.5–5.2)
Sodium: 144 mmol/L (ref 134–144)
Total Protein: 6.5 g/dL (ref 6.0–8.5)
eGFR: 72 mL/min/1.73

## 2020-06-05 LAB — LIPID PANEL
Chol/HDL Ratio: 2.3 ratio (ref 0.0–5.0)
Cholesterol, Total: 125 mg/dL (ref 100–199)
HDL: 55 mg/dL (ref >39)
LDL Chol Calc (NIH): 56 mg/dL (ref 0–99)
Triglycerides: 70 mg/dL (ref 0–149)
VLDL Cholesterol Cal: 14 mg/dL (ref 5–40)

## 2020-06-05 LAB — C-REACTIVE PROTEIN: CRP: 3 mg/L (ref 0–10)

## 2020-06-13 ENCOUNTER — Ambulatory Visit (HOSPITAL_COMMUNITY): Payer: Medicare PPO | Admitting: Physician Assistant

## 2020-07-05 ENCOUNTER — Telehealth: Payer: Self-pay | Admitting: Cardiology

## 2020-07-05 MED ORDER — PRALUENT 75 MG/ML ~~LOC~~ SOAJ: 75.0000 mg | SUBCUTANEOUS | 3 refills | Status: DC

## 2020-07-05 NOTE — Telephone Encounter (Signed)
*  STAT* If patient is at the pharmacy, call can be transferred to refill team.   1. Which medications need to be refilled? (please list name of each medication and dose if known) Alirocumab (PRALUENT) 75 MG/ML SOAJ  2. Which pharmacy/location (including street and city if local pharmacy) is medication to be sent to? [facility] - [place], [place]  3. Do they need a 30 day or 90 day supply? 90 day   Patient has an appointment 10/17/2020

## 2020-07-05 NOTE — Telephone Encounter (Signed)
Refill sent in

## 2020-07-31 DIAGNOSIS — D485 Neoplasm of uncertain behavior of skin: Secondary | ICD-10-CM | POA: Diagnosis not present

## 2020-08-15 DIAGNOSIS — H43811 Vitreous degeneration, right eye: Secondary | ICD-10-CM | POA: Diagnosis not present

## 2020-08-15 DIAGNOSIS — H04123 Dry eye syndrome of bilateral lacrimal glands: Secondary | ICD-10-CM | POA: Diagnosis not present

## 2020-08-15 DIAGNOSIS — H40013 Open angle with borderline findings, low risk, bilateral: Secondary | ICD-10-CM | POA: Diagnosis not present

## 2020-08-15 DIAGNOSIS — H02834 Dermatochalasis of left upper eyelid: Secondary | ICD-10-CM | POA: Diagnosis not present

## 2020-08-15 DIAGNOSIS — H2513 Age-related nuclear cataract, bilateral: Secondary | ICD-10-CM | POA: Diagnosis not present

## 2020-08-15 DIAGNOSIS — H02831 Dermatochalasis of right upper eyelid: Secondary | ICD-10-CM | POA: Diagnosis not present

## 2020-08-29 DIAGNOSIS — Z809 Family history of malignant neoplasm, unspecified: Secondary | ICD-10-CM | POA: Diagnosis not present

## 2020-08-29 DIAGNOSIS — Z7901 Long term (current) use of anticoagulants: Secondary | ICD-10-CM | POA: Diagnosis not present

## 2020-08-29 DIAGNOSIS — I70209 Unspecified atherosclerosis of native arteries of extremities, unspecified extremity: Secondary | ICD-10-CM | POA: Diagnosis not present

## 2020-08-29 DIAGNOSIS — I4891 Unspecified atrial fibrillation: Secondary | ICD-10-CM | POA: Diagnosis not present

## 2020-08-29 DIAGNOSIS — I10 Essential (primary) hypertension: Secondary | ICD-10-CM | POA: Diagnosis not present

## 2020-08-29 DIAGNOSIS — E785 Hyperlipidemia, unspecified: Secondary | ICD-10-CM | POA: Diagnosis not present

## 2020-08-29 DIAGNOSIS — I2510 Atherosclerotic heart disease of native coronary artery without angina pectoris: Secondary | ICD-10-CM | POA: Diagnosis not present

## 2020-08-29 DIAGNOSIS — I252 Old myocardial infarction: Secondary | ICD-10-CM | POA: Diagnosis not present

## 2020-08-29 DIAGNOSIS — M1990 Unspecified osteoarthritis, unspecified site: Secondary | ICD-10-CM | POA: Diagnosis not present

## 2020-10-02 DIAGNOSIS — L570 Actinic keratosis: Secondary | ICD-10-CM | POA: Diagnosis not present

## 2020-10-02 DIAGNOSIS — L814 Other melanin hyperpigmentation: Secondary | ICD-10-CM | POA: Diagnosis not present

## 2020-10-02 DIAGNOSIS — L821 Other seborrheic keratosis: Secondary | ICD-10-CM | POA: Diagnosis not present

## 2020-10-02 DIAGNOSIS — L579 Skin changes due to chronic exposure to nonionizing radiation, unspecified: Secondary | ICD-10-CM | POA: Diagnosis not present

## 2020-10-02 DIAGNOSIS — L820 Inflamed seborrheic keratosis: Secondary | ICD-10-CM | POA: Diagnosis not present

## 2020-10-02 DIAGNOSIS — Z85828 Personal history of other malignant neoplasm of skin: Secondary | ICD-10-CM | POA: Diagnosis not present

## 2020-10-09 DIAGNOSIS — Z8546 Personal history of malignant neoplasm of prostate: Secondary | ICD-10-CM | POA: Diagnosis not present

## 2020-10-16 DIAGNOSIS — Z8546 Personal history of malignant neoplasm of prostate: Secondary | ICD-10-CM | POA: Diagnosis not present

## 2020-10-16 DIAGNOSIS — R3915 Urgency of urination: Secondary | ICD-10-CM | POA: Diagnosis not present

## 2020-10-16 NOTE — Progress Notes (Signed)
Cardiology Office Note   Date:  10/17/2020 I reviewed [name] brain with risk  ID:  Dustin Bauer, DOB 17-Mar-1945, MRN [id number]  PCP:  Dettinger, Dustin Kaufmann, MD  Cardiologist:   Minus Breeding, MD  Chief Complaint  Patient presents with   Atrial Fibrillation       History of Present Illness: Dustin Bauer is a 75 y.o. male who presents for follow up of atrial fib.  He is s/p ablation at [facility], CAD with PCI to the LAD 2011, inferoposterior STEMI in 2015 requiring DES to occluded RCA (100%), with EF of 67%-61% complicated by CHB which resolved without need for PPM implantation. Repeat LHC in 2017 revealing patent RCA and LAD. HR in the 40's.  He had a small fixed moderate intensity mid anterospetal perfusion defect recently on a Lexiscan Myoview that was ordered to evaluate chest pain.   Since I last saw him he has had no new acute complaints.  He exercises routinely.  He is very anxious about his family history and his elevated CRP.  Yet he would not  take Cozaar in the past for his blood pressure which is too elevated.  After a long discussion it does not sound like he has any new symptoms.  He has some "good days and bad days.  However, he is not getting any chest pressure, neck or arm discomfort.  He does not have any syncope or presyncope which is his previous event presenting.  He does not have any edema nausea that his father had when he had heart disease.  In short it does not sound like he has anything different in 2020 when he had low risk perfusion study.  He thinks he is out of rhythm sometimes but he is not documented any atrial fibrillation.  This is not really different than the last time he wore a monitor.   Past Medical History:  Diagnosis Date   AAA (abdominal aortic aneurysm) 01/20/2017   3.9cm infrarenal   Allergy to ACE inhibitors    Cough and fatigue with ACEI   Aortic atherosclerosis ([place]) 08/18/2017   Noted on CT chest   CAD (coronary artery disease) stent x 1 2011  and 1  2014   a. LHC in 2011 for stable angina >> DES to pLAD [facility])  //  b. inf-post STEMI 7/15 c/b CHB with jxn escape >> LHC: OM1 60-70, pLAD 30-40, m-d RCA occluded, EF 50-55 >> PCI: 3 x 33 mm Xience DES to RCA  //  c. LHC 8/17: dLM 20, pLAD 20, pLAD stent ok, pRI 30, pRCA stent ok with 30% ISR   Colon polyps    Diverticulosis    Emphysema lung ([place]) 08/18/2017   Noted on CT chest   Gastric stenosis 08/27/2016   at pylorus, noted on endoscopy   GERD (gastroesophageal reflux disease)    Hydrocele, bilateral 01/30/2017   Noted on CT chest   Hyperlipidemia    Myalgias with Zocor, Crestor, atorvastatin.    Nicotine abuse    PAF (paroxysmal atrial fibrillation) [facility])    s/p ablation by Dr Dustin Bauer at [facility] in 2012 // Xarelto anticoagulation  //  ILR removed 2/17 // Event Monitor 11/17: Predominantly NSR (90%). Paroxysmal atrial fibrillation (10%).  Atrial fibrillation appears to be rate-controlled when it occurs.    Prostate cancer ([place]) dx 2019   Pulmonary nodule, right 08/18/2017   6 mm right upper lobe, 5.5 mm right middle lobe, Noted on CT Chest  Pyloric stenosis    Thoracic ascending aortic aneurysm 01/30/2017   3.9 cm    Past Surgical History:  Procedure Laterality Date   ATRIAL FIBRILLATION ABLATION  07/2010   Afib ablation by Dr Dustin Bauer at [facility] in 2012   BALLOON DILATION N/A 07/31/2016   Procedure: Dustin Bauer DILATION;  Surgeon: Dustin Gunning, MD;  Location: [facility];  Service: Gastroenterology;  Laterality: N/A;   BALLOON DILATION N/A 08/27/2016   Procedure: BALLOON DILATION;  Surgeon: Dustin Gunning, MD;  Location: [facility];  Service: Gastroenterology;  Laterality: N/A;   CARDIAC CATHETERIZATION     CARDIAC CATHETERIZATION N/A 08/17/2015   Procedure: Left Heart Cath and Coronary Angiography;  Surgeon: Larey Dresser, MD;  Location: [place] CV LAB;  Service: Cardiovascular;  Laterality: N/A;   COLONOSCOPY W/ POLYPECTOMY  06/26/2016    CORONARY STENT PLACEMENT     x 2   EP IMPLANTABLE DEVICE N/A 03/09/2015   Procedure: Loop Recorder Removal;  Surgeon: Dustin Grayer, MD;  Location: [place] CV LAB;  Service: Cardiovascular;  Laterality: N/A;   ESOPHAGOGASTRODUODENOSCOPY N/A 07/31/2016   Procedure: ESOPHAGOGASTRODUODENOSCOPY (EGD) also have pediatric scope available;  Surgeon: Dustin Gunning, MD;  [facility]: [facility];  Service: Gastroenterology;  Laterality: N/A;   ESOPHAGOGASTRODUODENOSCOPY N/A 08/27/2016   Procedure: ESOPHAGOGASTRODUODENOSCOPY (EGD);  Surgeon: Dustin Gunning, MD;  Location: [facility];  Service: Gastroenterology;  Laterality: N/A;   implantable loop recorder implantation  08/13/2010   MDT Reveal XT implanted by Dr Dustin Bauer for afib management post ablation   LEFT HEART CATH N/A 07/15/2013   Procedure: LEFT HEART CATH;  Surgeon: Dustin Demark, MD;  Location: [facility] CATH LAB;  Service: Cardiovascular;  Laterality: N/A;   loop recorder removed  2017   RADIOACTIVE SEED IMPLANT N/A 11/27/2017   Procedure: RADIOACTIVE SEED IMPLANT/BRACHYTHERAPY IMPLANT;  Surgeon: Dustin Rhodes, MD;  Location: [place];  Service: Urology;  Laterality: N/A;   SPACE OAR INSTILLATION N/A 11/27/2017   Procedure: SPACE OAR INSTILLATION;  Surgeon: Dustin Rhodes, MD;  Location: [facility];  Service: Urology;  Laterality: N/A;   TONSILLECTOMY  age 64     Current Outpatient Medications  Medication Sig Dispense Refill   acetaminophen (TYLENOL) 325 MG tablet Take 325 mg by mouth every 6 (six) hours as needed (FOR PAIN.).     Alirocumab (PRALUENT) 75 MG/ML SOAJ Inject 75 mg into the skin every 14 (fourteen) days. 6 mL 3   apixaban (ELIQUIS) 5 MG TABS tablet Take 1 tablet (5 mg total) by mouth 2 (two) times daily. 180 tablet 3   Cholecalciferol 100 MCG (4000 UT) CAPS Take 4,000 Units by mouth daily.     dextromethorphan-guaiFENesin (MUCINEX DM) 30-600 MG 12hr tablet Take 1 tablet by mouth  2 (two) times daily as needed for cough.     losartan (COZAAR) 25 MG tablet Take 1 tablet (25 mg total) by mouth daily. 90 tablet 3   No current facility-administered medications for this visit.    Allergies:   Clopidogrel, Crestor [rosuvastatin calcium], Lipitor [atorvastatin calcium], and Statins    ROS:  Please see the history of present illness.   Otherwise, review of systems are positive for none.   All other systems are reviewed and negative.    PHYSICAL EXAM: VS:  BP (!) 160/84   Pulse (!) 51   Ht 5\' 10"  (1.778 m)   Wt 162 lb (73.5 kg)   BMI 23.24 kg/m  , BMI Body mass index is  23.24 kg/m. GENERAL:  Well appearing NECK:  No jugular venous distention, waveform within normal limits, carotid upstroke brisk and symmetric, no bruits, no thyromegaly LUNGS:  Clear to auscultation bilaterally CHEST:  Unremarkable HEART:  PMI not displaced or sustained,S1 and S2 within normal limits, no S3, no S4, no clicks, no rubs, no murmurs ABD:  Flat, positive bowel sounds normal in frequency in pitch, no bruits, no rebound, no guarding, no midline pulsatile mass, no hepatomegaly, no splenomegaly EXT:  2 plus pulses throughout, no edema, no cyanosis no clubbing   EKG:  EKG is  ordered today. Sinus rhythm, rate 51, premature atrial contractions, nonspecific T wave changes no change from previous  Recent Labs: 06/04/2020: ALT 14; BUN 15; Creatinine, Ser 1.08; Hemoglobin 15.7; Platelets 239; Potassium 4.7; Sodium 144    Lipid Panel    Component Value Date/Time   CHOL 125 06/04/2020 0935   CHOL 100 (L) 12/28/2014 1609   TRIG 70 06/04/2020 0935   TRIG 95 06/04/2015 1501   HDL 55 06/04/2020 0935   HDL 55 06/04/2015 1501   CHOLHDL 2.3 06/04/2020 0935   CHOLHDL 2.2 11/27/2015 1636   VLDL 17 11/27/2015 1636   LDLCALC 56 06/04/2020 0935   LDLCALC 33 12/28/2014 1609      Wt Readings from Last 3 Encounters:  10/17/20 162 lb (73.5 kg)  05/31/20 158 lb (71.7 kg)  03/09/20 160 lb 3.2 oz  (72.7 kg)      Other studies Reviewed: Additional studies/ records that were reviewed today include: Labs Review of the above records demonstrates:  Please see elsewhere in the note.     ASSESSMENT AND PLAN:  CAD:  I had a very long discussion with him.  In particular I went through the logic of why we do not do screening diagnostic catheterizations in people who do not have unstable symptoms or symptoms at all.  He is very anxious about his family history and I explained the difference between primary and secondary risk reduction.  I talked about the need for aggressive risk factor modification and his compliance with this as a primary therapy.  He does not have any new symptoms and so no further testing would be suggested but he is to be aware of the symptoms and let me know   PAF:   He had a monitor in the past with less than 1% A. fib.  He is taking anticoagulation.  He has had no symptomatic paroxysms.  No change in therapy.  Hypercholesterolemia:  He remains on Praluent injections.  Excellent lipids.   HTN: His blood pressure is not controlled and I have convinced him to retry Cozaar which she did not want to take in the past but he had no specific contraindications or allergies.  We will get a basic metabolic profile in 2 weeks.   Hx of AAA:   This is followed by [facility] Current medicines are reviewed at length with the patient today.  The patient does not have concerns regarding medicines.  The following changes have been made: As above  Labs/ tests ordered today include:   Orders Placed This Encounter  Procedures   Basic metabolic panel   EKG [id number]      Disposition:   FU with me in 12 months.      Signed, Minus Breeding, MD  10/17/2020 1:42 PM    [facility]

## 2020-10-17 ENCOUNTER — Other Ambulatory Visit: Payer: Self-pay | Admitting: *Deleted

## 2020-10-17 ENCOUNTER — Ambulatory Visit (INDEPENDENT_AMBULATORY_CARE_PROVIDER_SITE_OTHER): Payer: Medicare PPO | Admitting: Cardiology

## 2020-10-17 ENCOUNTER — Encounter: Payer: Self-pay | Admitting: Cardiology

## 2020-10-17 ENCOUNTER — Other Ambulatory Visit: Payer: Self-pay

## 2020-10-17 VITALS — BP 160/84 | HR 51 | Ht 70.0 in | Wt 162.0 lb

## 2020-10-17 DIAGNOSIS — I480 Paroxysmal atrial fibrillation: Secondary | ICD-10-CM | POA: Diagnosis not present

## 2020-10-17 DIAGNOSIS — I2510 Atherosclerotic heart disease of native coronary artery without angina pectoris: Secondary | ICD-10-CM

## 2020-10-17 DIAGNOSIS — I10 Essential (primary) hypertension: Secondary | ICD-10-CM | POA: Diagnosis not present

## 2020-10-17 DIAGNOSIS — Z79899 Other long term (current) drug therapy: Secondary | ICD-10-CM | POA: Diagnosis not present

## 2020-10-17 DIAGNOSIS — I7143 Infrarenal abdominal aortic aneurysm, without rupture: Secondary | ICD-10-CM

## 2020-10-17 DIAGNOSIS — E7800 Pure hypercholesterolemia, unspecified: Secondary | ICD-10-CM

## 2020-10-17 DIAGNOSIS — Z8679 Personal history of other diseases of the circulatory system: Secondary | ICD-10-CM | POA: Diagnosis not present

## 2020-10-17 DIAGNOSIS — E785 Hyperlipidemia, unspecified: Secondary | ICD-10-CM

## 2020-10-17 MED ORDER — LOSARTAN POTASSIUM 25 MG PO TABS: 25.0000 mg | ORAL_TABLET | Freq: Every day | ORAL | 3 refills | Status: DC

## 2020-10-17 NOTE — Patient Instructions (Signed)
Medication Instructions:  Please start Losartan 25 mg a day. Continue all other medications as listed.  *If you need a refill on your cardiac medications before your next appointment, please call your pharmacy*  Lab Work: Please have blood work in 2 weeks at [facility]  ([facility]) If you have labs (blood work) drawn today and your tests are completely normal, you will receive your results only by: [place] (if you have MyChart) OR A paper copy in the mail If you have any lab test that is abnormal or we need to change your treatment, we will call you to review the results.  Follow-Up: At [facility], you and your health needs are our priority.  As part of our continuing mission to provide you with exceptional heart care, we have created designated Provider Care Teams.  These Care Teams include your primary Cardiologist (physician) and Advanced Practice Providers (APPs -  Physician Assistants and Nurse Practitioners) who all work together to provide you with the care you need, when you need it.  We recommend signing up for the patient portal called "MyChart".  Sign up information is provided on this After Visit Summary.  MyChart is used to connect with patients for Virtual Visits (Telemedicine).  Patients are able to view lab/test results, encounter notes, upcoming appointments, etc.  Non-urgent messages can be sent to your provider as well.   To learn more about what you can do with MyChart, go to [email or web address].    Your next appointment:   1 year(s)  The format for your next appointment:   In Person  Provider:   Minus Breeding, MD   Please keep a blood pressure diary.  Thank you for choosing [facility]!!

## 2020-10-25 ENCOUNTER — Encounter: Payer: Self-pay | Admitting: Family Medicine

## 2020-10-25 ENCOUNTER — Ambulatory Visit: Payer: Medicare PPO | Admitting: Family Medicine

## 2020-10-25 DIAGNOSIS — J069 Acute upper respiratory infection, unspecified: Secondary | ICD-10-CM

## 2020-10-25 NOTE — Progress Notes (Signed)
Virtual Visit via telephone Note Due to COVID-19 pandemic this visit was conducted virtually. This visit type was conducted due to national recommendations for restrictions regarding the COVID-19 Pandemic (e.g. social distancing, sheltering in place) in an effort to limit this patient's exposure and mitigate transmission in our community. All issues noted in this document were discussed and addressed.  A physical exam was not performed with this format.   I connected with Dustin Bauer on 10/25/2020 at 1200 by telephone and verified that I am speaking with the correct person using two identifiers. Dustin Bauer is currently located at home and  no one  is currently with them during visit. The provider, Monia Pouch, FNP is located in their office at time of visit.  I discussed the limitations, risks, security and privacy concerns of performing an evaluation and management service by telephone and the availability of in person appointments. I also discussed with the patient that there may be a patient responsible charge related to this service. The patient expressed understanding and agreed to proceed.  Subjective:  Patient ID: Dustin Bauer, male    DOB: 09/01/1945, 75 y.o.   MRN: [id number]  Chief Complaint:  URI   HPI: Dustin Bauer is a 75 y.o. male presenting on 10/25/2020 for URI   Pt states he was at a festival over the weekend. States he developed cough, congestion, and rhinorrhea on Monday. He states symptoms have continued and he is worried he may have COVID-19. He has been vaccinated and received one booster. He has not checked his temperature but reports chills. Has been taking Tylenol with some relief of symptoms.   URI  This is a new problem. The current episode started in the past 7 days. The problem has been unchanged. There has been no fever. Associated symptoms include congestion, coughing, headaches, rhinorrhea and a sore throat. Pertinent negatives include no abdominal pain,  chest pain, diarrhea, dysuria, ear pain, joint pain, joint swelling, nausea, neck pain, plugged ear sensation, rash, sinus pain, sneezing, swollen glands, vomiting or wheezing. He has tried acetaminophen for the symptoms. The treatment provided mild relief.    Relevant past medical, surgical, family, and social history reviewed and updated as indicated.  Allergies and medications reviewed and updated.   Past Medical History:  Diagnosis Date   AAA (abdominal aortic aneurysm) 01/20/2017   3.9cm infrarenal   Allergy to ACE inhibitors    Cough and fatigue with ACEI   Aortic atherosclerosis ([place]) 08/18/2017   Noted on CT chest   CAD (coronary artery disease) stent x 1 2011 and 1  2014   a. LHC in 2011 for stable angina >> DES to pLAD [facility])  //  b. inf-post STEMI 7/15 c/b CHB with jxn escape >> LHC: OM1 60-70, pLAD 30-40, m-d RCA occluded, EF 50-55 >> PCI: 3 x 33 mm Xience DES to RCA  //  c. LHC 8/17: dLM 20, pLAD 20, pLAD stent ok, pRI 30, pRCA stent ok with 30% ISR   Colon polyps    Diverticulosis    Emphysema lung ([place]) 08/18/2017   Noted on CT chest   Gastric stenosis 08/27/2016   at pylorus, noted on endoscopy   GERD (gastroesophageal reflux disease)    Hydrocele, bilateral 01/30/2017   Noted on CT chest   Hyperlipidemia    Myalgias with Zocor, Crestor, atorvastatin.    Nicotine abuse    PAF (paroxysmal atrial fibrillation) ([facility])    s/p ablation by Dr Roxan Hockey at  [facility] in 2012 // Xarelto anticoagulation  //  ILR removed 2/17 // Event Monitor 11/17: Predominantly NSR (90%). Paroxysmal atrial fibrillation (10%).  Atrial fibrillation appears to be rate-controlled when it occurs.    Prostate cancer ([place]) dx 2019   Pulmonary nodule, right 08/18/2017   6 mm right upper lobe, 5.5 mm right middle lobe, Noted on CT Chest   Pyloric stenosis    Thoracic ascending aortic aneurysm 01/30/2017   3.9 cm    Past Surgical History:  Procedure Laterality Date   ATRIAL FIBRILLATION ABLATION   07/2010   Afib ablation by Dr Roxan Hockey at [facility] in 2012   BALLOON DILATION N/A 07/31/2016   Procedure: Larrie Kass DILATION;  Surgeon: Manus Gunning, MD;  [facility];  Service: Gastroenterology;  Laterality: N/A;   BALLOON DILATION N/A 08/27/2016   Procedure: BALLOON DILATION;  Surgeon: Manus Gunning, MD;  [facility]: [facility];  Service: Gastroenterology;  Laterality: N/A;   CARDIAC CATHETERIZATION     CARDIAC CATHETERIZATION N/A 08/17/2015   Procedure: Left Heart Cath and Coronary Angiography;  Surgeon: Larey Dresser, MD;  Location: [place] CV LAB;  Service: Cardiovascular;  Laterality: N/A;   COLONOSCOPY W/ POLYPECTOMY  06/26/2016   CORONARY STENT PLACEMENT     x 2   EP IMPLANTABLE DEVICE N/A 03/09/2015   Procedure: Loop Recorder Removal;  Surgeon: [name], MD;  Location: [place] CV LAB;  Service: Cardiovascular;  Laterality: N/A;   ESOPHAGOGASTRODUODENOSCOPY N/A 07/31/2016   Procedure: ESOPHAGOGASTRODUODENOSCOPY (EGD) also have pediatric scope available;  Surgeon: Manus Gunning, MD;  [facility];  Service: Gastroenterology;  Laterality: N/A;   ESOPHAGOGASTRODUODENOSCOPY N/A 08/27/2016   Procedure: ESOPHAGOGASTRODUODENOSCOPY (EGD);  Surgeon: Manus Gunning, MD;  Location: [facility];  Service: Gastroenterology;  Laterality: N/A;   implantable loop recorder implantation  08/13/2010   MDT Reveal XT implanted by Dr Roxan Hockey for afib management post ablation   LEFT HEART CATH N/A 07/15/2013   Procedure: LEFT HEART CATH;  Surgeon: Clent Demark, MD;  Location: [facility] CATH LAB;  Service: Cardiovascular;  Laterality: N/A;   loop recorder removed  2017   RADIOACTIVE SEED IMPLANT N/A 11/27/2017   Procedure: RADIOACTIVE SEED IMPLANT/BRACHYTHERAPY IMPLANT;  Surgeon: Kathie Rhodes, MD;  Location: [place];  Service: Urology;  Laterality: N/A;   SPACE OAR INSTILLATION N/A 11/27/2017   Procedure: SPACE OAR  INSTILLATION;  Surgeon: Kathie Rhodes, MD;  Location: [facility];  Service: Urology;  Laterality: N/A;   TONSILLECTOMY  age 89    Social History   Socioeconomic History   Marital status: Married    Spouse name: Not on file   Number of children: 1   Years of education: Not on file   Highest education level: Not on file  Occupational History   Occupation: retired  Tobacco Use   Smoking status: Former    Packs/day: 0.25    Years: 30.00    Pack years: 7.50    Types: Cigarettes    Quit date: 07/21/2003    Years since quitting: 17.2   Smokeless tobacco: Never  Vaping Use   Vaping Use: Never used  Substance and Sexual Activity   Alcohol use: Yes    Alcohol/week: 1.0 standard drink    Types: 1 Glasses of wine per week    Comment: occasional wine or beer   Drug use: No    Comment: marijuana use in past several yrs ago   Sexual activity: Yes  Other Topics Concern  Not on file  Social History Narrative   Pt lives in [place] with wife.  Retired from [employer].  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  One son.    Social Determinants of Health   Financial Resource Strain: Not on file  Food Insecurity: Not on file  Transportation Needs: Not on file  Physical Activity: Not on file  Stress: Not on file  Social Connections: Not on file  Intimate Partner Violence: Not on file    Outpatient Encounter Medications as of 10/25/2020  Medication Sig   acetaminophen (TYLENOL) 325 MG tablet Take 325 mg by mouth every 6 (six) hours as needed (FOR PAIN.).   Alirocumab (PRALUENT) 75 MG/ML SOAJ Inject 75 mg into the skin every 14 (fourteen) days.   apixaban (ELIQUIS) 5 MG TABS tablet Take 1 tablet (5 mg total) by mouth 2 (two) times daily.   Cholecalciferol 100 MCG (4000 UT) CAPS Take 4,000 Units by mouth daily.   dextromethorphan-guaiFENesin (MUCINEX DM) 30-600 MG 12hr tablet Take 1 tablet by mouth 2 (two) times daily as needed for cough.    losartan (COZAAR) 25 MG tablet Take 1 tablet (25 mg total) by mouth daily.   No facility-administered encounter medications on file as of 10/25/2020.    Allergies  Allergen Reactions   Clopidogrel Other (See Comments)    unknown reaction   Crestor [Rosuvastatin Calcium] Other (See Comments)    Extreme muscular weakness   Lipitor [Atorvastatin Calcium] Other (See Comments)    Extreme muscular weakness    Statins Other (See Comments)    Extreme muscular weakness     Review of Systems  Constitutional:  Positive for chills. Negative for activity change, appetite change, diaphoresis, fatigue, fever and unexpected weight change.  HENT:  Positive for congestion, postnasal drip, rhinorrhea and sore throat. Negative for dental problem, drooling, ear discharge, ear pain, facial swelling, hearing loss, mouth sores, nosebleeds, sinus pressure, sinus pain, sneezing, tinnitus, trouble swallowing and voice change.   Eyes: Negative.   Respiratory:  Positive for cough. Negative for chest tightness, shortness of breath and wheezing.   Cardiovascular:  Negative for chest pain, palpitations and leg swelling.  Gastrointestinal:  Negative for abdominal pain, blood in stool, constipation, diarrhea, nausea and vomiting.  Endocrine: Negative.   Genitourinary:  Negative for decreased urine volume, difficulty urinating, dysuria, frequency and urgency.  Musculoskeletal:  Negative for arthralgias, joint pain, myalgias and neck pain.  Skin: Negative.  Negative for rash.  Allergic/Immunologic: Negative.   Neurological:  Positive for headaches. Negative for dizziness, tremors, seizures, syncope, facial asymmetry, speech difficulty, weakness, light-headedness and numbness.  Hematological: Negative.   Psychiatric/Behavioral:  Negative for confusion, hallucinations, sleep disturbance and suicidal ideas.   All other systems reviewed and are negative.       Observations/Objective: No vital signs or physical exam,  this was a telephone or virtual health encounter.  Pt alert and oriented, answers all questions appropriately, and able to speak in full sentences.    Assessment and Plan: Dwyne was seen today for uri.  Diagnoses and all orders for this visit:  URI with cough and congestion Ongoing symptoms since attending a festival. Will test for COVID-19. Symptomatic care discussed in detail. If positive, will start antiviral therapy due to age and comorbidities. Pt aware to report any new, worsening, or persistent symptoms.  -     Novel Coronavirus, NAA ([facility])     Follow Up Instructions: Return if symptoms worsen or fail to improve.  I discussed the assessment and treatment plan with the patient. The patient was provided an opportunity to ask questions and all were answered. The patient agreed with the plan and demonstrated an understanding of the instructions.   The patient was advised to call back or seek an in-person evaluation if the symptoms worsen or if the condition fails to improve as anticipated.  The above assessment and management plan was discussed with the patient. The patient verbalized understanding of and has agreed to the management plan. Patient is aware to call the clinic if they develop any new symptoms or if symptoms persist or worsen. Patient is aware when to return to the clinic for a follow-up visit. Patient educated on when it is appropriate to go to the emergency department.    I provided 12 minutes of non-face-to-face time during this encounter. The call started at 1200. The call ended at 1210. The other time was used for coordination of care.    Monia Pouch, FNP-C [facility] [address] [place], [place] [address] [phone] 10/25/2020

## 2020-10-26 LAB — NOVEL CORONAVIRUS, NAA: SARS-CoV-2, NAA: NOT DETECTED

## 2020-10-26 LAB — SARS-COV-2, NAA 2 DAY TAT

## 2020-12-03 ENCOUNTER — Ambulatory Visit: Payer: Medicare PPO | Admitting: Family Medicine

## 2021-01-01 ENCOUNTER — Telehealth: Payer: Self-pay

## 2021-01-01 MED ORDER — REPATHA SURECLICK 140 MG/ML ~~LOC~~ SOAJ
140.0000 mg | SUBCUTANEOUS | 11 refills | Status: DC
Start: 2021-01-01 — End: 2022-01-20

## 2021-01-01 NOTE — Telephone Encounter (Signed)
Called and spoke w/pt and stated that the repatha was approved and we needed to switch from praluent to repatha and the pt voiced understnding. Rxsent

## 2021-01-04 ENCOUNTER — Ambulatory Visit: Payer: Medicare PPO | Admitting: Family Medicine

## 2021-01-04 ENCOUNTER — Encounter: Payer: Self-pay | Admitting: Family Medicine

## 2021-01-04 VITALS — BP 155/82 | HR 51 | Ht 70.0 in | Wt 164.0 lb

## 2021-01-04 DIAGNOSIS — E7800 Pure hypercholesterolemia, unspecified: Secondary | ICD-10-CM

## 2021-01-04 DIAGNOSIS — I119 Hypertensive heart disease without heart failure: Secondary | ICD-10-CM

## 2021-01-04 DIAGNOSIS — I480 Paroxysmal atrial fibrillation: Secondary | ICD-10-CM

## 2021-01-04 DIAGNOSIS — Z789 Other specified health status: Secondary | ICD-10-CM

## 2021-01-04 DIAGNOSIS — Z23 Encounter for immunization: Secondary | ICD-10-CM

## 2021-01-04 MED ORDER — APIXABAN 5 MG PO TABS: 5.0000 mg | ORAL_TABLET | Freq: Two times a day (BID) | ORAL | 3 refills | Status: DC

## 2021-01-04 NOTE — Progress Notes (Signed)
BP (!) 155/82    Pulse (!) 51    Ht _0  (1.778 m)    Wt 164 lb (74.4 kg)    SpO2 99%    BMI 23.53 kg/m    Subjective:   Patient ID: Dustin Bauer, male    DOB: 1945-05-01, 75 y.o.   MRN: [id number]  HPI: Dustin Bauer is a 75 y.o. male presenting on 01/04/2021 for Medical Management of Chronic Issues, Atrial Fibrillation, and Hyperlipidemia   HPI Hyperlipidemia Patient is coming in for recheck of his hyperlipidemia. The patient is currently taking Repatha. They deny any issues with myalgias or history of liver damage from it. They deny any focal numbness or weakness or chest pain.   A. Fib Patient has A. fib and sees cardiology.  He currently takes Eliquis.  Denies any bruising or bleeding.  Hypertension Patient is currently on losartan although admits that he did not take it last night or today, and their blood pressure today is 155/82. Patient denies any lightheadedness or dizziness. Patient denies headaches, blurred vision, chest pains, shortness of breath, or weakness. Denies any side effects from medication and is content with current medication.   Relevant past medical, surgical, family and social history reviewed and updated as indicated. Interim medical history since our last visit reviewed. Allergies and medications reviewed and updated.  Review of Systems  Constitutional:  Negative for chills and fever.  Eyes:  Negative for visual disturbance.  Respiratory:  Negative for shortness of breath and wheezing.   Cardiovascular:  Negative for chest pain and leg swelling.  Musculoskeletal:  Negative for back pain and gait problem.  Skin:  Negative for rash.  Neurological:  Negative for dizziness, weakness and light-headedness.  All other systems reviewed and are negative.  Per HPI unless specifically indicated above   Allergies as of 01/04/2021       Reactions   Clopidogrel Other (See Comments)   unknown reaction   Crestor [rosuvastatin Calcium] Other (See Comments)    Extreme muscular weakness   Lipitor [atorvastatin Calcium] Other (See Comments)   Extreme muscular weakness   Statins Other (See Comments)   Extreme muscular weakness        Medication List        Accurate as of January 04, 2021 11:52 AM. If you have any questions, ask your nurse or doctor.          acetaminophen 325 MG tablet Commonly known as: TYLENOL Take 325 mg by mouth every 6 (six) hours as needed (FOR PAIN.).   apixaban 5 MG Tabs tablet Commonly known as: ELIQUIS Take 1 tablet (5 mg total) by mouth 2 (two) times daily.   Cholecalciferol 100 MCG (4000 UT) Caps Take 4,000 Units by mouth daily.   dextromethorphan-guaiFENesin 30-600 MG 12hr tablet Commonly known as: MUCINEX DM Take 1 tablet by mouth 2 (two) times daily as needed for cough.   losartan 25 MG tablet Commonly known as: COZAAR Take 1 tablet (25 mg total) by mouth daily.   Repatha SureClick 287 MG/ML Soaj Generic drug: Evolocumab Inject 140 mg into the skin every 14 (fourteen) days.         Objective:   BP (!) 155/82    Pulse (!) 51    Ht _1  (1.778 m)    Wt 164 lb (74.4 kg)    SpO2 99%    BMI 23.53 kg/m   Wt Readings from Last 3 Encounters:  01/04/21 164 lb (74.4 kg)  10/17/20 162 lb (73.5 kg)  05/31/20 158 lb (71.7 kg)    Physical Exam Vitals and nursing note reviewed.  Constitutional:      General: He is not in acute distress.    Appearance: He is well-developed. He is not diaphoretic.  Eyes:     General: No scleral icterus.    Conjunctiva/sclera: Conjunctivae normal.  Neck:     Thyroid: No thyromegaly.  Cardiovascular:     Rate and Rhythm: Normal rate. Rhythm irregular.     Heart sounds: Normal heart sounds. No murmur heard. Pulmonary:     Effort: Pulmonary effort is normal. No respiratory distress.     Breath sounds: Normal breath sounds. No wheezing.  Musculoskeletal:        General: Normal range of motion.     Cervical back: Neck supple.  Lymphadenopathy:      Cervical: No cervical adenopathy.  Skin:    General: Skin is warm and dry.     Findings: No rash.  Neurological:     Mental Status: He is alert and oriented to person, place, and time.     Coordination: Coordination normal.  Psychiatric:        Behavior: Behavior normal.      Assessment & Plan:   Problem List Items Addressed This Visit       Cardiovascular and Mediastinum   Paroxysmal atrial fibrillation (HCC)   Relevant Medications   apixaban (ELIQUIS) 5 MG TABS tablet   Other Relevant Orders   CBC with Differential/Platelet   Hypertensive cardiovascular disease   Relevant Medications   apixaban (ELIQUIS) 5 MG TABS tablet   Other Relevant Orders   CMP14+EGFR     Other   Statin intolerance   Hyperlipidemia - Primary   Relevant Medications   apixaban (ELIQUIS) 5 MG TABS tablet   Other Relevant Orders   CMP14+EGFR   Lipid panel   Other Visit Diagnoses     Need for shingles vaccine       Relevant Orders   Varicella-zoster vaccine IM (Shingrix) (Completed)       Patient sees cardiology and takes Repatha, had been on Praluent but is starting Repatha  BP elevated, patient admits he has not been taking his medicine that was just started consistently, recommended he take it more consistently. Follow up plan: Return in about 6 months (around 07/05/2021), or if symptoms worsen or fail to improve, for Hypertension and hyperlipidemia and A. fib.  Counseling provided for all of the vaccine components Orders Placed This Encounter  Procedures   Varicella-zoster vaccine IM (Shingrix)   CBC with Differential/Platelet   CMP14+EGFR   Lipid panel    Caryl Pina, MD [facility] 01/04/2021, 11:52 AM

## 2021-01-05 LAB — CMP14+EGFR
ALT: 14 IU/L (ref 0–44)
AST: 15 IU/L (ref 0–40)
Albumin/Globulin Ratio: 1.9 (ref 1.2–2.2)
Albumin: 4.2 g/dL (ref 3.7–4.7)
Alkaline Phosphatase: 105 IU/L (ref 44–121)
BUN/Creatinine Ratio: 18 (ref 10–24)
BUN: 17 mg/dL (ref 8–27)
Bilirubin Total: 0.3 mg/dL (ref 0.0–1.2)
CO2: 24 mmol/L (ref 20–29)
Calcium: 9.8 mg/dL (ref 8.6–10.2)
Chloride: 106 mmol/L (ref 96–106)
Creatinine, Ser: 0.92 mg/dL (ref 0.76–1.27)
Globulin, Total: 2.2 g/dL (ref 1.5–4.5)
Glucose: 103 mg/dL — ABNORMAL HIGH (ref 70–99)
Potassium: 4.6 mmol/L (ref 3.5–5.2)
Sodium: 142 mmol/L (ref 134–144)
Total Protein: 6.4 g/dL (ref 6.0–8.5)
eGFR: 87 mL/min/{1.73_m2} (ref >59)

## 2021-01-05 LAB — LIPID PANEL
Chol/HDL Ratio: 2.4 ratio (ref 0.0–5.0)
Cholesterol, Total: 116 mg/dL (ref 100–199)
HDL: 49 mg/dL (ref >39)
LDL Chol Calc (NIH): 53 mg/dL (ref 0–99)
Triglycerides: 69 mg/dL (ref 0–149)
VLDL Cholesterol Cal: 14 mg/dL (ref 5–40)

## 2021-01-05 LAB — CBC WITH DIFFERENTIAL/PLATELET
Basophils Absolute: 0 10*3/uL (ref 0.0–0.2)
Basos: 1 %
EOS (ABSOLUTE): 0.4 10*3/uL (ref 0.0–0.4)
Eos: 5 %
Hematocrit: 41.2 % (ref 37.5–51.0)
Hemoglobin: 14 g/dL (ref 13.0–17.7)
Immature Grans (Abs): 0 10*3/uL (ref 0.0–0.1)
Immature Granulocytes: 0 %
Lymphocytes Absolute: 2.1 10*3/uL (ref 0.7–3.1)
Lymphs: 27 %
MCH: 31.2 pg (ref 26.6–33.0)
MCHC: 34 g/dL (ref 31.5–35.7)
MCV: 92 fL (ref 79–97)
Monocytes Absolute: 0.6 10*3/uL (ref 0.1–0.9)
Monocytes: 8 %
Neutrophils Absolute: 4.6 10*3/uL (ref 1.4–7.0)
Neutrophils: 59 %
Platelets: 222 10*3/uL (ref 150–450)
RBC: 4.49 x10E6/uL (ref 4.14–5.80)
RDW: 13.5 % (ref 11.6–15.4)
WBC: 7.6 10*3/uL (ref 3.4–10.8)

## 2021-04-15 DIAGNOSIS — Z8546 Personal history of malignant neoplasm of prostate: Secondary | ICD-10-CM | POA: Diagnosis not present

## 2021-05-28 DIAGNOSIS — L570 Actinic keratosis: Secondary | ICD-10-CM | POA: Diagnosis not present

## 2021-05-28 DIAGNOSIS — Z85828 Personal history of other malignant neoplasm of skin: Secondary | ICD-10-CM | POA: Diagnosis not present

## 2021-05-28 DIAGNOSIS — D0472 Carcinoma in situ of skin of left lower limb, including hip: Secondary | ICD-10-CM | POA: Diagnosis not present

## 2021-05-28 DIAGNOSIS — L814 Other melanin hyperpigmentation: Secondary | ICD-10-CM | POA: Diagnosis not present

## 2021-05-28 DIAGNOSIS — D485 Neoplasm of uncertain behavior of skin: Secondary | ICD-10-CM | POA: Diagnosis not present

## 2021-05-28 DIAGNOSIS — L820 Inflamed seborrheic keratosis: Secondary | ICD-10-CM | POA: Diagnosis not present

## 2021-05-28 DIAGNOSIS — L821 Other seborrheic keratosis: Secondary | ICD-10-CM | POA: Diagnosis not present

## 2021-05-28 DIAGNOSIS — L579 Skin changes due to chronic exposure to nonionizing radiation, unspecified: Secondary | ICD-10-CM | POA: Diagnosis not present

## 2021-06-25 DIAGNOSIS — D0472 Carcinoma in situ of skin of left lower limb, including hip: Secondary | ICD-10-CM | POA: Diagnosis not present

## 2021-06-25 DIAGNOSIS — L905 Scar conditions and fibrosis of skin: Secondary | ICD-10-CM | POA: Diagnosis not present

## 2021-07-03 ENCOUNTER — Encounter: Payer: Self-pay | Admitting: Family Medicine

## 2021-07-03 ENCOUNTER — Ambulatory Visit: Payer: Medicare PPO | Admitting: Family Medicine

## 2021-07-03 VITALS — BP 128/69 | HR 54 | Temp 98.3°F | Ht 70.0 in | Wt 157.0 lb

## 2021-07-03 DIAGNOSIS — I25119 Atherosclerotic heart disease of native coronary artery with unspecified angina pectoris: Secondary | ICD-10-CM | POA: Diagnosis not present

## 2021-07-03 DIAGNOSIS — R972 Elevated prostate specific antigen [PSA]: Secondary | ICD-10-CM

## 2021-07-03 DIAGNOSIS — I480 Paroxysmal atrial fibrillation: Secondary | ICD-10-CM

## 2021-07-03 DIAGNOSIS — I119 Hypertensive heart disease without heart failure: Secondary | ICD-10-CM | POA: Diagnosis not present

## 2021-07-03 DIAGNOSIS — N529 Male erectile dysfunction, unspecified: Secondary | ICD-10-CM | POA: Diagnosis not present

## 2021-07-03 DIAGNOSIS — Z789 Other specified health status: Secondary | ICD-10-CM | POA: Diagnosis not present

## 2021-07-03 DIAGNOSIS — E7800 Pure hypercholesterolemia, unspecified: Secondary | ICD-10-CM

## 2021-07-03 MED ORDER — SILDENAFIL CITRATE 20 MG PO TABS: 20.0000 mg | ORAL_TABLET | ORAL | 1 refills | Status: DC | PRN

## 2021-07-03 NOTE — Progress Notes (Signed)
BP 128/69   Pulse (!) 54   Temp 98.3 F (36.8 C)   Ht _0  (1.778 m)   Wt 157 lb (71.2 kg)   SpO2 100%   BMI 22.53 kg/m    Subjective:   Patient ID: Dustin Bauer, male    DOB: 1945/04/15, 76 y.o.   MRN: [id number]  HPI: Dustin Bauer is a 76 y.o. male presenting on 07/03/2021 for Medical Management of Chronic Issues and Hyperlipidemia   HPI Hyperlipidemia Patient is coming in for recheck of his hyperlipidemia. The patient is currently taking Repatha. They deny any issues with myalgias or history of liver damage from it. They deny any focal numbness or weakness or chest pain.   A-fib and CAD Patient has A-fib and CAD and hypertension and sees cardiology for these.  Hypertension Patient is currently on losartan, and their blood pressure today is 128/69. Patient denies any lightheadedness or dizziness. Patient denies headaches, blurred vision, chest pains, shortness of breath, or weakness. Denies any side effects from medication and is content with current medication.   Relevant past medical, surgical, family and social history reviewed and updated as indicated. Interim medical history since our last visit reviewed. Allergies and medications reviewed and updated.  Review of Systems  Constitutional:  Negative for chills and fever.  Eyes:  Negative for visual disturbance.  Respiratory:  Negative for shortness of breath and wheezing.   Cardiovascular:  Negative for chest pain and leg swelling.  Musculoskeletal:  Negative for back pain and gait problem.  Skin:  Negative for rash.  Neurological:  Negative for dizziness, weakness and light-headedness.  All other systems reviewed and are negative.   Per HPI unless specifically indicated above   Allergies as of 07/03/2021       Reactions   Clopidogrel Other (See Comments)   unknown reaction   Crestor [rosuvastatin Calcium] Other (See Comments)   Extreme muscular weakness   Lipitor [atorvastatin Calcium] Other (See Comments)    Extreme muscular weakness   Statins Other (See Comments)   Extreme muscular weakness        Medication List        Accurate as of July 03, 2021  2:14 PM. If you have any questions, ask your nurse or doctor.          acetaminophen 325 MG tablet Commonly known as: TYLENOL Take 325 mg by mouth every 6 (six) hours as needed (FOR PAIN.).   apixaban 5 MG Tabs tablet Commonly known as: ELIQUIS Take 1 tablet (5 mg total) by mouth 2 (two) times daily.   Cholecalciferol 100 MCG (4000 UT) Caps Take 4,000 Units by mouth daily.   dextromethorphan-guaiFENesin 30-600 MG 12hr tablet Commonly known as: MUCINEX DM Take 1 tablet by mouth 2 (two) times daily as needed for cough.   losartan 25 MG tablet Commonly known as: COZAAR Take 1 tablet (25 mg total) by mouth daily.   Repatha SureClick 073 MG/ML Soaj Generic drug: Evolocumab Inject 140 mg into the skin every 14 (fourteen) days.   sildenafil 20 MG tablet Commonly known as: REVATIO Take 1-5 tablets (20-100 mg total) by mouth as needed. Started by: Fransisca Kaufmann Ayesha Markwell, MD         Objective:   BP 128/69   Pulse (!) 54   Temp 98.3 F (36.8 C)   Ht _1  (1.778 m)   Wt 157 lb (71.2 kg)   SpO2 100%   BMI 22.53 kg/m   Wt Readings  from Last 3 Encounters:  07/03/21 157 lb (71.2 kg)  01/04/21 164 lb (74.4 kg)  10/17/20 162 lb (73.5 kg)    Physical Exam Vitals and nursing note reviewed.  Constitutional:      General: He is not in acute distress.    Appearance: He is well-developed. He is not diaphoretic.  Eyes:     General: No scleral icterus.    Conjunctiva/sclera: Conjunctivae normal.  Neck:     Thyroid: No thyromegaly.  Cardiovascular:     Rate and Rhythm: Normal rate and regular rhythm.     Heart sounds: Normal heart sounds. No murmur heard. Pulmonary:     Effort: Pulmonary effort is normal. No respiratory distress.     Breath sounds: Normal breath sounds. No wheezing.  Musculoskeletal:        General:  Normal range of motion.     Cervical back: Neck supple.  Lymphadenopathy:     Cervical: No cervical adenopathy.  Skin:    General: Skin is warm and dry.     Findings: No rash.  Neurological:     Mental Status: He is alert and oriented to person, place, and time.     Coordination: Coordination normal.  Psychiatric:        Behavior: Behavior normal.       Assessment & Plan:   Problem List Items Addressed This Visit       Cardiovascular and Mediastinum   Paroxysmal atrial fibrillation (HCC)   Relevant Medications   sildenafil (REVATIO) 20 MG tablet   Hypertensive cardiovascular disease   Relevant Medications   sildenafil (REVATIO) 20 MG tablet   Other Relevant Orders   CBC with Differential/Platelet   CMP14+EGFR   Lipid panel   CAD (coronary artery disease)   Relevant Medications   sildenafil (REVATIO) 20 MG tablet     Other   Hyperlipidemia - Primary   Relevant Medications   sildenafil (REVATIO) 20 MG tablet   Other Relevant Orders   CBC with Differential/Platelet   CMP14+EGFR   Lipid panel   Statin intolerance   Elevated PSA   Relevant Orders   PSA, total and free   Other Visit Diagnoses     Erectile dysfunction, unspecified erectile dysfunction type       Relevant Medications   sildenafil (REVATIO) 20 MG tablet       Patient has had sildenafil in the past and wants to get refill on it, his prescription that he has is very old and he just wants to use as needed for erectile dysfunction.  We will check blood work today.  Blood pressure and everything looks good.  He continues to follow with cardiology. Follow up plan: Return in about 6 months (around 01/02/2022), or if symptoms worsen or fail to improve, for Hypertension and CAD and hyperlipidemia.  Counseling provided for all of the vaccine components Orders Placed This Encounter  Procedures   CBC with Differential/Platelet   CMP14+EGFR   Lipid panel   PSA, total and free    Caryl Pina,  MD [facility] 07/03/2021, 2:14 PM

## 2021-07-04 LAB — CMP14+EGFR
ALT: 11 IU/L (ref 0–44)
AST: 16 IU/L (ref 0–40)
Albumin/Globulin Ratio: 1.8 (ref 1.2–2.2)
Albumin: 4.2 g/dL (ref 3.7–4.7)
Alkaline Phosphatase: 98 IU/L (ref 44–121)
BUN/Creatinine Ratio: 19 (ref 10–24)
BUN: 18 mg/dL (ref 8–27)
Bilirubin Total: 0.4 mg/dL (ref 0.0–1.2)
CO2: 23 mmol/L (ref 20–29)
Calcium: 9.8 mg/dL (ref 8.6–10.2)
Chloride: 104 mmol/L (ref 96–106)
Creatinine, Ser: 0.95 mg/dL (ref 0.76–1.27)
Globulin, Total: 2.4 g/dL (ref 1.5–4.5)
Glucose: 100 mg/dL — ABNORMAL HIGH (ref 70–99)
Potassium: 5.1 mmol/L (ref 3.5–5.2)
Sodium: 141 mmol/L (ref 134–144)
Total Protein: 6.6 g/dL (ref 6.0–8.5)
eGFR: 83 mL/min/{1.73_m2} (ref >59)

## 2021-07-04 LAB — CBC WITH DIFFERENTIAL/PLATELET
Basophils Absolute: 0.1 10*3/uL (ref 0.0–0.2)
Basos: 1 %
EOS (ABSOLUTE): 0.3 10*3/uL (ref 0.0–0.4)
Eos: 3 %
Hematocrit: 43.8 % (ref 37.5–51.0)
Hemoglobin: 14.7 g/dL (ref 13.0–17.7)
Immature Grans (Abs): 0 10*3/uL (ref 0.0–0.1)
Immature Granulocytes: 0 %
Lymphocytes Absolute: 2 10*3/uL (ref 0.7–3.1)
Lymphs: 25 %
MCH: 31.1 pg (ref 26.6–33.0)
MCHC: 33.6 g/dL (ref 31.5–35.7)
MCV: 93 fL (ref 79–97)
Monocytes Absolute: 0.7 10*3/uL (ref 0.1–0.9)
Monocytes: 9 %
Neutrophils Absolute: 4.9 10*3/uL (ref 1.4–7.0)
Neutrophils: 62 %
Platelets: 220 10*3/uL (ref 150–450)
RBC: 4.72 x10E6/uL (ref 4.14–5.80)
RDW: 13.6 % (ref 11.6–15.4)
WBC: 7.9 10*3/uL (ref 3.4–10.8)

## 2021-07-04 LAB — PSA, TOTAL AND FREE
PSA, Free: <0.02 ng/mL
Prostate Specific Ag, Serum: <0.1 ng/mL (ref 0.0–4.0)

## 2021-07-04 LAB — LIPID PANEL
Chol/HDL Ratio: 2.1 ratio (ref 0.0–5.0)
Cholesterol, Total: 109 mg/dL (ref 100–199)
HDL: 51 mg/dL (ref >39)
LDL Chol Calc (NIH): 43 mg/dL (ref 0–99)
Triglycerides: 74 mg/dL (ref 0–149)
VLDL Cholesterol Cal: 15 mg/dL (ref 5–40)

## 2021-07-09 ENCOUNTER — Telehealth: Payer: Self-pay

## 2021-07-09 NOTE — Telephone Encounter (Signed)
Received PA through covermymeds for Sildenafil 20mg   Key [id number]  Pt will need to pay out of pocket for medication.  Local pharmacy informed.

## 2021-08-05 ENCOUNTER — Encounter: Payer: Self-pay | Admitting: Gastroenterology

## 2021-08-12 ENCOUNTER — Encounter: Payer: Self-pay | Admitting: Gastroenterology

## 2021-08-20 DIAGNOSIS — H43813 Vitreous degeneration, bilateral: Secondary | ICD-10-CM | POA: Diagnosis not present

## 2021-08-20 DIAGNOSIS — H02834 Dermatochalasis of left upper eyelid: Secondary | ICD-10-CM | POA: Diagnosis not present

## 2021-08-20 DIAGNOSIS — H40013 Open angle with borderline findings, low risk, bilateral: Secondary | ICD-10-CM | POA: Diagnosis not present

## 2021-08-20 DIAGNOSIS — H02831 Dermatochalasis of right upper eyelid: Secondary | ICD-10-CM | POA: Diagnosis not present

## 2021-08-20 DIAGNOSIS — H04123 Dry eye syndrome of bilateral lacrimal glands: Secondary | ICD-10-CM | POA: Diagnosis not present

## 2021-08-20 DIAGNOSIS — H2513 Age-related nuclear cataract, bilateral: Secondary | ICD-10-CM | POA: Diagnosis not present

## 2021-08-23 ENCOUNTER — Encounter: Payer: Self-pay | Admitting: Gastroenterology

## 2021-10-03 ENCOUNTER — Ambulatory Visit: Payer: Medicare PPO | Admitting: Gastroenterology

## 2021-10-08 NOTE — Progress Notes (Unsigned)
Cardiology Office Note   Date:  10/09/2021 I reviewed Dustin Bauer with risk  ID:  Dustin Bauer, DOB Jun 18, 1945, MRN [id number]  PCP:  Dettinger, Fransisca Kaufmann, MD  Cardiologist:   Minus Breeding, MD   Chief Complaint  Patient presents with   Coronary Artery Disease     History of Present Illness: Dustin Bauer is a 76 y.o. male who presents for follow up of atrial fib.  He is s/p ablation at [facility], CAD with PCI to the LAD 2011, inferoposterior STEMI in 2015 requiring DES to occluded RCA (100%), with EF of 01%-02% complicated by CHB which resolved without need for PPM implantation. Repeat LHC in 2017 revealing patent RCA and LAD. HR in the 40's.  He had a small fixed moderate intensity mid anterospetal perfusion defect in 2020.    Since I saw him he goes to the gym 2 times a week.  He hikes with his dogs.  He has been having symptoms of some lightheadedness and perhaps some mild discomfort and shortness of breath with exercise that is reminiscent of his previous angina.  He never had classic symptoms.  He thinks this is increased in frequency and intensity since I last saw him though it is very difficult for him to quantify or qualify.  He is not having any new resting shortness of breath, PND or orthopnea.  He had no new palpitations, presyncope or syncope.  He does have a low heart rate but has not had any symptoms necessarily related to this.  He has not had any symptoms consistent with his previous atrial fibrillation.   Past Medical History:  Diagnosis Date   AAA (abdominal aortic aneurysm) ([place]) 01/20/2017   3.9cm infrarenal   Allergy to ACE inhibitors    Cough and fatigue with ACEI   Aortic atherosclerosis ([place]) 08/18/2017   Noted on CT chest   CAD (coronary artery disease) stent x 1 2011 and 1  2014   a. LHC in 2011 for stable angina >> DES to pLAD [facility])  //  b. inf-post STEMI 7/15 c/b CHB with jxn escape >> LHC: OM1 60-70, pLAD 30-40, m-d RCA occluded, EF 50-55 >> PCI: 3 x 33  mm Xience DES to RCA  //  c. LHC 8/17: dLM 20, pLAD 20, pLAD stent ok, pRI 30, pRCA stent ok with 30% ISR   Colon polyps    Diverticulosis    Emphysema lung ([place]) 08/18/2017   Noted on CT chest   Gastric stenosis 08/27/2016   at pylorus, noted on endoscopy   GERD (gastroesophageal reflux disease)    Hydrocele, bilateral 01/30/2017   Noted on CT chest   Hyperlipidemia    Myalgias with Zocor, Crestor, atorvastatin.    Nicotine abuse    PAF (paroxysmal atrial fibrillation) [facility])    s/p ablation by Dr Roxan Hockey at [facility] in 2012 // Xarelto anticoagulation  //  ILR removed 2/17 // Event Monitor 11/17: Predominantly NSR (90%). Paroxysmal atrial fibrillation (10%).  Atrial fibrillation appears to be rate-controlled when it occurs.    Prostate cancer ([place]) dx 2019   Pulmonary nodule, right 08/18/2017   6 mm right upper lobe, 5.5 mm right middle lobe, Noted on CT Chest   Pyloric stenosis    Thoracic ascending aortic aneurysm ([place]) 01/30/2017   3.9 cm    Past Surgical History:  Procedure Laterality Date   ATRIAL FIBRILLATION ABLATION  07/2010   Afib ablation by Dr Roxan Hockey at [facility] in  2012   BALLOON DILATION N/A 07/31/2016   Procedure: BALLOON DILATION;  Surgeon: Manus Gunning, MD;  Location: [facility];  Service: Gastroenterology;  Laterality: N/A;   BALLOON DILATION N/A 08/27/2016   Procedure: BALLOON DILATION;  Surgeon: Manus Gunning, MD;  Location: [facility];  Service: Gastroenterology;  Laterality: N/A;   CARDIAC CATHETERIZATION     CARDIAC CATHETERIZATION N/A 08/17/2015   Procedure: Left Heart Cath and Coronary Angiography;  Surgeon: Larey Dresser, MD;  Location: [facility];  Service: Cardiovascular;  Laterality: N/A;   COLONOSCOPY W/ POLYPECTOMY  06/26/2016   CORONARY STENT PLACEMENT     x 2   EP IMPLANTABLE DEVICE N/A 03/09/2015   Procedure: Loop Recorder Removal;  Surgeon: [name], MD;  Location: [facility];  Service: Cardiovascular;   Laterality: N/A;   ESOPHAGOGASTRODUODENOSCOPY N/A 07/31/2016   Procedure: ESOPHAGOGASTRODUODENOSCOPY (EGD) also have pediatric scope available;  Surgeon: Manus Gunning, MD;  Location: [facility];  Service: Gastroenterology;  Laterality: N/A;   ESOPHAGOGASTRODUODENOSCOPY N/A 08/27/2016   Procedure: ESOPHAGOGASTRODUODENOSCOPY (EGD);  Surgeon: Manus Gunning, MD;  Location: [facility];  Service: Gastroenterology;  Laterality: N/A;   implantable loop recorder implantation  08/13/2010   MDT Reveal XT implanted by Dr Roxan Hockey for afib management post ablation   LEFT HEART CATH N/A 07/15/2013   Procedure: LEFT HEART CATH;  Surgeon: Clent Demark, MD;  Location: [facility] CATH LAB;  Service: Cardiovascular;  Laterality: N/A;   loop recorder removed  2017   RADIOACTIVE SEED IMPLANT N/A 11/27/2017   Procedure: RADIOACTIVE SEED IMPLANT/BRACHYTHERAPY IMPLANT;  Surgeon: Kathie Rhodes, MD;  Location: [facility];  Service: Urology;  Laterality: N/A;   SPACE OAR INSTILLATION N/A 11/27/2017   Procedure: SPACE OAR INSTILLATION;  Surgeon: Kathie Rhodes, MD;  Location[facility];  Service: Urology;  Laterality: N/A;   TONSILLECTOMY  age 76     Current Outpatient Medications  Medication Sig Dispense Refill   acetaminophen (TYLENOL) 325 MG tablet Take 325 mg by mouth every 6 (six) hours as needed (FOR PAIN.).     apixaban (ELIQUIS) 5 MG TABS tablet Take 1 tablet (5 mg total) by mouth 2 (two) times daily. 180 tablet 3   Cholecalciferol 100 MCG (4000 UT) CAPS Take 4,000 Units by mouth daily.     dextromethorphan-guaiFENesin (MUCINEX DM) 30-600 MG 12hr tablet Take 1 tablet by mouth 2 (two) times daily as needed for cough.     Evolocumab (REPATHA SURECLICK) 700 MG/ML SOAJ Inject 140 mg into the skin every 14 (fourteen) days. 2 mL 11   losartan (COZAAR) 25 MG tablet Take 1 tablet (25 mg total) by mouth daily. 90 tablet 3   sildenafil (REVATIO) 20 MG tablet Take 1-5  tablets (20-100 mg total) by mouth as needed. 50 tablet 1   No current facility-administered medications for this visit.    Allergies:   Clopidogrel, Crestor [rosuvastatin calcium], Lipitor [atorvastatin calcium], and Statins    ROS:  Please see the history of present illness.   Otherwise, review of systems are positive for none.   All other systems are reviewed and negative.    PHYSICAL EXAM: VS:  BP (!) 150/82   Pulse (!) 46   Ht '5\' 10"'$  (1.778 m)   Wt 158 lb (71.7 kg)   BMI 22.67 kg/m  , BMI Body mass index is 22.67 kg/m. GENERAL:  Well appearing NECK:  No jugular venous distention, waveform within normal limits, carotid upstroke brisk and  symmetric, no bruits, no thyromegaly LUNGS:  Clear to auscultation bilaterally CHEST:  Unremarkable HEART:  PMI not displaced or sustained,S1 and S2 within normal limits, no S3, no S4, no clicks, no rubs, no murmurs ABD:  Flat, positive bowel sounds normal in frequency in pitch, no bruits, no rebound, no guarding, no midline pulsatile mass, no hepatomegaly, no splenomegaly EXT:  2 plus pulses throughout, no edema, no cyanosis no clubbing  EKG:  EKG is  ordered today. Sinus rhythm, rate 46, premature atrial contractions, nonspecific T wave changes no change from previous  Recent Labs: 07/03/2021: ALT 11; BUN 18; Creatinine, Ser 0.95; Hemoglobin 14.7; Platelets 220; Potassium 5.1; Sodium 141    Lipid Panel    Component Value Date/Time   CHOL 109 07/03/2021 1420   CHOL 100 (L) 12/28/2014 1609   TRIG 74 07/03/2021 1420   TRIG 95 06/04/2015 1501   HDL 51 07/03/2021 1420   HDL 55 06/04/2015 1501   CHOLHDL 2.1 07/03/2021 1420   CHOLHDL 2.2 11/27/2015 1636   VLDL 17 11/27/2015 1636   LDLCALC 43 07/03/2021 1420   LDLCALC 33 12/28/2014 1609      Wt Readings from Last 3 Encounters:  10/09/21 158 lb (71.7 kg)  07/03/21 157 lb (71.2 kg)  01/04/21 164 lb (74.4 kg)      Other studies Reviewed: Additional studies/ records that were  reviewed today include: Labs Review of the above records demonstrates:  Please see elsewhere in the note.     ASSESSMENT AND PLAN:  CAD: He is very difficult to sort out symptoms.  Given this I am going to screen him with stress testing.  With his bradycardia he would not achieve a target heart rate so he is going to have to have a [employer].  Hypercholesterolemia:  He remains on Praluent injections.  LDL was 43 with an HDL of 51.  No change in therapy.  HTN: His blood pressure is elevated and he is going to keep a blood pressure diary.  I had to convince him take the Cozaar previously but I think he would allow me to increase it to 50 mg once a day if his blood pressure is elevated.    Hx of AAA:   This is followed by [facility].  This is 41 mm.  The following changes have been made:   None  Labs/ tests ordered today include:     Orders Placed This Encounter  Procedures   MYOCARDIAL PERFUSION IMAGING   EKG 12-Lead      Disposition:   FU with me in 12 months or sooner if needed if he has increased symptoms or abnormalities on the stress test.  Signed, Minus Breeding, MD  10/09/2021 1:15 PM    [place]

## 2021-10-09 ENCOUNTER — Encounter: Payer: Self-pay | Admitting: Cardiology

## 2021-10-09 ENCOUNTER — Encounter (HOSPITAL_COMMUNITY): Payer: Self-pay | Admitting: Cardiology

## 2021-10-09 ENCOUNTER — Encounter: Payer: Self-pay | Admitting: *Deleted

## 2021-10-09 ENCOUNTER — Ambulatory Visit (INDEPENDENT_AMBULATORY_CARE_PROVIDER_SITE_OTHER): Payer: Medicare PPO | Admitting: Cardiology

## 2021-10-09 VITALS — BP 150/82 | HR 46 | Ht 70.0 in | Wt 158.0 lb

## 2021-10-09 DIAGNOSIS — R072 Precordial pain: Secondary | ICD-10-CM | POA: Diagnosis not present

## 2021-10-09 DIAGNOSIS — I25119 Atherosclerotic heart disease of native coronary artery with unspecified angina pectoris: Secondary | ICD-10-CM | POA: Diagnosis not present

## 2021-10-09 DIAGNOSIS — I10 Essential (primary) hypertension: Secondary | ICD-10-CM

## 2021-10-09 DIAGNOSIS — E785 Hyperlipidemia, unspecified: Secondary | ICD-10-CM | POA: Diagnosis not present

## 2021-10-09 DIAGNOSIS — R079 Chest pain, unspecified: Secondary | ICD-10-CM

## 2021-10-09 DIAGNOSIS — I480 Paroxysmal atrial fibrillation: Secondary | ICD-10-CM | POA: Diagnosis not present

## 2021-10-09 NOTE — Patient Instructions (Signed)
Medication Instructions:  The current medical regimen is effective;  continue present plan and medications.  *If you need a refill on your cardiac medications before your next appointment, please call your pharmacy*  Testing/Procedures: Your physician has requested that you have a lexiscan myoview. For further information please visit [email or web address]. Please follow instruction sheet, as given.  Follow-Up: At [facility], you and your health needs are our priority.  As part of our continuing mission to provide you with exceptional heart care, we have created designated Provider Care Teams.  These Care Teams include your primary Cardiologist (physician) and Advanced Practice Providers (APPs -  Physician Assistants and Nurse Practitioners) who all work together to provide you with the care you need, when you need it.  We recommend signing up for the patient portal called "MyChart".  Sign up information is provided on this After Visit Summary.  MyChart is used to connect with patients for Virtual Visits (Telemedicine).  Patients are able to view lab/test results, encounter notes, upcoming appointments, etc.  Non-urgent messages can be sent to your provider as well.   To learn more about what you can do with MyChart, go to [email or web address].    Your next appointment:   1 year(s)  The format for your next appointment:   In Person  Provider:   Dr Minus Breeding in [place]

## 2021-10-11 ENCOUNTER — Telehealth (HOSPITAL_COMMUNITY): Payer: Self-pay | Admitting: *Deleted

## 2021-10-11 NOTE — Telephone Encounter (Signed)
Patient given detailed instructions per Myocardial Perfusion Study Information Sheet for the test on 10/18/21 at 10:45. Patient notified to arrive 15 minutes early and that it is imperative to arrive on time for appointment to keep from having the test rescheduled.  If you need to cancel or reschedule your appointment, please call the office within 24 hours of your appointment. . Patient verbalized understanding.Dustin Bauer

## 2021-10-17 NOTE — Progress Notes (Signed)
HISTORY AND PHYSICAL     CC:  follow up. Requesting Provider:  Dettinger, Fransisca Kaufmann, MD  HPI: This is a 76 y.o. male who is here today for follow up for AAA and renal artery stenosis.   He has been followed since 2017 for a 3.9 cm AAA that was found on CT.  He has hx of brachytherapy for prostate cancer.    Pt was last seen 03/09/2020 and at that time, he was doing well without abdominal or back pain.  He was very active going to the gym 2-3 times per week.    The pt returns today for follow up.  He denies any new abdominal or back pain.  He states that he has some muscle soreness in his back from walking his new lab puppy.  He denies any claudication or non healing wounds.  He tells me he was recently placed on Losartan for his blood pressure.  He is unable to take statins but is on Repatha injection without side effects.    He takes eliquis for afib.  He tells me he had an ablation and has been in regular since.   The pt is on a statin for cholesterol management.    (Repatha) The pt is not on an aspirin.    Other AC:  Eliquis The pt is  on ARB for hypertension.  The pt does not have diabetes. Tobacco hx:  former  Pt does not have family hx of AAA. He does have children.   Past Medical History:  Diagnosis Date   AAA (abdominal aortic aneurysm) ([place]) 01/20/2017   3.9cm infrarenal   Allergy to ACE inhibitors    Cough and fatigue with ACEI   Aortic atherosclerosis ([place]) 08/18/2017   Noted on CT chest   CAD (coronary artery disease) stent x 1 2011 and 1  2014   a. LHC in 2011 for stable angina >> DES to pLAD [facility])  //  b. inf-post STEMI 7/15 c/b CHB with jxn escape >> LHC: OM1 60-70, pLAD 30-40, m-d RCA occluded, EF 50-55 >> PCI: 3 x 33 mm Xience DES to RCA  //  c. LHC 8/17: dLM 20, pLAD 20, pLAD stent ok, pRI 30, pRCA stent ok with 30% ISR   Colon polyps    Diverticulosis    Emphysema lung ([place]) 08/18/2017   Noted on CT chest   Gastric stenosis 08/27/2016   at pylorus, noted on  endoscopy   GERD (gastroesophageal reflux disease)    Hydrocele, bilateral 01/30/2017   Noted on CT chest   Hyperlipidemia    Myalgias with Zocor, Crestor, atorvastatin.    Nicotine abuse    PAF (paroxysmal atrial fibrillation) [facility])    s/p ablation by Dr Roxan Hockey at [facility] in 2012 // Xarelto anticoagulation  //  ILR removed 2/17 // Event Monitor 11/17: Predominantly NSR (90%). Paroxysmal atrial fibrillation (10%).  Atrial fibrillation appears to be rate-controlled when it occurs.    Prostate cancer ([place]) dx 2019   Pulmonary nodule, right 08/18/2017   6 mm right upper lobe, 5.5 mm right middle lobe, Noted on CT Chest   Pyloric stenosis    Thoracic ascending aortic aneurysm ([place]) 01/30/2017   3.9 cm    Past Surgical History:  Procedure Laterality Date   ATRIAL FIBRILLATION ABLATION  07/2010   Afib ablation by Dr Roxan Hockey at [facility] in 2012   BALLOON DILATION N/A 07/31/2016   Procedure: Larrie Kass DILATION;  Surgeon: Manus Gunning, MD;  Location: [facility];  Service: Gastroenterology;  Laterality: N/A;   BALLOON DILATION N/A 08/27/2016   Procedure: BALLOON DILATION;  Surgeon: Manus Gunning, MD;  Location: [facility];  Service: Gastroenterology;  Laterality: N/A;   CARDIAC CATHETERIZATION     CARDIAC CATHETERIZATION N/A 08/17/2015   Procedure: Left Heart Cath and Coronary Angiography;  Surgeon: Larey Dresser, MD;  Location: [place] CV LAB;  Service: Cardiovascular;  Laterality: N/A;   COLONOSCOPY W/ POLYPECTOMY  06/26/2016   CORONARY STENT PLACEMENT     x 2   EP IMPLANTABLE DEVICE N/A 03/09/2015   Procedure: Loop Recorder Removal;  Surgeon: [name], MD;  Location: [place] CV LAB;  Service: Cardiovascular;  Laterality: N/A;   ESOPHAGOGASTRODUODENOSCOPY N/A 07/31/2016   Procedure: ESOPHAGOGASTRODUODENOSCOPY (EGD) also have pediatric scope available;  Surgeon: Manus Gunning, MD;  Location: [facility];  Service: Gastroenterology;  Laterality:  N/A;   ESOPHAGOGASTRODUODENOSCOPY N/A 08/27/2016   Procedure: ESOPHAGOGASTRODUODENOSCOPY (EGD);  Surgeon: Manus Gunning, MD;  Location: [facility];  Service: Gastroenterology;  Laterality: N/A;   implantable loop recorder implantation  08/13/2010   MDT Reveal XT implanted by Dr Roxan Hockey for afib management post ablation   LEFT HEART CATH N/A 07/15/2013   Procedure: LEFT HEART CATH;  Surgeon: Clent Demark, MD;  Location: [facility] CATH LAB;  Service: Cardiovascular;  Laterality: N/A;   loop recorder removed  2017   RADIOACTIVE SEED IMPLANT N/A 11/27/2017   Procedure: RADIOACTIVE SEED IMPLANT/BRACHYTHERAPY IMPLANT;  Surgeon: Kathie Rhodes, MD;  Location: [place];  Service: Urology;  Laterality: N/A;   SPACE OAR INSTILLATION N/A 11/27/2017   Procedure: SPACE OAR INSTILLATION;  Surgeon: Kathie Rhodes, MD;  Location: [facility];  Service: Urology;  Laterality: N/A;   TONSILLECTOMY  age 68    Allergies  Allergen Reactions   Clopidogrel Other (See Comments)    unknown reaction   Crestor [Rosuvastatin Calcium] Other (See Comments)    Extreme muscular weakness   Lipitor [Atorvastatin Calcium] Other (See Comments)    Extreme muscular weakness    Statins Other (See Comments)    Extreme muscular weakness     Current Outpatient Medications  Medication Sig Dispense Refill   acetaminophen (TYLENOL) 325 MG tablet Take 325 mg by mouth every 6 (six) hours as needed (FOR PAIN.).     apixaban (ELIQUIS) 5 MG TABS tablet Take 1 tablet (5 mg total) by mouth 2 (two) times daily. 180 tablet 3   Cholecalciferol 100 MCG (4000 UT) CAPS Take 4,000 Units by mouth daily.     dextromethorphan-guaiFENesin (MUCINEX DM) 30-600 MG 12hr tablet Take 1 tablet by mouth 2 (two) times daily as needed for cough.     Evolocumab (REPATHA SURECLICK) 010 MG/ML SOAJ Inject 140 mg into the skin every 14 (fourteen) days. 2 mL 11   losartan (COZAAR) 25 MG tablet Take 1 tablet (25 mg total)  by mouth daily. 90 tablet 3   sildenafil (REVATIO) 20 MG tablet Take 1-5 tablets (20-100 mg total) by mouth as needed. 50 tablet 1   No current facility-administered medications for this visit.    Family History  Problem Relation Age of Onset   Dementia Mother    Colon cancer Mother    Prostate cancer Mother    Heart disease Father    Hyperlipidemia Father    Colon cancer Maternal Grandmother    Pancreatic cancer Sister     Social History   Socioeconomic History   Marital status: Married    Spouse name:  Not on file   Number of children: 1   Years of education: Not on file   Highest education level: Not on file  Occupational History   Occupation: retired  Tobacco Use   Smoking status: Former    Packs/day: 0.25    Years: 30.00    Total pack years: 7.50    Types: Cigarettes    Quit date: 07/21/2003    Years since quitting: 18.2   Smokeless tobacco: Never  Vaping Use   Vaping Use: Never used  Substance and Sexual Activity   Alcohol use: Yes    Alcohol/week: 1.0 standard drink of alcohol    Types: 1 Glasses of wine per week    Comment: occasional wine or beer   Drug use: No    Comment: marijuana use in past several yrs ago   Sexual activity: Yes  Other Topics Concern   Not on file  Social History Narrative   Pt lives in [place] with wife.  Retired from department of [employer].  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  One son.    Social Determinants of Health   Financial Resource Strain: Not on file  Food Insecurity: Not on file  Transportation Needs: Not on file  Physical Activity: Not on file  Stress: Not on file  Social Connections: Not on file  Intimate Partner Violence: Not on file     REVIEW OF SYSTEMS:   '[X]'$  denotes positive finding, '[ ]'$  denotes negative finding Cardiac  Comments:  Chest pain or chest pressure:    Shortness of breath upon exertion:    Short of breath when lying flat:    Irregular heart rhythm:        Vascular     Pain in calf, thigh, or hip brought on by ambulation:    Pain in feet at night that wakes you up from your sleep:     Blood clot in your veins:    Leg swelling:         Pulmonary    Oxygen at home:    Productive cough:     Wheezing:         Neurologic    Sudden weakness in arms or legs:     Sudden numbness in arms or legs:     Sudden onset of difficulty speaking or slurred speech:    Temporary loss of vision in one eye:     Problems with dizziness:         Gastrointestinal    Blood in stool:     Vomited blood:         Genitourinary    Burning when urinating:     Blood in urine:        Psychiatric    Major depression:         Hematologic    Bleeding problems:    Problems with blood clotting too easily:        Skin    Rashes or ulcers:        Constitutional    Fever or chills:      PHYSICAL EXAMINATION:  Today's Vitals   10/25/21 0853  BP: (!) 164/97  Pulse: (!) 42  Resp: 18  Temp: (!) 97.2 F (36.2 C)  TempSrc: Oral  SpO2: 99%  Weight: 160 lb (72.6 kg)  Height: '5\' 10"'$  (1.778 m)   Body mass index is 22.96 kg/m.   General:  WDWN in NAD; vital signs documented above Gait: Not observed  HENT: WNL, normocephalic Pulmonary: normal non-labored breathing  Cardiac: regular HR, without carotid bruits Abdomen: soft, NT; aortic pulse is not palpable Skin: without rashes Vascular Exam/Pulses:  Right Left  Radial 2+ (normal) 2+ (normal)  Femoral 2+ (normal) 2+ (normal)  Popliteal Unable to palpate Unable to palpate  PT 2+ (normal) 2+ (normal)   Extremities: without ischemic changes, without Gangrene , without cellulitis; without open wounds Musculoskeletal: no muscle wasting or atrophy  Neurologic: A&O X 3;  No focal weakness or paresthesias are detected Psychiatric:  The pt has Normal affect.   Non-Invasive Vascular Imaging:   AAA Arterial duplex on 10/25/2021: Abdominal Aorta Findings:   +-------------+-------+----------+----------+----------+--------+--------+  Location     AP (cm)Trans (cm)PSV (cm/s)Waveform  ThrombusComments  +-------------+-------+----------+----------+----------+--------+--------+  Proximal     3.93   3.93      47                                    +-------------+-------+----------+----------+----------+--------+--------+  Mid          4.34   4.55      24                                    +-------------+-------+----------+----------+----------+--------+--------+  Distal       2.51   2.33      34                                    +-------------+-------+----------+----------+----------+--------+--------+  RT CIA Prox  1.8    1.9       73        monophasic                  +-------------+-------+----------+----------+----------+--------+--------+  RT CIA Distal                 95        triphasic         tortuous  +-------------+-------+----------+----------+----------+--------+--------+  LT CIA Prox  1.7    1.7       108       biphasic                    +-------------+-------+----------+----------+----------+--------+--------+  LT CIA Distal88.0                       biphasic          tortuous  +-------------+-------+----------+----------+----------+--------+--------+   Summary:  Abdominal Aorta: There is evidence of abnormal dilatation of the proximal, mid and distal Abdominal aorta. The largest aortic diameter has increased compared to prior exam. Previous diameter measurement was obtained on 03/09/20.   Renal artery duplex 10/25/2021: Right: No evidence of renal artery stenosis. Renal vein flow is present.  Left:  >60% proximal renal artery stenosis. Renal vein flow is present. No significant change since previous study.   Previous AAA and renal artery arterial duplex on 03/09/2021: Abdominal Aorta Findings:  +-----------+-------+----------+----------+--------+--------+--------+   Location  AP (cm)Trans (cm)PSV (cm/s)WaveformThrombusComments  +-----------+-------+----------+----------+--------+--------+--------+  Proximal  4.10   3.90      45                                  +-----------+-------+----------+----------+--------+--------+--------+  Mid       3.60   4.00      31                                  +-----------+-------+----------+----------+--------+--------+--------+  Distal    3.50   3.70      29                                  +-----------+-------+----------+----------+--------+--------+--------+  RT CIA Prox1.5    1.6       103                                 +-----------+-------+----------+----------+--------+--------+--------+  LT CIA Prox1.6    1.5       108                                 +-----------+-------+----------+----------+--------+--------+--------+   Right: Normal size right kidney. No evidence of right renal artery stenosis. RRV flow present. Second renal artery not identified.  Left:  Normal size of left kidney. Evidence of a > 60% stenosis in the left renal artery. LRV flow present    ASSESSMENT/PLAN:: 76 y.o. male here for follow up for AAA & RAS  -pt doing well since last visit without new abdominal or back pain.   -his AAA has increased in size from 4.0 to 4.5cm since February 2022.  Will bring him back in 6 months to make sure it is not rapidly enlarging and if it is stable, will continue with one year follow up.  Also when he returns, we will get BLE duplex of his popliteal arteries to  evaluate for aneurysm given his AAA.   -his BP is elevated today.  He tells me he was recently started on Losartan.  I would suspect it does not run this high at home.  I have asked him to record his BP in the morning and evening and take to his PCP or cardiologist for management of his BP for better control.  He expressed understanding.   -will check renal artery duplex in one year. -discussed with pt  that if he develops sudden severe onset of abdominal or back pain, he is to call 911.  -discussed with him that his children should be evaluated at some point for AAA given it can have a familial aspect. -continue statin/Eliquis -he would prefer a later appt at his next visit.   Leontine Locket, [facility] [phone]  Clinic MD:   Virl Cagey

## 2021-10-18 ENCOUNTER — Other Ambulatory Visit: Payer: Self-pay | Admitting: *Deleted

## 2021-10-18 ENCOUNTER — Ambulatory Visit (HOSPITAL_COMMUNITY): Payer: Medicare PPO | Attending: Internal Medicine

## 2021-10-18 DIAGNOSIS — R072 Precordial pain: Secondary | ICD-10-CM | POA: Diagnosis not present

## 2021-10-18 DIAGNOSIS — R079 Chest pain, unspecified: Secondary | ICD-10-CM | POA: Diagnosis not present

## 2021-10-18 DIAGNOSIS — I25119 Atherosclerotic heart disease of native coronary artery with unspecified angina pectoris: Secondary | ICD-10-CM | POA: Diagnosis not present

## 2021-10-18 DIAGNOSIS — I701 Atherosclerosis of renal artery: Secondary | ICD-10-CM

## 2021-10-18 DIAGNOSIS — I714 Abdominal aortic aneurysm, without rupture, unspecified: Secondary | ICD-10-CM

## 2021-10-18 LAB — MYOCARDIAL PERFUSION IMAGING
LV dias vol: 88 mL (ref 62–150)
LV sys vol: 37 mL
Nuc Stress EF: 58 %
Peak HR: 77 {beats}/min
Rest HR: 55 {beats}/min
Rest Nuclear Isotope Dose: 10.7 mCi
SDS: 1
SRS: 0
SSS: 1
ST Depression (mm): 0 mm
Stress Nuclear Isotope Dose: 30.5 mCi
TID: 0.94

## 2021-10-18 MED ORDER — REGADENOSON 0.4 MG/5ML IV SOLN
0.4000 mg | Freq: Once | INTRAVENOUS | Status: AC
  Administered 2021-10-18: 0.4 mg via INTRAVENOUS

## 2021-10-18 MED ORDER — TECHNETIUM TC 99M TETROFOSMIN IV KIT
10.7000 | PACK | Freq: Once | INTRAVENOUS | Status: AC | PRN
  Administered 2021-10-18: 10.7 via INTRAVENOUS

## 2021-10-18 MED ORDER — TECHNETIUM TC 99M TETROFOSMIN IV KIT
30.5000 | PACK | Freq: Once | INTRAVENOUS | Status: AC | PRN
  Administered 2021-10-18: 30.5 via INTRAVENOUS

## 2021-10-25 ENCOUNTER — Ambulatory Visit (INDEPENDENT_AMBULATORY_CARE_PROVIDER_SITE_OTHER)
Admission: RE | Admit: 2021-10-25 | Discharge: 2021-10-25 | Disposition: A | Discharge status: Home / Self Care | Payer: Medicare PPO | Source: Ambulatory Visit | Attending: Vascular Surgery | Admitting: Vascular Surgery

## 2021-10-25 ENCOUNTER — Ambulatory Visit: Payer: Medicare PPO

## 2021-10-25 ENCOUNTER — Ambulatory Visit (HOSPITAL_COMMUNITY)
Admission: RE | Admit: 2021-10-25 | Discharge: 2021-10-25 | Disposition: A | Discharge status: Home / Self Care | Payer: Medicare PPO | Source: Ambulatory Visit | Attending: Vascular Surgery | Admitting: Vascular Surgery

## 2021-10-25 ENCOUNTER — Ambulatory Visit: Payer: Medicare PPO | Admitting: Physician Assistant

## 2021-10-25 VITALS — BP 164/97 | HR 42 | Temp 97.2°F | Resp 18 | Ht 70.0 in | Wt 160.0 lb

## 2021-10-25 DIAGNOSIS — I701 Atherosclerosis of renal artery: Secondary | ICD-10-CM | POA: Insufficient documentation

## 2021-10-25 DIAGNOSIS — I714 Abdominal aortic aneurysm, without rupture, unspecified: Secondary | ICD-10-CM

## 2021-10-29 DIAGNOSIS — N400 Enlarged prostate without lower urinary tract symptoms: Secondary | ICD-10-CM | POA: Diagnosis not present

## 2021-10-29 DIAGNOSIS — R3915 Urgency of urination: Secondary | ICD-10-CM | POA: Diagnosis not present

## 2021-10-31 ENCOUNTER — Other Ambulatory Visit: Payer: Self-pay

## 2021-10-31 DIAGNOSIS — I739 Peripheral vascular disease, unspecified: Secondary | ICD-10-CM

## 2021-10-31 DIAGNOSIS — I714 Abdominal aortic aneurysm, without rupture, unspecified: Secondary | ICD-10-CM

## 2021-11-01 ENCOUNTER — Ambulatory Visit: Payer: Medicare PPO | Admitting: Gastroenterology

## 2021-11-01 ENCOUNTER — Encounter: Payer: Self-pay | Admitting: Gastroenterology

## 2021-11-01 ENCOUNTER — Other Ambulatory Visit: Payer: Self-pay | Admitting: Cardiology

## 2021-11-01 ENCOUNTER — Other Ambulatory Visit: Payer: Self-pay | Admitting: Family Medicine

## 2021-11-01 ENCOUNTER — Telehealth: Payer: Self-pay

## 2021-11-01 VITALS — BP 130/82 | HR 65 | Ht 70.0 in | Wt 160.0 lb

## 2021-11-01 DIAGNOSIS — Z7901 Long term (current) use of anticoagulants: Secondary | ICD-10-CM

## 2021-11-01 DIAGNOSIS — Z86010 Personal history of colonic polyps: Secondary | ICD-10-CM

## 2021-11-01 DIAGNOSIS — K311 Adult hypertrophic pyloric stenosis: Secondary | ICD-10-CM | POA: Diagnosis not present

## 2021-11-01 MED ORDER — SUTAB 1479-225-188 MG PO TABS: 1.0000 | ORAL_TABLET | Freq: Once | ORAL | 0 refills | Status: AC

## 2021-11-01 NOTE — Patient Instructions (Signed)
_______________________________________________________  If you are age 76 or older, your body mass index should be between 23-30. Your Body mass index is 22.96 kg/m. If this is out of the aforementioned range listed, please consider follow up with your Primary Care Provider.  If you are age 48 or younger, your body mass index should be between 19-25. Your Body mass index is 22.96 kg/m. If this is out of the aformentioned range listed, please consider follow up with your Primary Care Provider.   ________________________________________________________  The [place] [facility] providers would like to encourage you to use [facility] to communicate with providers for non-urgent requests or questions.  Due to long hold times on the telephone, sending your provider a message by [facility] may be a faster and more efficient way to get a response.  Please allow 48 business hours for a response.  Please remember that this is for non-urgent requests.  _______________________________________________________  Dennis Bast have been scheduled for a colonoscopy. Please follow written instructions given to you at your visit today.  Please pick up your prep supplies at the pharmacy within the next 1-3 days. If you use inhalers (even only as needed), please bring them with you on the day of your procedure.  You will be contacted by our office prior to your procedure for directions on holding your ELIQUIS.  If you do not hear from our office 1 week prior to your scheduled procedure, please call [phone] to discuss.   Thank you for entrusting me with your care and for choosing [facility], Dr. Somerset Cellar

## 2021-11-01 NOTE — Telephone Encounter (Signed)
I forgot to include Dr. Havery Moros would like  clearance for patient to hold Eliquis for 2 days prior to procedure scheduled for 10-31. I apologize for the omission.  Thank you.

## 2021-11-01 NOTE — Progress Notes (Signed)
HPI :  76 year old male with a history of pyloric stricture, history of colon polyps, history of A-fib on Eliquis, CAD, here to reestablish care and discuss surveillance colonoscopy.  His last colonoscopy with me was in June 2018, he had 5 polyps removed at that time, 1 of which was sessile serrated 8 mm, another adenoma, the rest hyperplastic.  I had recommended a repeat colonoscopy in 5 years given this result and that his mother and maternal grandmother both had colon cancer.  He denies any significant problems with his bowels.  He had prostate cancer with radioactive seed placement 3 to 4 years ago, states his bowels can sometimes be irregular with consistency and frequency but nothing significant that bothers him.  He denies any blood in his stools or urgency or incontinence.  He is taking Eliquis for A-fib.  He denies any cardiopulmonary symptoms.  Recently had a nuclear stress test that looked okay.  Recall the last time I saw him he was having reflux with belching and intermittent nausea and vomiting.  He had an EGD with me at the same time as his last colonoscopy and he was found to have a pyloric stricture and could not traverse it with a regular endoscope.  He required to follow-up EGDs at the hospital, to balloon dilate the stricture to 15 mm and then 18 mm, the last on August 2018.  He had an excellent result to the dilation.  He states symptoms have not recurred and he continues to eat well.  Denies any reflux symptoms.  Denies any postprandial pain.  Denies early satiety.  Denies any nausea or vomiting.  He is doing well in this regard.  He otherwise denies complaints today.  Denies any cardiopulmonary symptoms.   Endoscopic history: Colonoscopy 06/26/2016: The perianal and digital rectal examinations were normal. - A 3 mm polyp was found in the cecum. The polyp was sessile. The polyp was removed with a cold snare. Resection and retrieval were complete. - A 8 mm polyp was found in  the ascending colon. The polyp was flat. The polyp was removed with a cold snare. Resection and retrieval were complete. - A 5 mm polyp was found in the transverse colon. The polyp was sessile. The polyp was removed with a cold snare. Resection and retrieval were complete. - A 5 mm polyp was found in the sigmoid colon. The polyp was sessile. The polyp was removed with a cold snare. Resection and retrieval were complete. - A 5 mm polyp was found in the rectum. The polyp was sessile. The polyp was removed with a cold snare. Resection and retrieval were complete. - A few medium-mouthed diverticula were found in the ascending colon and cecum. - Anal papilla(e) were hypertrophied. - The colon was extremely spastic, which prolonged this procedure. The exam was otherwise   EGD 06/26/2016: -- LA Grade A (one or more mucosal breaks less than 5 mm, not extending between tops of 2 mucosal folds) esophagitis was found in the distal esophagus. - The exam of the esophagus was otherwise normal. No stenosis / stricture appreciated. - A benign-appearing, intrinsic stenosis was found at the pylorus. The lumen was a few mm wide and the endoscope could not traverse it. I could also not view the lumen distal to it, in this light empiric dilation was not performed. Biopsies were taken with a cold forceps, placed through the stricture to biopsy the bulb and the stricture itself for histology. - The exam of the stomach was  otherwise normal. There was no retained food in the stomach.  1. Duodenum, Biopsy, near pyloric area 41 cm - DUODENAL AND PYLORIC-TYPE GASTRIC MUCOSA WITH MILD REACTIVE CHANGES. - NO EVIDENCE OF DYSPLASIA OR MALIGNANCY. 2. Colon, polyp(s), cecum, ascending, transverse, sigmoid, rectal, polyps (5) - TUBULAR ADENOMA (ONE FRAGMENT). - SESSILE SERRATED POLYP (ONE FRAGMENT). - HYPERPLASTIC POLYPS (THREE FRAGMENTS). - NO HIGH GRADE DYSPLASIA OR MALIGNANCY.   EGD 07/31/2016: - Esophagogastric  landmarks identified. - 2 cm hiatal hernia. - Normal esophagus - A medium amount of food (residue) in the stomach due to partial gastric outlet obstruction. - Severe gastric stenosis was found at the pylorus. Dilated to 14m with good response. Biopsied. - Normal duodenal bulb and second portion of the duodenum.   EGD 08/27/2016: Esophagogastric landmarks identified. - 2 cm hiatal hernia. - Gastric stenosis was found at the pylorus. Dilated to 112mwith good response. - Normal duodenal bulb and second portion of the duodenum. Overall, patient has had a good functional response to dilation - no further retained food in the stomach and no further symptoms. While stenosis remained present, not causing symptoms. Dilated to [id number] with good result.   Nuclear stress test 10/18/21: Findings are consistent with prior myocardial infarction. The study is intermediate risk.   No ST deviation was noted.   Left ventricular function is normal. End diastolic cavity size is normal.   Prior study available for comparison from 08/24/2018.    Past Medical History:  Diagnosis Date   AAA (abdominal aortic aneurysm) ([place]CC[place]/08/2017   3.9cm infrarenal   Allergy to ACE inhibitors    Cough and fatigue with ACEI   Aortic atherosclerosis (HCCR[place]8/06/2017   Noted on CT chest   Atrial fibrillation (HCC)    CAD (coronary artery disease) stent x 1 2011 and 1  2014   a. LHC in 2011 for stable angina >> [facility]  b. inf-post STEMI 7/15 c/b CHB with jxn escape >> LHC: OM1 60-70, pLAD 30-40, m-d RCA occluded, EF 50-55 >> PCI: 3 x 33 mm Xience DES to RCA  //  c. LHC 8/17: dLM 20, pLAD 20, pLAD stent ok, pRI 30, pRCA stent ok with 30% ISR   Colon polyps    Diverticulosis    Emphysema lung ([place]CC[place]8/06/2017   Noted on CT chest   Gastric stenosis 08/27/2016   at pylorus, noted on endoscopy   GERD (gastroesophageal reflux disease)    Hydrocele, bilateral 01/30/2017   Noted on CT chest    Hyperlipidemia    Myalgias with Zocor, Crestor, atorvastatin.    Nicotine abuse    [facility] // Xarelto anticoagulation  //  ILR removed 2/17 // Event Monitor 11/17: Predominantly NSR (90%). Paroxysmal atrial fibrillation (10%).  Atrial fibrillation appears to be rate-controlled when it occurs.    Prostate cancer ([facility]CC[place] 2019   Pulmonary nodule, right 08/18/2017   6 mm right upper lobe, 5.5 mm right middle lobe, Noted on CT Chest   Pyloric stenosis    Thoracic ascending aortic aneurysm (HCCRock [place]1/18/2019   3.9 cm     Past Surgical History:  Procedure Laterality Date   astentngioplasty/     ATRIAL FIBRILLATION ABLATION  07/2010   [facility]   [place] 07/31/2016   Procedure: BALLarrie KassLATION;  Surgeon: ArmManus GunningD;  Location:  [facility];  Service: Gastroenterology;  Laterality: N/A;   BALLOON DILATION N/A 08/27/2016   Procedure: BALLOON DILATION;  Surgeon: Manus Gunning, MD;  Location: [facility];  Service: Gastroenterology;  Laterality: N/A;   CARDIAC CATHETERIZATION     CARDIAC CATHETERIZATION N/A 08/17/2015   Procedure: Left Heart Cath and Coronary Angiography;  Surgeon: Larey Dresser, MD;  Location: [place] CV LAB;  Service: Cardiovascular;  Laterality: N/A;   COLONOSCOPY W/ POLYPECTOMY  06/26/2016   CORONARY STENT PLACEMENT     x 2   EP IMPLANTABLE DEVICE N/A 03/09/2015   Procedure: Loop Recorder Removal;  Surgeon: [name], MD;  Location: [place] CV LAB;  Service: Cardiovascular;  Laterality: N/A;   ESOPHAGOGASTRODUODENOSCOPY N/A 07/31/2016   Procedure: ESOPHAGOGASTRODUODENOSCOPY (EGD) also have pediatric scope available;  Surgeon: Manus Gunning, MD;  Location: [facility];  Service: Gastroenterology;  Laterality: N/A;   ESOPHAGOGASTRODUODENOSCOPY N/A 08/27/2016   Procedure:  ESOPHAGOGASTRODUODENOSCOPY (EGD);  Surgeon: Manus Gunning, MD;  [facility];  Service: Gastroenterology;  Laterality: N/A;   implantable loop recorder implantation  08/13/2010   MDT Reveal XT implanted by Dr Roxan Hockey for afib management post ablation   LEFT HEART CATH N/A 07/15/2013   Procedure: LEFT HEART CATH;  Surgeon: Clent Demark, MD;  [facility] C[facility] LAB;  Service: Cardiovascular;  Laterality: N/A;   loop recorder removed  2017   RADIOACTIVE SEED IMPLANT N/A 11/27/2017   Procedure: RADIOACTIVE SEED IMPLANT/BRACHYTHERAPY IMPLANT;  Surgeon: Kathie Rhodes, MD;  Location: [place];  Service: Urology;  Laterality: N/A;   SPACE OAR INSTILLATION N/A 11/27/2017   Procedure: SPACE OAR INSTILLATION;  Surgeon: Kathie Rhodes, MD;  Location: [facility];  Service: Urology;  Laterality: N/A;   TONSILLECTOMY  age 89   Family History  Problem Relation Age of Onset   Dementia Mother    Colon cancer Mother    Prostate cancer Mother    Heart disease Father    Hyperlipidemia Father    Pancreatic cancer Sister    Colon cancer Maternal Grandmother    Esophageal cancer Neg Hx    Stomach cancer Neg Hx    Social History   Tobacco Use   Smoking status: Former    Packs/day: 0.25    Years: 30.00    Total pack years: 7.50    Types: Cigarettes    Quit date: 07/21/2003    Years since quitting: 18.2   Smokeless tobacco: Never  Vaping Use   Vaping Use: Never used  Substance Use Topics   Alcohol use: Yes    Alcohol/week: 1.0 standard drink of alcohol    Types: 1 Glasses of wine per week    Comment: occasional wine or beer   Drug use: No    Comment: marijuana use in past several yrs ago   Current Outpatient Medications  Medication Sig Dispense Refill   acetaminophen (TYLENOL) 325 MG tablet Take 325 mg by mouth every 6 (six) hours as needed (FOR PAIN.).     apixaban (ELIQUIS) 5 MG TABS tablet Take 1 tablet (5 mg total) by mouth 2 (two)  times daily. 180 tablet 3   Cholecalciferol 100 MCG (4000 UT) CAPS Take 4,000 Units by mouth daily.     dextromethorphan-guaiFENesin (MUCINEX DM) 30-600 MG 12hr tablet Take 1 tablet by mouth 2 (two) times daily as needed for cough.     Evolocumab (REPATHA SURECLICK) 657 MG/ML SOAJ Inject 140 mg into the skin every 14 (fourteen) days. 2  mL 11   losartan (COZAAR) 25 MG tablet Take 1 tablet (25 mg total) by mouth daily. 90 tablet 3   sildenafil (REVATIO) 20 MG tablet Take 1-5 tablets (20-100 mg total) by mouth as needed. (Patient not taking: Reported on 11/01/2021) 50 tablet 1   No current facility-administered medications for this visit.   Allergies  Allergen Reactions   Clopidogrel Other (See Comments)    unknown reaction   Crestor [Rosuvastatin Calcium] Other (See Comments)    Extreme muscular weakness   Lipitor [Atorvastatin Calcium] Other (See Comments)    Extreme muscular weakness    Statins Other (See Comments)    Extreme muscular weakness      Review of Systems: All systems reviewed and negative except where noted in HPI.    Lab Results  Component Value Date   WBC 7.9 07/03/2021   HGB 14.7 07/03/2021   HCT 43.8 07/03/2021   MCV 93 07/03/2021   PLT 220 07/03/2021    Lab Results  Component Value Date   CREATININE 0.95 07/03/2021   BUN 18 07/03/2021   NA 141 07/03/2021   K 5.1 07/03/2021   CL 104 07/03/2021   CO2 23 07/03/2021    Lab Results  Component Value Date   ALT 11 07/03/2021   AST 16 07/03/2021   ALKPHOS 98 07/03/2021   BILITOT 0.4 07/03/2021     Physical Exam: BP 130/82   Pulse 65   Ht '5\' 10"'$  (1.778 m)   Wt 160 lb (72.6 kg)   BMI 22.96 kg/m  Constitutional: Pleasant,well-developed, male in no acute distress. HEENT: Normocephalic and atraumatic. Conjunctivae are normal. No scleral icterus. Neck supple.  Cardiovascular: Normal rate, regular rhythm.  Pulmonary/chest: Effort normal and breath sounds normal.  Abdominal: Soft, nondistended,  nontender.  There are no masses palpable.  Extremities: no edema Lymphadenopathy: No cervical adenopathy noted. Neurological: Alert and oriented to person place and time. Skin: Skin is warm and dry. No rashes noted. Psychiatric: Normal mood and affect. Behavior is normal.   ASSESSMENT: 76 y.o. male here for assessment of the following  1. History of colon polyps   2. Anticoagulated   3. Pyloric stricture    We reviewed the results of his last colonoscopy and discussed at his age with his medical problems if he wanted to have any further colonoscopies or not.  He has no cardiopulmonary symptoms, recent nuclear stress test looked okay, he is feeling well and I think could tolerate a prep and the colonoscopy well.  Given his strong family history of colon cancer and history of polyps I think 1 more exam prior to stopping is reasonable, assuming no high risk pathology.  Following full discussion of this including risks and benefits, he wants to proceed.  We will reach out to his cardiologist to request holding of Eliquis for 2 days prior to the exam.  Further recommendations pending the results.  Otherwise, history of significant pyloric stricture status post a few balloon dilations to open this up and relieve his symptoms.  He is done remarkably well following therapy over the past several years and has not needed retreatment.  He can follow-up with me as needed if symptoms recur, no plans for EGD at this point time given he feels well.  PLAN: - colonoscopy at the [facility] - request approval Dr. Percival Spanish to hold Eliquis for 2 days - no upper tract symptoms, dilation has resolved his prior symptoms, f/u PRN  Jolly Mango, MD [facility]  CC: Dettinger,  Fransisca Kaufmann, MD

## 2021-11-01 NOTE — Telephone Encounter (Signed)
 [facility] Pre-operative Risk Assessment     Request for surgical clearance:     Endoscopy Procedure  What type of surgery is being performed?     COLONOSCOPY  When is this surgery scheduled?     11-12-21  What type of clearance is required ?   Pharmacy  Are there any medications that need to be held prior to surgery and how long? [place] name and name of physician performing surgery?   [facility]  What is your office phone and fax number?      Phone- [phone]  Fax- [phone] ATTN: JAN  Anesthesia type (None, local, MAC, general) ?       MAC   THANK YOU

## 2021-11-04 NOTE — Telephone Encounter (Signed)
Called and spoke to patient.  He needs to hold Eliquis on 10-29 and 10-30, Sunday and Monday before procedure on Tues, 10-31. Patient asked that I send him a MyChart message with the dates.  MyChart message sent.

## 2021-11-04 NOTE — Telephone Encounter (Signed)
     Primary Cardiologist: Minus Breeding, MD  Chart reviewed as part of pre-operative protocol coverage. Given past medical history and time since last visit, based on ACC/AHA guidelines, MALACKI MCPHEARSON would be at acceptable risk for the planned procedure without further cardiovascular testing.   Patient with diagnosis of A Fib on Eliquis for anticoagulation.     Procedure: colonoscopy Date of procedure: 11/12/21     CHA2DS2-VASc Score = 4  This indicates a 4.8% annual risk of stroke. The patient's score is based upon: CHF History: 0 HTN History: 1 Diabetes History: 0 Stroke History: 0 Vascular Disease History: 1 Age Score: 2 Gender Score: 0   CrCl 60 mL/min Platelet count 220K     Per office protocol, patient can hold Eliquis for 2 days prior to procedure.   I will route this recommendation to the requesting party via Epic fax function and remove from pre-op pool.  Please call with questions.  Jossie Ng. Laqueisha Catalina NP-C     11/04/2021, 9:56 AM [employer] [place] [address] Office [phone] Fax [phone]

## 2021-11-04 NOTE — Telephone Encounter (Signed)
Patient with diagnosis of A Fib on Eliquis for anticoagulation.    Procedure: colonoscopy Date of procedure: 11/12/21   CHA2DS2-VASc Score = 4  This indicates a 4.8% annual risk of stroke. The patient's score is based upon: CHF History: 0 HTN History: 1 Diabetes History: 0 Stroke History: 0 Vascular Disease History: 1 Age Score: 2 Gender Score: 0  CrCl 60 mL/min Platelet count 220K   Per office protocol, patient can hold Eliquis for 2 days prior to procedure.     **This guidance is not considered finalized until pre-operative APP has relayed final recommendations.**

## 2021-11-12 ENCOUNTER — Ambulatory Visit (AMBULATORY_SURGERY_CENTER): Payer: Medicare PPO | Admitting: Gastroenterology

## 2021-11-12 ENCOUNTER — Encounter: Payer: Self-pay | Admitting: Gastroenterology

## 2021-11-12 ENCOUNTER — Encounter: Payer: Medicare PPO | Admitting: Gastroenterology

## 2021-11-12 VITALS — BP 125/80 | HR 57 | Temp 95.5°F | Resp 14 | Ht 70.0 in | Wt 160.0 lb

## 2021-11-12 DIAGNOSIS — K635 Polyp of colon: Secondary | ICD-10-CM | POA: Diagnosis not present

## 2021-11-12 DIAGNOSIS — Z86010 Personal history of colonic polyps: Secondary | ICD-10-CM

## 2021-11-12 DIAGNOSIS — Z09 Encounter for follow-up examination after completed treatment for conditions other than malignant neoplasm: Secondary | ICD-10-CM

## 2021-11-12 DIAGNOSIS — K6289 Other specified diseases of anus and rectum: Secondary | ICD-10-CM

## 2021-11-12 DIAGNOSIS — D123 Benign neoplasm of transverse colon: Secondary | ICD-10-CM | POA: Diagnosis not present

## 2021-11-12 MED ORDER — SODIUM CHLORIDE 0.9 % IV SOLN: 500.0000 mL | Freq: Once | INTRAVENOUS | Status: DC

## 2021-11-12 NOTE — Progress Notes (Signed)
Pt's states no medical or surgical changes since previsit or office visit. 

## 2021-11-12 NOTE — Patient Instructions (Signed)
Information on polyps and diverticulosis given to you today.  Resume Eliquis tomorrow.  Resume all other medications and regular diet.  Await pathology results.   YOU HAD AN ENDOSCOPIC PROCEDURE TODAY AT [facility]:   Refer to the procedure report that was given to you for any specific questions about what was found during the examination.  If the procedure report does not answer your questions, please call your gastroenterologist to clarify.  If you requested that your care partner not be given the details of your procedure findings, then the procedure report has been included in a sealed envelope for you to review at your convenience later.  YOU SHOULD EXPECT: Some feelings of bloating in the abdomen. Passage of more gas than usual.  Walking can help get rid of the air that was put into your GI tract during the procedure and reduce the bloating. If you had a lower endoscopy (such as a colonoscopy or flexible sigmoidoscopy) you may notice spotting of blood in your stool or on the toilet paper. If you underwent a bowel prep for your procedure, you may not have a normal bowel movement for a few days.  Please Note:  You might notice some irritation and congestion in your nose or some drainage.  This is from the oxygen used during your procedure.  There is no need for concern and it should clear up in a day or so.  SYMPTOMS TO REPORT IMMEDIATELY:  Following lower endoscopy (colonoscopy or flexible sigmoidoscopy):  Excessive amounts of blood in the stool  Significant tenderness or worsening of abdominal pains  Swelling of the abdomen that is new, acute  Fever of 100F or higher  For urgent or emergent issues, a gastroenterologist can be reached at any hour by calling [phone]. Do not use MyChart messaging for urgent concerns.    DIET:  We do recommend a small meal at first, but then you may proceed to your regular diet.  Drink plenty of fluids but you should avoid  alcoholic beverages for 24 hours.  ACTIVITY:  You should plan to take it easy for the rest of today and you should NOT DRIVE or use heavy machinery until tomorrow (because of the sedation medicines used during the test).    FOLLOW UP: Our staff will call the number listed on your records the next business day following your procedure.  We will call around 7:15- 8:00 am to check on you and address any questions or concerns that you may have regarding the information given to you following your procedure. If we do not reach you, we will leave a message.     If any biopsies were taken you will be contacted by phone or by letter within the next 1-3 weeks.  Please call us at [phone] if you have not heard about the biopsies in 3 weeks.    SIGNATURES/CONFIDENTIALITY: You and/or your care partner have signed paperwork which will be entered into your electronic medical record.  These signatures attest to the fact that that the information above on your After Visit Summary has been reviewed and is understood.  Full responsibility of the confidentiality of this discharge information lies with you and/or your care-partner.

## 2021-11-12 NOTE — Op Note (Signed)
[place] Patient Name: Dustin Bauer Procedure Date: 11/12/2021 1:35 PM MRN: [id number] Endoscopist: Remo Lipps P. Havery Moros , MD, [id number] Age: 76 Referring MD:  Date of Birth: 09/06/1945 Gender: Male Account #: [id number] Procedure:                Colonoscopy Indications:              High risk colon cancer surveillance: Personal                            history of colonic polyps - SSP and TA removed                            06/2016 Medicines:                Monitored Anesthesia Care Procedure:                Pre-Anesthesia Assessment:                           - Prior to the procedure, a History and Physical                            was performed, and patient medications and                            allergies were reviewed. The patient's tolerance of                            previous anesthesia was also reviewed. The risks                            and benefits of the procedure and the sedation                            options and risks were discussed with the patient.                            All questions were answered, and informed consent                            was obtained. Prior Anticoagulants: The patient has                            taken Eliquis (apixaban), last dose was 2 days                            prior to procedure. ASA Grade Assessment: III - A                            patient with severe systemic disease. After                            reviewing the risks and benefits, the patient was  deemed in satisfactory condition to undergo the                            procedure.                           After obtaining informed consent, the colonoscope                            was passed under direct vision. Throughout the                            procedure, the patient's blood pressure, pulse, and                            oxygen saturations were monitored continuously. The                             PCF-HQ190L Colonoscope was introduced through the                            anus and advanced to the the cecum, identified by                            appendiceal orifice and ileocecal valve. The                            colonoscopy was performed without difficulty. The                            patient tolerated the procedure well. The quality                            of the bowel preparation was good. The ileocecal                            valve, appendiceal orifice, and rectum were                            photographed. Scope In: 1:39:11 PM Scope Out: 2:02:40 PM Scope Withdrawal Time: 0 hours 19 minutes 24 seconds  Total Procedure Duration: 0 hours 23 minutes 29 seconds  Findings:                 The perianal and digital rectal examinations were                            normal.                           A few small-mouthed diverticula were found in the                            ascending colon.  A 5 mm polyp was found in the transverse colon. The                            polyp was flat. The polyp was removed with a cold                            snare. Resection and retrieval were complete.                           Anal papilla(e) were hypertrophied. Biopsies were                            taken with a cold forceps for histology to rule out                            AIN.                           Internal hemorrhoids were found during retroflexion.                           The exam was otherwise without abnormality. Complications:            No immediate complications. Estimated blood loss:                            Minimal. Estimated Blood Loss:     Estimated blood loss was minimal. Impression:               - Diverticulosis in the ascending colon.                           - One 5 mm polyp in the transverse colon, removed                            with a cold snare. Resected and retrieved.                           - Anal papilla(e)  were hypertrophied. Biopsied to                            rule out AIN.                           - Internal hemorrhoids.                           - The examination was otherwise normal. Recommendation:           - Patient has a contact number available for                            emergencies. The signs and symptoms of potential                            delayed complications were discussed  with the                            patient. Return to normal activities tomorrow.                            Written discharge instructions were provided to the                            patient.                           - Resume previous diet.                           - Continue present medications.                           - Resume Eliquis tomorrow                           - Await pathology results. Likely no further                            surveillance is needed given no high risk lesions                            and patient's age Dustin Bauer. Reagyn Facemire, MD 11/12/2021 2:08:49 PM This report has been signed electronically.

## 2021-11-12 NOTE — Progress Notes (Signed)
Sedate, gd SR, tolerated procedure well, VSS, report to RN 

## 2021-11-12 NOTE — Progress Notes (Signed)
History and Physical Interval Note: Patient seen on 11/01/21 - no interval changes. Here for colonoscopy for history of colon polyps, has been off Eliquis for 2 days. Have discussed risks / benefits and he wishes to proceed.  11/12/2021 1:29 PM  Dustin Bauer  has presented today for endoscopic procedure(s), with the diagnosis of  Encounter Diagnosis  Name Primary?   History of colon polyps Yes  .  The various methods of evaluation and treatment have been discussed with the patient and/or family. After consideration of risks, benefits and other options for treatment, the patient has consented to  the endoscopic procedure(s).   The patient's history has been reviewed, patient examined, no change in status, stable for surgery.  I have reviewed the patient's chart and labs.  Questions were answered to the patient's satisfaction.    Jolly Mango, MD [facility]

## 2021-11-12 NOTE — Progress Notes (Signed)
Called to room to assist during endoscopic procedure.  Patient ID and intended procedure confirmed with present staff. Received instructions for my participation in the procedure from the performing physician.  

## 2021-11-13 ENCOUNTER — Telehealth: Payer: Self-pay

## 2021-11-13 NOTE — Telephone Encounter (Signed)
  Follow up Call-     11/12/2021   12:51 PM  Call back number  Post procedure Call Back phone  # [phone]-wifes cell  Permission to leave phone message Yes     Patient questions:  Do you have a fever, pain , or abdominal swelling? No. Pain Score  0 *  Have you tolerated food without any problems? Yes.    Have you been able to return to your normal activities? Yes.    Do you have any questions about your discharge instructions: Diet   No. Medications  No. Follow up visit  No.  Do you have questions or concerns about your Care? No.  Actions: * If pain score is 4 or above: No action needed, pain <4.

## 2021-12-10 DIAGNOSIS — D2261 Melanocytic nevi of right upper limb, including shoulder: Secondary | ICD-10-CM | POA: Diagnosis not present

## 2021-12-10 DIAGNOSIS — L570 Actinic keratosis: Secondary | ICD-10-CM | POA: Diagnosis not present

## 2021-12-10 DIAGNOSIS — L814 Other melanin hyperpigmentation: Secondary | ICD-10-CM | POA: Diagnosis not present

## 2021-12-10 DIAGNOSIS — L821 Other seborrheic keratosis: Secondary | ICD-10-CM | POA: Diagnosis not present

## 2021-12-10 DIAGNOSIS — L579 Skin changes due to chronic exposure to nonionizing radiation, unspecified: Secondary | ICD-10-CM | POA: Diagnosis not present

## 2021-12-10 DIAGNOSIS — Z85828 Personal history of other malignant neoplasm of skin: Secondary | ICD-10-CM | POA: Diagnosis not present

## 2022-01-03 ENCOUNTER — Encounter: Payer: Self-pay | Admitting: Family Medicine

## 2022-01-03 ENCOUNTER — Ambulatory Visit: Payer: Medicare PPO | Admitting: Family Medicine

## 2022-01-03 VITALS — BP 156/94 | HR 44 | Temp 97.1°F | Ht 70.0 in | Wt 159.0 lb

## 2022-01-03 DIAGNOSIS — I480 Paroxysmal atrial fibrillation: Secondary | ICD-10-CM

## 2022-01-03 DIAGNOSIS — Z789 Other specified health status: Secondary | ICD-10-CM | POA: Diagnosis not present

## 2022-01-03 DIAGNOSIS — I25119 Atherosclerotic heart disease of native coronary artery with unspecified angina pectoris: Secondary | ICD-10-CM | POA: Diagnosis not present

## 2022-01-03 DIAGNOSIS — I119 Hypertensive heart disease without heart failure: Secondary | ICD-10-CM | POA: Diagnosis not present

## 2022-01-03 DIAGNOSIS — E7800 Pure hypercholesterolemia, unspecified: Secondary | ICD-10-CM

## 2022-01-03 MED ORDER — LOSARTAN POTASSIUM 50 MG PO TABS: 50.0000 mg | ORAL_TABLET | Freq: Every day | ORAL | 3 refills | Status: DC

## 2022-01-03 MED ORDER — APIXABAN 5 MG PO TABS: 5.0000 mg | ORAL_TABLET | Freq: Two times a day (BID) | ORAL | 3 refills | Status: DC

## 2022-01-03 NOTE — Progress Notes (Signed)
BP (!) 156/94 Comment: Patients BP machine  Pulse (!) 44   Temp (!) 97.1 F (36.2 C) (Temporal)   Ht _0  (1.778 m)   Wt 159 lb (72.1 kg)   SpO2 98%   BMI 22.81 kg/m    Subjective:   Patient ID: Dustin Bauer, male    DOB: May 02, 1945, 76 y.o.   MRN: [id number]  HPI: Dustin Bauer is a 76 y.o. male presenting on 01/03/2022 for Hyperlipidemia and Hypertension (6 month follow up)   HPI Hypertension Patient is currently on losartan, and their blood pressure today is 156/94 and 150/83. Patient denies any lightheadedness or dizziness. Patient denies headaches, blurred vision, chest pains, shortness of breath, or weakness. Denies any side effects from medication and is content with current medication.   Hyperlipidemia Patient is coming in for recheck of his hyperlipidemia. The patient is currently taking Repatha, sees cardiology. They deny any issues with myalgias or history of liver damage from it. They deny any focal numbness or weakness or chest pain.   A-fib Patient has A-fib and is rate controlled and has been stable.  Dustin Bauer is currently taking Eliquis.  Relevant past medical, surgical, family and social history reviewed and updated as indicated. Interim medical history since our last visit reviewed. Allergies and medications reviewed and updated.  Review of Systems  Constitutional:  Negative for chills and fever.  Eyes:  Negative for visual disturbance.  Respiratory:  Negative for shortness of breath and wheezing.   Cardiovascular:  Negative for chest pain and leg swelling.  Musculoskeletal:  Negative for back pain and gait problem.  Skin:  Negative for rash.  Neurological:  Negative for dizziness, weakness and light-headedness.  All other systems reviewed and are negative.   Per HPI unless specifically indicated above   Allergies as of 01/03/2022       Reactions   Clopidogrel Other (See Comments)   unknown reaction   Crestor [rosuvastatin Calcium] Other (See  Comments)   Extreme muscular weakness   Lipitor [atorvastatin Calcium] Other (See Comments)   Extreme muscular weakness   Statins Other (See Comments)   Extreme muscular weakness        Medication List        Accurate as of January 03, 2022  1:15 PM. If you have any questions, ask your nurse or doctor.          acetaminophen 325 MG tablet Commonly known as: TYLENOL Take 325 mg by mouth every 6 (six) hours as needed (FOR PAIN.).   apixaban 5 MG Tabs tablet Commonly known as: Eliquis Take 1 tablet (5 mg total) by mouth 2 (two) times daily.   Cholecalciferol 100 MCG (4000 UT) Caps Take 4,000 Units by mouth daily.   dextromethorphan-guaiFENesin 30-600 MG 12hr tablet Commonly known as: MUCINEX DM Take 1 tablet by mouth 2 (two) times daily as needed for cough.   losartan 50 MG tablet Commonly known as: COZAAR Take 1 tablet (50 mg total) by mouth daily. What changed:  medication strength how much to take Changed by: Worthy Rancher, MD   Repatha SureClick 440 MG/ML Soaj Generic drug: Evolocumab Inject 140 mg into the skin every 14 (fourteen) days.   sildenafil 20 MG tablet Commonly known as: REVATIO Take 1-5 tablets (20-100 mg total) by mouth as needed.         Objective:   BP (!) 156/94 Comment: Patients BP machine  Pulse (!) 44   Temp (!) 97.1 F (36.2 C) (  Temporal)   Ht _0  (1.778 m)   Wt 159 lb (72.1 kg)   SpO2 98%   BMI 22.81 kg/m   Wt Readings from Last 3 Encounters:  01/03/22 159 lb (72.1 kg)  11/12/21 160 lb (72.6 kg)  11/01/21 160 lb (72.6 kg)    Physical Exam Vitals and nursing note reviewed.  Constitutional:      General: Dustin Bauer is not in acute distress.    Appearance: Dustin Bauer is well-developed. Dustin Bauer is not diaphoretic.  Eyes:     General: No scleral icterus.    Conjunctiva/sclera: Conjunctivae normal.  Neck:     Thyroid: No thyromegaly.  Cardiovascular:     Rate and Rhythm: Normal rate and regular rhythm.     Heart sounds: Normal  heart sounds. No murmur heard. Pulmonary:     Effort: Pulmonary effort is normal. No respiratory distress.     Breath sounds: Normal breath sounds. No wheezing.  Musculoskeletal:        General: No swelling. Normal range of motion.     Cervical back: Neck supple.  Lymphadenopathy:     Cervical: No cervical adenopathy.  Skin:    General: Skin is warm and dry.     Findings: No rash.  Neurological:     Mental Status: Dustin Bauer is alert and oriented to person, place, and time.     Coordination: Coordination normal.  Psychiatric:        Behavior: Behavior normal.       Assessment & Plan:   Problem List Items Addressed This Visit       Cardiovascular and Mediastinum   Paroxysmal atrial fibrillation (HCC)   Relevant Medications   apixaban (ELIQUIS) 5 MG TABS tablet   losartan (COZAAR) 50 MG tablet   Other Relevant Orders   CBC with Differential/Platelet   Hypertensive cardiovascular disease   Relevant Medications   apixaban (ELIQUIS) 5 MG TABS tablet   losartan (COZAAR) 50 MG tablet   Other Relevant Orders   CMP14+EGFR   CAD (coronary artery disease)   Relevant Medications   apixaban (ELIQUIS) 5 MG TABS tablet   losartan (COZAAR) 50 MG tablet   Other Relevant Orders   CBC with Differential/Platelet   CMP14+EGFR   Lipid panel     Other   Hyperlipidemia - Primary   Relevant Medications   apixaban (ELIQUIS) 5 MG TABS tablet   losartan (COZAAR) 50 MG tablet   Other Relevant Orders   CBC with Differential/Platelet   CMP14+EGFR   Lipid panel   Statin intolerance    Increase losartan to 50 mg.  It looks like Dustin Bauer is running up a lot of time at home and here in the office.  Monitor for 2 weeks and then call us back with some numbers Follow up plan: Return in about 6 months (around 07/05/2022), or if symptoms worsen or fail to improve, for Hypertension and cholesterol recheck.  Counseling provided for all of the vaccine components Orders Placed This Encounter  Procedures    CBC with Differential/Platelet   CMP14+EGFR   Lipid panel    Caryl Pina, MD [facility] 01/03/2022, 1:15 PM

## 2022-01-04 LAB — LIPID PANEL
Chol/HDL Ratio: 1.8 ratio (ref 0.0–5.0)
Cholesterol, Total: 112 mg/dL (ref 100–199)
HDL: 61 mg/dL (ref >39)
LDL Chol Calc (NIH): 40 mg/dL (ref 0–99)
Triglycerides: 44 mg/dL (ref 0–149)
VLDL Cholesterol Cal: 11 mg/dL (ref 5–40)

## 2022-01-04 LAB — CBC WITH DIFFERENTIAL/PLATELET
Basophils Absolute: 0.1 10*3/uL (ref 0.0–0.2)
Basos: 1 %
EOS (ABSOLUTE): 0.3 10*3/uL (ref 0.0–0.4)
Eos: 5 %
Hematocrit: 43.3 % (ref 37.5–51.0)
Hemoglobin: 14.4 g/dL (ref 13.0–17.7)
Immature Grans (Abs): 0 10*3/uL (ref 0.0–0.1)
Immature Granulocytes: 0 %
Lymphocytes Absolute: 2.4 10*3/uL (ref 0.7–3.1)
Lymphs: 34 %
MCH: 30.8 pg (ref 26.6–33.0)
MCHC: 33.3 g/dL (ref 31.5–35.7)
MCV: 93 fL (ref 79–97)
Monocytes Absolute: 0.6 10*3/uL (ref 0.1–0.9)
Monocytes: 9 %
Neutrophils Absolute: 3.7 10*3/uL (ref 1.4–7.0)
Neutrophils: 51 %
Platelets: 218 10*3/uL (ref 150–450)
RBC: 4.68 x10E6/uL (ref 4.14–5.80)
RDW: 13.9 % (ref 11.6–15.4)
WBC: 7.1 10*3/uL (ref 3.4–10.8)

## 2022-01-04 LAB — CMP14+EGFR
ALT: 15 IU/L (ref 0–44)
AST: 18 IU/L (ref 0–40)
Albumin/Globulin Ratio: 1.9 (ref 1.2–2.2)
Albumin: 4.3 g/dL (ref 3.8–4.8)
Alkaline Phosphatase: 98 IU/L (ref 44–121)
BUN/Creatinine Ratio: 18 (ref 10–24)
BUN: 19 mg/dL (ref 8–27)
Bilirubin Total: 0.4 mg/dL (ref 0.0–1.2)
CO2: 22 mmol/L (ref 20–29)
Calcium: 9.9 mg/dL (ref 8.6–10.2)
Chloride: 105 mmol/L (ref 96–106)
Creatinine, Ser: 1.05 mg/dL (ref 0.76–1.27)
Globulin, Total: 2.3 g/dL (ref 1.5–4.5)
Glucose: 92 mg/dL (ref 70–99)
Potassium: 5.1 mmol/L (ref 3.5–5.2)
Sodium: 141 mmol/L (ref 134–144)
Total Protein: 6.6 g/dL (ref 6.0–8.5)
eGFR: 74 mL/min/{1.73_m2} (ref >59)

## 2022-01-09 ENCOUNTER — Other Ambulatory Visit: Payer: Self-pay | Admitting: Family Medicine

## 2022-01-09 DIAGNOSIS — N529 Male erectile dysfunction, unspecified: Secondary | ICD-10-CM

## 2022-01-17 ENCOUNTER — Other Ambulatory Visit: Payer: Self-pay | Admitting: Cardiology

## 2022-03-25 ENCOUNTER — Telehealth: Payer: Self-pay | Admitting: Cardiology

## 2022-03-25 NOTE — Telephone Encounter (Signed)
Patient c/o Palpitations:  High priority if patient c/o lightheadedness, shortness of breath, or chest pain  How long have you had palpitations/irregular HR/ Afib? Are you having the symptoms now? 10 years, in Afib right now  Are you currently experiencing lightheadedness, SOB or CP? SOB, sometimes lightheadedness  Do you have a history of afib (atrial fibrillation) or irregular heart rhythm? Afib  Have you checked your BP or HR? (document readings if available): BP has been up   Are you experiencing any other symptoms? No

## 2022-03-25 NOTE — Telephone Encounter (Signed)
Noted  

## 2022-03-25 NOTE — Telephone Encounter (Signed)
Called patient, advised that he has been running in and out of AFIB more recently than normal. He would like to see Dr.Hochrein in [place], however I did not have any upcoming appointment for this. His rate is controlled, running in the 60's- continues with eliquis as prescribed, he states he just notices if he is up moving around he can become lightheaded and at time short of breath. He does not have any symptoms to report at this time. I scheduled patient for opening on Friday with NP at 1:55 PM. Patient aware of appointment.   He is aware of ED precautions and symptoms to look for.   Patient verbalized understanding     Will route to NP/Nurse as FYI.  Thanks!

## 2022-03-26 NOTE — Progress Notes (Signed)
Cardiology Clinic Note   Patient Name: Dustin Bauer Date of Encounter: 03/28/2022  Primary Care Provider:  Dettinger, Fransisca Kaufmann, MD Primary Cardiologist:  Minus Breeding, MD  Patient Profile    ERIQUE Bauer 77 year old male presents the clinic today for follow-up evaluation of his paroxysmal atrial fibrillation and coronary artery disease.  Past Medical History    Past Medical History:  Diagnosis Date   AAA (abdominal aortic aneurysm) ([place]) 01/20/2017   3.9cm infrarenal   Allergy to ACE inhibitors    Cough and fatigue with ACEI   Aortic atherosclerosis ([place]) 08/18/2017   Noted on CT chest   Atrial fibrillation (HCC)    CAD (coronary artery disease) stent x 1 2011 and 1  2014   a. LHC in 2011 for stable angina >> DES to pLAD [facility])  //  b. inf-post STEMI 7/15 c/b CHB with jxn escape >> LHC: OM1 60-70, pLAD 30-40, m-d RCA occluded, EF 50-55 >> PCI: 3 x 33 mm Xience DES to RCA  //  c. LHC 8/17: dLM 20, pLAD 20, pLAD stent ok, pRI 30, pRCA stent ok with 30% ISR   Colon polyps    Diverticulosis    Emphysema lung ([place]) 08/18/2017   Noted on CT chest   Gastric stenosis 08/27/2016   at pylorus, noted on endoscopy   GERD (gastroesophageal reflux disease)    Hydrocele, bilateral 01/30/2017   Noted on CT chest   Hyperlipidemia    Myalgias with Zocor, Crestor, atorvastatin.    Nicotine abuse    PAF (paroxysmal atrial fibrillation) [facility])    s/p ablation by Dr Roxan Hockey at [facility] in 2012 // Xarelto anticoagulation  //  ILR removed 2/17 // Event Monitor 11/17: Predominantly NSR (90%). Paroxysmal atrial fibrillation (10%).  Atrial fibrillation appears to be rate-controlled when it occurs.    Prostate cancer ([place]) dx 2019   Pulmonary nodule, right 08/18/2017   6 mm right upper lobe, 5.5 mm right middle lobe, Noted on CT Chest   Pyloric stenosis    Thoracic ascending aortic aneurysm ([place]) 01/30/2017   3.9 cm   Past Surgical History:  Procedure Laterality Date    astentngioplasty/     ATRIAL FIBRILLATION ABLATION  07/2010   Afib ablation by Dr Roxan Hockey at [facility] in 2012   [place] N/A 07/31/2016   Procedure: Larrie Kass DILATION;  Surgeon: Manus Gunning, MD;  [facility];  Service: Gastroenterology;  Laterality: N/A;   BALLOON DILATION N/A 08/27/2016   Procedure: BALLOON DILATION;  Surgeon: Manus Gunning, MD;  [facility];  Service: Gastroenterology;  Laterality: N/A;   CARDIAC CATHETERIZATION     CARDIAC CATHETERIZATION N/A 08/17/2015   Procedure: Left Heart Cath and Coronary Angiography;  Surgeon: Larey Dresser, MD;  Location: [place] CV LAB;  Service: Cardiovascular;  Laterality: N/A;   COLONOSCOPY W/ POLYPECTOMY  06/26/2016   CORONARY STENT PLACEMENT     x 2   EP IMPLANTABLE DEVICE N/A 03/09/2015   Procedure: Loop Recorder Removal;  Surgeon: [name], MD;  Location: [place] CV LAB;  Service: Cardiovascular;  Laterality: N/A;   ESOPHAGOGASTRODUODENOSCOPY N/A 07/31/2016   Procedure: ESOPHAGOGASTRODUODENOSCOPY (EGD) also have pediatric scope available;  Surgeon: Manus Gunning, MD;  [facility];  Service: Gastroenterology;  Laterality: N/A;   ESOPHAGOGASTRODUODENOSCOPY N/A 08/27/2016   Procedure: ESOPHAGOGASTRODUODENOSCOPY (EGD);  Surgeon: Manus Gunning, MD;  [facility];  Service: Gastroenterology;  Laterality: N/A;   implantable loop recorder implantation  08/13/2010  MDT Reveal XT implanted by Dr Roxan Hockey for afib management post ablation   LEFT HEART CATH N/A 07/15/2013   Procedure: LEFT HEART CATH;  Surgeon: Clent Demark, MD;  Location: [facility] CATH LAB;  Service: Cardiovascular;  Laterality: N/A;   loop recorder removed  2017   RADIOACTIVE SEED IMPLANT N/A 11/27/2017   Procedure: RADIOACTIVE SEED IMPLANT/BRACHYTHERAPY IMPLANT;  Surgeon: Kathie Rhodes, MD;  Location: [place];  Service: Urology;  Laterality: N/A;   SPACE  OAR INSTILLATION N/A 11/27/2017   Procedure: SPACE OAR INSTILLATION;  Surgeon: Kathie Rhodes, MD;  Location: [facility];  Service: Urology;  Laterality: N/A;   TONSILLECTOMY  age 15    Allergies  Allergies  Allergen Reactions   Clopidogrel Other (See Comments)    unknown reaction   Crestor [Rosuvastatin Calcium] Other (See Comments)    Extreme muscular weakness   Lipitor [Atorvastatin Calcium] Other (See Comments)    Extreme muscular weakness    Statins Other (See Comments)    Extreme muscular weakness     History of Present Illness    Dustin Bauer has a PMH of paroxysmal atrial fibrillation, coronary artery disease, HTN, abdominal aortic aneurysm, chronic ischemic heart disease, pyloric stricture, HLD, statin intolerance, acute inferior wall MI, heart block, and chronic anticoagulation.  He is status post ablation which was done at [facility].  He underwent cardiac catheterization with PCI to his LAD in 2011, inferior posterior STEMI in 2015 requiring DES to an occluded RCA 100%.  His EF at that time was [id number] and was complicated by complete heart block which resolved without need of PPM.  He underwent subsequent cardiac catheterization in 2017 which showed patent RCA and LAD stent.  His heart rate was noted to be in the 40s.  He was noted to have a small fixed moderate intensity mid anterior septal perfusion defect 2020.  He was seen in follow-up by Dr. Percival Spanish on 10/09/2021.  During that time he was going to the gym 2 days/week.  He enjoyed hiking with his dogs.  He had been having some lightheadedness and mild discomfort along with shortness of breath with exercise.  He denied new shortness of breath, PND and orthopnea.  He denied palpitations, presyncope and syncope.  He did note low heart rates.  He was asymptomatic.  He contacted the nurse triage line on 03/25/2022 and reported that his heart felt like it was in and out of atrial fibrillation.  He was rate controlled in  the 60s and compliant with his Eliquis.  He presents to the clinic today for follow-up evaluation and states over the last month he feels like he has been going in and out of atrial fibrillation.  He notes that his watch has been indicating his resting heart rate was in the 50s and now it is in the 40s.  He is not on any AV nodal blocking agents.  He underwent ablation at [facility] in 12 years ago.  He notes increased fatigue, lack of endurance and lack of energy.  His EKG today shows atrial flutter with slow ventricular response.  His blood pressure is well-controlled at 128/80.  I will have him continue his current medication regimen, order CBC, BMP, TSH, and refer to EP for further evaluation.  I have reviewed ED precautions.  He reports understanding.  Will plan follow-up after he is seen by EP.  Today he denies chest pain, shortness of breath, lower extremity edema, fatigue, palpitations, melena, hematuria, hemoptysis,  diaphoresis, weakness, presyncope, syncope, orthopnea, and PND.   Home Medications    Prior to Admission medications   Medication Sig Start Date End Date Taking? Authorizing Provider  acetaminophen (TYLENOL) 325 MG tablet Take 325 mg by mouth every 6 (six) hours as needed (FOR PAIN.).    [provider]  apixaban (ELIQUIS) 5 MG TABS tablet Take 1 tablet (5 mg total) by mouth 2 (two) times daily. 01/03/22   Dettinger, Fransisca Kaufmann, MD  Cholecalciferol 100 MCG (4000 UT) CAPS Take 4,000 Units by mouth daily.    [provider]  dextromethorphan-guaiFENesin (MUCINEX DM) 30-600 MG 12hr tablet Take 1 tablet by mouth 2 (two) times daily as needed for cough.    [provider]  losartan (COZAAR) 50 MG tablet Take 1 tablet (50 mg total) by mouth daily. 01/03/22   Dettinger, Fransisca Kaufmann, MD  [place] [id number] MG/ML SOAJ INJECT 140MG  UNDER THE SKIN EVERY 2 WEEKS 01/20/22   Minus Breeding, MD  sildenafil (REVATIO) 20 MG tablet TAKE 1-5 TABLETS (20-100 MG  TOTAL) BY MOUTH AS NEEDED. 01/09/22   Chevis Pretty, FNP    Family History    Family History  Problem Relation Age of Onset   Dementia Mother    Colon cancer Mother    Prostate cancer Mother    Heart disease Father    Hyperlipidemia Father    Pancreatic cancer Sister    Colon cancer Maternal Grandmother    Esophageal cancer Neg Hx    Stomach cancer Neg Hx    He indicated that his mother is deceased. He indicated that his father is deceased. He indicated that his sister is deceased. He indicated that his maternal grandmother is deceased. He indicated that his maternal grandfather is deceased. He indicated that his paternal grandmother is deceased. He indicated that his paternal grandfather is deceased. He indicated that the status of his neg hx is unknown.  Social History    Social History   Socioeconomic History   Marital status: Married    Spouse name: Not on file   Number of children: 1   Years of education: Not on file   Highest education level: Not on file  Occupational History   Occupation: retired  Tobacco Use   Smoking status: Former    Packs/day: 0.25    Years: 30.00    Additional pack years: 0.00    Total pack years: 7.50    Types: Cigarettes    Quit date: 07/21/2003    Years since quitting: 18.6   Smokeless tobacco: Never  Vaping Use   Vaping Use: Never used  Substance and Sexual Activity   Alcohol use: Yes    Alcohol/week: 1.0 standard drink of alcohol    Types: 1 Glasses of wine per week    Comment: occasional wine or beer   Drug use: No    Comment: marijuana use in past several yrs ago   Sexual activity: Yes  Other Topics Concern   Not on file  Social History Narrative   Pt lives in [place] with wife.  Retired from [employer].  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  One son.    Social Determinants of Health   Financial Resource Strain: Not on file  Food Insecurity: Not on file  Transportation Needs: Not  on file  Physical Activity: Not on file  Stress: Not on file  Social Connections: Not on file  Intimate Partner Violence: Not on file  Review of Systems    General:  No chills, fever, night sweats or weight changes.  Cardiovascular:  No chest pain, dyspnea on exertion, edema, orthopnea, palpitations, paroxysmal nocturnal dyspnea. Dermatological: No rash, lesions/masses Respiratory: No cough, dyspnea Urologic: No hematuria, dysuria Abdominal:   No nausea, vomiting, diarrhea, bright red blood per rectum, melena, or hematemesis Neurologic:  No visual changes, wkns, changes in mental status. All other systems reviewed and are otherwise negative except as noted above.  Physical Exam    VS:  BP 128/80   Pulse (!) 40   Ht 5\' 10"  (1.778 m)   Wt 159 lb 9.6 oz (72.4 kg)   SpO2 98%   BMI 22.90 kg/m  , BMI Body mass index is 22.9 kg/m. GEN: Well nourished, well developed, in no acute distress. HEENT: normal. Neck: Supple, no JVD, carotid bruits, or masses. Cardiac: Atrial flutter, no murmurs, rubs, or gallops. No clubbing, cyanosis, edema.  Radials/DP/PT 2+ and equal bilaterally.  Respiratory:  Respirations regular and unlabored, clear to auscultation bilaterally. GI: Soft, nontender, nondistended, BS + x 4. MS: no deformity or atrophy. Skin: warm and dry, no rash. Neuro:  Strength and sensation are intact. Psych: Normal affect.  Accessory Clinical Findings    Recent Labs: 01/03/2022: ALT 15; BUN 19; Creatinine, Ser 1.05; Hemoglobin 14.4; Platelets 218; Potassium 5.1; Sodium 141   Recent Lipid Panel    Component Value Date/Time   CHOL 112 01/03/2022 1326   CHOL 100 (L) 12/28/2014 1609   TRIG 44 01/03/2022 1326   TRIG 95 06/04/2015 1501   HDL 61 01/03/2022 1326   HDL 55 06/04/2015 1501   CHOLHDL 1.8 01/03/2022 1326   CHOLHDL 2.2 11/27/2015 1636   VLDL 17 11/27/2015 1636   LDLCALC 40 01/03/2022 1326   LDLCALC 33 12/28/2014 1609         ECG personally reviewed by me  today-atrial flutter with slow ventricular response of 40 bpm- No acute changes  Nuclear stress test 10/18/2021   Findings are consistent with prior myocardial infarction. The study is intermediate risk.   No ST deviation was noted.   Left ventricular function is normal. End diastolic cavity size is normal.   Prior study available for comparison from 08/24/2018.   small size and moderate intensity fixed apical defect concerning for prior apical infarct. Unchanged from prior 08/24/2018  AAA duplex 10/25/2021  Indications: Follow up exam for known AAA.     Comparison Study: 03/09/20: There is evidence of abnormal dilatation of the                    proximal, mid and distal Abdominal aorta. Largest  measurement                   proximal aorta 4.1 cm.   Assessment & Plan   1.  Paroxysmal atrial fibrillation, atrial flutter with slow ventricular response-has been having alerts from his watch said he has having a lower resting heart rate.  Noticed that over the last month he has been more fatigued has had less endurance and has had lack of energy.  Compliant with apixaban.  Denies bleeding issues.  EKG today shows atrial flutter with slow ventricular response.  Patient not on AV nodal blocking agents.  Denies lightheadedness, presyncope or syncope.  He was exercised in the clinic today and was noted to have an increase in his heart rate with increased physical activity. Continue apixaban Avoid triggers caffeine, chocolate, EtOH, dehydration etc. CBC, BMP,  TSH Refer to EP ED precautions reviewed. Case reviewed with Dr. Percival Spanish  Essential hypertension-BP today 128/80. Continue current medical therapy Heart healthy low-sodium diet Maintain blood pressure log  Coronary artery disease-no chest pain today. Continue Repatha Heart healthy low-sodium high-fiber diet Increase physical activity as tolerated  Hyperlipidemia-LDL 40 on 01/03/22.  Statin intolerant. Continue Repatha Heart healthy  low-sodium high-fiber diet Increase physical activity as tolerated  Abdominal aortic aneurysm-denies episodes of back pain.  Blood pressure well-controlled.  AAA duplex 10/25/2021 showed largest proximal aorta measurement of 4.1 cm Maintain good blood pressure control. Plan for repeat AAA duplex 10/24  Disposition: Follow-up with Dr. Percival Spanish or me in 2-3 months.   Jossie Ng. Deaisha Welborn NP-C     03/28/2022, 2:30 PM [address] [place] [address] Office [phone] Fax [phone]    I spent 15 minutes examining this patient, reviewing medications, and using patient centered shared decision making involving her cardiac care.  Prior to her visit I spent greater than 20 minutes reviewing her past medical history,  medications, and prior cardiac tests.

## 2022-03-28 ENCOUNTER — Ambulatory Visit: Payer: Medicare PPO | Attending: General Practice | Admitting: General Practice

## 2022-03-28 ENCOUNTER — Encounter: Payer: Self-pay | Admitting: General Practice

## 2022-03-28 VITALS — BP 128/80 | HR 40 | Ht 70.0 in | Wt 159.6 lb

## 2022-03-28 DIAGNOSIS — I714 Abdominal aortic aneurysm, without rupture, unspecified: Secondary | ICD-10-CM

## 2022-03-28 DIAGNOSIS — I480 Paroxysmal atrial fibrillation: Secondary | ICD-10-CM | POA: Diagnosis not present

## 2022-03-28 DIAGNOSIS — I10 Essential (primary) hypertension: Secondary | ICD-10-CM | POA: Diagnosis not present

## 2022-03-28 DIAGNOSIS — I4891 Unspecified atrial fibrillation: Secondary | ICD-10-CM | POA: Diagnosis not present

## 2022-03-28 DIAGNOSIS — E785 Hyperlipidemia, unspecified: Secondary | ICD-10-CM

## 2022-03-28 DIAGNOSIS — I25119 Atherosclerotic heart disease of native coronary artery with unspecified angina pectoris: Secondary | ICD-10-CM | POA: Diagnosis not present

## 2022-03-28 NOTE — Patient Instructions (Signed)
Medication Instructions:  The current medical regimen is effective;  continue present plan and medications as directed. Please refer to the Current Medication list given to you today.  *If you need a refill on your cardiac medications before your next appointment, please call your pharmacy*  Lab Work: CBC BMET AND TSH TODAY If you have labs (blood work) drawn today and your tests are completely normal, you will receive your results only by:  [place] (if you have MyChart) OR A paper copy in the mail If you have any lab test that is abnormal or we need to change your treatment, we will call you to review the results.  Other Instructions IF YOU GET LIGHTHEADED OR DIZZY GO TO THE ER  REFER TO EP  Follow-Up: At [facility], you and your health needs are our priority.  As part of our continuing mission to provide you with exceptional heart care, we have created designated Provider Care Teams.  These Care Teams include your primary Cardiologist (physician) and Advanced Practice Providers (APPs -  Physician Assistants and Nurse Practitioners) who all work together to provide you with the care you need, when you need it.  Your next appointment:   AFTER EP   Provider:   Minus Breeding, MD  or Coletta Memos, FNP

## 2022-03-29 LAB — CBC
Hematocrit: 47.2 % (ref 37.5–51.0)
Hemoglobin: 15.8 g/dL (ref 13.0–17.7)
MCH: 31.3 pg (ref 26.6–33.0)
MCHC: 33.5 g/dL (ref 31.5–35.7)
MCV: 94 fL (ref 79–97)
Platelets: 202 10*3/uL (ref 150–450)
RBC: 5.04 x10E6/uL (ref 4.14–5.80)
RDW: 14.2 % (ref 11.6–15.4)
WBC: 8.6 10*3/uL (ref 3.4–10.8)

## 2022-03-29 LAB — TSH: TSH: 2.25 u[IU]/mL (ref 0.450–4.500)

## 2022-03-29 LAB — BASIC METABOLIC PANEL
BUN/Creatinine Ratio: 18 (ref 10–24)
BUN: 21 mg/dL (ref 8–27)
CO2: 23 mmol/L (ref 20–29)
Calcium: 10.1 mg/dL (ref 8.6–10.2)
Chloride: 104 mmol/L (ref 96–106)
Creatinine, Ser: 1.14 mg/dL (ref 0.76–1.27)
Glucose: 66 mg/dL — ABNORMAL LOW (ref 70–99)
Potassium: 5.2 mmol/L (ref 3.5–5.2)
Sodium: 141 mmol/L (ref 134–144)
eGFR: 66 mL/min/{1.73_m2} (ref >59)

## 2022-03-31 ENCOUNTER — Encounter: Payer: Self-pay | Admitting: Cardiovascular Disease

## 2022-03-31 ENCOUNTER — Ambulatory Visit: Payer: Medicare PPO | Attending: Cardiovascular Disease | Admitting: Cardiovascular Disease

## 2022-03-31 VITALS — BP 173/85 | HR 49 | Ht 70.0 in | Wt 161.6 lb

## 2022-03-31 DIAGNOSIS — I480 Paroxysmal atrial fibrillation: Secondary | ICD-10-CM | POA: Diagnosis not present

## 2022-03-31 NOTE — Progress Notes (Signed)
Electrophysiology Office Note:    Date:  03/31/2022   ID:  Dustin Bauer, DOB 27-Oct-1945, MRN [id number]  PCP:  Dettinger, Fransisca Kaufmann, MD   [place] Providers Cardiologist:  Minus Breeding, MD     Referring MD: Dettinger, Fransisca Kaufmann, MD   History of Present Illness:    Dustin Bauer is a 77 y.o. male with a hx listed below, significant for AAA, CAD status post inferior wall MI and  stent, atrial fibrillation status post ablation in [facility] in 2012, referred for arrhythmia management. He had a loop recorder, explanted in 2017.  He had recurrence of show fibrillation in March.  He was seen in clinic on March 15 reportedly in atrial flutter, but the EKG was not scanned into epic unfortunately.  The patient does have an Apple watch and has taken multiple tracings that have shown atrial fibrillation.  He is feeling well today, no palpitations shortness of breath, presyncope, syncope.  Both his Apple Watch and the EKG today showed normal sinus rhythm.   Past Medical History:  Diagnosis Date   AAA (abdominal aortic aneurysm) ([place]) 01/20/2017   3.9cm infrarenal   Allergy to ACE inhibitors    Cough and fatigue with ACEI   Aortic atherosclerosis ([place]) 08/18/2017   Noted on CT chest   Atrial fibrillation (HCC)    CAD (coronary artery disease) stent x 1 2011 and 1  2014   a. LHC in 2011 for stable angina >> DES to pLAD [facility])  //  b. inf-post STEMI 7/15 c/b CHB with jxn escape >> LHC: OM1 60-70, pLAD 30-40, m-d RCA occluded, EF 50-55 >> PCI: 3 x 33 mm Xience DES to RCA  //  c. LHC 8/17: dLM 20, pLAD 20, pLAD stent ok, pRI 30, pRCA stent ok with 30% ISR   Colon polyps    Diverticulosis    Emphysema lung ([place]) 08/18/2017   Noted on CT chest   Gastric stenosis 08/27/2016   at pylorus, noted on endoscopy   GERD (gastroesophageal reflux disease)    Hydrocele, bilateral 01/30/2017   Noted on CT chest   Hyperlipidemia    Myalgias with Zocor, Crestor, atorvastatin.    Nicotine  abuse    PAF (paroxysmal atrial fibrillation) [facility])    s/p ablation by Dr Roxan Hockey at [facility] in 2012 // Xarelto anticoagulation  //  ILR removed 2/17 // Event Monitor 11/17: Predominantly NSR (90%). Paroxysmal atrial fibrillation (10%).  Atrial fibrillation appears to be rate-controlled when it occurs.    Prostate cancer ([place]) dx 2019   Pulmonary nodule, right 08/18/2017   6 mm right upper lobe, 5.5 mm right middle lobe, Noted on CT Chest   Pyloric stenosis    Thoracic ascending aortic aneurysm ([place]) 01/30/2017   3.9 cm    Past Surgical History:  Procedure Laterality Date   astentngioplasty/     ATRIAL FIBRILLATION ABLATION  07/2010   Afib ablation by Dr Roxan Hockey at [facility] in 2012   [place] N/A 07/31/2016   Procedure: Larrie Kass DILATION;  Surgeon: Manus Gunning, MD;  [facility];  Service: Gastroenterology;  Laterality: N/A;   BALLOON DILATION N/A 08/27/2016   Procedure: BALLOON DILATION;  Surgeon: Manus Gunning, MD;  [facility];  Service: Gastroenterology;  Laterality: N/A;   CARDIAC CATHETERIZATION     CARDIAC CATHETERIZATION N/A 08/17/2015   Procedure: Left Heart Cath and Coronary Angiography;  Surgeon: Larey Dresser, MD;  Location: [place] CV LAB;  Service:  Cardiovascular;  Laterality: N/A;   COLONOSCOPY W/ POLYPECTOMY  06/26/2016   CORONARY STENT PLACEMENT     x 2   EP IMPLANTABLE DEVICE N/A 03/09/2015   Procedure: Loop Recorder Removal;  Surgeon: [name], MD;  Location: [place] CV LAB;  Service: Cardiovascular;  Laterality: N/A;   ESOPHAGOGASTRODUODENOSCOPY N/A 07/31/2016   Procedure: ESOPHAGOGASTRODUODENOSCOPY (EGD) also have pediatric scope available;  Surgeon: Manus Gunning, MD;  [facility];  Service: Gastroenterology;  Laterality: N/A;   ESOPHAGOGASTRODUODENOSCOPY N/A 08/27/2016   Procedure: ESOPHAGOGASTRODUODENOSCOPY (EGD);  Surgeon: Manus Gunning, MD;  [facility];  Service: Gastroenterology;  Laterality: N/A;   implantable loop recorder implantation  08/13/2010   MDT Reveal XT implanted by Dr Roxan Hockey for afib management post ablation   LEFT HEART CATH N/A 07/15/2013   Procedure: LEFT HEART CATH;  Surgeon: Clent Demark, MD;  Location: [facility] CATH LAB;  Service: Cardiovascular;  Laterality: N/A;   loop recorder removed  2017   RADIOACTIVE SEED IMPLANT N/A 11/27/2017   Procedure: RADIOACTIVE SEED IMPLANT/BRACHYTHERAPY IMPLANT;  Surgeon: Kathie Rhodes, MD;  Location: [place];  Service: Urology;  Laterality: N/A;   SPACE OAR INSTILLATION N/A 11/27/2017   Procedure: SPACE OAR INSTILLATION;  Surgeon: Kathie Rhodes, MD;  [facility];  Service: Urology;  Laterality: N/A;   TONSILLECTOMY  age 82    Current Medications: Current Meds  Medication Sig   acetaminophen (TYLENOL) 325 MG tablet Take 325 mg by mouth every 6 (six) hours as needed (FOR PAIN.).   apixaban (ELIQUIS) 5 MG TABS tablet Take 1 tablet (5 mg total) by mouth 2 (two) times daily.   Cholecalciferol 100 MCG (4000 UT) CAPS Take 4,000 Units by mouth daily.   dextromethorphan-guaiFENesin (MUCINEX DM) 30-600 MG 12hr tablet Take 1 tablet by mouth 2 (two) times daily as needed for cough.   losartan (COZAAR) 50 MG tablet Take 1 tablet (50 mg total) by mouth daily.   REPATHA [place]CL[place] [id number] MG/ML SOAJ INJECT 140MG  UNDER THE SKIN EVERY 2 WEEKS   sildenafil (REVATIO) 20 MG tablet TAKE 1-5 TABLETS (20-100 MG TOTAL) BY MOUTH AS NEEDED.     Allergies:   Clopidogrel, Crestor [rosuvastatin calcium], Lipitor [atorvastatin calcium], and Statins   Social and Family History: Reviewed in Epic  ROS:   Please see the history of present illness.    All other systems reviewed and are negative.  EKGs/Labs/Other Studies Reviewed Today:    Echocardiogram:   Stess test: 10/2021   Findings are consistent with prior myocardial infarction. The study is  intermediate risk.   No ST deviation was noted.   Left ventricular function is normal. End diastolic cavity size is normal.   Prior study available for comparison from 08/24/2018.    Monitors:    Advanced imaging:   EKG:  Last EKG results: today Sinus bradycardia with 1st degree AV block   Recent Labs: 01/03/2022: ALT 15 03/28/2022: BUN 21; Creatinine, Ser 1.14; Hemoglobin 15.8; Platelets 202; Potassium 5.2; Sodium 141; TSH 2.250     Physical Exam:    VS:  BP (!) 173/85   Pulse (!) 49   Ht 5\' 10"  (1.778 m)   Wt 161 lb 9.6 oz (73.3 kg)   SpO2 98%   BMI 23.19 kg/m     Wt Readings from Last 3 Encounters:  03/31/22 161 lb 9.6 oz (73.3 kg)  03/28/22 159 lb 9.6 oz (72.4 kg)  01/03/22 159 lb (72.1 kg)  GEN:  Well nourished, well developed in no acute distress CARDIAC: Regular rhythm, bradycardic no murmurs, rubs, gallops RESPIRATORY:  Normal work of breathing MUSCULOSKELETAL: no edema    ASSESSMENT & PLAN:    Atrial fibrillation - s/p ablation at [facility] in 2012, with symptomatic recurrence 03/2022 - I recommended EP study to assess the prior PVI   We discussed the indication, rationale, logistics, anticipated benefits, and potential risks of the ablation procedure including but not limited to -- bleed at the groin access site, chest pain, damage to nearby organs such as the diaphragm, lungs, or esophagus, need for a drainage tube, or prolonged hospitalization. I explained that the risk for stroke, heart attack, need for open chest surgery, or even death is very low but not zero. he  expressed understanding and wishes to proceed.   Atrial flutter? - observed in clinic 3/15, but not documented -We will see if flutter is inducible during EP study and pursue whatever flutter is induce -- he has risk of atypical flutter with prior PVI. -If flutter is noninducible, we will plan for empiric flutter line  Secondary hypercoagulable state - continue apixaban  5mg   Sinus bradycardia        Medication Adjustments/Labs and Tests Ordered: Current medicines are reviewed at length with the patient today.  Concerns regarding medicines are outlined above.  No orders of the defined types were placed in this encounter.  No orders of the defined types were placed in this encounter.    Signed, Melida Quitter, MD  03/31/2022 3:27 PM    [place]

## 2022-03-31 NOTE — Patient Instructions (Signed)
Medication Instructions:  Your physician recommends that you continue on your current medications as directed. Please refer to the Current Medication list given to you today.   *If you need a refill on your cardiac medications before your next appointment, please call your pharmacy*   Lab Work: 1 week prior to CT scan: CBC, BMET (you do not need to be fasting) You may come to the lab any time between 7:30am and 4:30pm. If you have labs (blood work) drawn today and your tests are completely normal, you will receive your results only by: [place] (if you have MyChart) OR A paper copy in the mail If you have any lab test that is abnormal or we need to change your treatment, we will call you to review the results.   Testing/Procedures: Your physician has requested that you have cardiac CT. Cardiac computed tomography (CT) is a painless test that uses an x-ray machine to take clear, detailed pictures of your heart. For further information please visit [facility]. Please follow instruction sheet as given.   Your physician has recommended that you have an ablation. Catheter ablation is a medical procedure used to treat some cardiac arrhythmias (irregular heartbeats). During catheter ablation, a long, thin, flexible tube is put into a blood vessel in your groin (upper thigh), or neck. This tube is called an ablation catheter. It is then guided to your heart through the blood vessel. Radio frequency waves destroy small areas of heart tissue where abnormal heartbeats may cause an arrhythmia to start.    The EP procedure scheduler, April G, will call you to schedule your procedure and review instructions with you. Please allow 2-4 weeks for her to contact you.     Follow-Up: At [facility], you and your health needs are our priority.  As part of our continuing mission to provide you with exceptional heart care, we have created designated Provider Care Teams.  These Care Teams  include your primary Cardiologist (physician) and Advanced Practice Providers (APPs -  Physician Assistants and Nurse Practitioners) who all work together to provide you with the care you need, when you need it.   Your next appointment:   4 week(s) after your ablation   Provider:   You will follow up in the [facility] located at [facility]. Your provider will be: Clint R. Fenton, PA-C A-C

## 2022-04-11 ENCOUNTER — Other Ambulatory Visit: Payer: Self-pay

## 2022-04-11 DIAGNOSIS — Z01818 Encounter for other preprocedural examination: Secondary | ICD-10-CM

## 2022-04-11 DIAGNOSIS — I480 Paroxysmal atrial fibrillation: Secondary | ICD-10-CM

## 2022-05-02 ENCOUNTER — Other Ambulatory Visit: Payer: Self-pay | Admitting: Cardiology

## 2022-05-02 DIAGNOSIS — I25119 Atherosclerotic heart disease of native coronary artery with unspecified angina pectoris: Secondary | ICD-10-CM

## 2022-05-02 DIAGNOSIS — E785 Hyperlipidemia, unspecified: Secondary | ICD-10-CM

## 2022-05-05 ENCOUNTER — Ambulatory Visit: Payer: Medicare PPO | Admitting: General Practice

## 2022-05-20 NOTE — Progress Notes (Unsigned)
Cardiology Clinic Note   Patient Name: Dustin Bauer Date of Encounter: 05/22/2022  Primary Care Provider:  Dettinger, Elige Radon, MD Primary Cardiologist:  Rollene Rotunda, MD  Patient Profile    Dustin Bauer 77 year old male presents the clinic today for follow-up evaluation of his paroxysmal atrial fibrillation and coronary artery disease.  Past Medical History    Past Medical History:  Diagnosis Date   AAA (abdominal aortic aneurysm) (HCC) 01/20/2017   3.9cm infrarenal   Allergy to ACE inhibitors    Cough and fatigue with ACEI   Aortic atherosclerosis (HCC) 08/18/2017   Noted on CT chest   Atrial fibrillation (HCC)    CAD (coronary artery disease) stent x 1 2011 and 1  2014   a. LHC in 2011 for stable angina >> DES to pLAD [facility])  //  b. inf-post STEMI 7/15 c/b CHB with jxn escape >> LHC: OM1 60-70, pLAD 30-40, m-d RCA occluded, EF 50-55 >> PCI: 3 x 33 mm Xience DES to RCA  //  c. LHC 8/17: dLM 20, pLAD 20, pLAD stent ok, pRI 30, pRCA stent ok with 30% ISR   Colon polyps    Diverticulosis    Emphysema lung (HCC) 08/18/2017   Noted on CT chest   Gastric stenosis 08/27/2016   at pylorus, noted on endoscopy   GERD (gastroesophageal reflux disease)    Hydrocele, bilateral 01/30/2017   Noted on CT chest   Hyperlipidemia    Myalgias with Zocor, Crestor, atorvastatin.    Nicotine abuse    PAF (paroxysmal atrial fibrillation) [facility])    s/p ablation by Dr Dorris Fetch at [facility] in 2012 // Xarelto anticoagulation  //  ILR removed 2/17 // Event Monitor 11/17: Predominantly NSR (90%). Paroxysmal atrial fibrillation (10%).  Atrial fibrillation appears to be rate-controlled when it occurs.    Prostate cancer (HCC) dx 2019   Pulmonary nodule, right 08/18/2017   6 mm right upper lobe, 5.5 mm right middle lobe, Noted on CT Chest   Pyloric stenosis    Thoracic ascending aortic aneurysm (HCC) 01/30/2017   3.9 cm   Past Surgical History:  Procedure Laterality Date    astentngioplasty/     ATRIAL FIBRILLATION ABLATION  07/2010   Afib ablation by Dr Dorris Fetch at [facility] in 2012   BALLOON DILATION N/A 07/31/2016   Procedure: Marvis Repress DILATION;  Surgeon: Ruffin Frederick, MD;  Location: [facility] ENDOSCOPY;  Service: Gastroenterology;  Laterality: N/A;   BALLOON DILATION N/A 08/27/2016   Procedure: BALLOON DILATION;  Surgeon: Ruffin Frederick, MD;  Location: [facility] ENDOSCOPY;  Service: Gastroenterology;  Laterality: N/A;   CARDIAC CATHETERIZATION     CARDIAC CATHETERIZATION N/A 08/17/2015   Procedure: Left Heart Cath and Coronary Angiography;  Surgeon: Laurey Morale, MD;  Location: [facility] INVASIVE CV LAB;  Service: Cardiovascular;  Laterality: N/A;   COLONOSCOPY W/ POLYPECTOMY  06/26/2016   CORONARY STENT PLACEMENT     x 2   EP IMPLANTABLE DEVICE N/A 03/09/2015   Procedure: Loop Recorder Removal;  Surgeon: Hillis Range, MD;  Location: [facility] INVASIVE CV LAB;  Service: Cardiovascular;  Laterality: N/A;   ESOPHAGOGASTRODUODENOSCOPY N/A 07/31/2016   Procedure: ESOPHAGOGASTRODUODENOSCOPY (EGD) also have pediatric scope available;  Surgeon: Ruffin Frederick, MD;  Location: [facility] ENDOSCOPY;  Service: Gastroenterology;  Laterality: N/A;   ESOPHAGOGASTRODUODENOSCOPY N/A 08/27/2016   Procedure: ESOPHAGOGASTRODUODENOSCOPY (EGD);  Surgeon: Ruffin Frederick, MD;  Location: [facility];  Service: Gastroenterology;  Laterality: N/A;   implantable loop recorder implantation  08/13/2010  MDT Reveal XT implanted by Dr Dorris Fetch for afib management post ablation   LEFT HEART CATH N/A 07/15/2013   Procedure: LEFT HEART CATH;  Surgeon: Robynn Pane, MD;  Location: [facility] CATH LAB;  Service: Cardiovascular;  Laterality: N/A;   loop recorder removed  2017   RADIOACTIVE SEED IMPLANT N/A 11/27/2017   Procedure: RADIOACTIVE SEED IMPLANT/BRACHYTHERAPY IMPLANT;  Surgeon: Ihor Gully, MD;  Location: [facility] [place];  Service: Urology;  Laterality: N/A;   SPACE  OAR INSTILLATION N/A 11/27/2017   Procedure: SPACE OAR INSTILLATION;  Surgeon: Ihor Gully, MD;  Location: [facility];  Service: Urology;  Laterality: N/A;   TONSILLECTOMY  age 73    Allergies  Allergies  Allergen Reactions   Clopidogrel Other (See Comments)    unknown reaction   Crestor [Rosuvastatin Calcium] Other (See Comments)    Extreme muscular weakness   Lipitor [Atorvastatin Calcium] Other (See Comments)    Extreme muscular weakness    Statins Other (See Comments)    Extreme muscular weakness     History of Present Illness    Dustin Bauer has a PMH of paroxysmal atrial fibrillation, coronary artery disease, HTN, abdominal aortic aneurysm, chronic ischemic heart disease, pyloric stricture, HLD, statin intolerance, acute inferior wall MI, heart block, and chronic anticoagulation.  He is status post ablation which was done at [facility].  He underwent cardiac catheterization with PCI to his LAD in 2011, inferior posterior STEMI in 2015 requiring DES to an occluded RCA 100%.  His EF at that time was 50-55% and was complicated by complete heart block which resolved without need of PPM.  He underwent subsequent cardiac catheterization in 2017 which showed patent RCA and LAD stent.  His heart rate was noted to be in the 40s.  He was noted to have a small fixed moderate intensity mid anterior septal perfusion defect 2020.  He was seen in follow-up by Dr. Antoine Poche on 10/09/2021.  During that time he was going to the gym 2 days/week.  He enjoyed hiking with his dogs.  He had been having some lightheadedness and mild discomfort along with shortness of breath with exercise.  He denied new shortness of breath, PND and orthopnea.  He denied palpitations, presyncope and syncope.  He did note low heart rates.  He was asymptomatic.  He contacted the nurse triage line on 03/25/2022 and reported that his heart felt like it was in and out of atrial fibrillation.  He was rate controlled in  the 60s and compliant with his Eliquis.  He presented to the clinic 03/28/2022 for follow-up evaluation and stated over the last month he felt like he had been going in and out of atrial fibrillation.  He noted that his watch had been indicating his resting heart rate was in the 50s and at the time of his visit in the 40s.  He was not on any AV nodal blocking agents.  He underwent ablation at [facility] in 12 years ago.  He noted increased fatigue, lack of endurance and lack of energy.  His EKG today showed atrial flutter with slow ventricular response.  His blood pressure iwas well-controlled at 128/80.  I continued his current medication regimen, order CBC, BMP, TSH, and refer to EP for further evaluation.  I have reviewed ED precautions.  He reported understanding.  Follow-up after  EP visit was planned.  He was seen in follow-up by Dr. Nelly Laurence on 03/31/2022.  An EP study was recommended to assess  his prior PVI.  Risks and benefits were reviewed.  He presents to the clinic today for follow-up evaluation and states he continues to notice more frequent episodes of atrial fibrillation.  We reviewed his recent appointment with EP and has upcoming ablation scheduled for 08/14/2022.  He expressed understanding.  I will continue his current medication regimen.  His blood pressure today is well-controlled.  He continues to be very physically active.  Will plan follow-up with Dr. Antoine Poche in November or December.  Today he denies chest pain, shortness of breath, lower extremity edema, fatigue, palpitations, melena, hematuria, hemoptysis, diaphoresis, weakness, presyncope, syncope, orthopnea, and PND.   Home Medications    Prior to Admission medications   Medication Dustin Start Date End Date Taking? Authorizing Provider  acetaminophen (TYLENOL) 325 MG tablet Take 325 mg by mouth every 6 (six) hours as needed (FOR PAIN.).    [provider]  apixaban (ELIQUIS) 5 MG TABS tablet Take 1 tablet (5 mg  total) by mouth 2 (two) times daily. 01/03/22   Dettinger, Elige Radon, MD  Cholecalciferol 100 MCG (4000 UT) CAPS Take 4,000 Units by mouth daily.    [provider]  dextromethorphan-guaiFENesin (MUCINEX DM) 30-600 MG 12hr tablet Take 1 tablet by mouth 2 (two) times daily as needed for cough.    [provider]  losartan (COZAAR) 50 MG tablet Take 1 tablet (50 mg total) by mouth daily. 01/03/22   Dettinger, Elige Radon, MD  REPATHA SURECLICK 140 MG/ML SOAJ INJECT 140MG  UNDER THE SKIN EVERY 2 WEEKS 01/20/22   Rollene Rotunda, MD  sildenafil (REVATIO) 20 MG tablet TAKE 1-5 TABLETS (20-100 MG TOTAL) BY MOUTH AS NEEDED. 01/09/22   Bennie Pierini, FNP    Family History    Family History  Problem Relation Age of Onset   Dementia Mother    Colon cancer Mother    Prostate cancer Mother    Heart disease Father    Hyperlipidemia Father    Pancreatic cancer Sister    Colon cancer Maternal Grandmother    Esophageal cancer Neg Hx    Stomach cancer Neg Hx    He indicated that his mother is deceased. He indicated that his father is deceased. He indicated that his sister is deceased. He indicated that his maternal grandmother is deceased. He indicated that his maternal grandfather is deceased. He indicated that his paternal grandmother is deceased. He indicated that his paternal grandfather is deceased. He indicated that the status of his neg hx is unknown.  Social History    Social History   Socioeconomic History   Marital status: Married    Spouse name: Not on file   Number of children: 1   Years of education: Not on file   Highest education level: Not on file  Occupational History   Occupation: retired  Tobacco Use   Smoking status: Former    Packs/day: 0.25    Years: 30.00    Additional pack years: 0.00    Total pack years: 7.50    Types: Cigarettes    Quit date: 07/21/2003    Years since quitting: 18.8   Smokeless tobacco: Never  Vaping Use   Vaping Use: Never  used  Substance and Sexual Activity   Alcohol use: Yes    Alcohol/week: 1.0 standard drink of alcohol    Types: 1 Glasses of wine per week    Comment: occasional wine or beer   Drug use: No    Comment: marijuana use in past several yrs  ago   Sexual activity: Yes  Other Topics Concern   Not on file  Social History Narrative   Pt lives in [place] with wife.  Retired from department of [employer].  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  One son.    Social Determinants of Health   Financial Resource Strain: Not on file  Food Insecurity: Not on file  Transportation Needs: Not on file  Physical Activity: Not on file  Stress: Not on file  Social Connections: Not on file  Intimate Partner Violence: Not on file     Review of Systems    General:  No chills, fever, night sweats or weight changes.  Cardiovascular:  No chest pain, dyspnea on exertion, edema, orthopnea, palpitations, paroxysmal nocturnal dyspnea. Dermatological: No rash, lesions/masses Respiratory: No cough, dyspnea Urologic: No hematuria, dysuria Abdominal:   No nausea, vomiting, diarrhea, bright red blood per rectum, melena, or hematemesis Neurologic:  No visual changes, wkns, changes in mental status. All other systems reviewed and are otherwise negative except as noted above.  Physical Exam    VS:  BP 120/80   Pulse (!) 48   Ht 5\' 10"  (1.778 m)   Wt 159 lb 12.8 oz (72.5 kg)   SpO2 96%   BMI 22.93 kg/m  , BMI Body mass index is 22.93 kg/m. GEN: Well nourished, well developed, in no acute distress. HEENT: normal. Neck: Supple, no JVD, carotid bruits, or masses. Cardiac: Atrial flutter, no murmurs, rubs, or gallops. No clubbing, cyanosis, edema.  Radials/DP/PT 2+ and equal bilaterally.  Respiratory:  Respirations regular and unlabored, clear to auscultation bilaterally. GI: Soft, nontender, nondistended, BS + x 4. MS: no deformity or atrophy. Skin: warm and dry, no rash. Neuro:  Strength  and sensation are intact. Psych: Normal affect.  Accessory Clinical Findings    Recent Labs: 01/03/2022: ALT 15 03/28/2022: BUN 21; Creatinine, Ser 1.14; Hemoglobin 15.8; Platelets 202; Potassium 5.2; Sodium 141; TSH 2.250   Recent Lipid Panel    Component Value Date/Time   CHOL 112 01/03/2022 1326   CHOL 100 (L) 12/28/2014 1609   TRIG 44 01/03/2022 1326   TRIG 95 06/04/2015 1501   HDL 61 01/03/2022 1326   HDL 55 06/04/2015 1501   CHOLHDL 1.8 01/03/2022 1326   CHOLHDL 2.2 11/27/2015 1636   VLDL 17 11/27/2015 1636   LDLCALC 40 01/03/2022 1326   LDLCALC 33 12/28/2014 1609         ECG personally reviewed by me today-none today.  EKG 03/28/2022 atrial flutter with slow ventricular response of 40 bpm- No acute changes  Nuclear stress test 10/18/2021   Findings are consistent with prior myocardial infarction. The study is intermediate risk.   No ST deviation was noted.   Left ventricular function is normal. End diastolic cavity size is normal.   Prior study available for comparison from 08/24/2018.   small size and moderate intensity fixed apical defect concerning for prior apical infarct. Unchanged from prior 08/24/2018  AAA duplex 10/25/2021  Indications: Follow up exam for known AAA.     Comparison Study: 03/09/20: There is evidence of abnormal dilatation of the                    proximal, mid and distal Abdominal aorta. Largest  measurement                   proximal aorta 4.1 cm.   Assessment & Plan   1.  Paroxysmal atrial fibrillation-heart  rate today 48.  Atrial flutter with slow ventricular response-was seen and evaluated by EP.  Underwent previous ablation procedure at [facility] around 12 years ago.   Continue apixaban Proceed to EP study 08/14/2022.  Essential hypertension-BP today 120/80. Continue losartan Low-sodium diet Maintain blood pressure log  Coronary artery disease-asymptomatic.   Continue Repatha Heart healthy low-sodium high-fiber  diet-reviewed  Hyperlipidemia-LDL 40 on 01/03/22.  Statin intolerant.  Continue Repatha Heart healthy low-sodium high-fiber diet Increase physical activity as tolerated Repeat fasting lipids and LFTs 12/24  Abdominal aortic aneurysm-denies chest and back pain.  Blood pressure continues to be well-controlled.  AAA duplex 10/25/2021 showed largest proximal aorta measurement of 4.1 cm Maintain good blood pressure control. Plan for repeat AAA duplex   Disposition: Follow-up with Dr. Antoine Poche or me in 6-7 months.   Thomasene Ripple. Damaree Sargent NP-C     05/22/2022, 1:36 PM [facility] [address] Office [phone] Fax [phone]    I spent 13 minutes examining this patient, reviewing medications, and using patient centered shared decision making involving her cardiac care.  Prior to her visit I spent greater than 20 minutes reviewing her past medical history,  medications, and prior cardiac tests.

## 2022-05-22 ENCOUNTER — Encounter: Payer: Self-pay | Admitting: General Practice

## 2022-05-22 ENCOUNTER — Ambulatory Visit: Payer: Medicare PPO | Attending: General Practice | Admitting: General Practice

## 2022-05-22 VITALS — BP 120/80 | HR 48 | Ht 70.0 in | Wt 159.8 lb

## 2022-05-22 DIAGNOSIS — I480 Paroxysmal atrial fibrillation: Secondary | ICD-10-CM | POA: Diagnosis not present

## 2022-05-22 DIAGNOSIS — I10 Essential (primary) hypertension: Secondary | ICD-10-CM | POA: Diagnosis not present

## 2022-05-22 DIAGNOSIS — I714 Abdominal aortic aneurysm, without rupture, unspecified: Secondary | ICD-10-CM

## 2022-05-22 DIAGNOSIS — I25119 Atherosclerotic heart disease of native coronary artery with unspecified angina pectoris: Secondary | ICD-10-CM | POA: Diagnosis not present

## 2022-05-22 DIAGNOSIS — E785 Hyperlipidemia, unspecified: Secondary | ICD-10-CM

## 2022-05-22 NOTE — Patient Instructions (Signed)
Medication Instructions:  The current medical regimen is effective;  continue present plan and medications as directed. Please refer to the Current Medication list given to you today. *If you need a refill on your cardiac medications before your next appointment, please call your pharmacy*  Lab Work: NONE If you have labs (blood work) drawn today and your tests are completely normal, you will receive your results only by:  MyChart Message (if you have MyChart) OR A paper copy in the mail If you have any lab test that is abnormal or we need to change your treatment, we will call you to review the results.  Testing/Procedures: ABLATION IN AUGUST  Follow-Up: At [facility], you and your health needs are our priority.  As part of our continuing mission to provide you with exceptional heart care, we have created designated Provider Care Teams.  These Care Teams include your primary Cardiologist (physician) and Advanced Practice Providers (APPs -  Physician Assistants and Nurse Practitioners) who all work together to provide you with the care you need, when you need it.  Your next appointment:   6-7 month(s)  Provider:   Rollene Rotunda, MD  [place]  Other Instructions

## 2022-05-23 ENCOUNTER — Ambulatory Visit (HOSPITAL_COMMUNITY)
Admission: RE | Admit: 2022-05-23 | Discharge: 2022-05-23 | Disposition: A | Discharge status: Home / Self Care | Payer: Medicare PPO | Source: Ambulatory Visit | Attending: Vascular Surgery | Admitting: Vascular Surgery

## 2022-05-23 ENCOUNTER — Ambulatory Visit (INDEPENDENT_AMBULATORY_CARE_PROVIDER_SITE_OTHER)
Admission: RE | Admit: 2022-05-23 | Discharge: 2022-05-23 | Disposition: A | Discharge status: Home / Self Care | Payer: Medicare PPO | Source: Ambulatory Visit | Attending: Vascular Surgery | Admitting: Vascular Surgery

## 2022-05-23 ENCOUNTER — Encounter: Payer: Self-pay | Admitting: Physician Assistant

## 2022-05-23 ENCOUNTER — Ambulatory Visit: Payer: Medicare PPO | Admitting: Physician Assistant

## 2022-05-23 VITALS — BP 151/71 | HR 40 | Temp 97.4°F | Resp 16 | Ht 70.0 in | Wt 159.0 lb

## 2022-05-23 DIAGNOSIS — I714 Abdominal aortic aneurysm, without rupture, unspecified: Secondary | ICD-10-CM

## 2022-05-23 DIAGNOSIS — I739 Peripheral vascular disease, unspecified: Secondary | ICD-10-CM

## 2022-05-23 NOTE — Progress Notes (Signed)
HISTORY AND PHYSICAL     CC:  follow up. Requesting Provider:  Dettinger, Elige Radon, MD  HPI: This is a 77 y.o. male who is here today for follow up for AAA.     He has been followed since 2017 for a 3.9 cm AAA that was found on CT.  He has hx of brachytherapy for prostate cancer.    Pt was last seen 10/25/2021 and at that time, he was not having any back or abdominal pain.  He had gotten a new puppy and was staying active.  He was not having claudication or rest pain or non healing wounds.   He had not had duplex to evaluate for popliteal aneurysms so this was scheduled for today.  The pt returns today for follow up.  He states that he is doing well without any issues.  He continues to go to the gym a couple of times a week.  He goes hiking and does not have any claudication, rest pain or non healing wounds.  He has not had any abdominal or back pain.  He was unable to take statins.  He is now on Repatha and is tolerating this well.   His blood pressure is a little elevated today.  He states he saw his cardiologist yesterday and it was 120/80.  He states they have increased his dose of Losartan to 50mg  daily.   He still has his puppy Seward Grater, who is keeping him busy.    The pt is on a statin for cholesterol management.    allergy The pt is not on an aspirin.    Other AC:  Eliquis for afib.   The pt is on ARB for hypertension.  The pt is not on medication for diabetes. Tobacco hx:  former  Pt does not have family hx of AAA. He does have children.   Past Medical History:  Diagnosis Date   AAA (abdominal aortic aneurysm) (HCC) 01/20/2017   3.9cm infrarenal   Allergy to ACE inhibitors    Cough and fatigue with ACEI   Aortic atherosclerosis (HCC) 08/18/2017   Noted on CT chest   Atrial fibrillation (HCC)    CAD (coronary artery disease) stent x 1 2011 and 1  2014   a. LHC in 2011 for stable angina >> DES to pLAD [facility])  //  b. inf-post STEMI 7/15 c/b CHB with jxn escape >> LHC: OM1  60-70, pLAD 30-40, m-d RCA occluded, EF 50-55 >> PCI: 3 x 33 mm Xience DES to RCA  //  c. LHC 8/17: dLM 20, pLAD 20, pLAD stent ok, pRI 30, pRCA stent ok with 30% ISR   Colon polyps    Diverticulosis    Emphysema lung (HCC) 08/18/2017   Noted on CT chest   Gastric stenosis 08/27/2016   at pylorus, noted on endoscopy   GERD (gastroesophageal reflux disease)    Hydrocele, bilateral 01/30/2017   Noted on CT chest   Hyperlipidemia    Myalgias with Zocor, Crestor, atorvastatin.    Nicotine abuse    PAF (paroxysmal atrial fibrillation) [facility])    s/p ablation by Dr Dorris Fetch at [facility] in 2012 // Xarelto anticoagulation  //  ILR removed 2/17 // Event Monitor 11/17: Predominantly NSR (90%). Paroxysmal atrial fibrillation (10%).  Atrial fibrillation appears to be rate-controlled when it occurs.    Prostate cancer (HCC) dx 2019   Pulmonary nodule, right 08/18/2017   6 mm right upper lobe, 5.5 mm right middle lobe, Noted on CT  Chest   Pyloric stenosis    Thoracic ascending aortic aneurysm (HCC) 01/30/2017   3.9 cm    Past Surgical History:  Procedure Laterality Date   astentngioplasty/     ATRIAL FIBRILLATION ABLATION  07/2010   Afib ablation by Dr Dorris Fetch at [facility] in 2012   BALLOON DILATION N/A 07/31/2016   Procedure: Marvis Repress DILATION;  Surgeon: Ruffin Frederick, MD;  Location: [facility] ENDOSCOPY;  Service: Gastroenterology;  Laterality: N/A;   BALLOON DILATION N/A 08/27/2016   Procedure: BALLOON DILATION;  Surgeon: Ruffin Frederick, MD;  Location: [facility] ENDOSCOPY;  Service: Gastroenterology;  Laterality: N/A;   CARDIAC CATHETERIZATION     CARDIAC CATHETERIZATION N/A 08/17/2015   Procedure: Left Heart Cath and Coronary Angiography;  Surgeon: Laurey Morale, MD;  Location: [facility] INVASIVE CV LAB;  Service: Cardiovascular;  Laterality: N/A;   COLONOSCOPY W/ POLYPECTOMY  06/26/2016   CORONARY STENT PLACEMENT     x 2   EP IMPLANTABLE DEVICE N/A 03/09/2015   Procedure: Loop Recorder  Removal;  Surgeon: Hillis Range, MD;  Location: [facility] INVASIVE CV LAB;  Service: Cardiovascular;  Laterality: N/A;   ESOPHAGOGASTRODUODENOSCOPY N/A 07/31/2016   Procedure: ESOPHAGOGASTRODUODENOSCOPY (EGD) also have pediatric scope available;  Surgeon: Ruffin Frederick, MD;  Location: [facility] ENDOSCOPY;  Service: Gastroenterology;  Laterality: N/A;   ESOPHAGOGASTRODUODENOSCOPY N/A 08/27/2016   Procedure: ESOPHAGOGASTRODUODENOSCOPY (EGD);  Surgeon: Ruffin Frederick, MD;  Location: [facility] ENDOSCOPY;  Service: Gastroenterology;  Laterality: N/A;   implantable loop recorder implantation  08/13/2010   MDT Reveal XT implanted by Dr Dorris Fetch for afib management post ablation   LEFT HEART CATH N/A 07/15/2013   Procedure: LEFT HEART CATH;  Surgeon: Robynn Pane, MD;  Location: [facility] CATH LAB;  Service: Cardiovascular;  Laterality: N/A;   loop recorder removed  2017   RADIOACTIVE SEED IMPLANT N/A 11/27/2017   Procedure: RADIOACTIVE SEED IMPLANT/BRACHYTHERAPY IMPLANT;  Surgeon: Ihor Gully, MD;  Location: [facility];  Service: Urology;  Laterality: N/A;   SPACE OAR INSTILLATION N/A 11/27/2017   Procedure: SPACE OAR INSTILLATION;  Surgeon: Ihor Gully, MD;  Location: [facility];  Service: Urology;  Laterality: N/A;   TONSILLECTOMY  age 52    Allergies  Allergen Reactions   Clopidogrel Other (See Comments)    unknown reaction   Crestor [Rosuvastatin Calcium] Other (See Comments)    Extreme muscular weakness   Lipitor [Atorvastatin Calcium] Other (See Comments)    Extreme muscular weakness    Statins Other (See Comments)    Extreme muscular weakness     Current Outpatient Medications  Medication Sig Dispense Refill   acetaminophen (TYLENOL) 325 MG tablet Take 325 mg by mouth every 6 (six) hours as needed (FOR PAIN.).     apixaban (ELIQUIS) 5 MG TABS tablet Take 1 tablet (5 mg total) by mouth 2 (two) times daily. 180 tablet 3   Cholecalciferol 100 MCG (4000  UT) CAPS Take 4,000 Units by mouth daily.     dextromethorphan-guaiFENesin (MUCINEX DM) 30-600 MG 12hr tablet Take 1 tablet by mouth 2 (two) times daily as needed for cough.     losartan (COZAAR) 50 MG tablet Take 1 tablet (50 mg total) by mouth daily. 90 tablet 3   REPATHA SURECLICK 140 MG/ML SOAJ INJECT 140MG  UNDER THE SKIN EVERY 2 WEEKS 6 mL 3   sildenafil (REVATIO) 20 MG tablet TAKE 1-5 TABLETS (20-100 MG TOTAL) BY MOUTH AS NEEDED. 75 tablet 1   No current facility-administered medications for this visit.  Family History  Problem Relation Age of Onset   Dementia Mother    Colon cancer Mother    Prostate cancer Mother    Heart disease Father    Hyperlipidemia Father    Pancreatic cancer Sister    Colon cancer Maternal Grandmother    Esophageal cancer Neg Hx    Stomach cancer Neg Hx     Social History   Socioeconomic History   Marital status: Married    Spouse name: Not on file   Number of children: 1   Years of education: Not on file   Highest education level: Not on file  Occupational History   Occupation: retired  Tobacco Use   Smoking status: Former    Packs/day: 0.25    Years: 30.00    Additional pack years: 0.00    Total pack years: 7.50    Types: Cigarettes    Quit date: 07/21/2003    Years since quitting: 18.8   Smokeless tobacco: Never  Vaping Use   Vaping Use: Never used  Substance and Sexual Activity   Alcohol use: Yes    Alcohol/week: 1.0 standard drink of alcohol    Types: 1 Glasses of wine per week    Comment: occasional wine or beer   Drug use: No    Comment: marijuana use in past several yrs ago   Sexual activity: Yes  Other Topics Concern   Not on file  Social History Narrative   Pt lives in [place] with wife.  Retired from department of transportation.  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  One son.    Social Determinants of Health   Financial Resource Strain: Not on file  Food Insecurity: Not on file  Transportation  Needs: Not on file  Physical Activity: Not on file  Stress: Not on file  Social Connections: Not on file  Intimate Partner Violence: Not on file     REVIEW OF SYSTEMS:   [X]  denotes positive finding, [ ]  denotes negative finding Cardiac  Comments:  Chest pain or chest pressure:    Shortness of breath upon exertion:    Short of breath when lying flat:    Irregular heart rhythm:        Vascular    Pain in calf, thigh, or hip brought on by ambulation:    Pain in feet at night that wakes you up from your sleep:     Blood clot in your veins:    Leg swelling:         Pulmonary    Oxygen at home:    Productive cough:     Wheezing:         Neurologic    Sudden weakness in arms or legs:     Sudden numbness in arms or legs:     Sudden onset of difficulty speaking or slurred speech:    Temporary loss of vision in one eye:     Problems with dizziness:         Gastrointestinal    Blood in stool:     Vomited blood:         Genitourinary    Burning when urinating:     Blood in urine:        Psychiatric    Major depression:         Hematologic    Bleeding problems:    Problems with blood clotting too easily:        Skin  Rashes or ulcers:        Constitutional    Fever or chills:      PHYSICAL EXAMINATION:  Today's Vitals   05/23/22 1116  BP: (!) 151/71  Pulse: (!) 40  Resp: 16  Temp: (!) 97.4 F (36.3 C)  TempSrc: Temporal  SpO2: 97%  Weight: 159 lb (72.1 kg)  Height: 5\' 10"  (1.778 m)   Body mass index is 22.81 kg/m.   General:  WDWN in NAD; vital signs documented above Gait: Not observed HENT: WNL, normocephalic Pulmonary: normal non-labored breathing , without wheezing Cardiac: regular HR, without carotid bruits Abdomen: soft, NT; aortic pulse is  palpable Skin: without rashes Vascular Exam/Pulses:  Right Left  Radial 1+ (weak) 2+ (normal)  Femoral 2+ (normal) 2+ (normal)  Popliteal Unable to palpate Unable to palpate  DP 2+ (normal)  Biphasic doppler  PT Biphasic doppler Biphasic doppler   Extremities: without ischemic changes, without Gangrene , without cellulitis; without open wounds Musculoskeletal: no muscle wasting or atrophy  Neurologic: A&O X 3 Psychiatric:  The pt has Normal affect.   Non-Invasive Vascular Imaging:   Arterial duplex and AAA on 05/23/2022: Abdominal Aorta Findings:  +-----------+-------+----------+----------+--------+--------+--------+  Location  AP (cm)Trans (cm)PSV (cm/s)WaveformThrombusComments  +-----------+-------+----------+----------+--------+--------+--------+  Proximal  2.56   2.41      50                                  +-----------+-------+----------+----------+--------+--------+--------+  Mid       4.70   4.74      17                                  +-----------+-------+----------+----------+--------+--------+--------+  Distal    2.88   2.85      28                                  +-----------+-------+----------+----------+--------+--------+--------+  RT CIA Prox1.5    1.5       89                                  +-----------+-------+----------+----------+--------+--------+--------+  LT CIA Prox1.5    1.4       79                                  +-----------+-------+----------+----------+--------+--------+--------+     Previous arterial duplex on 10/25/2021: AAA 4.5cm (increase from previous exam)    ASSESSMENT/PLAN:: 77 y.o. male here for follow up for AAA   AAA -AAA has enlarged from 4.55 to 4.74cm today.  He has not had any abdominal or back pain.   Discussed that this would be repaired if >5.5cm and could do stent graft of open repair but would need CTA to determine the best repair if it is ever needed.  -his BP was a little elevated today but was okay at the cardiology office yesterday.  I have asked him to check it twice daily and record it and take to his PCP or cardiologist to see if his medications need adjusting.   -we did get BLE duplex and this was negative for popliteal aneurysms. -discussed with pt  that if this were to rupture he would develop severe back or abdominal pain and if this happened, he should call 911.  -continue Repatha -pt will f/u in 6 months with Dr. Myra Gianotti with AAA duplex and renal artery duplex.  He will call sooner if there are any issues before then.     Doreatha Massed, [facility] [phone]  Clinic MD:   Karin Lieu

## 2022-06-03 ENCOUNTER — Other Ambulatory Visit: Payer: Self-pay

## 2022-06-03 DIAGNOSIS — I714 Abdominal aortic aneurysm, without rupture, unspecified: Secondary | ICD-10-CM

## 2022-06-03 DIAGNOSIS — I701 Atherosclerosis of renal artery: Secondary | ICD-10-CM

## 2022-06-22 DIAGNOSIS — E785 Hyperlipidemia, unspecified: Secondary | ICD-10-CM | POA: Diagnosis not present

## 2022-06-22 DIAGNOSIS — D6869 Other thrombophilia: Secondary | ICD-10-CM | POA: Diagnosis not present

## 2022-06-22 DIAGNOSIS — M1990 Unspecified osteoarthritis, unspecified site: Secondary | ICD-10-CM | POA: Diagnosis not present

## 2022-06-22 DIAGNOSIS — I4891 Unspecified atrial fibrillation: Secondary | ICD-10-CM | POA: Diagnosis not present

## 2022-06-22 DIAGNOSIS — Z888 Allergy status to other drugs, medicaments and biological substances status: Secondary | ICD-10-CM | POA: Diagnosis not present

## 2022-06-22 DIAGNOSIS — I129 Hypertensive chronic kidney disease with stage 1 through stage 4 chronic kidney disease, or unspecified chronic kidney disease: Secondary | ICD-10-CM | POA: Diagnosis not present

## 2022-06-22 DIAGNOSIS — Z7901 Long term (current) use of anticoagulants: Secondary | ICD-10-CM | POA: Diagnosis not present

## 2022-06-22 DIAGNOSIS — N182 Chronic kidney disease, stage 2 (mild): Secondary | ICD-10-CM | POA: Diagnosis not present

## 2022-06-24 DIAGNOSIS — D2261 Melanocytic nevi of right upper limb, including shoulder: Secondary | ICD-10-CM | POA: Diagnosis not present

## 2022-06-24 DIAGNOSIS — Z85828 Personal history of other malignant neoplasm of skin: Secondary | ICD-10-CM | POA: Diagnosis not present

## 2022-06-24 DIAGNOSIS — L2082 Flexural eczema: Secondary | ICD-10-CM | POA: Diagnosis not present

## 2022-06-24 DIAGNOSIS — L821 Other seborrheic keratosis: Secondary | ICD-10-CM | POA: Diagnosis not present

## 2022-06-24 DIAGNOSIS — L579 Skin changes due to chronic exposure to nonionizing radiation, unspecified: Secondary | ICD-10-CM | POA: Diagnosis not present

## 2022-06-24 DIAGNOSIS — L814 Other melanin hyperpigmentation: Secondary | ICD-10-CM | POA: Diagnosis not present

## 2022-06-24 DIAGNOSIS — L570 Actinic keratosis: Secondary | ICD-10-CM | POA: Diagnosis not present

## 2022-07-21 ENCOUNTER — Other Ambulatory Visit: Payer: Self-pay | Admitting: Family Medicine

## 2022-07-21 DIAGNOSIS — I480 Paroxysmal atrial fibrillation: Secondary | ICD-10-CM

## 2022-07-30 ENCOUNTER — Ambulatory Visit: Payer: Medicare PPO | Attending: General Practice

## 2022-07-30 DIAGNOSIS — Z01818 Encounter for other preprocedural examination: Secondary | ICD-10-CM

## 2022-07-31 LAB — CBC
Hematocrit: 40.9 % (ref 37.5–51.0)
Hemoglobin: 13.8 g/dL (ref 13.0–17.7)
MCH: 31.5 pg (ref 26.6–33.0)
MCHC: 33.7 g/dL (ref 31.5–35.7)
MCV: 93 fL (ref 79–97)
Platelets: 261 10*3/uL (ref 150–450)
RBC: 4.38 x10E6/uL (ref 4.14–5.80)
RDW: 13.6 % (ref 11.6–15.4)
WBC: 8.9 10*3/uL (ref 3.4–10.8)

## 2022-07-31 LAB — BASIC METABOLIC PANEL
BUN/Creatinine Ratio: 19 (ref 10–24)
BUN: 21 mg/dL (ref 8–27)
CO2: 22 mmol/L (ref 20–29)
Calcium: 9.7 mg/dL (ref 8.6–10.2)
Chloride: 105 mmol/L (ref 96–106)
Creatinine, Ser: 1.13 mg/dL (ref 0.76–1.27)
Glucose: 112 mg/dL — ABNORMAL HIGH (ref 70–99)
Potassium: 4.6 mmol/L (ref 3.5–5.2)
Sodium: 139 mmol/L (ref 134–144)
eGFR: 67 mL/min/{1.73_m2} (ref >59)

## 2022-08-06 ENCOUNTER — Telehealth (HOSPITAL_COMMUNITY): Payer: Self-pay | Admitting: *Deleted

## 2022-08-06 NOTE — Telephone Encounter (Signed)
Reaching out to patient to offer assistance regarding upcoming cardiac imaging study; pt verbalizes understanding of appt date/time, parking situation and where to check in, pre-test NPO status and verified current allergies; name and call back number provided for further questions should they arise  Larey Brick RN Navigator Cardiac Imaging [facility] [phone] office [phone] cell  Patient aware to arrive at 1PM.

## 2022-08-07 ENCOUNTER — Ambulatory Visit (HOSPITAL_COMMUNITY)
Admission: RE | Admit: 2022-08-07 | Discharge: 2022-08-07 | Disposition: A | Discharge status: Home / Self Care | Payer: Medicare PPO | Source: Ambulatory Visit | Attending: Cardiovascular Disease | Admitting: Cardiovascular Disease

## 2022-08-07 DIAGNOSIS — Z01818 Encounter for other preprocedural examination: Secondary | ICD-10-CM | POA: Insufficient documentation

## 2022-08-07 DIAGNOSIS — I480 Paroxysmal atrial fibrillation: Secondary | ICD-10-CM | POA: Diagnosis not present

## 2022-08-07 MED ORDER — IOHEXOL 350 MG/ML SOLN
95.0000 mL | Freq: Once | INTRAVENOUS | Status: AC | PRN
  Administered 2022-08-07: 95 mL via INTRAVENOUS

## 2022-08-13 NOTE — Pre-Procedure Instructions (Signed)
Instructed patient on the following items: Arrival time 0830 Nothing to eat or drink after midnight No meds AM of procedure Responsible person to drive you home and stay with you for 24 hrs  Have you missed any doses of anti-coagulant Eliquis- takes twice a day, hasn't missed any doses.  Don't take in the morning.

## 2022-08-14 ENCOUNTER — Ambulatory Visit (HOSPITAL_COMMUNITY)
Admission: RE | Admit: 2022-08-14 | Discharge: 2022-08-14 | Disposition: A | Discharge status: Home / Self Care | Payer: Medicare PPO | Attending: Cardiovascular Disease | Admitting: Cardiovascular Disease

## 2022-08-14 ENCOUNTER — Other Ambulatory Visit (HOSPITAL_COMMUNITY): Payer: Self-pay

## 2022-08-14 ENCOUNTER — Ambulatory Visit (HOSPITAL_BASED_OUTPATIENT_CLINIC_OR_DEPARTMENT_OTHER): Payer: Medicare PPO | Admitting: Anesthesiology

## 2022-08-14 ENCOUNTER — Ambulatory Visit (HOSPITAL_COMMUNITY)
Admission: RE | Disposition: A | Discharge status: Home / Self Care | Payer: Self-pay | Source: Home / Self Care | Attending: Cardiovascular Disease

## 2022-08-14 ENCOUNTER — Ambulatory Visit (HOSPITAL_COMMUNITY): Payer: Medicare PPO | Admitting: Anesthesiology

## 2022-08-14 ENCOUNTER — Other Ambulatory Visit: Payer: Self-pay

## 2022-08-14 ENCOUNTER — Other Ambulatory Visit (HOSPITAL_COMMUNITY): Payer: Medicare PPO

## 2022-08-14 DIAGNOSIS — I484 Atypical atrial flutter: Secondary | ICD-10-CM | POA: Insufficient documentation

## 2022-08-14 DIAGNOSIS — J449 Chronic obstructive pulmonary disease, unspecified: Secondary | ICD-10-CM | POA: Diagnosis not present

## 2022-08-14 DIAGNOSIS — Z87891 Personal history of nicotine dependence: Secondary | ICD-10-CM | POA: Diagnosis not present

## 2022-08-14 DIAGNOSIS — I252 Old myocardial infarction: Secondary | ICD-10-CM | POA: Diagnosis not present

## 2022-08-14 DIAGNOSIS — Z7901 Long term (current) use of anticoagulants: Secondary | ICD-10-CM | POA: Diagnosis not present

## 2022-08-14 DIAGNOSIS — I2510 Atherosclerotic heart disease of native coronary artery without angina pectoris: Secondary | ICD-10-CM | POA: Diagnosis not present

## 2022-08-14 DIAGNOSIS — Z955 Presence of coronary angioplasty implant and graft: Secondary | ICD-10-CM | POA: Insufficient documentation

## 2022-08-14 DIAGNOSIS — D6869 Other thrombophilia: Secondary | ICD-10-CM | POA: Insufficient documentation

## 2022-08-14 DIAGNOSIS — I4891 Unspecified atrial fibrillation: Secondary | ICD-10-CM | POA: Diagnosis not present

## 2022-08-14 DIAGNOSIS — I480 Paroxysmal atrial fibrillation: Secondary | ICD-10-CM | POA: Diagnosis not present

## 2022-08-14 DIAGNOSIS — R001 Bradycardia, unspecified: Secondary | ICD-10-CM | POA: Insufficient documentation

## 2022-08-14 DIAGNOSIS — I714 Abdominal aortic aneurysm, without rupture, unspecified: Secondary | ICD-10-CM | POA: Insufficient documentation

## 2022-08-14 DIAGNOSIS — E785 Hyperlipidemia, unspecified: Secondary | ICD-10-CM | POA: Diagnosis not present

## 2022-08-14 HISTORY — PX: ATRIAL FIBRILLATION ABLATION: EP1191

## 2022-08-14 LAB — POCT ACTIVATED CLOTTING TIME: Activated Clotting Time: 372 seconds

## 2022-08-14 SURGERY — ATRIAL FIBRILLATION ABLATION
Anesthesia: General

## 2022-08-14 MED ORDER — HEPARIN SODIUM (PORCINE) 1000 UNIT/ML IJ SOLN
INTRAMUSCULAR | Status: AC
  Filled 2022-08-14: qty 10

## 2022-08-14 MED ORDER — FENTANYL CITRATE (PF) 100 MCG/2ML IJ SOLN
INTRAMUSCULAR | Status: AC
  Filled 2022-08-14: qty 2

## 2022-08-14 MED ORDER — COLCHICINE 0.6 MG PO TABS
0.6000 mg | ORAL_TABLET | Freq: Two times a day (BID) | ORAL | 0 refills | Status: DC
  Filled 2022-08-14: qty 10, 5d supply, fill #0

## 2022-08-14 MED ORDER — ROCURONIUM BROMIDE 10 MG/ML (PF) SYRINGE
PREFILLED_SYRINGE | INTRAVENOUS | Status: DC | PRN
  Administered 2022-08-14: 60 mg via INTRAVENOUS

## 2022-08-14 MED ORDER — PROTAMINE SULFATE 10 MG/ML IV SOLN
INTRAVENOUS | Status: DC | PRN
  Administered 2022-08-14 (×3): 10 mg via INTRAVENOUS
  Administered 2022-08-14: 20 mg via INTRAVENOUS

## 2022-08-14 MED ORDER — PHENYLEPHRINE HCL-NACL 20-0.9 MG/250ML-% IV SOLN
INTRAVENOUS | Status: DC | PRN
  Administered 2022-08-14: 25 ug/min via INTRAVENOUS

## 2022-08-14 MED ORDER — LIDOCAINE 2% (20 MG/ML) 5 ML SYRINGE
INTRAMUSCULAR | Status: DC | PRN
  Administered 2022-08-14: 60 mg via INTRAVENOUS

## 2022-08-14 MED ORDER — HEPARIN (PORCINE) IN NACL 1000-0.9 UT/500ML-% IV SOLN
INTRAVENOUS | Status: DC | PRN
  Administered 2022-08-14 (×4): 500 mL

## 2022-08-14 MED ORDER — PANTOPRAZOLE SODIUM 40 MG PO TBEC
40.0000 mg | DELAYED_RELEASE_TABLET | Freq: Every day | ORAL | 0 refills | Status: DC
  Filled 2022-08-14: qty 45, 45d supply, fill #0

## 2022-08-14 MED ORDER — ONDANSETRON HCL 4 MG/2ML IJ SOLN
INTRAMUSCULAR | Status: DC | PRN
  Administered 2022-08-14: 4 mg via INTRAVENOUS

## 2022-08-14 MED ORDER — ONDANSETRON HCL 4 MG/2ML IJ SOLN: 4.0000 mg | Freq: Four times a day (QID) | INTRAMUSCULAR | Status: DC | PRN

## 2022-08-14 MED ORDER — SODIUM CHLORIDE 0.9 % IV SOLN: INTRAVENOUS | Status: DC

## 2022-08-14 MED ORDER — HEPARIN SODIUM (PORCINE) 1000 UNIT/ML IJ SOLN
INTRAMUSCULAR | Status: DC | PRN
  Administered 2022-08-14: 1000 [IU] via INTRAVENOUS

## 2022-08-14 MED ORDER — SODIUM CHLORIDE 0.9 % IV SOLN: 250.0000 mL | INTRAVENOUS | Status: DC | PRN

## 2022-08-14 MED ORDER — HEPARIN SODIUM (PORCINE) 1000 UNIT/ML IJ SOLN
INTRAMUSCULAR | Status: DC | PRN
  Administered 2022-08-14: 14000 [IU] via INTRAVENOUS

## 2022-08-14 MED ORDER — PROPOFOL 10 MG/ML IV BOLUS
INTRAVENOUS | Status: DC | PRN
  Administered 2022-08-14: 30 mg via INTRAVENOUS
  Administered 2022-08-14: 120 mg via INTRAVENOUS

## 2022-08-14 MED ORDER — SUGAMMADEX SODIUM 200 MG/2ML IV SOLN
INTRAVENOUS | Status: DC | PRN
  Administered 2022-08-14: 10 mg via INTRAVENOUS

## 2022-08-14 MED ORDER — EPHEDRINE SULFATE (PRESSORS) 50 MG/ML IJ SOLN
INTRAMUSCULAR | Status: DC | PRN
  Administered 2022-08-14 (×3): 5 mg via INTRAVENOUS
  Administered 2022-08-14: 2.5 mg via INTRAVENOUS
  Administered 2022-08-14: 5 mg via INTRAVENOUS

## 2022-08-14 MED ORDER — SODIUM CHLORIDE 0.9% FLUSH: 3.0000 mL | INTRAVENOUS | Status: DC | PRN

## 2022-08-14 MED ORDER — ACETAMINOPHEN 325 MG PO TABS: 650.0000 mg | ORAL_TABLET | ORAL | Status: DC | PRN

## 2022-08-14 SURGICAL SUPPLY — 20 items
BLANKET WARM UNDERBOD FULL ACC (MISCELLANEOUS) ×1 IMPLANT
CATH 8FR REPROCESSED SOUNDSTAR (CATHETERS) ×1 IMPLANT
CATH 8FR SOUNDSTAR REPROCESSED (CATHETERS) IMPLANT
CATH ABLAT QDOT MICRO BI TC FJ (CATHETERS) IMPLANT
CATH OCTARAY 2.0 F 3-3-3-3-3 (CATHETERS) IMPLANT
CATH PIGTAIL STEERABLE D1 8.7 (WIRE) IMPLANT
CATH S-M CIRCA TEMP PROBE (CATHETERS) IMPLANT
CATH WEBSTER BI DIR CS D-F CRV (CATHETERS) IMPLANT
CLOSURE PERCLOSE PROSTYLE (VASCULAR PRODUCTS) IMPLANT
COVER SWIFTLINK CONNECTOR (BAG) ×1 IMPLANT
DEVICE CLOSURE MYNXGRIP 6/7F (Vascular Products) IMPLANT
PACK EP LATEX FREE (CUSTOM PROCEDURE TRAY) ×1
PACK EP LF (CUSTOM PROCEDURE TRAY) ×1 IMPLANT
PAD DEFIB RADIO PHYSIO CONN (PAD) ×1 IMPLANT
PATCH CARTO3 (PAD) IMPLANT
SHEATH CARTO VIZIGO MED CURVE (SHEATH) IMPLANT
SHEATH PINNACLE 8F 10CM (SHEATH) IMPLANT
SHEATH PINNACLE 9F 10CM (SHEATH) IMPLANT
SHEATH PROBE COVER 6X72 (BAG) IMPLANT
TUBING SMART ABLATE COOLFLOW (TUBING) IMPLANT

## 2022-08-14 NOTE — Anesthesia Procedure Notes (Signed)
Procedure Name: Intubation Date/Time: 08/14/2022 10:00 AM  Performed by: Margarita Rana, CRNAPre-anesthesia Checklist: Patient identified, Patient being monitored, Timeout performed, Emergency Drugs available and Suction available Patient Re-evaluated:Patient Re-evaluated prior to induction Oxygen Delivery Method: Circle System Utilized Preoxygenation: Pre-oxygenation with 100% oxygen Induction Type: IV induction Ventilation: Mask ventilation without difficulty Laryngoscope Size: Mac and 4 Grade View: Grade I Tube type: Oral Tube size: 7.5 mm Number of attempts: 1 Airway Equipment and Method: Stylet Placement Confirmation: ETT inserted through vocal cords under direct vision, positive ETCO2 and breath sounds checked- equal and bilateral Secured at: 21 cm Tube secured with: Tape Dental Injury: Teeth and Oropharynx as per pre-operative assessment

## 2022-08-14 NOTE — Progress Notes (Signed)
Patient walked to the bathroom with no difficulties, left and right groin sites level 0.

## 2022-08-14 NOTE — Transfer of Care (Signed)
Immediate Anesthesia Transfer of Care Note  Patient: Dustin Bauer  Procedure(s) Performed: ATRIAL FIBRILLATION ABLATION  Patient Location: Cath Lab  Anesthesia Type:General  Level of Consciousness: awake, alert , patient cooperative, and responds to stimulation  Airway & Oxygen Therapy: Patient Spontanous Breathing and Patient connected to nasal cannula oxygen  Post-op Assessment: Report given to RN and Post -op Vital signs reviewed and stable  Post vital signs: Reviewed and stable  Last Vitals:  Vitals Value Taken Time  BP 95/66   Temp    Pulse 57 08/14/22 1205  Resp 20 08/14/22 1205  SpO2 97 % 08/14/22 1205  Vitals shown include unfiled device data.  Last Pain:  Vitals:   08/14/22 0842  TempSrc: Temporal         Complications: No notable events documented.

## 2022-08-14 NOTE — Discharge Instructions (Signed)

## 2022-08-14 NOTE — Anesthesia Preprocedure Evaluation (Addendum)
Anesthesia Evaluation  Patient identified by MRN, date of birth, ID band Patient awake    Reviewed: Allergy & Precautions, H&P , NPO status , Patient's Chart, lab work & pertinent test results  Airway Mallampati: II  TM Distance: >3 FB Neck ROM: Full    Dental no notable dental hx.    Pulmonary COPD, former smoker   Pulmonary exam normal breath sounds clear to auscultation       Cardiovascular + CAD, + Cardiac Stents and + Peripheral Vascular Disease  + dysrhythmias Atrial Fibrillation  Rhythm:Irregular Rate:Bradycardia     Neuro/Psych negative neurological ROS  negative psych ROS   GI/Hepatic negative GI ROS, Neg liver ROS,,,  Endo/Other  negative endocrine ROS    Renal/GU negative Renal ROS  negative genitourinary   Musculoskeletal negative musculoskeletal ROS (+)    Abdominal   Peds negative pediatric ROS (+)  Hematology negative hematology ROS (+)   Anesthesia Other Findings   Reproductive/Obstetrics negative OB ROS                             Anesthesia Physical Anesthesia Plan  ASA: 3  Anesthesia Plan: General   Post-op Pain Management: Minimal or no pain anticipated   Induction: Intravenous  PONV Risk Score and Plan: 2 and Ondansetron, Dexamethasone and Treatment may vary due to age or medical condition  Airway Management Planned: Oral ETT  Additional Equipment:   Intra-op Plan:   Post-operative Plan: Extubation in OR  Informed Consent: I have reviewed the patients History and Physical, chart, labs and discussed the procedure including the risks, benefits and alternatives for the proposed anesthesia with the patient or authorized representative who has indicated his/her understanding and acceptance.     Dental advisory given  Plan Discussed with: CRNA and Surgeon  Anesthesia Plan Comments:        Anesthesia Quick Evaluation

## 2022-08-14 NOTE — Anesthesia Postprocedure Evaluation (Signed)
Anesthesia Post Note  Patient: Dustin Bauer  Procedure(s) Performed: ATRIAL FIBRILLATION ABLATION     Patient location during evaluation: PACU Anesthesia Type: General Level of consciousness: awake and alert Pain management: pain level controlled Vital Signs Assessment: post-procedure vital signs reviewed and stable Respiratory status: spontaneous breathing, nonlabored ventilation, respiratory function stable and patient connected to nasal cannula oxygen Cardiovascular status: blood pressure returned to baseline and stable Postop Assessment: no apparent nausea or vomiting Anesthetic complications: no  No notable events documented.  Last Vitals:  Vitals:   08/14/22 1250 08/14/22 1255  BP: 104/63 101/72  Pulse: (!) 58 (!) 54  Resp: 14 16  Temp:    SpO2: 97% 99%    Last Pain:  Vitals:   08/14/22 1309  TempSrc:   PainSc: 0-No pain                 Brailon Don S

## 2022-08-14 NOTE — H&P (Signed)
Electrophysiology Office Note:    Date:  08/14/2022   ID:  Dustin Bauer, DOB 1945-02-04, MRN [id number]  PCP:  Dettinger, Elige Radon, MD   [facility] Providers Cardiologist:  Rollene Rotunda, MD Electrophysiologist:  Maurice Small, MD     Referring MD: No ref. provider found   History of Present Illness:    Dustin Bauer is a 77 y.o. male with a hx listed below, significant for AAA, CAD status post inferior wall MI and  stent, atrial fibrillation status post ablation in [facility] in 2012, referred for arrhythmia management. He had a loop recorder, explanted in 2017.  He had recurrence of show fibrillation in March.  He was seen in clinic on March 15 reportedly in atrial flutter, but the EKG was not scanned into epic unfortunately.  The patient does have an Apple watch and has taken multiple tracings that have shown atrial fibrillation.  He is feeling well today, no palpitations shortness of breath, presyncope, syncope.  Both his Apple Watch and the EKG today showed normal sinus rhythm.   I reviewed the patient's CT and labs. There was no LAA thrombus. he  has not missed any doses of anticoagulation, and he took his dose last night. There have been no changes in the patient's diagnoses, medications, or condition since our recent clinic visit. PMH, medications reviewed in Epic.  Pt has sinus bradycardia; will obtain ECG this AM.  EKGs/Labs/Other Studies Reviewed Today:    Echocardiogram:   Stess test: 10/2021   Findings are consistent with prior myocardial infarction. The study is intermediate risk.   No ST deviation was noted.   Left ventricular function is normal. End diastolic cavity size is normal.   Prior study available for comparison from 08/24/2018.    Monitors:    Advanced imaging:   EKG:  Last EKG results: today Sinus bradycardia with 1st degree AV block   Recent Labs: 01/03/2022: ALT 15 03/28/2022: TSH 2.250 07/30/2022: BUN 21; Creatinine, Ser  1.13; Hemoglobin 13.8; Platelets 261; Potassium 4.6; Sodium 139     Physical Exam:    VS:  BP 121/75   Pulse (!) 40   Temp (!) 97.3 F (36.3 C) (Temporal)   Resp 18   Ht 5\' 10"  (1.778 m)   Wt 72.6 kg   SpO2 99%   BMI 22.96 kg/m     Wt Readings from Last 3 Encounters:  08/14/22 72.6 kg  05/23/22 72.1 kg  05/22/22 72.5 kg     GEN:  Well nourished, well developed in no acute distress CARDIAC: Regular rhythm, bradycardic no murmurs, rubs, gallops RESPIRATORY:  Normal work of breathing MUSCULOSKELETAL: no edema    ASSESSMENT & PLAN:    Atrial fibrillation - s/p ablation at [facility] in 2012, with symptomatic recurrence 03/2022 - I recommended EP study to assess the prior PVI   We discussed the indication, rationale, logistics, anticipated benefits, and potential risks of the ablation procedure including but not limited to -- bleed at the groin access site, chest pain, damage to nearby organs such as the diaphragm, lungs, or esophagus, need for a drainage tube, or prolonged hospitalization. I explained that the risk for stroke, heart attack, need for open chest surgery, or even death is very low but not zero. he  expressed understanding and wishes to proceed.   Atrial flutter? - observed in clinic 3/15, but not documented -We will see if flutter is inducible during EP study and pursue whatever flutter is induced --  he has risk of atypical flutter with prior PVI. -If flutter is noninducible, we will plan for empiric flutter line  Secondary hypercoagulable state - continue apixaban 5mg   Sinus bradycardia        Medication Adjustments/Labs and Tests Ordered: Current medicines are reviewed at length with the patient today.  Concerns regarding medicines are outlined above.  Orders Placed This Encounter  Procedures   Informed Consent Details: Physician/Practitioner Attestation; Transcribe to consent form and obtain patient signature   Initiate Pre-op Protocol   Void on  call to EP Lab   Confirm CBC and BMP (or CMP) results within 7 days for inpatient and 30 days for outpatient:   Clip right and left femoral area PM before surgery   Clip right internal jugular area PM before surgery   Pre-admission testing diagnosis   EP STUDY   EKG 12-Lead   Insert peripheral IV   Meds ordered this encounter  Medications   0.9 %  sodium chloride infusion     Signed, Maurice Small, MD  08/14/2022 9:01 AM    [facility]

## 2022-08-15 ENCOUNTER — Encounter (HOSPITAL_COMMUNITY): Payer: Self-pay | Admitting: Cardiovascular Disease

## 2022-08-15 MED FILL — Fentanyl Citrate Preservative Free (PF) Inj 100 MCG/2ML: INTRAMUSCULAR | Qty: 2 | Status: AC

## 2022-08-25 ENCOUNTER — Telehealth: Payer: Self-pay | Admitting: Cardiovascular Disease

## 2022-08-25 NOTE — Telephone Encounter (Addendum)
Patient c/o Palpitations:  STAT if patient reporting lightheadedness, shortness of breath, or chest pain  How long have you had palpitations/irregular HR/ Afib? Since Friday.  Are you having the symptoms now? Yes   Are you currently experiencing lightheadedness, SOB or CP? Patient states only when he exerts himself. ts himself.   Do you have a history of afib (atrial fibrillation) or irregular heart rhythm? yes  Have you checked your BP or HR? (document readings if available): He states his apple watch states is too low to record it.    Are you experiencing any other symptoms? Patient states he is having no other symptoms.    Patient  had an ablation on 08/14/22.

## 2022-08-25 NOTE — Telephone Encounter (Signed)
Patient states his apple watch is showing he is in afib and sometimes his HR is too low to read. He is SOB with activity but not at rest.   Advised to check his pulse manually for an accurate reading. Provided education on post ablation afib reoccurrence and when to see emergency care. Patient will continue to monitor and let us know if he becomes more symptomatic.

## 2022-08-27 ENCOUNTER — Other Ambulatory Visit (HOSPITAL_COMMUNITY): Payer: Self-pay | Admitting: Internal Medicine

## 2022-08-27 ENCOUNTER — Ambulatory Visit (HOSPITAL_COMMUNITY)
Admission: RE | Admit: 2022-08-27 | Discharge: 2022-08-27 | Disposition: A | Discharge status: Home / Self Care | Payer: Medicare PPO | Source: Ambulatory Visit | Attending: Internal Medicine | Admitting: Internal Medicine

## 2022-08-27 ENCOUNTER — Encounter (HOSPITAL_COMMUNITY): Payer: Self-pay | Admitting: Internal Medicine

## 2022-08-27 VITALS — BP 114/68 | HR 43 | Ht 70.0 in | Wt 160.8 lb

## 2022-08-27 DIAGNOSIS — I480 Paroxysmal atrial fibrillation: Secondary | ICD-10-CM | POA: Diagnosis not present

## 2022-08-27 DIAGNOSIS — I10 Essential (primary) hypertension: Secondary | ICD-10-CM | POA: Diagnosis not present

## 2022-08-27 DIAGNOSIS — I2510 Atherosclerotic heart disease of native coronary artery without angina pectoris: Secondary | ICD-10-CM | POA: Diagnosis not present

## 2022-08-27 DIAGNOSIS — R0602 Shortness of breath: Secondary | ICD-10-CM | POA: Insufficient documentation

## 2022-08-27 DIAGNOSIS — I252 Old myocardial infarction: Secondary | ICD-10-CM | POA: Diagnosis not present

## 2022-08-27 DIAGNOSIS — Z7901 Long term (current) use of anticoagulants: Secondary | ICD-10-CM | POA: Diagnosis not present

## 2022-08-27 DIAGNOSIS — H40013 Open angle with borderline findings, low risk, bilateral: Secondary | ICD-10-CM | POA: Diagnosis not present

## 2022-08-27 DIAGNOSIS — H04123 Dry eye syndrome of bilateral lacrimal glands: Secondary | ICD-10-CM | POA: Diagnosis not present

## 2022-08-27 DIAGNOSIS — E785 Hyperlipidemia, unspecified: Secondary | ICD-10-CM | POA: Insufficient documentation

## 2022-08-27 DIAGNOSIS — H02882 Meibomian gland dysfunction right lower eyelid: Secondary | ICD-10-CM | POA: Diagnosis not present

## 2022-08-27 DIAGNOSIS — H02885 Meibomian gland dysfunction left lower eyelid: Secondary | ICD-10-CM | POA: Diagnosis not present

## 2022-08-27 DIAGNOSIS — D6869 Other thrombophilia: Secondary | ICD-10-CM | POA: Insufficient documentation

## 2022-08-27 DIAGNOSIS — H43813 Vitreous degeneration, bilateral: Secondary | ICD-10-CM | POA: Diagnosis not present

## 2022-08-27 DIAGNOSIS — Z955 Presence of coronary angioplasty implant and graft: Secondary | ICD-10-CM | POA: Insufficient documentation

## 2022-08-27 DIAGNOSIS — H2513 Age-related nuclear cataract, bilateral: Secondary | ICD-10-CM | POA: Diagnosis not present

## 2022-08-27 DIAGNOSIS — I714 Abdominal aortic aneurysm, without rupture, unspecified: Secondary | ICD-10-CM | POA: Diagnosis not present

## 2022-08-27 DIAGNOSIS — H02831 Dermatochalasis of right upper eyelid: Secondary | ICD-10-CM | POA: Diagnosis not present

## 2022-08-27 DIAGNOSIS — H02834 Dermatochalasis of left upper eyelid: Secondary | ICD-10-CM | POA: Diagnosis not present

## 2022-08-27 LAB — BASIC METABOLIC PANEL
Anion gap: 8 (ref 5–15)
BUN: 18 mg/dL (ref 8–23)
CO2: 25 mmol/L (ref 22–32)
Calcium: 9.4 mg/dL (ref 8.9–10.3)
Chloride: 106 mmol/L (ref 98–111)
Creatinine, Ser: 1.16 mg/dL (ref 0.61–1.24)
GFR, Estimated: >60 mL/min (ref >60)
Glucose, Bld: 101 mg/dL — ABNORMAL HIGH (ref 70–99)
Potassium: 4.5 mmol/L (ref 3.5–5.1)
Sodium: 139 mmol/L (ref 135–145)

## 2022-08-27 LAB — CBC
HCT: 42.9 % (ref 39.0–52.0)
Hemoglobin: 14 g/dL (ref 13.0–17.0)
MCH: 31.4 pg (ref 26.0–34.0)
MCHC: 32.6 g/dL (ref 30.0–36.0)
MCV: 96.2 fL (ref 80.0–100.0)
Platelets: 201 10*3/uL (ref 150–400)
RBC: 4.46 MIL/uL (ref 4.22–5.81)
RDW: 14.6 % (ref 11.5–15.5)
WBC: 7.9 10*3/uL (ref 4.0–10.5)
nRBC: 0 % (ref 0.0–0.2)

## 2022-08-27 NOTE — Patient Instructions (Signed)
Cardioversion scheduled for: August. 20th 2024   - Arrive at the [facility] and go to admitting at 10:00 am   - Do not eat or drink anything after midnight the night prior to your procedure.   - Take all your morning medication (except diabetic medications) with a sip of water prior to arrival.  - You will not be able to drive home after your procedure.    - Do NOT miss any doses of your blood thinner - if you should miss a dose please notify our office immediately.   - If you feel as if you go back into normal rhythm prior to scheduled cardioversion, please notify our office immediately.   If your procedure is canceled in the cardioversion suite you will be charged a cancellation fee.

## 2022-08-27 NOTE — Progress Notes (Addendum)
Primary Care Physician: Dettinger, Elige Radon, MD Primary Cardiologist: Rollene Rotunda, MD Electrophysiologist: Maurice Small, MD     Referring Physician: Dr. Rosaland Lao is a 77 y.o. male with a history of CAD s/p inferior wall MI and stent, HTN, abdominal aortic aneurysm, chronic ischemic heart disease, pyloric stricture, HLD, and paroxysmal atrial fibrillation who presents for consultation in the [facility]rial Fibr[facility]. History of ablation at [facility] in 2012. He is now s/p Afib ablation on 08/14/22 by Dr. Nelly Laurence. Patient contacted office noting to be in Afib since 8/9. Patient is on Eliquis 5 mg BID for a CHADS2VASC score of 4.  On evaluation today, he is currently in Afib with slow ventricular response. Patient admits that on 8/9 when he went into Afib he maybe walked for too long outside in the hot weather and overexerted himself. He has been in Afib since then, confirmed with Apple Watch. No missed doses of Eliquis. He does not have CP or trouble swallowing. He has SOB with activity. Leg sites healed without issue.    he has a BMI of Body mass index is 23.07 kg/m.Marland Kitchen Filed Weights   08/27/22 1359  Weight: 72.9 kg    Current Outpatient Medications  Medication Sig Dispense Refill   acetaminophen (TYLENOL) 325 MG tablet Take 325 mg by mouth every 6 (six) hours as needed (FOR PAIN.).     apixaban (ELIQUIS) 5 MG TABS tablet Take 1 tablet (5 mg total) by mouth 2 (two) times daily. (NEEDS TO BE SEEN BEFORE NEXT REFILL) 60 tablet 0   Cholecalciferol (VITAMIN D3) 50 MCG (2000 UT) capsule Take 2,000 Units by mouth 2 (two) times a week.     clotrimazole-betamethasone (LOTRISONE) cream Apply 1 Application topically 2 (two) times daily as needed (eczema).     Coenzyme Q10 (COQ-10) 100 MG CAPS Take 100 mg by mouth 2 (two) times a week.     Cyanocobalamin (B-12-SL) 1000 MCG SUBL Place 1,000 mcg under the tongue 3 (three) times a week.     losartan (COZAAR) 50 MG  tablet Take 1 tablet (50 mg total) by mouth daily. 90 tablet 3   Magnesium 400 MG TABS Take 400 mg by mouth 3 (three) times a week.     REPATHA SURECLICK 140 MG/ML SOAJ INJECT 140MG  UNDER THE SKIN EVERY 2 WEEKS 6 mL 3   sildenafil (REVATIO) 20 MG tablet TAKE 1-5 TABLETS (20-100 MG TOTAL) BY MOUTH AS NEEDED. 75 tablet 1   colchicine 0.6 MG tablet Take 1 tablet (0.6 mg total) by mouth 2 (two) times daily for 5 days. 10 tablet 0   pantoprazole (PROTONIX) 40 MG tablet Take 1 tablet (40 mg total) by mouth daily. (Patient not taking: Reported on 08/27/2022) 45 tablet 0   No current facility-administered medications for this encounter.    Atrial Fibrillation Management history:  Previous antiarrhythmic drugs: None Previous cardioversions:  Previous ablations: 2012, 08/14/22 Anticoagulation history: Eliquis   ROS- All systems are reviewed and negative except as per the HPI above.  Physical Exam: BP 114/68   Pulse (!) 43   Ht 5\' 10"  (1.778 m)   Wt 72.9 kg   BMI 23.07 kg/m   GEN: Well nourished, well developed in no acute distress NECK: No JVD; No carotid bruits CARDIAC: Irregularly irregular bradycardic rate and rhythm, no murmurs, rubs, gallops RESPIRATORY:  Clear to auscultation without rales, wheezing or rhonchi  ABDOMEN: Soft, non-tender, non-distended EXTREMITIES:  No edema;  No deformity   EKG today demonstrates  Vent. rate 43 BPM PR interval * ms QRS duration 78 ms QT/QTcB 462/390 ms P-R-T axes * -22 71 Atrial fibrillation with slow ventricular response Minimal voltage criteria for LVH, may be normal variant ( R in aVL ) Nonspecific ST and T wave abnormality Abnormal ECG When compared with ECG of 14-Aug-2022 12:18, PREVIOUS ECG IS PRESENT  Echo 12/15/2013 demonstrated  Study Conclusions   - Left ventricle: The cavity size was normal. Wall thickness was    increased in a pattern of mild LVH. Systolic function was normal.    The estimated ejection fraction was in the range  of 60% to 65%.    Wall motion was normal; there were no regional wall motion    abnormalities. Doppler parameters are consistent with abnormal    left ventricular relaxation (grade 1 diastolic dysfunction). The    E/e&' ratio is <8, suggesting normal LV filling pressure.  - Mitral valve: Mildly thickened leaflets .  - Left atrium: The atrium was at the upper limits of normal in    size.  - Right atrium: The atrium was mildly dilated.   ASSESSMENT & PLAN CHA2DS2-VASc Score = 4  The patient's score is based upon: CHF History: 0 HTN History: 1 Diabetes History: 0 Stroke History: 0 Vascular Disease History: 1 Age Score: 2 Gender Score: 0       ASSESSMENT AND PLAN: Paroxysmal Atrial Fibrillation (ICD10:  I48.0) / Atrial flutter The patient's CHA2DS2-VASc score is 4, indicating a 4.8% annual risk of stroke.   S/p Afib ablation on 08/14/22 by Dr. Nelly Laurence.  He is currently in Afib with slow ventricular response.  After discussion, patient agrees to proceed with cardioversion. We will schedule first available and have him f/u as already scheduled on 8/29. Will obtain labs today.  Informed Consent   Shared Decision Making/Informed Consent The risks (stroke, cardiac arrhythmias rarely resulting in the need for a temporary or permanent pacemaker, skin irritation or burns and complications associated with conscious sedation including aspiration, arrhythmia, respiratory failure and death), benefits (restoration of normal sinus rhythm) and alternatives of a direct current cardioversion were explained in detail to Mr. Kizzie Bane and he agrees to proceed.      Secondary Hypercoagulable State (ICD10:  D68.69) The patient is at significant risk for stroke/thromboembolism based upon his CHA2DS2-VASc Score of 4.  Continue Apixaban (Eliquis).  No missed doses.    Follow up as scheduled on 8/29 after DCCV.    Lake Bells, PA-C  Afib Clinic [facility] [address] [place],  [place] [address] [phone]

## 2022-08-28 ENCOUNTER — Ambulatory Visit (HOSPITAL_COMMUNITY): Payer: Medicare PPO | Admitting: Internal Medicine

## 2022-09-02 ENCOUNTER — Encounter (HOSPITAL_COMMUNITY): Admission: RE | Payer: Self-pay | Source: Home / Self Care

## 2022-09-02 ENCOUNTER — Ambulatory Visit (HOSPITAL_COMMUNITY): Admission: RE | Admit: 2022-09-02 | Payer: Medicare PPO | Source: Home / Self Care | Admitting: Cardiovascular Disease

## 2022-09-02 SURGERY — CARDIOVERSION
Anesthesia: General

## 2022-09-10 ENCOUNTER — Other Ambulatory Visit: Payer: Self-pay | Admitting: Nurse Practitioner

## 2022-09-10 DIAGNOSIS — N529 Male erectile dysfunction, unspecified: Secondary | ICD-10-CM

## 2022-09-10 NOTE — Telephone Encounter (Signed)
Last office visit 01/03/22 Last refill 01/09/22 #75, 1 refill

## 2022-09-11 ENCOUNTER — Ambulatory Visit (HOSPITAL_COMMUNITY)
Admission: RE | Admit: 2022-09-11 | Discharge: 2022-09-11 | Disposition: A | Discharge status: Home / Self Care | Payer: Medicare PPO | Source: Ambulatory Visit | Attending: Internal Medicine | Admitting: Internal Medicine

## 2022-09-11 VITALS — BP 154/90 | HR 47 | Ht 70.0 in | Wt 158.8 lb

## 2022-09-11 DIAGNOSIS — I252 Old myocardial infarction: Secondary | ICD-10-CM | POA: Diagnosis present

## 2022-09-11 DIAGNOSIS — I480 Paroxysmal atrial fibrillation: Secondary | ICD-10-CM | POA: Insufficient documentation

## 2022-09-11 DIAGNOSIS — D6869 Other thrombophilia: Secondary | ICD-10-CM | POA: Insufficient documentation

## 2022-09-11 DIAGNOSIS — R9431 Abnormal electrocardiogram [ECG] [EKG]: Secondary | ICD-10-CM | POA: Diagnosis not present

## 2022-09-11 DIAGNOSIS — I10 Essential (primary) hypertension: Secondary | ICD-10-CM | POA: Diagnosis not present

## 2022-09-11 NOTE — Progress Notes (Addendum)
Primary Care Physician: Dettinger, Elige Radon, MD Primary Cardiologist: Rollene Rotunda, MD Electrophysiologist: Maurice Small, MD     Referring Physician: Dr. Rosaland Lao is a 77 y.o. male with a history of CAD s/p inferior wall MI and stent, HTN, abdominal aortic aneurysm, chronic ischemic heart disease, pyloric stricture, HLD, and paroxysmal atrial fibrillation who presents for consultation in the [facility]. History of ablation at [facility] in 2012. He is now s/p Afib ablation on 08/14/22 by Dr. Nelly Laurence. Patient contacted office noting to be in Afib since 8/9. Patient is on Eliquis 5 mg BID for a CHADS2VASC score of 4.  On evaluation today, he is currently in Afib with slow ventricular response. Patient admits that on 8/9 when he went into Afib he maybe walked for too long outside in the hot weather and overexerted himself. He has been in Afib since then, confirmed with Apple Watch. No missed doses of Eliquis. He does not have CP or trouble swallowing. He has SOB with activity. Leg sites healed without issue.   On follow up 09/11/22, he is currently in NSR. He was scheduled for cardioversion on 8/20 after last OV but procedure was canceled due to him being in normal rhythm. Since then, he notes to have episodes of Afib after he goes on a hike with his dogs. He was in Afib this past week until today when he converted to NSR. He wears an Apple watch for monitoring. He notes to have an overall lower HR ever since his MI. No missed doses of Eliquis.  he has a BMI of Body mass index is 22.79 kg/m.Marland Kitchen Filed Weights   09/11/22 1108  Weight: 72 kg     Current Outpatient Medications  Medication Sig Dispense Refill   acetaminophen (TYLENOL) 325 MG tablet Take 325 mg by mouth as needed (FOR PAIN.).     apixaban (ELIQUIS) 5 MG TABS tablet Take 1 tablet (5 mg total) by mouth 2 (two) times daily. (NEEDS TO BE SEEN BEFORE NEXT REFILL) 60 tablet 0   Cholecalciferol  (VITAMIN D3) 50 MCG (2000 UT) capsule Take 2,000 Units by mouth 2 (two) times a week.     clotrimazole-betamethasone (LOTRISONE) cream Apply 1 Application topically 2 (two) times daily as needed (eczema).     Coenzyme Q10 (COQ-10) 100 MG CAPS Take 100 mg by mouth 2 (two) times a week.     Cyanocobalamin (B-12-SL) 1000 MCG SUBL Place 1,000 mcg under the tongue 3 (three) times a week.     losartan (COZAAR) 50 MG tablet Take 1 tablet (50 mg total) by mouth daily. 90 tablet 3   Magnesium 400 MG TABS Take 400 mg by mouth 3 (three) times a week.     pantoprazole (PROTONIX) 40 MG tablet Take 1 tablet (40 mg total) by mouth daily. 45 tablet 0   REPATHA SURECLICK 140 MG/ML SOAJ INJECT 140MG  UNDER THE SKIN EVERY 2 WEEKS 6 mL 3   sildenafil (REVATIO) 20 MG tablet TAKE 1-5 TABLETS (20-100 MG TOTAL) BY MOUTH AS NEEDED. 75 tablet 1   No current facility-administered medications for this encounter.    Atrial Fibrillation Management history:  Previous antiarrhythmic drugs: None Previous cardioversions:  Previous ablations: 2012, 08/14/22 Anticoagulation history: Eliquis   ROS- All systems are reviewed and negative except as per the HPI above.  Physical Exam: Ht 5\' 10"  (1.778 m)   Wt 72 kg   BMI 22.79 kg/m   GEN-  The patient is well appearing, alert and oriented x 3 today.   Neck - no JVD or carotid bruit noted Lungs- Clear to ausculation bilaterally, normal work of breathing Heart- Regular bradycardic rate and rhythm, no murmurs, rubs or gallops, PMI not laterally displaced Extremities- no clubbing, cyanosis, or edema Skin - no rash or ecchymosis noted  EKG today demonstrates  Vent. rate 47 BPM PR interval 244 ms QRS duration 78 ms QT/QTcB 470/415 ms P-R-T axes -5 -28 84 Sinus bradycardia with 1st degree A-V block with Premature atrial complexes Left ventricular hypertrophy with repolarization abnormality ( R in aVL ) Abnormal ECG When compared with ECG of 27-Aug-2022 14:05, PREVIOUS ECG  IS PRESENT  Echo 12/15/2013 demonstrated  Study Conclusions   - Left ventricle: The cavity size was normal. Wall thickness was    increased in a pattern of mild LVH. Systolic function was normal.    The estimated ejection fraction was in the range of 60% to 65%.    Wall motion was normal; there were no regional wall motion    abnormalities. Doppler parameters are consistent with abnormal    left ventricular relaxation (grade 1 diastolic dysfunction). The    E/e&' ratio is <8, suggesting normal LV filling pressure.  - Mitral valve: Mildly thickened leaflets .  - Left atrium: The atrium was at the upper limits of normal in    size.  - Right atrium: The atrium was mildly dilated.   ASSESSMENT & PLAN CHA2DS2-VASc Score = 4  The patient's score is based upon: CHF History: 0 HTN History: 1 Diabetes History: 0 Stroke History: 0 Vascular Disease History: 1 Age Score: 2 Gender Score: 0      ASSESSMENT AND PLAN: Paroxysmal Atrial Fibrillation (ICD10:  I48.0) / Atrial flutter The patient's CHA2DS2-VASc score is 4, indicating a 4.8% annual risk of stroke.   S/p Afib ablation on 08/14/22 by Dr. Nelly Laurence.  He is currently in NSR. He is noting paroxysmal Afib that appears to be related to exercise / strenuous activity. I will have a discussion with Dr. Nelly Laurence regarding plan of care for this patient in the post-ablation period. It may be easiest to advise patient to temporarily stop strenuous exercise for now in the post ablation period. He could consider Tikosyn if desired for AAD therapy but may take time to schedule admission. He cannot take flecainide due to CAD and would be hesitant to start Multaq or amiodarone due to bradycardia.   Addendum 09/12/22: After speaking with Dr. Nelly Laurence, will notify patient to stop vigorous hiking in order to maintain normal rhythm during post ablation period.    Qtc 415 ms CrCl estimate 54 mL/min   Secondary Hypercoagulable State (ICD10:  D68.69) The patient is  at significant risk for stroke/thromboembolism based upon his CHA2DS2-VASc Score of 4.  Continue Apixaban (Eliquis).  No missed doses.    Follow up as scheduled with Dr. Nelly Laurence.   Lake Bells, PA-C  Afib Clinic [facility] [address] [place], [place] [address] [phone]

## 2022-09-12 ENCOUNTER — Encounter (HOSPITAL_COMMUNITY): Payer: Self-pay

## 2022-09-12 NOTE — Addendum Note (Signed)
Encounter addended by: Eustace Pen, PA-C on: 09/12/2022 9:32 AM  Actions taken: Clinical Note Signed

## 2022-10-08 DIAGNOSIS — Z5189 Encounter for other specified aftercare: Secondary | ICD-10-CM | POA: Diagnosis not present

## 2022-10-08 DIAGNOSIS — S81812A Laceration without foreign body, left lower leg, initial encounter: Secondary | ICD-10-CM | POA: Diagnosis not present

## 2022-11-16 DIAGNOSIS — I10 Essential (primary) hypertension: Secondary | ICD-10-CM | POA: Insufficient documentation

## 2022-11-16 NOTE — Progress Notes (Unsigned)
Cardiology Office Note:   Date:  11/19/2022  ID:  Dustin Bauer, DOB 1945-02-12, MRN [id number] PCP: Dettinger, Elige Radon, MD  [facility] Providers Cardiologist:  Rollene Rotunda, MD Electrophysiologist:  Maurice Small, MD {  History of Present Illness:   Dustin Bauer is a 77 y.o. male who presents for follow up of atrial fib.  He is s/p ablation at [facility], CAD with PCI to the LAD 2011, inferoposterior STEMI in 2015 requiring DES to occluded RCA (100%), with EF of 50%-55% complicated by CHB which resolved without need for PPM implantation. Repeat LHC in 2017 revealing patent RCA and LAD. HR in the 40's.  He had a small fixed moderate intensity mid anterospetal perfusion defect in 2020.     Since I saw him he has had another A-fib ablation.  First times a week for [facility].  He has had no recurrent arrhythmias since that time.  He is active walking his dog and hiking. The patient denies any new symptoms such as chest discomfort, neck or arm discomfort. There has been no new shortness of breath, PND or orthopnea. There have been no reported palpitations, presyncope or syncope.    ROS: As stated in the HPI and negative for all other systems.   Studies Reviewed:    EKG:   EKG Interpretation Date/Time:  Wednesday November 19 2022 12:51:01 EST Ventricular Rate:  55 PR Interval:  254 QRS Duration:  78 QT Interval:  430 QTC Calculation: 411 R Axis:   85  Text Interpretation: Sinus bradycardia with PACs in a bigeminal pattern.  Non specific ST T wave changes No significant change since last tracing Confirmed by Rollene Rotunda ([id number]) on 11/19/2022 1:15:07 PM    Risk Assessment/Calculations:    CHA2DS2-VASc Score = 4   This indicates a 4.8% annual risk of stroke. The patient's score is based upon: CHF History: 0 HTN History: 1 Diabetes History: 0 Stroke History: 0 Vascular Disease History: 1 Age Score: 2 Gender Score: 0            Physical Exam:   VS:  BP 130/80    Pulse (!) 55   Ht 5\' 10"  (1.778 m)   Wt 159 lb (72.1 kg)   BMI 22.81 kg/m    Wt Readings from Last 3 Encounters:  11/19/22 159 lb (72.1 kg)  09/11/22 158 lb 12.8 oz (72 kg)  08/27/22 160 lb 12.8 oz (72.9 kg)     GEN: Well nourished, well developed in no acute distress NECK: No JVD; No carotid bruits CARDIAC: RRR, no murmurs, rubs, gallops RESPIRATORY:  Clear to auscultation without rales, wheezing or rhonchi  ABDOMEN: Soft, non-tender, non-distended EXTREMITIES:  No edema; No deformity   ASSESSMENT AND PLAN:   Paroxysmal atrial fibrillation:  He is status post ablation.  However, he does not have a problem with his anticoagulation.   This is his second ablation I think he is at risk for recurrent fibrillation and for now I will continue his anticoagulation.    Essential hypertension:      His BP is well controlled.  No change in therapy.    Coronary artery disease:  The patient has no new sypmtoms.  No further cardiovascular testing is indicated.  We will continue with aggressive risk reduction and meds as listed.  Hyperlipidemia:  LDL was 40.  No change in therapy.   Abdominal aortic aneurysm:He has follow-up imaging scheduled and an appointment with Dr. Myra Gianotti.      Follow  up with me in one year.   Signed, Rollene Rotunda, MD

## 2022-11-17 ENCOUNTER — Ambulatory Visit: Payer: Medicare PPO | Admitting: Cardiovascular Disease

## 2022-11-19 ENCOUNTER — Ambulatory Visit: Payer: Medicare PPO | Admitting: Cardiology

## 2022-11-19 ENCOUNTER — Encounter: Payer: Self-pay | Admitting: Cardiology

## 2022-11-19 VITALS — BP 130/80 | HR 55 | Ht 70.0 in | Wt 159.0 lb

## 2022-11-19 DIAGNOSIS — I10 Essential (primary) hypertension: Secondary | ICD-10-CM

## 2022-11-19 DIAGNOSIS — E785 Hyperlipidemia, unspecified: Secondary | ICD-10-CM | POA: Diagnosis not present

## 2022-11-19 DIAGNOSIS — I480 Paroxysmal atrial fibrillation: Secondary | ICD-10-CM | POA: Diagnosis not present

## 2022-11-19 DIAGNOSIS — I714 Abdominal aortic aneurysm, without rupture, unspecified: Secondary | ICD-10-CM

## 2022-11-19 DIAGNOSIS — I2510 Atherosclerotic heart disease of native coronary artery without angina pectoris: Secondary | ICD-10-CM | POA: Diagnosis not present

## 2022-11-19 NOTE — Patient Instructions (Signed)

## 2022-11-24 ENCOUNTER — Ambulatory Visit: Payer: Medicare PPO | Admitting: Surgery

## 2022-11-24 ENCOUNTER — Ambulatory Visit (HOSPITAL_COMMUNITY): Payer: Medicare PPO

## 2022-11-25 ENCOUNTER — Other Ambulatory Visit: Payer: Self-pay | Admitting: Family Medicine

## 2022-11-25 DIAGNOSIS — N529 Male erectile dysfunction, unspecified: Secondary | ICD-10-CM

## 2022-12-09 ENCOUNTER — Ambulatory Visit: Payer: Medicare PPO | Attending: Cardiovascular Disease | Admitting: Cardiovascular Disease

## 2022-12-09 ENCOUNTER — Encounter: Payer: Self-pay | Admitting: Cardiovascular Disease

## 2022-12-09 VITALS — BP 130/74 | HR 46 | Ht 70.0 in | Wt 159.8 lb

## 2022-12-09 DIAGNOSIS — I480 Paroxysmal atrial fibrillation: Secondary | ICD-10-CM | POA: Diagnosis not present

## 2022-12-09 NOTE — Progress Notes (Signed)
Electrophysiology Office Note:    Date:  12/09/2022   ID:  Dustin Bauer, DOB Sep 18, 1945, MRN [id number]  PCP:  Dettinger, Elige Radon, MD   [facility] Providers Cardiologist:  Rollene Rotunda, MD Electrophysiologist:  Maurice Small, MD     Referring MD: Dettinger, Elige Radon, MD   History of Present Illness:    Dustin Bauer is a 77 y.o. male with a hx listed below, significant for AAA, CAD status post inferior wall MI and  stent, atrial fibrillation status post ablation in [facility] in 2012, referred for arrhythmia management. He had a loop recorder, explanted in 2017.  He had recurrence of show fibrillation in March.  He was seen in clinic on March 15 reportedly in atrial flutter, but the EKG was not scanned into epic unfortunately.  The patient does have an Apple watch and has taken multiple tracings that have shown atrial fibrillation.  He is feeling well today, no palpitations shortness of breath, presyncope, syncope.  Both his Apple Watch and the EKG today showed normal sinus rhythm.  He underwent electrophysiology study and ablation of atypical flutter and atrial fibrillation on August 14, 2022.  At the onset of the case he was in a left-sided flutter with a cycle length of 220 ms.  There was a region of reconnection at the posterior superior left vein and left carina which appeared to be providing the substrate for a flutter.  Unfortunately, he converted to atrial fibrillation during mapping, and I was unable to fully define the circuit.  Attempts to cardiovert from fibrillation and reinduce the flutter resulted in reinitiation of atrial fibrillation.  I then reisolated the pulmonary veins as mapping in sinus rhythm demonstrated reconnection along the carina's of both vein sets   EKGs/Labs/Other Studies Reviewed Today:    Echocardiogram:   Stess test: 10/2021   Findings are consistent with prior myocardial infarction. The study is intermediate risk.   No ST deviation  was noted.   Left ventricular function is normal. End diastolic cavity size is normal.   Prior study available for comparison from 08/24/2018.    Monitors:    Advanced imaging:   EKG:  EKG Interpretation Date/Time:  Tuesday December 09 2022 14:01:43 EST Ventricular Rate:  46 PR Interval:    QRS Duration:  76 QT Interval:  482 QTC Calculation: 421 R Axis:   84  Text Interpretation: Sinus bradycardia with occsional ectopic atrial beats Nonspecific ST abnormality When compared with ECG of 19-Nov-2022 12:51, rate is slower Confirmed by York Pellant [id number]) on 12/09/2022 2:36:44 PM     Recent Labs: 01/03/2022: ALT 15 03/28/2022: TSH 2.250 08/27/2022: BUN 18; Creatinine, Ser 1.16; Hemoglobin 14.0; Platelets 201; Potassium 4.5; Sodium 139     Physical Exam:    VS:  BP 130/74 (BP Location: Left Arm, Patient Position: Sitting, Cuff Size: Normal)   Pulse (!) 46   Ht 5\' 10"  (1.778 m)   Wt 159 lb 12.8 oz (72.5 kg)   SpO2 98%   BMI 22.93 kg/m     Wt Readings from Last 3 Encounters:  12/09/22 159 lb 12.8 oz (72.5 kg)  11/19/22 159 lb (72.1 kg)  09/11/22 158 lb 12.8 oz (72 kg)     GEN:  Well nourished, well developed in no acute distress CARDIAC: Regular rhythm, bradycardic no murmurs, rubs, gallops RESPIRATORY:  Normal work of breathing MUSCULOSKELETAL: no edema    ASSESSMENT & PLAN:    Atrial fibrillation - s/p ablation at [facility] in 2012, with symptomatic recurrence 03/2022 - Repeat EP study and ablation by me and August 2024 showed reconnection of the right and left veins -He is monitoring his rhythm with an Apple Watch and has not had any notifications of or symptoms of AF recurrence since a month or 2 after the ablation  Atypical Atrial flutter - atypical left-sided atrial flutter documented during EP study 08/2022 - I suspect the substrate for this was a region of breakthrough of the left side WACA, address with re-isolation of the veins  Secondary  hypercoagulable state - continue apixaban 5mg   Sinus bradycardia -Patient reports this is longstanding -He has been able to achieve heart rates in the 120s and 130s with exertion according to his Apple Watch -I do not see a strong indication for pacemaker at this time        Medication Adjustments/Labs and Tests Ordered: Current medicines are reviewed at length with the patient today.  Concerns regarding medicines are outlined above.  Orders Placed This Encounter  Procedures   EKG 12-Lead   No orders of the defined types were placed in this encounter.    Signed, Maurice Small, MD  12/09/2022 2:35 PM    [facility]

## 2022-12-09 NOTE — Patient Instructions (Signed)
Medication Instructions:  Your physician recommends that you continue on your current medications as directed. Please refer to the Current Medication list given to you today. *If you need a refill on your cardiac medications before your next appointment, please call your pharmacy*   Follow-Up: At [facility], you and your health needs are our priority.  As part of our continuing mission to provide you with exceptional heart care, we have created designated Provider Care Teams.  These Care Teams include your primary Cardiologist (physician) and Advanced Practice Providers (APPs -  Physician Assistants and Nurse Practitioners) who all work together to provide you with the care you need, when you need it.  We recommend signing up for the patient portal called "MyChart".  Sign up information is provided on this After Visit Summary.  MyChart is used to connect with patients for Virtual Visits (Telemedicine).  Patients are able to view lab/test results, encounter notes, upcoming appointments, etc.  Non-urgent messages can be sent to your provider as well.   To learn more about what you can do with MyChart, go to [email or web address].    Your next appointment:   6 month(s)  Provider:   You will follow up in the Atrial Fibrillation Clinic located at [facility]. Your provider will be: Clint R. Fenton, PA-C or Lake Bells, PA-C

## 2022-12-23 DIAGNOSIS — L814 Other melanin hyperpigmentation: Secondary | ICD-10-CM | POA: Diagnosis not present

## 2022-12-23 DIAGNOSIS — L821 Other seborrheic keratosis: Secondary | ICD-10-CM | POA: Diagnosis not present

## 2022-12-23 DIAGNOSIS — Z85828 Personal history of other malignant neoplasm of skin: Secondary | ICD-10-CM | POA: Diagnosis not present

## 2022-12-23 DIAGNOSIS — D225 Melanocytic nevi of trunk: Secondary | ICD-10-CM | POA: Diagnosis not present

## 2022-12-23 DIAGNOSIS — L2082 Flexural eczema: Secondary | ICD-10-CM | POA: Diagnosis not present

## 2022-12-23 DIAGNOSIS — L579 Skin changes due to chronic exposure to nonionizing radiation, unspecified: Secondary | ICD-10-CM | POA: Diagnosis not present

## 2022-12-23 DIAGNOSIS — L570 Actinic keratosis: Secondary | ICD-10-CM | POA: Diagnosis not present

## 2023-01-08 ENCOUNTER — Other Ambulatory Visit: Payer: Self-pay | Admitting: Family Medicine

## 2023-01-08 DIAGNOSIS — I119 Hypertensive heart disease without heart failure: Secondary | ICD-10-CM

## 2023-01-08 NOTE — Addendum Note (Signed)
Addended by: Julious Payer D on: 01/08/2023 11:05 AM   Modules accepted: Orders

## 2023-01-08 NOTE — Telephone Encounter (Signed)
Dettinger NTBS Last OV 01/03/22 NO RF sent to pharmacy last OV greater than a year

## 2023-01-08 NOTE — Telephone Encounter (Signed)
I called pt & made pt an appt w/Dettinger on 02-04-2023 at 2:40pm. He would like 30 days sent into [facility], so he will not be without until appt.

## 2023-01-09 MED ORDER — LOSARTAN POTASSIUM 50 MG PO TABS
50.0000 mg | ORAL_TABLET | Freq: Every day | ORAL | 0 refills | Status: DC
Start: 2023-01-09 — End: 2023-02-04

## 2023-02-04 ENCOUNTER — Ambulatory Visit: Payer: Medicare PPO | Admitting: Family Medicine

## 2023-02-04 ENCOUNTER — Telehealth: Payer: Self-pay | Admitting: Cardiology

## 2023-02-04 ENCOUNTER — Encounter: Payer: Self-pay | Admitting: Family Medicine

## 2023-02-04 VITALS — BP 123/80 | HR 48 | Ht 70.0 in | Wt 159.0 lb

## 2023-02-04 DIAGNOSIS — N529 Male erectile dysfunction, unspecified: Secondary | ICD-10-CM

## 2023-02-04 DIAGNOSIS — E7800 Pure hypercholesterolemia, unspecified: Secondary | ICD-10-CM

## 2023-02-04 DIAGNOSIS — E785 Hyperlipidemia, unspecified: Secondary | ICD-10-CM

## 2023-02-04 DIAGNOSIS — Z23 Encounter for immunization: Secondary | ICD-10-CM

## 2023-02-04 DIAGNOSIS — I25119 Atherosclerotic heart disease of native coronary artery with unspecified angina pectoris: Secondary | ICD-10-CM

## 2023-02-04 DIAGNOSIS — Z789 Other specified health status: Secondary | ICD-10-CM

## 2023-02-04 DIAGNOSIS — I119 Hypertensive heart disease without heart failure: Secondary | ICD-10-CM | POA: Diagnosis not present

## 2023-02-04 DIAGNOSIS — I480 Paroxysmal atrial fibrillation: Secondary | ICD-10-CM

## 2023-02-04 LAB — LIPID PANEL

## 2023-02-04 MED ORDER — SILDENAFIL CITRATE 20 MG PO TABS
20.0000 mg | ORAL_TABLET | ORAL | 2 refills | Status: AC | PRN
Start: 2023-02-04 — End: ?

## 2023-02-04 MED ORDER — REPATHA SURECLICK 140 MG/ML ~~LOC~~ SOAJ: 140.0000 mg | SUBCUTANEOUS | 3 refills | Status: AC

## 2023-02-04 MED ORDER — LOSARTAN POTASSIUM 50 MG PO TABS
50.0000 mg | ORAL_TABLET | Freq: Every day | ORAL | 3 refills | Status: DC
Start: 2023-02-04 — End: 2023-08-05

## 2023-02-04 MED ORDER — APIXABAN 5 MG PO TABS: 5.0000 mg | ORAL_TABLET | Freq: Two times a day (BID) | ORAL | 3 refills | Status: DC

## 2023-02-04 NOTE — Telephone Encounter (Signed)
*  STAT* If patient is at the pharmacy, call can be transferred to refill team.   1. Which medications need to be refilled? (please list nam  REPATHA SURECLICK 140 MG/ML SOAJ  e of each medication and dose if known)   2. Which pharmacy/location (including street and city if local pharmacy) is medication to be sent to? [facility] - [place], [place] - [address] Phone: [phone]  Fax: [phone]     3. Do they need a 30 day or 90 day supply? 90

## 2023-02-04 NOTE — Progress Notes (Signed)
BP 123/80   Pulse (!) 48   Ht 5\' 10"  (1.778 m)   Wt 159 lb (72.1 kg)   BMI 22.81 kg/m    Subjective:   Patient ID: Dustin Bauer, male    DOB: November 05, 1945, 78 y.o.   MRN: [id number]  HPI: Dustin Bauer is a 78 y.o. male presenting on 02/04/2023 for Medical Management of Chronic Issues and Hyperlipidemia   HPI Hyperlipidemia Patient is coming in for recheck of his hyperlipidemia. The patient is currently taking Repatha. They deny any issues with myalgias or history of liver damage from it. They deny any focal numbness or weakness or chest pain.   Hypertension and A-fib recheck and CAD Patient is currently on Eliquis and losartan, and their blood pressure today is 123/80. Patient denies any lightheadedness or dizziness. Patient denies headaches, blurred vision, chest pains, shortness of breath, or weakness. Denies any side effects from medication and is content with current medication.   Erectile dysfunction recheck Patient is coming in for recheck and refill of sildenafil that has been using for erectile dysfunction.  Feels like it does well for him and only uses it occasionally.  Relevant past medical, surgical, family and social history reviewed and updated as indicated. Interim medical history since our last visit reviewed. Allergies and medications reviewed and updated.  Review of Systems  Constitutional:  Negative for chills and fever.  Eyes:  Negative for visual disturbance.  Respiratory:  Negative for shortness of breath and wheezing.   Cardiovascular:  Negative for chest pain and leg swelling.  Musculoskeletal:  Negative for back pain and gait problem.  Skin:  Negative for rash.  Neurological:  Negative for dizziness, weakness and light-headedness.  All other systems reviewed and are negative.   Per HPI unless specifically indicated above   Allergies as of 02/04/2023       Reactions   Clopidogrel Other (See Comments)   unknown reaction   Crestor [rosuvastatin  Calcium] Other (See Comments)   Extreme muscular weakness   Lipitor [atorvastatin Calcium] Other (See Comments)   Extreme muscular weakness   Statins Other (See Comments)   Extreme muscular weakness        Medication List        Accurate as of February 04, 2023  3:20 PM. If you have any questions, ask your nurse or doctor.          STOP taking these medications    pantoprazole 40 MG tablet Commonly known as: Protonix Stopped by: Elige Radon Quintasia Theroux       TAKE these medications    acetaminophen 325 MG tablet Commonly known as: TYLENOL Take 325 mg by mouth as needed (FOR PAIN.).   apixaban 5 MG Tabs tablet Commonly known as: Eliquis Take 1 tablet (5 mg total) by mouth 2 (two) times daily. What changed: additional instructions Changed by: Elige Radon Zamiya Dillard   B-12-SL 1000 MCG Subl Generic drug: Cyanocobalamin Place 1,000 mcg under the tongue 3 (three) times a week.   clotrimazole-betamethasone cream Commonly known as: LOTRISONE Apply 1 Application topically 2 (two) times daily as needed (eczema).   CoQ-10 100 MG Caps Take 100 mg by mouth 2 (two) times a week.   losartan 50 MG tablet Commonly known as: COZAAR Take 1 tablet (50 mg total) by mouth daily.   Magnesium 400 MG Tabs Take 400 mg by mouth 3 (three) times a week.   Repatha SureClick 140 MG/ML Soaj Generic drug: Evolocumab INJECT 140MG  UNDER THE SKIN  EVERY 2 WEEKS   sildenafil 20 MG tablet Commonly known as: REVATIO Take 1-5 tablets (20-100 mg total) by mouth as needed.   Vitamin D3 50 MCG (2000 UT) capsule Take 2,000 Units by mouth 2 (two) times a week.         Objective:   BP 123/80   Pulse (!) 48   Ht 5\' 10"  (1.778 m)   Wt 159 lb (72.1 kg)   BMI 22.81 kg/m   Wt Readings from Last 3 Encounters:  02/04/23 159 lb (72.1 kg)  12/09/22 159 lb 12.8 oz (72.5 kg)  11/19/22 159 lb (72.1 kg)    Physical Exam Vitals and nursing note reviewed.  Constitutional:      General: He is not in  acute distress.    Appearance: He is well-developed. He is not diaphoretic.  Eyes:     General: No scleral icterus.    Conjunctiva/sclera: Conjunctivae normal.  Neck:     Thyroid: No thyromegaly.  Cardiovascular:     Rate and Rhythm: Normal rate and regular rhythm.     Heart sounds: Normal heart sounds. No murmur heard. Pulmonary:     Effort: Pulmonary effort is normal. No respiratory distress.     Breath sounds: Normal breath sounds. No wheezing.  Musculoskeletal:        General: No swelling. Normal range of motion.     Cervical back: Neck supple.  Lymphadenopathy:     Cervical: No cervical adenopathy.  Skin:    General: Skin is warm and dry.     Findings: No rash.  Neurological:     Mental Status: He is alert and oriented to person, place, and time.     Coordination: Coordination normal.  Psychiatric:        Behavior: Behavior normal.       Assessment & Plan:   Problem List Items Addressed This Visit       Cardiovascular and Mediastinum   Paroxysmal atrial fibrillation (HCC)   Relevant Medications   apixaban (ELIQUIS) 5 MG TABS tablet   losartan (COZAAR) 50 MG tablet   sildenafil (REVATIO) 20 MG tablet   Other Relevant Orders   CBC with Differential/Platelet   CMP14+EGFR   Lipid panel   Hypertensive cardiovascular disease   Relevant Medications   apixaban (ELIQUIS) 5 MG TABS tablet   losartan (COZAAR) 50 MG tablet   sildenafil (REVATIO) 20 MG tablet   Other Relevant Orders   CBC with Differential/Platelet   CMP14+EGFR   Lipid panel   CAD (coronary artery disease)   Relevant Medications   apixaban (ELIQUIS) 5 MG TABS tablet   losartan (COZAAR) 50 MG tablet   sildenafil (REVATIO) 20 MG tablet   Other Relevant Orders   CBC with Differential/Platelet   CMP14+EGFR   Lipid panel     Other   Hyperlipidemia - Primary   Relevant Medications   apixaban (ELIQUIS) 5 MG TABS tablet   losartan (COZAAR) 50 MG tablet   sildenafil (REVATIO) 20 MG tablet   Other  Relevant Orders   CBC with Differential/Platelet   CMP14+EGFR   Lipid panel   Statin intolerance   Other Visit Diagnoses       Erectile dysfunction, unspecified erectile dysfunction type       Relevant Medications   sildenafil (REVATIO) 20 MG tablet     Continue current medicine, seems to be doing well.  No changes    Follow up plan: Return in about 6 months (around 08/04/2023), or if symptoms  worsen or fail to improve, for Physical exam and hypertension and hyperlipidemia.  Counseling provided for all of the vaccine components Orders Placed This Encounter  Procedures   CBC with Differential/Platelet   CMP14+EGFR   Lipid panel    Arville Care, MD [facility] 02/04/2023, 3:20 PM

## 2023-02-05 LAB — CBC WITH DIFFERENTIAL/PLATELET
Basophils Absolute: 0.1 10*3/uL (ref 0.0–0.2)
Basos: 1 %
EOS (ABSOLUTE): 0.4 10*3/uL (ref 0.0–0.4)
Eos: 4 %
Hematocrit: 48.1 % (ref 37.5–51.0)
Hemoglobin: 15.9 g/dL (ref 13.0–17.7)
Immature Grans (Abs): 0 10*3/uL (ref 0.0–0.1)
Immature Granulocytes: 0 %
Lymphocytes Absolute: 2.5 10*3/uL (ref 0.7–3.1)
Lymphs: 30 %
MCH: 31.7 pg (ref 26.6–33.0)
MCHC: 33.1 g/dL (ref 31.5–35.7)
MCV: 96 fL (ref 79–97)
Monocytes Absolute: 0.8 10*3/uL (ref 0.1–0.9)
Monocytes: 10 %
Neutrophils Absolute: 4.5 10*3/uL (ref 1.4–7.0)
Neutrophils: 55 %
Platelets: 218 10*3/uL (ref 150–450)
RBC: 5.01 x10E6/uL (ref 4.14–5.80)
RDW: 13.5 % (ref 11.6–15.4)
WBC: 8.2 10*3/uL (ref 3.4–10.8)

## 2023-02-05 LAB — CMP14+EGFR
ALT: 15 IU/L (ref 0–44)
AST: 19 IU/L (ref 0–40)
Albumin: 4.3 g/dL (ref 3.8–4.8)
Alkaline Phosphatase: 111 IU/L (ref 44–121)
BUN/Creatinine Ratio: 16 (ref 10–24)
BUN: 18 mg/dL (ref 8–27)
Bilirubin Total: 0.4 mg/dL (ref 0.0–1.2)
CO2: 23 mmol/L (ref 20–29)
Calcium: 10.4 mg/dL — ABNORMAL HIGH (ref 8.6–10.2)
Chloride: 103 mmol/L (ref 96–106)
Creatinine, Ser: 1.1 mg/dL (ref 0.76–1.27)
Globulin, Total: 2.3 g/dL (ref 1.5–4.5)
Glucose: 78 mg/dL (ref 70–99)
Potassium: 4.8 mmol/L (ref 3.5–5.2)
Sodium: 142 mmol/L (ref 134–144)
Total Protein: 6.6 g/dL (ref 6.0–8.5)
eGFR: 69 mL/min/{1.73_m2} (ref >59)

## 2023-02-05 LAB — LIPID PANEL
Cholesterol, Total: 114 mg/dL (ref 100–199)
HDL: 52 mg/dL (ref >39)
LDL CALC COMMENT:: 2.2 ratio (ref 0.0–5.0)
LDL Chol Calc (NIH): 45 mg/dL (ref 0–99)
Triglycerides: 87 mg/dL (ref 0–149)
VLDL Cholesterol Cal: 17 mg/dL (ref 5–40)

## 2023-02-06 ENCOUNTER — Encounter: Payer: Self-pay | Admitting: Family Medicine

## 2023-02-16 ENCOUNTER — Ambulatory Visit: Payer: Medicare PPO | Admitting: Surgery

## 2023-02-16 ENCOUNTER — Encounter: Payer: Self-pay | Admitting: Surgery

## 2023-02-16 ENCOUNTER — Ambulatory Visit (INDEPENDENT_AMBULATORY_CARE_PROVIDER_SITE_OTHER)
Admission: RE | Admit: 2023-02-16 | Discharge: 2023-02-16 | Disposition: A | Discharge status: Home / Self Care | Payer: Medicare PPO | Source: Ambulatory Visit | Attending: Surgery | Admitting: Surgery

## 2023-02-16 ENCOUNTER — Ambulatory Visit (HOSPITAL_COMMUNITY)
Admission: RE | Admit: 2023-02-16 | Discharge: 2023-02-16 | Disposition: A | Discharge status: Home / Self Care | Payer: Medicare PPO | Source: Ambulatory Visit | Attending: Surgery | Admitting: Surgery

## 2023-02-16 VITALS — BP 162/101 | HR 54 | Temp 97.8°F | Resp 20 | Ht 70.0 in | Wt 159.0 lb

## 2023-02-16 DIAGNOSIS — I714 Abdominal aortic aneurysm, without rupture, unspecified: Secondary | ICD-10-CM

## 2023-02-16 DIAGNOSIS — I7143 Infrarenal abdominal aortic aneurysm, without rupture: Secondary | ICD-10-CM

## 2023-02-16 DIAGNOSIS — I701 Atherosclerosis of renal artery: Secondary | ICD-10-CM | POA: Insufficient documentation

## 2023-02-16 NOTE — Progress Notes (Signed)
[facility] [place]  Patient name: Dustin Bauer MRN: [id number] DOB: 06-15-1945 Sex: male   REASON FOR VISIT:    Follow up  HISOTRY OF PRESENT ILLNESS:    Dustin Bauer is a 78 y.o. male who returns today for follow-up of his abdominal aortic aneurysm.  This was initially found in 2017 on CT scan where it measured 3.9 cm.  His most recent ultrasound has increased to 4.7 cm.  He is back today for follow-up without complaints  The patient has undergone brachytherapy for prostate cancer.  He is medically managed for hypertension with an ARB.  He takes a statin for hypercholesterolemia.  He is on Eliquis for atrial fibrillation.  He is a former smoker.  He has a history of coronary artery disease status post PCI   PAST MEDICAL HISTORY:   Past Medical History:  Diagnosis Date   AAA (abdominal aortic aneurysm) (HCC) 01/20/2017   3.9cm infrarenal   Allergy to ACE inhibitors    Cough and fatigue with ACEI   Aortic atherosclerosis (HCC) 08/18/2017   Noted on CT chest   Atrial fibrillation (HCC)    CAD (coronary artery disease) stent x 1 2011 and 1  2014   a. LHC in 2011 for stable angina >> DES to pLAD [facility])  //  b. inf-post STEMI 7/15 c/b CHB with jxn escape >> LHC: OM1 60-70, pLAD 30-40, m-d RCA occluded, EF 50-55 >> PCI: 3 x 33 mm Xience DES to RCA  //  c. LHC 8/17: dLM 20, pLAD 20, pLAD stent ok, pRI 30, pRCA stent ok with 30% ISR   Colon polyps    Diverticulosis    Emphysema lung (HCC) 08/18/2017   Noted on CT chest   Gastric stenosis 08/27/2016   at pylorus, noted on endoscopy   GERD (gastroesophageal reflux disease)    Hydrocele, bilateral 01/30/2017   Noted on CT chest   Hyperlipidemia    Myalgias with Zocor, Crestor, atorvastatin.    Nicotine abuse    PAF (paroxysmal atrial fibrillation) [facility])    s/p ablation by Dr Dorris Fetch at [facility] in 2012 // Xarelto anticoagulation  //  ILR removed 2/17 // Event Monitor  11/17: Predominantly NSR (90%). Paroxysmal atrial fibrillation (10%).  Atrial fibrillation appears to be rate-controlled when it occurs.    Prostate cancer (HCC) dx 2019   Pulmonary nodule, right 08/18/2017   6 mm right upper lobe, 5.5 mm right middle lobe, Noted on CT Chest   Pyloric stenosis    Thoracic ascending aortic aneurysm (HCC) 01/30/2017   3.9 cm     FAMILY HISTORY:   Family History  Problem Relation Age of Onset   Dementia Mother    Colon cancer Mother    Prostate cancer Mother    Heart disease Father    Hyperlipidemia Father    Pancreatic cancer Sister    Colon cancer Maternal Grandmother    Esophageal cancer Neg Hx    Stomach cancer Neg Hx     SOCIAL HISTORY:   Social History   Tobacco Use   Smoking status: Former    Current packs/day: 0.00    Average packs/day: 0.3 packs/day for 30.0 years (7.5 ttl pk-yrs)    Types: Cigarettes    Start date: 07/20/1973    Quit date: 07/21/2003    Years since quitting: 19.5   Smokeless tobacco: Never  Substance Use Topics   Alcohol use: Yes    Alcohol/week: 1.0 standard drink of alcohol  Types: 1 Glasses of wine per week    Comment: occasional wine or beer     ALLERGIES:   Allergies  Allergen Reactions   Clopidogrel Other (See Comments)    unknown reaction   Crestor [Rosuvastatin Calcium] Other (See Comments)    Extreme muscular weakness   Lipitor [Atorvastatin Calcium] Other (See Comments)    Extreme muscular weakness    Statins Other (See Comments)    Extreme muscular weakness      CURRENT MEDICATIONS:   Current Outpatient Medications  Medication Sig Dispense Refill   acetaminophen (TYLENOL) 325 MG tablet Take 325 mg by mouth as needed (FOR PAIN.).     apixaban (ELIQUIS) 5 MG TABS tablet Take 1 tablet (5 mg total) by mouth 2 (two) times daily. 180 tablet 3   Cholecalciferol (VITAMIN D3) 50 MCG (2000 UT) capsule Take 2,000 Units by mouth 2 (two) times a week.     clotrimazole-betamethasone (LOTRISONE)  cream Apply 1 Application topically 2 (two) times daily as needed (eczema).     Coenzyme Q10 (COQ-10) 100 MG CAPS Take 100 mg by mouth 2 (two) times a week.     Cyanocobalamin (B-12-SL) 1000 MCG SUBL Place 1,000 mcg under the tongue 3 (three) times a week.     Evolocumab (REPATHA SURECLICK) 140 MG/ML SOAJ Inject 140 mg into the skin every 14 (fourteen) days. 6 mL 3   losartan (COZAAR) 50 MG tablet Take 1 tablet (50 mg total) by mouth daily. 90 tablet 3   Magnesium 400 MG TABS Take 400 mg by mouth 3 (three) times a week.     sildenafil (REVATIO) 20 MG tablet Take 1-5 tablets (20-100 mg total) by mouth as needed. 225 tablet 2   No current facility-administered medications for this visit.    REVIEW OF SYSTEMS:   [X]  denotes positive finding, [ ]  denotes negative finding Cardiac  Comments:  Chest pain or chest pressure:    Shortness of breath upon exertion:    Short of breath when lying flat:    Irregular heart rhythm:        Vascular    Pain in calf, thigh, or hip brought on by ambulation:    Pain in feet at night that wakes you up from your sleep:     Blood clot in your veins:    Leg swelling:         Pulmonary    Oxygen at home:    Productive cough:     Wheezing:         Neurologic    Sudden weakness in arms or legs:     Sudden numbness in arms or legs:     Sudden onset of difficulty speaking or slurred speech:    Temporary loss of vision in one eye:     Problems with dizziness:         Gastrointestinal    Blood in stool:     Vomited blood:         Genitourinary    Burning when urinating:     Blood in urine:        Psychiatric    Major depression:         Hematologic    Bleeding problems:    Problems with blood clotting too easily:        Skin    Rashes or ulcers:        Constitutional    Fever or chills:      PHYSICAL EXAM:  Vitals:   02/16/23 1140  BP: (!) 162/101  Pulse: (!) 54  Resp: 20  Temp: 97.8 F (36.6 C)  SpO2: 97%  Weight: 159 lb (72.1  kg)  Height: 5\' 10"  (1.778 m)    GENERAL: The patient is a well-nourished male, in no acute distress. The vital signs are documented above. CARDIAC: There is a regular rate and rhythm.  VASCULAR: I could not palpate pedal pulses PULMONARY: Non-labored respirations ABDOMEN: Soft and non-tender  MUSCULOSKELETAL: There are no major deformities or cyanosis. NEUROLOGIC: No focal weakness or paresthesias are detected. SKIN: There are no ulcers or rashes noted. PSYCHIATRIC: The patient has a normal affect.  STUDIES:   I have reviewed the following studies: Abdominal Aorta Findings:  +-----------+-------+----------+----------+--------+--------+--------+  Location  AP (cm)Trans (cm)PSV (cm/s)WaveformThrombusComments  +-----------+-------+----------+----------+--------+--------+--------+  Proximal  2.40   -         54                                  +-----------+-------+----------+----------+--------+--------+--------+  Mid       4.71   4.74      39                                  +-----------+-------+----------+----------+--------+--------+--------+  Distal    2.67   2.64      41                                  +-----------+-------+----------+----------+--------+--------+--------+  RT CIA Prox1.8    1.8       66                                  +-----------+-------+----------+----------+--------+--------+--------+  LT CIA Prox1.7    1.6       53                                    Renal:    Right: No evidence of right renal artery stenosis. RRV flow present.  Left:  Evidence of a > 60% stenosis in the left renal artery. LRV         flow present.   MEDICAL ISSUES:   AAA: Maximum diameter is 4.7 cm.  He will return in 6 months to the PA clinic ultrasound.  If this aneurysm is increased in size, he will need a CT angiogram.  I told and I would repair this at 5 cm  Renal stenosis: There is greater than 60% left renal artery stenosis.  His  blood pressure appears to be well-controlled and so there is no indication for further evaluation.  PAD: I could not palpate pedal pulses today and so I will    Durene Cal, IV, MD, FACS [facility] [phone] Pager [phone]

## 2023-02-27 ENCOUNTER — Other Ambulatory Visit: Payer: Self-pay

## 2023-02-27 DIAGNOSIS — I7143 Infrarenal abdominal aortic aneurysm, without rupture: Secondary | ICD-10-CM

## 2023-02-27 DIAGNOSIS — I739 Peripheral vascular disease, unspecified: Secondary | ICD-10-CM

## 2023-03-06 ENCOUNTER — Encounter: Payer: Self-pay | Admitting: Pharmacy Technician

## 2023-03-06 ENCOUNTER — Telehealth: Payer: Self-pay | Admitting: Pharmacy Technician

## 2023-03-06 ENCOUNTER — Other Ambulatory Visit (HOSPITAL_COMMUNITY): Payer: Self-pay

## 2023-03-06 NOTE — Telephone Encounter (Signed)
error 

## 2023-03-06 NOTE — Telephone Encounter (Signed)
 Pharmacy Patient Advocate Encounter   Received notification from CoverMyMeds that prior authorization for Sildenafil Citrate (PAH) 20MG  tabletsis required/requested.   Insurance verification completed.   The patient is insured through [employer] .   Per test claim: PA required; PA submitted to above mentioned insurance via CoverMyMeds Key/confirmation #/EOC [id number] Status is pending

## 2023-03-09 NOTE — Telephone Encounter (Signed)
 Pharmacy Patient Advocate Encounter  Received notification from [facility] that Prior Authorization for Sildenafil Citrate (PAH) 20MG  tablets has been DENIED.  Full denial letter will be uploaded to the media tab. See denial reason below.   PA #/Case ID/Reference #: [id number]

## 2023-03-09 NOTE — Telephone Encounter (Signed)
 Our office does not complete prior auth's on Sidenafil. Pt will have to pay out of pocket.

## 2023-03-10 ENCOUNTER — Other Ambulatory Visit (HOSPITAL_COMMUNITY): Payer: Self-pay

## 2023-03-13 DIAGNOSIS — C61 Malignant neoplasm of prostate: Secondary | ICD-10-CM | POA: Diagnosis not present

## 2023-03-13 DIAGNOSIS — Z8546 Personal history of malignant neoplasm of prostate: Secondary | ICD-10-CM | POA: Diagnosis not present

## 2023-03-20 DIAGNOSIS — R3912 Poor urinary stream: Secondary | ICD-10-CM | POA: Diagnosis not present

## 2023-03-20 DIAGNOSIS — R3915 Urgency of urination: Secondary | ICD-10-CM | POA: Diagnosis not present

## 2023-03-20 DIAGNOSIS — Z8546 Personal history of malignant neoplasm of prostate: Secondary | ICD-10-CM | POA: Diagnosis not present

## 2023-03-31 DIAGNOSIS — R3121 Asymptomatic microscopic hematuria: Secondary | ICD-10-CM | POA: Diagnosis not present

## 2023-04-13 DIAGNOSIS — I7143 Infrarenal abdominal aortic aneurysm, without rupture: Secondary | ICD-10-CM | POA: Diagnosis not present

## 2023-04-13 DIAGNOSIS — R3121 Asymptomatic microscopic hematuria: Secondary | ICD-10-CM | POA: Diagnosis not present

## 2023-04-13 DIAGNOSIS — R319 Hematuria, unspecified: Secondary | ICD-10-CM | POA: Diagnosis not present

## 2023-04-13 DIAGNOSIS — R3129 Other microscopic hematuria: Secondary | ICD-10-CM | POA: Diagnosis not present

## 2023-05-05 DIAGNOSIS — R3912 Poor urinary stream: Secondary | ICD-10-CM | POA: Diagnosis not present

## 2023-05-05 DIAGNOSIS — R3915 Urgency of urination: Secondary | ICD-10-CM | POA: Diagnosis not present

## 2023-05-05 DIAGNOSIS — R3121 Asymptomatic microscopic hematuria: Secondary | ICD-10-CM | POA: Diagnosis not present

## 2023-06-09 ENCOUNTER — Ambulatory Visit (HOSPITAL_COMMUNITY): Payer: Medicare PPO | Admitting: Physician Assistant

## 2023-06-18 ENCOUNTER — Inpatient Hospital Stay (HOSPITAL_COMMUNITY)
Admission: RE | Admit: 2023-06-18 | Discharge: 2023-06-18 | Disposition: A | Discharge status: Home / Self Care | Source: Ambulatory Visit | Attending: Physician Assistant

## 2023-06-18 ENCOUNTER — Ambulatory Visit (HOSPITAL_COMMUNITY)
Admission: RE | Admit: 2023-06-18 | Discharge: 2023-06-18 | Disposition: A | Discharge status: Home / Self Care | Source: Ambulatory Visit | Attending: Physician Assistant | Admitting: Physician Assistant

## 2023-06-18 ENCOUNTER — Encounter (HOSPITAL_COMMUNITY): Payer: Self-pay | Admitting: Physician Assistant

## 2023-06-18 VITALS — BP 108/70 | HR 49 | Ht 70.0 in | Wt 157.2 lb

## 2023-06-18 DIAGNOSIS — Z8679 Personal history of other diseases of the circulatory system: Secondary | ICD-10-CM

## 2023-06-18 DIAGNOSIS — D6869 Other thrombophilia: Secondary | ICD-10-CM

## 2023-06-18 DIAGNOSIS — I480 Paroxysmal atrial fibrillation: Secondary | ICD-10-CM

## 2023-06-18 DIAGNOSIS — I441 Atrioventricular block, second degree: Secondary | ICD-10-CM | POA: Diagnosis not present

## 2023-06-18 NOTE — Progress Notes (Signed)
 Primary Care Physician: Dettinger, Lucio Sabin, MD Primary Cardiologist: Eilleen Grates, MD Electrophysiologist: Efraim Grange, MD  Referring Physician: Dr. Orvan Blanch is a 78 y.o. male with a history of CAD s/p inferior wall MI and stent, HTN, abdominal aortic aneurysm, chronic ischemic heart disease, pyloric stricture, HLD, and paroxysmal atrial fibrillation who presents for follow up in the [facility]. History of ablation at [facility] in 2012. He is now s/p Afib ablation on 08/14/22 by Dr. Arlester Ladd. Patient contacted office noting to be in Afib since 8/9. Patient is on Eliquis  5 mg BID for a CHADS2VASC score of 4.  On evaluation today, he is currently in Afib with slow ventricular response. Patient admits that on 8/9 when he went into Afib he maybe walked for too long outside in the hot weather and overexerted himself. He has been in Afib since then, confirmed with Apple Watch. No missed doses of Eliquis . He does not have CP or trouble swallowing. He has SOB with activity. Leg sites healed without issue.   On follow up 09/11/22, he is currently in NSR. He was scheduled for cardioversion on 8/20 after last OV but procedure was canceled due to him being in normal rhythm. Since then, he notes to have episodes of Afib after he goes on a hike with his dogs. He was in Afib this past week until today when he converted to NSR. He wears an Apple watch for monitoring. He notes to have an overall lower HR ever since his MI. No missed doses of Eliquis .  Follow up 06/18/23. Patient reports that his smart watch has shown a 12% afib burden recently. He does report occasional fatigue and dizziness. He denies presyncope or falls. No bleeding issues on anticoagulation.     he has a BMI of Body mass index is 22.56 kg/m.Aaron Aas Filed Weights   06/18/23 1346  Weight: 71.3 kg    Current Outpatient Medications  Medication Sig Dispense Refill   acetaminophen  (TYLENOL ) 325 MG  tablet Take 325 mg by mouth as needed (FOR PAIN.).     apixaban  (ELIQUIS ) 5 MG TABS tablet Take 1 tablet (5 mg total) by mouth 2 (two) times daily. 180 tablet 3   Cholecalciferol (VITAMIN D3) 50 MCG (2000 UT) capsule Take 2,000 Units by mouth 2 (two) times a week.     clotrimazole-betamethasone  (LOTRISONE) cream Apply 1 Application topically 2 (two) times daily as needed (eczema).     Coenzyme Q10 (COQ-10) 100 MG CAPS Take 100 mg by mouth 2 (two) times a week.     Cyanocobalamin (B-12-SL) 1000 MCG SUBL Place 1,000 mcg under the tongue 3 (three) times a week.     Evolocumab  (REPATHA  SURECLICK) 140 MG/ML SOAJ Inject 140 mg into the skin every 14 (fourteen) days. 6 mL 3   losartan  (COZAAR ) 50 MG tablet Take 1 tablet (50 mg total) by mouth daily. 90 tablet 3   Magnesium 400 MG TABS Take 400 mg by mouth 3 (three) times a week.     sildenafil  (REVATIO ) 20 MG tablet Take 1-5 tablets (20-100 mg total) by mouth as needed. 225 tablet 2   No current facility-administered medications for this encounter.    Atrial Fibrillation Management history:  Previous antiarrhythmic drugs: None Previous cardioversions: none Previous ablations: 2012, 08/14/22 Anticoagulation history: Eliquis    ROS- All systems are reviewed and negative except as per the HPI above.  Physical Exam: BP 108/70   Pulse (!) 49  Ht 5\' 10"  (1.778 m)   Wt 71.3 kg   BMI 22.56 kg/m    GEN: Well nourished, well developed in no acute distress CARDIAC: slightly irregular rhythm, no murmurs, rubs, gallops RESPIRATORY:  Clear to auscultation without rales, wheezing or rhonchi  ABDOMEN: Soft, non-tender, non-distended EXTREMITIES:  No edema; No deformity    EKG today demonstrates  Mobitz I AV block Vent. rate 49 BPM PR interval * ms QRS duration 76 ms QT/QTcB 452/408 ms   Echo 12/15/2013 demonstrated  Study Conclusions  - Left ventricle: The cavity size was normal. Wall thickness was    increased in a pattern of mild LVH.  Systolic function was normal.    The estimated ejection fraction was in the range of 60% to 65%.    Wall motion was normal; there were no regional wall motion    abnormalities. Doppler parameters are consistent with abnormal    left ventricular relaxation (grade 1 diastolic dysfunction). The    E/e&' ratio is <8, suggesting normal LV filling pressure.  - Mitral valve: Mildly thickened leaflets .  - Left atrium: The atrium was at the upper limits of normal in    size.  - Right atrium: The atrium was mildly dilated.    CHA2DS2-VASc Score = 4  The patient's score is based upon: CHF History: 0 HTN History: 1 Diabetes History: 0 Stroke History: 0 Vascular Disease History: 1 Age Score: 2 Gender Score: 0       ASSESSMENT AND PLAN: Paroxysmal Atrial Fibrillation/atrial flutter The patient's CHA2DS2-VASc score is 4, indicating a 4.8% annual risk of stroke.   S/p ablation 2012 ([facility]) and 08/14/22 with Dr Arlester Ladd Patient not in afib today but his smart watch has shown 12% afib burden. Will have him wear a 2 week monitor to assess burden and see if this correlates with his symptoms.  Continue Eliquis  5 mg BID  Secondary Hypercoagulable State (ICD10:  D68.69) The patient is at significant risk for stroke/thromboembolism based upon his CHA2DS2-VASc Score of 4.  Continue Apixaban  (Eliquis ). No bleeding issues.  CAD STMEI 2015 No anginal symptoms Followed by Dr Lavonne Prairie  HTN Stable on current regimen  Mobitz I 2nd degree AV block Bradycardia is longstanding but block is new. Patient not on any AV nodal agents Monitor as above.    Follow up in the AF clinic in 3-4 weeks.    [facility] [address] [place], [place] [address] [phone]

## 2023-06-23 DIAGNOSIS — L820 Inflamed seborrheic keratosis: Secondary | ICD-10-CM | POA: Diagnosis not present

## 2023-06-23 DIAGNOSIS — D225 Melanocytic nevi of trunk: Secondary | ICD-10-CM | POA: Diagnosis not present

## 2023-06-23 DIAGNOSIS — L821 Other seborrheic keratosis: Secondary | ICD-10-CM | POA: Diagnosis not present

## 2023-06-23 DIAGNOSIS — L570 Actinic keratosis: Secondary | ICD-10-CM | POA: Diagnosis not present

## 2023-06-23 DIAGNOSIS — Z85828 Personal history of other malignant neoplasm of skin: Secondary | ICD-10-CM | POA: Diagnosis not present

## 2023-06-23 DIAGNOSIS — D235 Other benign neoplasm of skin of trunk: Secondary | ICD-10-CM | POA: Diagnosis not present

## 2023-06-23 DIAGNOSIS — L814 Other melanin hyperpigmentation: Secondary | ICD-10-CM | POA: Diagnosis not present

## 2023-06-23 DIAGNOSIS — L579 Skin changes due to chronic exposure to nonionizing radiation, unspecified: Secondary | ICD-10-CM | POA: Diagnosis not present

## 2023-07-10 ENCOUNTER — Other Ambulatory Visit: Payer: Self-pay | Admitting: Surgery

## 2023-07-10 DIAGNOSIS — I7143 Infrarenal abdominal aortic aneurysm, without rupture: Secondary | ICD-10-CM

## 2023-07-10 DIAGNOSIS — I739 Peripheral vascular disease, unspecified: Secondary | ICD-10-CM

## 2023-07-10 DIAGNOSIS — I714 Abdominal aortic aneurysm, without rupture, unspecified: Secondary | ICD-10-CM

## 2023-07-13 NOTE — Addendum Note (Signed)
 Encounter addended by: Franchot Glade RAMAN, RN on: 07/13/2023 2:59 PM  Actions taken: Imaging Exam ended

## 2023-07-15 ENCOUNTER — Encounter (HOSPITAL_COMMUNITY): Payer: Self-pay | Admitting: Internal Medicine

## 2023-07-15 ENCOUNTER — Ambulatory Visit (HOSPITAL_COMMUNITY)
Admission: RE | Admit: 2023-07-15 | Discharge: 2023-07-15 | Disposition: A | Discharge status: Home / Self Care | Source: Ambulatory Visit | Attending: Internal Medicine | Admitting: Internal Medicine

## 2023-07-15 VITALS — BP 142/60 | HR 55 | Ht 70.0 in | Wt 158.4 lb

## 2023-07-15 DIAGNOSIS — D6869 Other thrombophilia: Secondary | ICD-10-CM | POA: Diagnosis not present

## 2023-07-15 DIAGNOSIS — I480 Paroxysmal atrial fibrillation: Secondary | ICD-10-CM | POA: Diagnosis not present

## 2023-07-15 NOTE — Progress Notes (Signed)
 Primary Care Physician: Dettinger, Fonda LABOR, MD Primary Cardiologist: Lynwood Schilling, MD Electrophysiologist: Eulas FORBES Furbish, MD  Referring Physician: Dr. Furbish Dustin Bauer Dustin Bauer is a 78 y.o. male with a history of CAD s/p inferior wall MI and stent, HTN, abdominal aortic aneurysm, chronic ischemic heart disease, pyloric stricture, HLD, and paroxysmal atrial fibrillation who presents for follow up in the [facility]. History of ablation at [facility] in 2012. He is now s/p Afib ablation on 08/14/22 by Dr. Furbish. Patient contacted office noting to be in Afib since 8/9. Patient is on Eliquis  5 mg BID for a CHADS2VASC score of 4.  On evaluation today, he is currently in Afib with slow ventricular response. Patient admits that on 8/9 when he went into Afib he maybe walked for too long outside in the hot weather and overexerted himself. He has been in Afib since then, confirmed with Apple Watch. No missed doses of Eliquis . He does not have CP or trouble swallowing. He has SOB with activity. Leg sites healed without issue.   On follow up 09/11/22, he is currently in NSR. He was scheduled for cardioversion on 8/20 after last OV but procedure was canceled due to him being in normal rhythm. Since then, he notes to have episodes of Afib after he goes on a hike with his dogs. He was in Afib this past week until today when he converted to NSR. He wears an Apple watch for monitoring. He notes to have an overall lower HR ever since his MI. No missed doses of Eliquis .  Follow up 06/18/23. Patient reports that his smart watch has shown a 12% afib burden recently. He does report occasional fatigue and dizziness. He denies presyncope or falls. No bleeding issues on anticoagulation.   On follow up 07/15/23, patient is currently in NSR. Cardiac monitor placed after last visit due to reported Afib burden on smart watch. Monitor showed predominantly sinus rhythm with 11.5% atrial ectopy. No missed  doses of anticoagulation.  Today, he denies symptoms of palpitations, chest pain, shortness of breath, orthopnea, PND, lower extremity edema, dizziness, presyncope, syncope, snoring, daytime somnolence, bleeding, or neurologic sequela. The patient is tolerating medications without difficulties and is otherwise without complaint today.    he has a BMI of Body mass index is 22.73 kg/m.SABRA Filed Weights   07/15/23 1322  Weight: 71.8 kg     Current Outpatient Medications  Medication Sig Dispense Refill   acetaminophen  (TYLENOL ) 325 MG tablet Take 325 mg by mouth as needed (FOR PAIN.).     apixaban  (ELIQUIS ) 5 MG TABS tablet Take 1 tablet (5 mg total) by mouth 2 (two) times daily. 180 tablet 3   Cholecalciferol (VITAMIN D3) 50 MCG (2000 UT) capsule Take 2,000 Units by mouth 2 (two) times a week.     clotrimazole-betamethasone  (LOTRISONE) cream Apply 1 Application topically 2 (two) times daily as needed (eczema).     Coenzyme Q10 (COQ-10) 100 MG CAPS Take 100 mg by mouth 2 (two) times a week.     Cyanocobalamin (B-12-SL) 1000 MCG SUBL Place 1,000 mcg under the tongue 3 (three) times a week.     Evolocumab  (REPATHA  SURECLICK) 140 MG/ML SOAJ Inject 140 mg into the skin every 14 (fourteen) days. 6 mL 3   losartan  (COZAAR ) 50 MG tablet Take 1 tablet (50 mg total) by mouth daily. 90 tablet 3   Magnesium 400 MG TABS Take 400 mg by mouth 3 (three) times a  week.     sildenafil  (REVATIO ) 20 MG tablet Take 1-5 tablets (20-100 mg total) by mouth as needed. 225 tablet 2   No current facility-administered medications for this encounter.    Atrial Fibrillation Management history:  Previous antiarrhythmic drugs: None Previous cardioversions: none Previous ablations: 2012, 08/14/22 Anticoagulation history: Eliquis    ROS- All systems are reviewed and negative except as per the HPI above.  Physical Exam: BP (!) 142/60   Pulse (!) 55   Ht 5' 10 (1.778 m)   Wt 71.8 kg   BMI 22.73 kg/m   GEN- The  patient is well appearing, alert and oriented x 3 today.   Neck - no JVD or carotid bruit noted Lungs- Clear to ausculation bilaterally, normal work of breathing Heart- Regular rate and rhythm (I could appreciate bigeminy then transition to regular rate), no murmurs, rubs or gallops, PMI not laterally displaced Extremities- no clubbing, cyanosis, or edema Skin - no rash or ecchymosis noted   EKG today demonstrates  Vent. rate 55 BPM PR interval 268 ms QRS duration 76 ms QT/QTcB 434/415 ms P-R-T axes 56 91 28 Sinus bradycardia with 1st degree A-V block with Premature atrial complexes in a pattern of bigeminy Rightward axis Nonspecific ST and T wave abnormality Abnormal ECG When compared with ECG of 18-Jun-2023 13:49, Premature atrial complexes are now Present Sinus rhythm is no longer with 2nd degree A-V block (Mobitz I) Nonspecific T wave abnormality has replaced inverted T waves in Inferior leads   Echo 12/15/2013 demonstrated  Study Conclusions  - Left ventricle: The cavity size was normal. Wall thickness was    increased in a pattern of mild LVH. Systolic function was normal.    The estimated ejection fraction was in the range of 60% to 65%.    Wall motion was normal; there were no regional wall motion    abnormalities. Doppler parameters are consistent with abnormal    left ventricular relaxation (grade 1 diastolic dysfunction). The    E/e&' ratio is <8, suggesting normal LV filling pressure.  - Mitral valve: Mildly thickened leaflets .  - Left atrium: The atrium was at the upper limits of normal in    size.  - Right atrium: The atrium was mildly dilated.   Cardiac monitor 06/2023: Predominant rhythm: Sinus Sinus HR: 48 - 110 bpm, AVG 57 bpm   11.5% atrial ectopy <1%  ventricular ectopy   Arrhythmia detected: Frequent SVT episodes, longest duration 28.2 seconds. Rhythm appears most consistent with atrial tachycardia   Patient triggered events: single patient triggered  episode correlated with an isolated PVC   CHA2DS2-VASc Score = 4  The patient's score is based upon: CHF History: 0 HTN History: 1 Diabetes History: 0 Stroke History: 0 Vascular Disease History: 1 Age Score: 2 Gender Score: 0       ASSESSMENT AND PLAN: Paroxysmal Atrial Fibrillation/atrial flutter The patient's CHA2DS2-VASc score is 4, indicating a 4.8% annual risk of stroke.   S/p ablation 2012 [facility]) and 08/14/22 with Dr. Nancey.  He is currently in NSR. We discussed reassuring cardiac monitor results and also discussed Kardiamobile device for rhythm monitoring at home. His watch sometimes will give him difficulty in doing an ECG. We will not do any nodal agent due to HR and AV block. He is staying active which is great.  Secondary Hypercoagulable State (ICD10:  D68.69) The patient is at significant risk for stroke/thromboembolism based upon his CHA2DS2-VASc Score of 4.  Continue Apixaban  (Eliquis ).  No bleeding issues  CAD STEMI 2015 Followed by Dr Lavona No anginal symptoms today.  HTN Stable today.  Mobitz I 2nd degree AV block Bradycardia is longstanding but block is new. Patient not on any AV nodal agents - he is not in 2nd degree block today but back in 1st degree. No progression of higher grade AV block on cardiac monitor. Will continue to monitor.    Follow up 6 months Afib clinic.   Dustin Bauer, [facility] [address] [place], [place] [address] [phone]

## 2023-07-27 ENCOUNTER — Ambulatory Visit: Attending: Surgery | Admitting: Surgery

## 2023-07-27 ENCOUNTER — Ambulatory Visit (HOSPITAL_BASED_OUTPATIENT_CLINIC_OR_DEPARTMENT_OTHER)
Admission: RE | Admit: 2023-07-27 | Discharge: 2023-07-27 | Discharge status: Home / Self Care | Source: Ambulatory Visit | Attending: Surgery | Admitting: Surgery

## 2023-07-27 ENCOUNTER — Encounter: Payer: Self-pay | Admitting: Surgery

## 2023-07-27 ENCOUNTER — Ambulatory Visit (HOSPITAL_COMMUNITY)
Admission: RE | Admit: 2023-07-27 | Discharge: 2023-07-27 | Disposition: A | Discharge status: Home / Self Care | Source: Ambulatory Visit | Attending: Surgery | Admitting: Surgery

## 2023-07-27 VITALS — BP 157/86 | HR 45 | Temp 97.7°F | Ht 70.0 in | Wt 155.0 lb

## 2023-07-27 DIAGNOSIS — I739 Peripheral vascular disease, unspecified: Secondary | ICD-10-CM

## 2023-07-27 DIAGNOSIS — I7143 Infrarenal abdominal aortic aneurysm, without rupture: Secondary | ICD-10-CM | POA: Diagnosis not present

## 2023-07-27 DIAGNOSIS — I714 Abdominal aortic aneurysm, without rupture, unspecified: Secondary | ICD-10-CM | POA: Insufficient documentation

## 2023-07-27 LAB — VAS US ABI WITH/WO TBI
Left ABI: 1.14
Right ABI: 1.1

## 2023-07-27 NOTE — Progress Notes (Signed)
 [facility] [place]  Patient name: Dustin Bauer MRN: [id number] DOB: January 29, 1945 Sex: male   REASON FOR VISIT:    Follow up  HISOTRY OF PRESENT ILLNESS:    Dustin Bauer is a 78 y.o. male who returns today for follow-up of his abdominal aortic aneurysm which was initially detected in 2017 on a CT scan.  He did get a CT scan in March that showed a 4.9 cm aneurysm  The patient has undergone brachytherapy for prostate cancer. He is medically managed for hypertension with an ARB. He takes a statin for hypercholesterolemia. He is on Eliquis  for atrial fibrillation. He is a former smoker. He has a history of coronary artery disease status post PCI   PAST MEDICAL HISTORY:   Past Medical History:  Diagnosis Date   AAA (abdominal aortic aneurysm) (HCC) 01/20/2017   3.9cm infrarenal   Allergy to ACE inhibitors    Cough and fatigue with ACEI   Aortic atherosclerosis (HCC) 08/18/2017   Noted on CT chest   Atrial fibrillation (HCC)    CAD (coronary artery disease) stent x 1 2011 and 1  2014   a. LHC in 2011 for stable angina >> DES to pLAD [facility])  //  b. inf-post STEMI 7/15 c/b CHB with jxn escape >> LHC: OM1 60-70, pLAD 30-40, m-d RCA occluded, EF 50-55 >> PCI: 3 x 33 mm Xience DES to RCA  //  c. LHC 8/17: dLM 20, pLAD 20, pLAD stent ok, pRI 30, pRCA stent ok with 30% ISR   Colon polyps    Diverticulosis    Emphysema lung (HCC) 08/18/2017   Noted on CT chest   Gastric stenosis 08/27/2016   at pylorus, noted on endoscopy   GERD (gastroesophageal reflux disease)    Hydrocele, bilateral 01/30/2017   Noted on CT chest   Hyperlipidemia    Myalgias with Zocor, Crestor, atorvastatin.    Nicotine abuse    PAF (paroxysmal atrial fibrillation) [facility])    s/p ablation by Dr Kerrin at [facility] in 2012 // Xarelto  anticoagulation  //  ILR removed 2/17 // Event Monitor 11/17: Predominantly NSR (90%). Paroxysmal atrial fibrillation (10%).   Atrial fibrillation appears to be rate-controlled when it occurs.    Prostate cancer (HCC) dx 2019   Pulmonary nodule, right 08/18/2017   6 mm right upper lobe, 5.5 mm right middle lobe, Noted on CT Chest   Pyloric stenosis    Thoracic ascending aortic aneurysm (HCC) 01/30/2017   3.9 cm     FAMILY HISTORY:   Family History  Problem Relation Age of Onset   Dementia Mother    Colon cancer Mother    Prostate cancer Mother    Heart disease Father    Hyperlipidemia Father    Pancreatic cancer Sister    Colon cancer Maternal Grandmother    Esophageal cancer Neg Hx    Stomach cancer Neg Hx     SOCIAL HISTORY:   Social History   Tobacco Use   Smoking status: Former    Current packs/day: 0.00    Average packs/day: 0.3 packs/day for 30.0 years (7.5 ttl pk-yrs)    Types: Cigarettes    Start date: 07/20/1973    Quit date: 07/21/2003    Years since quitting: 20.0   Smokeless tobacco: Never   Tobacco comments:    Former smoker 06/18/23  Substance Use Topics   Alcohol use: Yes    Alcohol/week: 1.0 standard drink of alcohol    Types:  1 Glasses of wine per week    Comment: occasional wine or beer     ALLERGIES:   Allergies  Allergen Reactions   Clopidogrel Other (See Comments)    unknown reaction   Crestor [Rosuvastatin Calcium] Other (See Comments)    Extreme muscular weakness   Lipitor [Atorvastatin Calcium] Other (See Comments)    Extreme muscular weakness    Statins Other (See Comments)    Extreme muscular weakness      CURRENT MEDICATIONS:   Current Outpatient Medications  Medication Sig Dispense Refill   acetaminophen  (TYLENOL ) 325 MG tablet Take 325 mg by mouth as needed (FOR PAIN.).     apixaban  (ELIQUIS ) 5 MG TABS tablet Take 1 tablet (5 mg total) by mouth 2 (two) times daily. 180 tablet 3   Cholecalciferol (VITAMIN D3) 50 MCG (2000 UT) capsule Take 2,000 Units by mouth 2 (two) times a week.     clotrimazole-betamethasone  (LOTRISONE) cream Apply 1  Application topically 2 (two) times daily as needed (eczema).     Coenzyme Q10 (COQ-10) 100 MG CAPS Take 100 mg by mouth 2 (two) times a week.     Cyanocobalamin (B-12-SL) 1000 MCG SUBL Place 1,000 mcg under the tongue 3 (three) times a week.     Evolocumab  (REPATHA  SURECLICK) 140 MG/ML SOAJ Inject 140 mg into the skin every 14 (fourteen) days. 6 mL 3   losartan  (COZAAR ) 50 MG tablet Take 1 tablet (50 mg total) by mouth daily. 90 tablet 3   Magnesium 400 MG TABS Take 400 mg by mouth 3 (three) times a week.     sildenafil  (REVATIO ) 20 MG tablet Take 1-5 tablets (20-100 mg total) by mouth as needed. 225 tablet 2   No current facility-administered medications for this visit.    REVIEW OF SYSTEMS:   [X]  denotes positive finding, [ ]  denotes negative finding Cardiac  Comments:  Chest pain or chest pressure:    Shortness of breath upon exertion:    Short of breath when lying flat:    Irregular heart rhythm:        Vascular    Pain in calf, thigh, or hip brought on by ambulation:    Pain in feet at night that wakes you up from your sleep:     Blood clot in your veins:    Leg swelling:         Pulmonary    Oxygen at home:    Productive cough:     Wheezing:         Neurologic    Sudden weakness in arms or legs:     Sudden numbness in arms or legs:     Sudden onset of difficulty speaking or slurred speech:    Temporary loss of vision in one eye:     Problems with dizziness:         Gastrointestinal    Blood in stool:     Vomited blood:         Genitourinary    Burning when urinating:     Blood in urine:        Psychiatric    Major depression:         Hematologic    Bleeding problems:    Problems with blood clotting too easily:        Skin    Rashes or ulcers:        Constitutional    Fever or chills:      PHYSICAL EXAM:  Vitals:   07/27/23 1136  BP: (!) 157/86  Pulse: (!) 45  Temp: 97.7 F (36.5 C)  SpO2: 99%  Weight: 155 lb (70.3 kg)  Height: 5' 10  (1.778 m)    GENERAL: The patient is a well-nourished male, in no acute distress. The vital signs are documented above. CARDIAC: There is a regular rate and rhythm.  PULMONARY: Non-labored respirations ABDOMEN: Soft and non-tender with normal pitched bowel sounds.  MUSCULOSKELETAL: There are no major deformities or cyanosis. NEUROLOGIC: No focal weakness or paresthesias are detected. SKIN: There are no ulcers or rashes noted. PSYCHIATRIC: The patient has a normal affect.  STUDIES:   I have reviewed the following: ABI/TBIToday's ABIToday's TBIPrevious ABIPrevious TBI  +-------+-----------+-----------+------------+------------+  Right 1.10       .80                                  +-------+-----------+-----------+------------+------------+  Left  1.14       .76                                  +-------+-    Aorta:  Abdominal Aorta: There is evidence of abnormal dilatation of the mid and  distal Abdominal aorta. The largest aortic diameter has increased compared  to prior exam. Previous diameter measurement was 4.7 cm obtained on  02/16/2023.  Maximum diameter was 4.9 cm    MEDICAL ISSUES:   AAA: CT scan from March and ultrasound today showed a 4.9 cm aneurysm.  I discussed that I would consider repair once it becomes greater than 5 cm.  He will return in 3 months for a CT angiogram and further discussions regarding surgery.  I will also get a carotid ultrasound    Malvina Serene CLORE, MD, FACS [facility] [phone] Pager [phone]

## 2023-07-28 ENCOUNTER — Other Ambulatory Visit: Payer: Self-pay

## 2023-07-28 DIAGNOSIS — I6523 Occlusion and stenosis of bilateral carotid arteries: Secondary | ICD-10-CM

## 2023-08-05 ENCOUNTER — Ambulatory Visit: Payer: Medicare PPO | Admitting: Family Medicine

## 2023-08-05 ENCOUNTER — Encounter: Payer: Self-pay | Admitting: Family Medicine

## 2023-08-05 VITALS — BP 133/79 | HR 70 | Ht 70.0 in | Wt 153.0 lb

## 2023-08-05 DIAGNOSIS — Z0001 Encounter for general adult medical examination with abnormal findings: Secondary | ICD-10-CM

## 2023-08-05 DIAGNOSIS — E7800 Pure hypercholesterolemia, unspecified: Secondary | ICD-10-CM | POA: Diagnosis not present

## 2023-08-05 DIAGNOSIS — Z0000 Encounter for general adult medical examination without abnormal findings: Secondary | ICD-10-CM

## 2023-08-05 DIAGNOSIS — I25119 Atherosclerotic heart disease of native coronary artery with unspecified angina pectoris: Secondary | ICD-10-CM | POA: Diagnosis not present

## 2023-08-05 DIAGNOSIS — R634 Abnormal weight loss: Secondary | ICD-10-CM

## 2023-08-05 DIAGNOSIS — S161XXA Strain of muscle, fascia and tendon at neck level, initial encounter: Secondary | ICD-10-CM | POA: Diagnosis not present

## 2023-08-05 DIAGNOSIS — I480 Paroxysmal atrial fibrillation: Secondary | ICD-10-CM

## 2023-08-05 DIAGNOSIS — I119 Hypertensive heart disease without heart failure: Secondary | ICD-10-CM | POA: Diagnosis not present

## 2023-08-05 DIAGNOSIS — C61 Malignant neoplasm of prostate: Secondary | ICD-10-CM | POA: Diagnosis not present

## 2023-08-05 DIAGNOSIS — Z789 Other specified health status: Secondary | ICD-10-CM | POA: Diagnosis not present

## 2023-08-05 NOTE — Progress Notes (Signed)
 BP 133/79   Pulse 70   Ht 5' 10 (1.778 m)   Wt 153 lb (69.4 kg)   SpO2 99%   BMI 21.95 kg/m    Subjective:   Patient ID: Dustin Bauer, male    DOB: 16-Aug-1945, 78 y.o.   MRN: [id number]  HPI: Dustin Bauer is a 78 y.o. male presenting on 08/05/2023 for Medical Management of Chronic Issues (CPE), Atrial Fibrillation, and Hyperlipidemia   HPI Physical exam Patient denies any chest pain, shortness of breath, headaches or vision issues, abdominal complaints, diarrhea, nausea, vomiting, or joint issues.   Hyperlipidemia and CAD and statin intolerance Patient is coming in for recheck of his hyperlipidemia. The patient is currently taking Repatha . They deny any issues with myalgias or history of liver damage from it. They deny any focal numbness or weakness or chest pain.   Hypertension and A-fib Patient is currently on no medicine for blood pressure currently, on Eliquis  for blood thinner, and their blood pressure today is 133/79. Patient denies any lightheadedness or dizziness. Patient denies headaches, blurred vision, chest pains, shortness of breath, or weakness. Denies any side effects from medication and is content with current medication.   Prostate cancer Recently had the radioactive seeds.  Denies any urinary issues.  Will recheck the numbers today.  Neck strain Patient is a left-sided neck strain on the muscles of his upper shoulder and neck that been bothering him over the past 5 days.  He says the use of muscle relaxers and trying to stretch it.  He thinks he may have overdid it while working out and pulled something there but it has been very irritating for him because he cannot turn his head to the left is much as he would like.  Relevant past medical, surgical, family and social history reviewed and updated as indicated. Interim medical history since our last visit reviewed. Allergies and medications reviewed and updated.  Review of Systems  Constitutional:  Negative for  chills and fever.  HENT:  Negative for ear pain and tinnitus.   Eyes:  Negative for pain and visual disturbance.  Respiratory:  Negative for cough, shortness of breath and wheezing.   Cardiovascular:  Negative for chest pain, palpitations and leg swelling.  Gastrointestinal:  Negative for abdominal pain, blood in stool, constipation and diarrhea.  Genitourinary:  Negative for dysuria and hematuria.  Musculoskeletal:  Positive for myalgias and neck pain. Negative for back pain and gait problem.  Skin:  Negative for rash.  Neurological:  Negative for dizziness, weakness and headaches.  Psychiatric/Behavioral:  Negative for suicidal ideas.   All other systems reviewed and are negative.   Per HPI unless specifically indicated above   Allergies as of 08/05/2023       Reactions   Clopidogrel Other (See Comments)   unknown reaction   Crestor [rosuvastatin Calcium] Other (See Comments)   Extreme muscular weakness   Lipitor [atorvastatin Calcium] Other (See Comments)   Extreme muscular weakness   Statins Other (See Comments)   Extreme muscular weakness        Medication List        Accurate as of August 05, 2023  3:21 PM. If you have any questions, ask your nurse or doctor.          STOP taking these medications    losartan  50 MG tablet Commonly known as: COZAAR  Stopped by: Fonda LABOR Jazia Faraci       TAKE these medications    apixaban  5  MG Tabs tablet Commonly known as: Eliquis  Take 1 tablet (5 mg total) by mouth 2 (two) times daily.   B-12-SL 1000 MCG Subl Generic drug: Cyanocobalamin Place 1,000 mcg under the tongue 3 (three) times a week.   clotrimazole-betamethasone  cream Commonly known as: LOTRISONE Apply 1 Application topically 2 (two) times daily as needed (eczema).   CoQ-10 100 MG Caps Take 100 mg by mouth 2 (two) times a week.   Magnesium 400 MG Tabs Take 400 mg by mouth 3 (three) times a week.   Repatha  SureClick 140 MG/ML Soaj Generic drug:  Evolocumab  Inject 140 mg into the skin every 14 (fourteen) days.   sildenafil  20 MG tablet Commonly known as: REVATIO  Take 1-5 tablets (20-100 mg total) by mouth as needed.   Vitamin D3 50 MCG (2000 UT) capsule Take 2,000 Units by mouth 2 (two) times a week.         Objective:   BP 133/79   Pulse 70   Ht 5' 10 (1.778 m)   Wt 153 lb (69.4 kg)   SpO2 99%   BMI 21.95 kg/m   Wt Readings from Last 3 Encounters:  08/05/23 153 lb (69.4 kg)  07/27/23 155 lb (70.3 kg)  07/15/23 158 lb 6.4 oz (71.8 kg)    Physical Exam Vitals and nursing note reviewed.  Constitutional:      General: He is not in acute distress.    Appearance: He is well-developed. He is not diaphoretic.  HENT:     Right Ear: External ear normal.     Left Ear: External ear normal.     Nose: Nose normal.     Mouth/Throat:     Pharynx: No oropharyngeal exudate.  Eyes:     General: No scleral icterus.       Right eye: No discharge.     Conjunctiva/sclera: Conjunctivae normal.     Pupils: Pupils are equal, round, and reactive to light.  Neck:     Thyroid : No thyromegaly.   Cardiovascular:     Rate and Rhythm: Normal rate and regular rhythm.     Heart sounds: Normal heart sounds. No murmur heard. Pulmonary:     Effort: Pulmonary effort is normal. No respiratory distress.     Breath sounds: Normal breath sounds. No wheezing.  Abdominal:     General: Bowel sounds are normal. There is no distension.     Palpations: Abdomen is soft.     Tenderness: There is no abdominal tenderness. There is no guarding or rebound.  Musculoskeletal:        General: Normal range of motion.     Cervical back: Neck supple. Pain with movement and muscular tenderness present. No spinous process tenderness.  Lymphadenopathy:     Cervical: No cervical adenopathy.  Skin:    General: Skin is warm and dry.     Findings: No rash.  Neurological:     Mental Status: He is alert and oriented to person, place, and time.      Coordination: Coordination normal.  Psychiatric:        Behavior: Behavior normal.       Assessment & Plan:   Problem List Items Addressed This Visit       Cardiovascular and Mediastinum   Paroxysmal atrial fibrillation (HCC)   Hypertensive cardiovascular disease   CAD (coronary artery disease)   Relevant Orders   Lipid panel     Genitourinary   Malignant neoplasm of prostate (HCC)   Relevant Orders  PSA, total and free     Other   Hyperlipidemia   Relevant Orders   CBC with Differential/Platelet   CMP14+EGFR   Statin intolerance   Relevant Orders   Lipid panel   Other Visit Diagnoses       Physical exam    -  Primary   Relevant Orders   CBC with Differential/Platelet   CMP14+EGFR   Lipid panel   PSA, total and free     Strain of neck muscle, initial encounter         Weight loss, unintentional       Relevant Orders   TSH     Given the list of stretches and recommended use a TENS unit for his neck.  If still not doing better from there then we can send her to physical therapy.  Will do blood work today on the way out.  Will also add on the thyroid  because he has been losing weight.  Follow up plan: Return in about 6 months (around 02/05/2024), or if symptoms worsen or fail to improve, for A-fib and hypertension and hyperlipidemia.  Counseling provided for all of the vaccine components Orders Placed This Encounter  Procedures   CBC with Differential/Platelet   CMP14+EGFR   Lipid panel   PSA, total and free   TSH    Fonda Levins, MD [facility] 08/05/2023, 3:21 PM

## 2023-08-08 LAB — CMP14+EGFR
ALT: 9 IU/L (ref 0–44)
AST: 15 IU/L (ref 0–40)
Albumin: 4.2 g/dL (ref 3.8–4.8)
Alkaline Phosphatase: 113 IU/L (ref 44–121)
BUN/Creatinine Ratio: 17 (ref 10–24)
BUN: 23 mg/dL (ref 8–27)
Bilirubin Total: 0.5 mg/dL (ref 0.0–1.2)
CO2: 21 mmol/L (ref 20–29)
Calcium: 10.1 mg/dL (ref 8.6–10.2)
Chloride: 100 mmol/L (ref 96–106)
Creatinine, Ser: 1.32 mg/dL — ABNORMAL HIGH (ref 0.76–1.27)
Globulin, Total: 3 g/dL (ref 1.5–4.5)
Glucose: 116 mg/dL — ABNORMAL HIGH (ref 70–99)
Potassium: 5.3 mmol/L — ABNORMAL HIGH (ref 3.5–5.2)
Sodium: 138 mmol/L (ref 134–144)
Total Protein: 7.2 g/dL (ref 6.0–8.5)
eGFR: 55 mL/min/1.73 — ABNORMAL LOW (ref >59)

## 2023-08-08 LAB — CBC WITH DIFFERENTIAL/PLATELET
Basophils Absolute: 0 x10E3/uL (ref 0.0–0.2)
Basos: 0 %
EOS (ABSOLUTE): 0.1 x10E3/uL (ref 0.0–0.4)
Eos: 1 %
Hematocrit: 46.7 % (ref 37.5–51.0)
Hemoglobin: 15.1 g/dL (ref 13.0–17.7)
Immature Grans (Abs): 0.1 x10E3/uL (ref 0.0–0.1)
Immature Granulocytes: 1 %
Lymphocytes Absolute: 1.5 x10E3/uL (ref 0.7–3.1)
Lymphs: 11 %
MCH: 31.6 pg (ref 26.6–33.0)
MCHC: 32.3 g/dL (ref 31.5–35.7)
MCV: 98 fL — ABNORMAL HIGH (ref 79–97)
Monocytes Absolute: 1.5 x10E3/uL — ABNORMAL HIGH (ref 0.1–0.9)
Monocytes: 12 %
Neutrophils Absolute: 10 x10E3/uL — ABNORMAL HIGH (ref 1.4–7.0)
Neutrophils: 75 %
Platelets: 259 x10E3/uL (ref 150–450)
RBC: 4.78 x10E6/uL (ref 4.14–5.80)
RDW: 13.4 % (ref 11.6–15.4)
WBC: 13.2 x10E3/uL — ABNORMAL HIGH (ref 3.4–10.8)

## 2023-08-08 LAB — LIPID PANEL
Chol/HDL Ratio: 1.8 ratio (ref 0.0–5.0)
Cholesterol, Total: 110 mg/dL (ref 100–199)
HDL: 60 mg/dL (ref >39)
LDL Chol Calc (NIH): 36 mg/dL (ref 0–99)
Triglycerides: 66 mg/dL (ref 0–149)
VLDL Cholesterol Cal: 14 mg/dL (ref 5–40)

## 2023-08-08 LAB — PSA, TOTAL AND FREE
PSA, Free: <0.02 ng/mL
Prostate Specific Ag, Serum: <0.1 ng/mL (ref 0.0–4.0)

## 2023-08-08 LAB — TSH: TSH: 1.87 u[IU]/mL (ref 0.450–4.500)

## 2023-08-14 ENCOUNTER — Ambulatory Visit: Payer: Self-pay | Admitting: Family Medicine

## 2023-08-14 ENCOUNTER — Telehealth: Payer: Self-pay

## 2023-08-14 NOTE — Telephone Encounter (Signed)
Refer to lab results.  

## 2023-08-14 NOTE — Telephone Encounter (Signed)
 Copied from CRM [id number]. Topic: Clinical - Lab/Test Results >> Aug 14, 2023  2:02 PM Tobias L wrote: Reason for CRM: Relayed results to patient. No further questions.

## 2023-09-09 DIAGNOSIS — H2513 Age-related nuclear cataract, bilateral: Secondary | ICD-10-CM | POA: Diagnosis not present

## 2023-09-09 DIAGNOSIS — H40013 Open angle with borderline findings, low risk, bilateral: Secondary | ICD-10-CM | POA: Diagnosis not present

## 2023-09-09 DIAGNOSIS — H43813 Vitreous degeneration, bilateral: Secondary | ICD-10-CM | POA: Diagnosis not present

## 2023-09-09 DIAGNOSIS — H04123 Dry eye syndrome of bilateral lacrimal glands: Secondary | ICD-10-CM | POA: Diagnosis not present

## 2023-09-16 ENCOUNTER — Other Ambulatory Visit: Payer: Self-pay

## 2023-09-16 DIAGNOSIS — I7143 Infrarenal abdominal aortic aneurysm, without rupture: Secondary | ICD-10-CM

## 2023-09-17 ENCOUNTER — Encounter: Payer: Self-pay | Admitting: Surgery

## 2023-09-23 ENCOUNTER — Ambulatory Visit
Admission: RE | Admit: 2023-09-23 | Discharge: 2023-09-23 | Disposition: A | Discharge status: Home / Self Care | Source: Ambulatory Visit | Attending: Surgery | Admitting: Surgery

## 2023-09-23 ENCOUNTER — Encounter: Payer: Self-pay | Admitting: Radiology

## 2023-09-23 DIAGNOSIS — I7143 Infrarenal abdominal aortic aneurysm, without rupture: Secondary | ICD-10-CM

## 2023-09-23 DIAGNOSIS — I700 Atherosclerosis of aorta: Secondary | ICD-10-CM | POA: Diagnosis not present

## 2023-09-23 DIAGNOSIS — I719 Aortic aneurysm of unspecified site, without rupture: Secondary | ICD-10-CM | POA: Diagnosis not present

## 2023-09-23 MED ORDER — IOPAMIDOL (ISOVUE-370) INJECTION 76%
100.0000 mL | Freq: Once | INTRAVENOUS | Status: AC | PRN
  Administered 2023-09-23: 100 mL via INTRAVENOUS

## 2023-10-23 ENCOUNTER — Ambulatory Visit (HOSPITAL_COMMUNITY)
Admission: RE | Admit: 2023-10-23 | Discharge: 2023-10-23 | Disposition: A | Discharge status: Home / Self Care | Source: Ambulatory Visit | Attending: Surgery | Admitting: Surgery

## 2023-10-23 DIAGNOSIS — I6523 Occlusion and stenosis of bilateral carotid arteries: Secondary | ICD-10-CM | POA: Insufficient documentation

## 2023-10-26 ENCOUNTER — Encounter: Payer: Self-pay | Admitting: Surgery

## 2023-10-26 ENCOUNTER — Ambulatory Visit: Attending: Surgery | Admitting: Surgery

## 2023-10-26 VITALS — BP 156/93 | HR 60 | Temp 97.9°F | Ht 70.0 in | Wt 158.0 lb

## 2023-10-26 DIAGNOSIS — I7143 Infrarenal abdominal aortic aneurysm, without rupture: Secondary | ICD-10-CM

## 2023-10-26 NOTE — Progress Notes (Signed)
 [facility] [place]  Patient name: Dustin Bauer MRN: [id number] DOB: [birth year] Sex: male   REASON FOR VISIT:    Follow up  HISOTRY OF PRESENT ILLNESS:    Dustin Bauer is a 78 y.o. male who returns today for follow-up of his abdominal aortic aneurysm which was initially detected in 2017 on a CT scan.  He did get a CT scan in March that showed a 4.9 cm aneurysm.  He is back today.  He has no complaints   The patient has undergone brachytherapy for prostate cancer. He is medically managed for hypertension with an ARB. He takes a statin for hypercholesterolemia. He is on Eliquis  for atrial fibrillation. He is a former smoker. He has a history of coronary artery disease status post PCI    PAST MEDICAL HISTORY:   Past Medical History:  Diagnosis Date   AAA (abdominal aortic aneurysm) 01/20/2017   3.9cm infrarenal   Allergy to ACE inhibitors    Cough and fatigue with ACEI   Aortic atherosclerosis 08/18/2017   Noted on CT chest   Atrial fibrillation (HCC)    CAD (coronary artery disease) stent x 1 2011 and 1  2014   a. LHC in 2011 for stable angina >> DES to pLAD [facility])  //  b. inf-post STEMI 7/15 c/b CHB with jxn escape >> LHC: OM1 60-70, pLAD 30-40, m-d RCA occluded, EF 50-55 >> PCI: 3 x 33 mm Xience DES to RCA  //  c. LHC 8/17: dLM 20, pLAD 20, pLAD stent ok, pRI 30, pRCA stent ok with 30% ISR   Colon polyps    Diverticulosis    Emphysema lung (HCC) 08/18/2017   Noted on CT chest   Gastric stenosis 08/27/2016   at pylorus, noted on endoscopy   GERD (gastroesophageal reflux disease)    Hydrocele, bilateral 01/30/2017   Noted on CT chest   Hyperlipidemia    Myalgias with Zocor, Crestor, atorvastatin.    Nicotine abuse    PAF (paroxysmal atrial fibrillation) [facility])    s/p ablation by Dr Kerrin at [facility] in 2012 // Xarelto  anticoagulation  //  ILR removed 2/17 // Event Monitor 11/17: Predominantly NSR (90%).  Paroxysmal atrial fibrillation (10%).  Atrial fibrillation appears to be rate-controlled when it occurs.    Prostate cancer (HCC) dx 2019   Pulmonary nodule, right 08/18/2017   6 mm right upper lobe, 5.5 mm right middle lobe, Noted on CT Chest   Pyloric stenosis    Thoracic ascending aortic aneurysm 01/30/2017   3.9 cm     FAMILY HISTORY:   Family History  Problem Relation Age of Onset   Dementia Mother    Colon cancer Mother    Prostate cancer Mother    Heart disease Father    Hyperlipidemia Father    Pancreatic cancer Sister    Colon cancer Maternal Grandmother    Esophageal cancer Neg Hx    Stomach cancer Neg Hx     SOCIAL HISTORY:   Social History   Tobacco Use   Smoking status: Former    Current packs/day: 0.00    Average packs/day: 0.3 packs/day for 30.0 years (7.5 ttl pk-yrs)    Types: Cigarettes    Start date: 07/20/1973    Quit date: 07/21/2003    Years since quitting: 20.2   Smokeless tobacco: Never   Tobacco comments:    Former smoker 06/18/23  Substance Use Topics   Alcohol use: Yes    Alcohol/week:  1.0 standard drink of alcohol    Types: 1 Glasses of wine per week    Comment: occasional wine or beer     ALLERGIES:   Allergies  Allergen Reactions   Clopidogrel Other (See Comments)    unknown reaction   Crestor [Rosuvastatin Calcium] Other (See Comments)    Extreme muscular weakness   Lipitor [Atorvastatin Calcium] Other (See Comments)    Extreme muscular weakness    Statins Other (See Comments)    Extreme muscular weakness      CURRENT MEDICATIONS:   Current Outpatient Medications  Medication Sig Dispense Refill   apixaban  (ELIQUIS ) 5 MG TABS tablet Take 1 tablet (5 mg total) by mouth 2 (two) times daily. 180 tablet 3   Cholecalciferol (VITAMIN D3) 50 MCG (2000 UT) capsule Take 2,000 Units by mouth 2 (two) times a week.     clotrimazole-betamethasone  (LOTRISONE) cream Apply 1 Application topically 2 (two) times daily as needed (eczema).      Coenzyme Q10 (COQ-10) 100 MG CAPS Take 100 mg by mouth 2 (two) times a week.     Cyanocobalamin (B-12-SL) 1000 MCG SUBL Place 1,000 mcg under the tongue 3 (three) times a week.     Evolocumab  (REPATHA  SURECLICK) 140 MG/ML SOAJ Inject 140 mg into the skin every 14 (fourteen) days. 6 mL 3   Magnesium 400 MG TABS Take 400 mg by mouth 3 (three) times a week.     sildenafil  (REVATIO ) 20 MG tablet Take 1-5 tablets (20-100 mg total) by mouth as needed. 225 tablet 2   No current facility-administered medications for this visit.    REVIEW OF SYSTEMS:   [X]  denotes positive finding, [ ]  denotes negative finding Cardiac  Comments:  Chest pain or chest pressure:    Shortness of breath upon exertion:    Short of breath when lying flat:    Irregular heart rhythm:        Vascular    Pain in calf, thigh, or hip brought on by ambulation:    Pain in feet at night that wakes you up from your sleep:     Blood clot in your veins:    Leg swelling:         Pulmonary    Oxygen at home:    Productive cough:     Wheezing:         Neurologic    Sudden weakness in arms or legs:     Sudden numbness in arms or legs:     Sudden onset of difficulty speaking or slurred speech:    Temporary loss of vision in one eye:     Problems with dizziness:         Gastrointestinal    Blood in stool:     Vomited blood:         Genitourinary    Burning when urinating:     Blood in urine:        Psychiatric    Major depression:         Hematologic    Bleeding problems:    Problems with blood clotting too easily:        Skin    Rashes or ulcers:        Constitutional    Fever or chills:      PHYSICAL EXAM:   There were no vitals filed for this visit.  GENERAL: The patient is a well-nourished male, in no acute distress. The vital signs are documented above. CARDIAC: There  is a regular rate and rhythm.  PULMONARY: Non-labored respirations ABDOMEN: Soft and non-tender with  MUSCULOSKELETAL: There  are no major deformities or cyanosis. NEUROLOGIC: No focal weakness or paresthesias are detected. SKIN: There are no ulcers or rashes noted. PSYCHIATRIC: The patient has a normal affect.  STUDIES:   I have reviewed the following:  CTA: 1. Ectatic thoracic aorta without aneurysm or dissection. Similar appearance of the fusiform infrarenal aortic aneurysm, measuring 4.7 x 4.9 x 9.4 cm (APxTRxCC), which contains a large amount of mural thrombus. No retroperitoneal hematoma or adjacent inflammation to suggest impending rupture. 2. Moderate diffuse bronchial wall thickening, which may represent changes of acute or chronic bronchitis or asthma. No pneumonia, pulmonary edema, or pleural effusion. 3. No acute abnormality within the abdomen or pelvis.  Carotid duplex: Right Carotid: Velocities in the right ICA are consistent with a 1-39%  stenosis.                The ECA appears >50% stenosed.   Left Carotid: Velocities in the left ICA are consistent with a 1-39%  stenosis.   Vertebrals: Bilateral vertebral arteries demonstrate antegrade flow.  Subclavians: Normal flow hemodynamics were seen in the right subclavian  artery.   ABI:  Right Carotid: Velocities in the right ICA are consistent with a 1-39%  stenosis.                The ECA appears >50% stenosed.   Left Carotid: Velocities in the left ICA are consistent with a 1-39%  stenosis.   Vertebrals: Bilateral vertebral arteries demonstrate antegrade flow.  Subclavians: Normal flow hemodynamics were seen in the right subclavian  artery.  MEDICAL ISSUES:   AAA: Maximal aortic diameter is 4.9 cm.  I discussed elective repair once he is greater than 5 cm, however I am also concerned about his conical neck.  He may need a Z fashion to address this.  If that is the case, I would delay treatment until he gets a little bigger than 5 cm.  He will return in 6 months for surveillance imaging with a CT scan.    Malvina Serene CLORE, MD,  FACS [facility] [phone] Pager [phone]

## 2023-11-02 ENCOUNTER — Encounter (HOSPITAL_COMMUNITY)

## 2023-11-02 ENCOUNTER — Ambulatory Visit: Admitting: Surgery

## 2023-12-22 DIAGNOSIS — L814 Other melanin hyperpigmentation: Secondary | ICD-10-CM | POA: Diagnosis not present

## 2023-12-22 DIAGNOSIS — Z85828 Personal history of other malignant neoplasm of skin: Secondary | ICD-10-CM | POA: Diagnosis not present

## 2023-12-22 DIAGNOSIS — D225 Melanocytic nevi of trunk: Secondary | ICD-10-CM | POA: Diagnosis not present

## 2023-12-22 DIAGNOSIS — L821 Other seborrheic keratosis: Secondary | ICD-10-CM | POA: Diagnosis not present

## 2023-12-22 DIAGNOSIS — D235 Other benign neoplasm of skin of trunk: Secondary | ICD-10-CM | POA: Diagnosis not present

## 2023-12-22 DIAGNOSIS — L570 Actinic keratosis: Secondary | ICD-10-CM | POA: Diagnosis not present

## 2023-12-22 DIAGNOSIS — L579 Skin changes due to chronic exposure to nonionizing radiation, unspecified: Secondary | ICD-10-CM | POA: Diagnosis not present

## 2024-01-27 ENCOUNTER — Ambulatory Visit (HOSPITAL_COMMUNITY): Admitting: Internal Medicine

## 2024-01-28 ENCOUNTER — Other Ambulatory Visit: Payer: Self-pay | Admitting: Family Medicine

## 2024-01-28 DIAGNOSIS — I480 Paroxysmal atrial fibrillation: Secondary | ICD-10-CM

## 2024-02-03 ENCOUNTER — Ambulatory Visit (HOSPITAL_COMMUNITY)
Admission: RE | Admit: 2024-02-03 | Discharge: 2024-02-03 | Disposition: A | Discharge status: Home / Self Care | Source: Ambulatory Visit | Attending: Internal Medicine | Admitting: Internal Medicine

## 2024-02-03 ENCOUNTER — Encounter (HOSPITAL_COMMUNITY): Payer: Self-pay | Admitting: Internal Medicine

## 2024-02-03 VITALS — BP 136/92 | HR 52 | Ht 70.0 in | Wt 163.2 lb

## 2024-02-03 DIAGNOSIS — I480 Paroxysmal atrial fibrillation: Secondary | ICD-10-CM | POA: Diagnosis not present

## 2024-02-03 DIAGNOSIS — D6869 Other thrombophilia: Secondary | ICD-10-CM | POA: Diagnosis not present

## 2024-02-03 NOTE — Progress Notes (Signed)
 "   Primary Care Physician: Dettinger, Fonda LABOR, MD Primary Cardiologist: Lynwood Schilling, MD Electrophysiologist: Eulas FORBES Furbish, MD  Referring Physician: Dr. Furbish [name] is a 79 y.o. male with a history of CAD s/p inferior wall MI and stent, HTN, abdominal aortic aneurysm, chronic ischemic heart disease, pyloric stricture, HLD, and paroxysmal atrial fibrillation who presents for follow up in the [facility]. History of ablation at [facility] in 2012. He is now s/p Afib ablation on 08/14/22 by Dr. Furbish. Patient contacted office noting to be in Afib since 8/9. Patient is on Eliquis  5 mg BID for a CHADS2VASC score of 4.  On evaluation today, he is currently in Afib with slow ventricular response. Patient admits that on 8/9 when he went into Afib he maybe walked for too long outside in the hot weather and overexerted himself. He has been in Afib since then, confirmed with Apple Watch. No missed doses of Eliquis . He does not have CP or trouble swallowing. He has SOB with activity. Leg sites healed without issue.   On follow up 09/11/22, he is currently in NSR. He was scheduled for cardioversion on 8/20 after last OV but procedure was canceled due to him being in normal rhythm. Since then, he notes to have episodes of Afib after he goes on a hike with his dogs. He was in Afib this past week until today when he converted to NSR. He wears an Apple watch for monitoring. He notes to have an overall lower HR ever since his MI. No missed doses of Eliquis .  Follow up 06/18/23. Patient reports that his smart watch has shown a 12% afib burden recently. He does report occasional fatigue and dizziness. He denies presyncope or falls. No bleeding issues on anticoagulation.   On follow up 07/15/23, patient is currently in NSR. Cardiac monitor placed after last visit due to reported Afib burden on smart watch. Monitor showed predominantly sinus rhythm with 11.5% atrial ectopy. No missed  doses of anticoagulation.  Follow up 02/03/24. Patient is currently in NSR. He has noted overall no Afib burden since last office visit confirmed with Apple watch. He has remained physically active and exercises several days per week. No bleeding issues on Eliquis  but he does note easy bruising.   Today, he denies symptoms of palpitations, chest pain, shortness of breath, orthopnea, PND, lower extremity edema, dizziness, presyncope, syncope, snoring, daytime somnolence, bleeding, or neurologic sequela. The patient is tolerating medications without difficulties and is otherwise without complaint today.    he has a BMI of Body mass index is 23.42 kg/m.SABRA Filed Weights   02/03/24 1446  Weight: 74 kg    Current Outpatient Medications  Medication Sig Dispense Refill   apixaban  (ELIQUIS ) 5 MG TABS tablet TAKE 1 TABLET BY MOUTH TWICE A DAY 180 tablet 0   Cholecalciferol (VITAMIN D3) 50 MCG (2000 UT) capsule Take 2,000 Units by mouth 2 (two) times a week.     clotrimazole-betamethasone  (LOTRISONE) cream Apply 1 Application topically 2 (two) times daily as needed (eczema).     Coenzyme Q10 (COQ-10) 100 MG CAPS Take 100 mg by mouth 2 (two) times a week.     Cyanocobalamin (B-12-SL) 1000 MCG SUBL Place 1,000 mcg under the tongue 3 (three) times a week.     Evolocumab  (REPATHA  SURECLICK) 140 MG/ML SOAJ Inject 140 mg into the skin every 14 (fourteen) days. 6 mL 3   Magnesium 400 MG TABS Take 400 mg  by mouth 3 (three) times a week.     sildenafil  (REVATIO ) 20 MG tablet Take 1-5 tablets (20-100 mg total) by mouth as needed. 225 tablet 2   No current facility-administered medications for this encounter.    Atrial Fibrillation Management history:  Previous antiarrhythmic drugs: None Previous cardioversions: none Previous ablations: 2012, 08/14/22 Anticoagulation history: Eliquis    ROS- All systems are reviewed and negative except as per the HPI above.  Physical Exam: BP (!) 136/92   Pulse (!) 52    Ht 5' 10 (1.778 m)   Wt 74 kg   BMI 23.42 kg/m   GEN- The patient is well appearing, alert and oriented x 3 today.   Neck - no JVD or carotid bruit noted Lungs- Clear to ausculation bilaterally, normal work of breathing Heart- Regular rate and rhythm, no murmurs, rubs or gallops, PMI not laterally displaced Extremities- no clubbing, cyanosis, or edema Skin - no rash or ecchymosis noted   EKG today demonstrates  EKG Interpretation Date/Time:  Wednesday February 03 2024 14:49:42 EST Ventricular Rate:  52 PR Interval:  272 QRS Duration:  78 QT Interval:  430 QTC Calculation: 399 R Axis:   71  Text Interpretation: Sinus bradycardia with 1st degree A-V block with Premature atrial complexes in a pattern of bigeminy Nonspecific T wave abnormality Abnormal ECG When compared with ECG of 15-Jul-2023 13:28, No significant change was found Confirmed by Terra Pac ([id number]) on 02/03/2024 3:01:52 PM     Echo 12/15/2013 demonstrated  Study Conclusions  - Left ventricle: The cavity size was normal. Wall thickness was    increased in a pattern of mild LVH. Systolic function was normal.    The estimated ejection fraction was in the range of 60% to 65%.    Wall motion was normal; there were no regional wall motion    abnormalities. Doppler parameters are consistent with abnormal    left ventricular relaxation (grade 1 diastolic dysfunction). The    E/e&' ratio is <8, suggesting normal LV filling pressure.  - Mitral valve: Mildly thickened leaflets .  - Left atrium: The atrium was at the upper limits of normal in    size.  - Right atrium: The atrium was mildly dilated.   Cardiac monitor 06/2023: Predominant rhythm: Sinus Sinus HR: 48 - 110 bpm, AVG 57 bpm   11.5% atrial ectopy <1%  ventricular ectopy   Arrhythmia detected: Frequent SVT episodes, longest duration 28.2 seconds. Rhythm appears most consistent with atrial tachycardia   Patient triggered events: single patient triggered  episode correlated with an isolated PVC  CHA2DS2-VASc Score = 4  The patient's score is based upon: CHF History: 0 HTN History: 1 Diabetes History: 0 Stroke History: 0 Vascular Disease History: 1 Age Score: 2 Gender Score: 0       ASSESSMENT AND PLAN: Paroxysmal Atrial Fibrillation/atrial flutter The patient's CHA2DS2-VASc score is 4, indicating a 4.8% annual risk of stroke.   S/p ablation 2012 [facility]) and 08/14/22 with Dr. Nancey.  Patient is currently in NSR. He is happy with overall management so we will continue conservative observation at this time.  Secondary Hypercoagulable State (ICD10:  D68.69) The patient is at significant risk for stroke/thromboembolism based upon his CHA2DS2-VASc Score of 4.  Continue Apixaban  (Eliquis ).  No bleeding issues.   CAD STEMI 2015 Followed by Dr Lavona No anginal symptoms today.   HTN Stable today.   Mobitz I 2nd degree AV block Bradycardia is longstanding but block is new. Patient not on any  AV nodal agents - he is not in 2nd degree block today but back in 1st degree. No progression of higher grade AV block on cardiac monitor. Will continue to monitor.    Follow up 6 months Afib clinic.   Dorn Heinrich, [facility] [address] [place], [place] [address] [phone]  "

## 2024-02-05 ENCOUNTER — Ambulatory Visit: Payer: Self-pay | Admitting: Family Medicine

## 2024-02-05 ENCOUNTER — Encounter: Payer: Self-pay | Admitting: Family Medicine

## 2024-02-05 VITALS — BP 149/73 | HR 55 | Ht 70.0 in | Wt 162.0 lb

## 2024-02-05 DIAGNOSIS — I25119 Atherosclerotic heart disease of native coronary artery with unspecified angina pectoris: Secondary | ICD-10-CM | POA: Diagnosis not present

## 2024-02-05 DIAGNOSIS — I119 Hypertensive heart disease without heart failure: Secondary | ICD-10-CM

## 2024-02-05 DIAGNOSIS — Z789 Other specified health status: Secondary | ICD-10-CM | POA: Diagnosis not present

## 2024-02-05 DIAGNOSIS — I480 Paroxysmal atrial fibrillation: Secondary | ICD-10-CM

## 2024-02-05 DIAGNOSIS — E7800 Pure hypercholesterolemia, unspecified: Secondary | ICD-10-CM | POA: Diagnosis not present

## 2024-02-05 MED ORDER — APIXABAN 5 MG PO TABS: 5.0000 mg | ORAL_TABLET | Freq: Two times a day (BID) | ORAL | 3 refills | Status: AC

## 2024-02-05 NOTE — Progress Notes (Signed)
 "  BP (!) 149/73   Pulse (!) 55   Ht 5' 10 (1.778 m)   Wt 162 lb (73.5 kg)   SpO2 99%   BMI 23.24 kg/m    Subjective:   Patient ID: Dustin Bauer, male    DOB: 31-Aug-1945, 79 y.o.   MRN: [id number]  HPI: Dustin Bauer is a 79 y.o. male presenting on 02/05/2024 for Medical Management of Chronic Issues, Hyperlipidemia, and Hypertension   Discussed the use of AI scribe software for clinical note transcription with the patient, who gave verbal consent to proceed.  History of Present Illness   Dustin Bauer is a 79 year old male with hypertension and atrial fibrillation who presents for a recheck of his blood pressure and medication management.  Hypertension - Blood pressure at recent atrial clinic visit measured 132/20; today measures 149/73. - Currently taking 25 mg antihypertensive medication once daily, typically at night. - Monitors blood pressure at home but is unsure of the accuracy of his machine.  Atrial fibrillation - History of atrial fibrillation; underwent second ablation in summer 2025. - Initial post-ablation period was ineffective, but returned to sinus rhythm after several weeks. - Able to identify episodes of atrial fibrillation by experiencing exhaustion during hikes or gym sessions. - Requires rest and heart rate reduction before resuming activity during episodes. - Uses Apple Watch to monitor heart rate during exercise.  Cutaneous symptoms - Experiences winter eczema. - Uses a compounded cream from [place] downtown pharmacy, believed to contain a steroid. - Has not used the cream much recently and is unsure of its exact name.  Lipid management - Currently receiving Repatha  injections. - Satisfied with treatment. [employer] questioned necessity due to improved lipid numbers.  General symptoms and activity - Active lifestyle; walks dogs and attends gym a couple of times per week. - No pain. - Some congestion.          Relevant past medical, surgical,  family and social history reviewed and updated as indicated. Interim medical history since our last visit reviewed. Allergies and medications reviewed and updated.  Review of Systems  Constitutional:  Negative for chills and fever.  Eyes:  Negative for visual disturbance.  Respiratory:  Negative for shortness of breath and wheezing.   Cardiovascular:  Negative for chest pain and leg swelling.  Musculoskeletal:  Negative for back pain and gait problem.  Skin:  Negative for rash.  Neurological:  Negative for dizziness and numbness.  All other systems reviewed and are negative.   Per HPI unless specifically indicated above   Allergies as of 02/05/2024       Reactions   Clopidogrel Other (See Comments)   unknown reaction   Crestor [rosuvastatin Calcium] Other (See Comments)   Extreme muscular weakness   Lipitor [atorvastatin Calcium] Other (See Comments)   Extreme muscular weakness   Statins Other (See Comments)   Extreme muscular weakness        Medication List        Accurate as of February 05, 2024  3:00 PM. If you have any questions, ask your nurse or doctor.          apixaban  5 MG Tabs tablet Commonly known as: Eliquis  Take 1 tablet (5 mg total) by mouth 2 (two) times daily.   B-12-SL 1000 MCG Subl Generic drug: Cyanocobalamin Place 1,000 mcg under the tongue 3 (three) times a week.   clotrimazole-betamethasone  cream Commonly known as: LOTRISONE Apply 1 Application topically 2 (two)  times daily as needed (eczema).   CoQ-10 100 MG Caps Take 100 mg by mouth 2 (two) times a week.   Magnesium 400 MG Tabs Take 400 mg by mouth 3 (three) times a week.   Repatha  SureClick 140 MG/ML Soaj Generic drug: Evolocumab  Inject 140 mg into the skin every 14 (fourteen) days.   sildenafil  20 MG tablet Commonly known as: REVATIO  Take 1-5 tablets (20-100 mg total) by mouth as needed.   Vitamin D3 50 MCG (2000 UT) capsule Take 2,000 Units by mouth 2 (two) times a week.          Objective:   BP (!) 149/73   Pulse (!) 55   Ht 5' 10 (1.778 m)   Wt 162 lb (73.5 kg)   SpO2 99%   BMI 23.24 kg/m   Wt Readings from Last 3 Encounters:  02/05/24 162 lb (73.5 kg)  02/03/24 163 lb 3.2 oz (74 kg)  10/26/23 158 lb (71.7 kg)    Physical Exam Physical Exam   VITALS: BP- 149/73 CHEST: Lungs clear to auscultation. CARDIOVASCULAR: Heart sounds regular.         Assessment & Plan:   Problem List Items Addressed This Visit       Cardiovascular and Mediastinum   Paroxysmal atrial fibrillation (HCC)   Relevant Medications   apixaban  (ELIQUIS ) 5 MG TABS tablet   Other Relevant Orders   CBC With Diff/Platelet   CMP14+EGFR   Lipid panel   Hypertensive cardiovascular disease   Relevant Medications   apixaban  (ELIQUIS ) 5 MG TABS tablet   Other Relevant Orders   CBC With Diff/Platelet   CMP14+EGFR   Lipid panel   CAD (coronary artery disease)   Relevant Medications   apixaban  (ELIQUIS ) 5 MG TABS tablet   Other Relevant Orders   CBC With Diff/Platelet   CMP14+EGFR   Lipid panel     Other   Hyperlipidemia - Primary   Relevant Medications   apixaban  (ELIQUIS ) 5 MG TABS tablet   Other Relevant Orders   CBC With Diff/Platelet   CMP14+EGFR   Lipid panel   Statin intolerance   Relevant Orders   CBC With Diff/Platelet   CMP14+EGFR   Lipid panel        Hypertensive heart disease without heart failure Blood pressure mildly elevated. Current medication is 25 mg of antihypertensive once daily. - Monitor blood pressure daily for two weeks. - Report if blood pressure consistently exceeds 145-150 mmHg. - Continue current antihypertensive regimen unless blood pressure remains elevated.  Paroxysmal atrial fibrillation Recent recurrence despite second ablation. Currently in sinus rhythm. Monitors heart rate with Apple Watch. - Continue monitoring heart rate with Apple Watch.  Pure hypercholesterolemia Continues on Repatha  injections. Insurance  questioned necessity due to good cholesterol levels attributed to medication. - Continue Repatha  injections. - Checked cholesterol levels with blood work.  Eczema Experiences winter eczema. Previously prescribed a compounded cream with a steroid. - Upload picture of the cream to MyChart for identification.          Follow up plan: Return in about 6 months (around 08/04/2024), or if symptoms worsen or fail to improve, for Physical exam and recheck hypertension and hyperlipidemia.  Counseling provided for all of the vaccine components Orders Placed This Encounter  Procedures   CBC With Diff/Platelet   CMP14+EGFR   Lipid panel    Fonda Levins, MD [facility] 02/05/2024, 3:00 PM     "

## 2024-02-06 LAB — CBC WITH DIFF/PLATELET
Basophils Absolute: 0.1 10*3/uL (ref 0.0–0.2)
Basos: 1 %
EOS (ABSOLUTE): 0.4 10*3/uL (ref 0.0–0.4)
Eos: 5 %
Hematocrit: 43.6 % (ref 37.5–51.0)
Hemoglobin: 13.7 g/dL (ref 13.0–17.7)
Immature Grans (Abs): 0 10*3/uL (ref 0.0–0.1)
Immature Granulocytes: 0 %
Lymphocytes Absolute: 2.1 10*3/uL (ref 0.7–3.1)
Lymphs: 28 %
MCH: 30 pg (ref 26.6–33.0)
MCHC: 31.4 g/dL — ABNORMAL LOW (ref 31.5–35.7)
MCV: 96 fL (ref 79–97)
Monocytes Absolute: 0.7 10*3/uL (ref 0.1–0.9)
Monocytes: 9 %
Neutrophils Absolute: 4.3 10*3/uL (ref 1.4–7.0)
Neutrophils: 57 %
Platelets: 207 10*3/uL (ref 150–450)
RBC: 4.56 x10E6/uL (ref 4.14–5.80)
RDW: 14 % (ref 11.6–15.4)
WBC: 7.5 10*3/uL (ref 3.4–10.8)

## 2024-02-06 LAB — CMP14+EGFR
ALT: 18 [IU]/L (ref 0–44)
AST: 20 [IU]/L (ref 0–40)
Albumin: 4.1 g/dL (ref 3.8–4.8)
Alkaline Phosphatase: 113 [IU]/L (ref 47–123)
BUN/Creatinine Ratio: 14 (ref 10–24)
BUN: 20 mg/dL (ref 8–27)
Bilirubin Total: 0.3 mg/dL (ref 0.0–1.2)
CO2: 24 mmol/L (ref 20–29)
Calcium: 9.9 mg/dL (ref 8.6–10.2)
Chloride: 106 mmol/L (ref 96–106)
Creatinine, Ser: 1.4 mg/dL — ABNORMAL HIGH (ref 0.76–1.27)
Globulin, Total: 2.4 g/dL (ref 1.5–4.5)
Glucose: 95 mg/dL (ref 70–99)
Potassium: 4.8 mmol/L (ref 3.5–5.2)
Sodium: 144 mmol/L (ref 134–144)
Total Protein: 6.5 g/dL (ref 6.0–8.5)
eGFR: 51 mL/min/{1.73_m2} — ABNORMAL LOW

## 2024-02-06 LAB — LIPID PANEL
Chol/HDL Ratio: 2 ratio (ref 0.0–5.0)
Cholesterol, Total: 116 mg/dL (ref 100–199)
HDL: 57 mg/dL
LDL Chol Calc (NIH): 42 mg/dL (ref 0–99)
Triglycerides: 85 mg/dL (ref 0–149)
VLDL Cholesterol Cal: 17 mg/dL (ref 5–40)

## 2024-02-11 ENCOUNTER — Ambulatory Visit: Payer: Self-pay | Admitting: Family Medicine

## 2024-04-25 ENCOUNTER — Ambulatory Visit: Admitting: Surgery

## 2024-08-09 ENCOUNTER — Ambulatory Visit (HOSPITAL_COMMUNITY): Admitting: Internal Medicine

## 2024-08-17 ENCOUNTER — Encounter: Admitting: Family Medicine
